# Patient Record
Sex: Female | Born: 1985
Health system: Southern US, Community
[De-identification: ages and names within clinical notes are randomized; demographics above are authoritative.]

## PROBLEM LIST (undated history)

## (undated) DIAGNOSIS — K219 Gastro-esophageal reflux disease without esophagitis: Secondary | ICD-10-CM

## (undated) DIAGNOSIS — E7800 Pure hypercholesterolemia, unspecified: Secondary | ICD-10-CM

## (undated) DIAGNOSIS — A0472 Enterocolitis due to Clostridium difficile, not specified as recurrent: Secondary | ICD-10-CM

## (undated) DIAGNOSIS — Z87442 Personal history of urinary calculi: Secondary | ICD-10-CM

## (undated) DIAGNOSIS — R519 Headache, unspecified: Secondary | ICD-10-CM

## (undated) DIAGNOSIS — I10 Essential (primary) hypertension: Secondary | ICD-10-CM

## (undated) DIAGNOSIS — R634 Abnormal weight loss: Secondary | ICD-10-CM

## (undated) DIAGNOSIS — F909 Attention-deficit hyperactivity disorder, unspecified type: Secondary | ICD-10-CM

## (undated) DIAGNOSIS — B029 Zoster without complications: Secondary | ICD-10-CM

## (undated) DIAGNOSIS — F419 Anxiety disorder, unspecified: Secondary | ICD-10-CM

## (undated) DIAGNOSIS — F32A Depression, unspecified: Secondary | ICD-10-CM

## (undated) HISTORY — DX: Headache, unspecified: R51.9

## (undated) HISTORY — DX: Abnormal weight loss: R63.4

---

## 2004-07-10 ENCOUNTER — Ambulatory Visit: Payer: Self-pay | Admitting: Obstetrics and Gynecology

## 2004-09-13 ENCOUNTER — Ambulatory Visit: Payer: Self-pay

## 2004-12-09 ENCOUNTER — Ambulatory Visit: Payer: Self-pay | Admitting: Family Medicine

## 2004-12-24 ENCOUNTER — Ambulatory Visit: Payer: Self-pay | Admitting: Pain Medicine

## 2007-03-23 ENCOUNTER — Ambulatory Visit: Payer: Self-pay | Admitting: Pain Medicine

## 2007-04-08 ENCOUNTER — Ambulatory Visit: Payer: Self-pay | Admitting: Physician Assistant

## 2007-04-22 ENCOUNTER — Ambulatory Visit: Payer: Self-pay | Admitting: Physician Assistant

## 2007-05-25 ENCOUNTER — Ambulatory Visit: Payer: Self-pay | Admitting: Physician Assistant

## 2009-02-08 ENCOUNTER — Ambulatory Visit: Payer: Self-pay | Admitting: Family Medicine

## 2013-03-17 ENCOUNTER — Emergency Department: Payer: Self-pay | Admitting: Emergency Medicine

## 2013-11-21 ENCOUNTER — Ambulatory Visit: Payer: Self-pay | Admitting: Unknown Physician Specialty

## 2014-08-15 ENCOUNTER — Ambulatory Visit: Payer: Self-pay | Admitting: Family Medicine

## 2015-01-29 ENCOUNTER — Other Ambulatory Visit: Payer: Self-pay | Admitting: Family Medicine

## 2015-05-30 ENCOUNTER — Encounter: Payer: Self-pay | Admitting: Emergency Medicine

## 2015-05-30 ENCOUNTER — Ambulatory Visit
Admission: EM | Admit: 2015-05-30 | Discharge: 2015-05-30 | Disposition: A | Discharge status: Home / Self Care | Payer: BLUE CROSS/BLUE SHIELD | Attending: Family Medicine | Admitting: Family Medicine

## 2015-05-30 DIAGNOSIS — M545 Low back pain, unspecified: Secondary | ICD-10-CM

## 2015-05-30 DIAGNOSIS — M461 Sacroiliitis, not elsewhere classified: Secondary | ICD-10-CM | POA: Diagnosis not present

## 2015-05-30 MED ORDER — KETOROLAC TROMETHAMINE 60 MG/2ML IM SOLN
60.0000 mg | Freq: Once | INTRAMUSCULAR | Status: AC
  Administered 2015-05-30: 60 mg via INTRAMUSCULAR

## 2015-05-30 MED ORDER — HYDROCODONE-ACETAMINOPHEN 5-325 MG PO TABS: ORAL_TABLET | ORAL | Status: DC

## 2015-05-30 MED ORDER — DIAZEPAM 2 MG PO TABS: ORAL_TABLET | ORAL | Status: DC

## 2015-05-30 NOTE — ED Notes (Signed)
Patient states that she "threw her back out"  Yesterday while blowing her nose.  Patient c/o lower back pain on her left side.

## 2015-05-30 NOTE — ED Provider Notes (Signed)
CSN: [id number]     Arrival date & time 05/30/15  [id number] History   None    Chief Complaint  Patient presents with  . Back Pain   (Consider location/radiation/quality/duration/timing/severity/associated sxs/prior Treatment) HPI Comments: 30 yo female with a h/o intermittent low back pain and sacroiliitis presents with a 1 day h/o acute onset of left low back pain that started while blowing her nose very hard yesterday. Patient has had similar acute episodes in the past causing her flare ups. States pain radiates down the left buttock area. Denies any saddle anesthesia, bowel or bladder problems.   Patient is a 30 y.o. female presenting with back pain. The history is provided by the patient.  Back Pain   History reviewed. No pertinent past medical history. History reviewed. No pertinent past surgical history. History reviewed. No pertinent family history. Social History  Substance Use Topics  . Smoking status: Never Smoker   . Smokeless tobacco: Never Used  . Alcohol Use: No   OB History    No data available     Review of Systems  Musculoskeletal: Positive for back pain.    Allergies  Shellfish allergy  Home Medications   Prior to Admission medications   Medication Sig Start Date End Date Taking? Authorizing Provider  amphetamine-dextroamphetamine (ADDERALL XR) 20 MG 24 hr capsule Take 20 mg by mouth daily.   Yes Historical Provider, MD  diazepam (VALIUM) 2 MG tablet 1 tab po q 8 hours prn 05/30/15   Norval Gable, MD  HYDROcodone-acetaminophen (NORCO/VICODIN) 5-325 MG tablet 1-2 tabs po q 8 hours prn 05/30/15   Norval Gable, MD  NUVARING 0.12-0.015 MG/24HR vaginal ring AS DIRECTED BY DOCTOR 01/29/15   Juline Patch, MD   Meds Ordered and Administered this Visit   Medications  ketorolac (TORADOL) injection 60 mg (60 mg Intramuscular Given 05/30/15 1042)    BP 144/97 mmHg  Pulse 120  Temp(Src) 99 F (37.2 C) (Tympanic)  Resp 16  Ht 5\' 11"  (1.803 m)  Wt 200 lb (90.719  kg)  BMI 27.91 kg/m2  SpO2 97% No data found.   Physical Exam  Constitutional: She appears well-developed and well-nourished. No distress.  Musculoskeletal: She exhibits tenderness. She exhibits no edema.       Lumbar back: She exhibits tenderness (over the left lumbar sacral paraspinous muscles, left buttock and left SI joint) and spasm. She exhibits normal range of motion, no bony tenderness, no swelling, no edema, no deformity, no laceration, no pain and normal pulse.  Neurological: She is alert. She has normal reflexes. She exhibits normal muscle tone.  Skin: Skin is warm and dry. No rash noted. She is not diaphoretic. No erythema.  Nursing note and vitals reviewed.   ED Course  Procedures (including critical care time)  Labs Review Labs Reviewed - No data to display  Imaging Review No results found.   Visual Acuity Review  Right Eye Distance:   Left Eye Distance:   Bilateral Distance:    Right Eye Near:   Left Eye Near:    Bilateral Near:         MDM   1. Left-sided low back pain without sciatica   2. Sacroiliitis [facility])    Discharge Medication List as of 05/30/2015 11:01 AM    START taking these medications   Details  diazepam (VALIUM) 2 MG tablet 1 tab po q 8 hours prn, Print    HYDROcodone-acetaminophen (NORCO/VICODIN) 5-325 MG tablet 1-2 tabs po q 8 hours prn, Print  1. diagnosis reviewed with patient 2. rx as per orders above; reviewed possible side effects, interactions, risks and benefits; patient also to take prednisone rx  she has at home 3. Recommend supportive treatment with gentle stretches, heat 4. Follow-up prn if symptoms worsen or don't improve    Norval Gable, MD 05/30/15 1104

## 2015-06-28 ENCOUNTER — Encounter: Payer: Self-pay | Admitting: Family Medicine

## 2015-06-28 ENCOUNTER — Ambulatory Visit (INDEPENDENT_AMBULATORY_CARE_PROVIDER_SITE_OTHER): Payer: BLUE CROSS/BLUE SHIELD | Admitting: Family Medicine

## 2015-06-28 VITALS — BP 120/80 | HR 72

## 2015-06-28 DIAGNOSIS — M5417 Radiculopathy, lumbosacral region: Secondary | ICD-10-CM | POA: Diagnosis not present

## 2015-06-28 NOTE — Progress Notes (Signed)
Name: Molly Cisneros   MRN: [id number]    DOB: 11/18/1985   Date:06/28/2015       Progress Note  Subjective  Chief Complaint  No chief complaint on file.   Back Pain This is a recurrent problem. The current episode started more than 1 month ago (slipped on ice 05/28/15). The problem occurs constantly. The problem has been gradually worsening since onset. The pain is present in the lumbar spine and sacro-iliac. The quality of the pain is described as aching. The pain radiates to the left foot, left knee and left thigh. The pain is at a severity of 8/10. The pain is moderate. The pain is the same all the time. The symptoms are aggravated by bending, coughing, position, sitting and twisting. Associated symptoms include tingling. Pertinent negatives include no abdominal pain, bladder incontinence, bowel incontinence, chest pain, dysuria, fever, headaches, numbness, paresis, weakness or weight loss. She has tried analgesics, muscle relaxant and NSAIDs for the symptoms. The treatment provided no relief.    No problem-specific assessment & plan notes found for this encounter.   No past medical history on file.  No past surgical history on file.  No family history on file.  Social History   Social History  . Marital Status: Single    Spouse Name: N/A  . Number of Children: N/A  . Years of Education: N/A   Occupational History  . Not on file.   Social History Main Topics  . Smoking status: Never Smoker   . Smokeless tobacco: Never Used  . Alcohol Use: No  . Drug Use: Not on file  . Sexual Activity: Not on file   Other Topics Concern  . Not on file   Social History Narrative    Allergies  Allergen Reactions  . Shellfish Allergy Anaphylaxis     Review of Systems  Constitutional: Negative for fever, chills, weight loss and malaise/fatigue.  HENT: Negative for ear discharge, ear pain and sore throat.   Eyes: Negative for blurred vision.  Respiratory: Negative for cough,  sputum production, shortness of breath and wheezing.   Cardiovascular: Negative for chest pain, palpitations and leg swelling.  Gastrointestinal: Negative for heartburn, nausea, abdominal pain, diarrhea, constipation, blood in stool, melena and bowel incontinence.  Genitourinary: Negative for bladder incontinence, dysuria, urgency, frequency and hematuria.  Musculoskeletal: Positive for back pain and falls. Negative for myalgias, joint pain and neck pain.  Skin: Negative for rash.  Neurological: Positive for tingling. Negative for dizziness, sensory change, focal weakness, weakness, numbness and headaches.  Endo/Heme/Allergies: Negative for environmental allergies and polydipsia. Does not bruise/bleed easily.  Psychiatric/Behavioral: Negative for depression and suicidal ideas. The patient is not nervous/anxious and does not have insomnia.      Objective  Filed Vitals:   06/28/15 0805  BP: 120/80  Pulse: 72    Physical Exam  Constitutional: She is well-developed, well-nourished, and in no distress. No distress.  HENT:  Head: Normocephalic and atraumatic.  Right Ear: External ear normal.  Left Ear: External ear normal.  Nose: Nose normal.  Mouth/Throat: Oropharynx is clear and moist.  Eyes: Conjunctivae and EOM are normal. Pupils are equal, round, and reactive to light. Right eye exhibits no discharge. Left eye exhibits no discharge.  Neck: Normal range of motion. Neck supple. No JVD present. No thyromegaly present.  Cardiovascular: Normal rate, regular rhythm, normal heart sounds and intact distal pulses.  Exam reveals no gallop and no friction rub.   No murmur heard. Pulmonary/Chest: Effort normal and  breath sounds normal.  Abdominal: Soft. Bowel sounds are normal. She exhibits no mass. There is no tenderness. There is no guarding.  Musculoskeletal: Normal range of motion. She exhibits no edema.       Lumbar back: She exhibits tenderness and spasm.       Back:  Tender left  sacroiliac  Lymphadenopathy:    She has no cervical adenopathy.  Neurological: She is alert. She has normal sensation, normal strength and normal reflexes. She displays abnormal stance. She has an abnormal Straight Leg Raise Test. Gait abnormal.  Skin: Skin is warm and dry. She is not diaphoretic.  Psychiatric: Mood and affect normal.  Nursing note and vitals reviewed.     Assessment & Plan  Problem List Items Addressed This Visit    None    Visit Diagnoses    Lumbosacral radiculopathy due to intervertebral disc disorder    -  Primary    Relevant Orders    DG Lumbar Spine Complete         Dr. Otilio Miu [facility]  06/28/2015

## 2015-06-29 ENCOUNTER — Ambulatory Visit
Admission: RE | Admit: 2015-06-29 | Discharge: 2015-06-29 | Disposition: A | Discharge status: Home / Self Care | Payer: BLUE CROSS/BLUE SHIELD | Source: Ambulatory Visit | Attending: Family Medicine | Admitting: Family Medicine

## 2015-06-29 DIAGNOSIS — M5136 Other intervertebral disc degeneration, lumbar region: Secondary | ICD-10-CM | POA: Diagnosis not present

## 2015-06-29 DIAGNOSIS — M5417 Radiculopathy, lumbosacral region: Secondary | ICD-10-CM | POA: Diagnosis present

## 2015-07-23 ENCOUNTER — Other Ambulatory Visit: Payer: Self-pay

## 2015-07-23 DIAGNOSIS — G8929 Other chronic pain: Secondary | ICD-10-CM | POA: Insufficient documentation

## 2015-07-23 DIAGNOSIS — M545 Low back pain: Secondary | ICD-10-CM

## 2015-07-24 ENCOUNTER — Other Ambulatory Visit: Payer: Self-pay | Admitting: Unknown Physician Specialty

## 2015-07-24 DIAGNOSIS — M545 Low back pain, unspecified: Secondary | ICD-10-CM

## 2015-07-24 DIAGNOSIS — G8929 Other chronic pain: Secondary | ICD-10-CM

## 2015-08-03 ENCOUNTER — Ambulatory Visit: Payer: BLUE CROSS/BLUE SHIELD

## 2015-08-09 ENCOUNTER — Ambulatory Visit
Admission: RE | Admit: 2015-08-09 | Discharge: 2015-08-09 | Disposition: A | Discharge status: Home / Self Care | Payer: BLUE CROSS/BLUE SHIELD | Source: Ambulatory Visit | Attending: Unknown Physician Specialty | Admitting: Unknown Physician Specialty

## 2015-08-09 DIAGNOSIS — G8929 Other chronic pain: Secondary | ICD-10-CM | POA: Insufficient documentation

## 2015-08-09 DIAGNOSIS — M4806 Spinal stenosis, lumbar region: Secondary | ICD-10-CM | POA: Diagnosis not present

## 2015-08-09 DIAGNOSIS — M5136 Other intervertebral disc degeneration, lumbar region: Secondary | ICD-10-CM | POA: Diagnosis not present

## 2015-08-09 DIAGNOSIS — M545 Low back pain: Secondary | ICD-10-CM | POA: Diagnosis present

## 2015-10-02 ENCOUNTER — Telehealth: Payer: Self-pay

## 2015-10-02 ENCOUNTER — Other Ambulatory Visit: Payer: Self-pay

## 2015-10-02 NOTE — Telephone Encounter (Signed)
Resume valtrex/ prednisone/  Sample lyrica 7.5 bid

## 2015-10-02 NOTE — Telephone Encounter (Signed)
Sent to [facility]

## 2015-12-27 ENCOUNTER — Other Ambulatory Visit: Payer: Self-pay

## 2016-08-18 ENCOUNTER — Ambulatory Visit: Payer: Self-pay | Admitting: Obstetrics and Gynecology

## 2016-09-24 ENCOUNTER — Other Ambulatory Visit: Payer: Self-pay

## 2016-09-24 MED ORDER — HYDROCHLOROTHIAZIDE 12.5 MG PO CAPS: 12.5000 mg | ORAL_CAPSULE | Freq: Every day | ORAL | 0 refills | Status: DC

## 2016-10-02 ENCOUNTER — Ambulatory Visit: Payer: Self-pay | Admitting: Obstetrics and Gynecology

## 2016-10-07 ENCOUNTER — Ambulatory Visit (INDEPENDENT_AMBULATORY_CARE_PROVIDER_SITE_OTHER): Payer: BLUE CROSS/BLUE SHIELD | Admitting: Obstetrics and Gynecology

## 2016-10-07 ENCOUNTER — Encounter: Payer: Self-pay | Admitting: Obstetrics and Gynecology

## 2016-10-07 DIAGNOSIS — Z124 Encounter for screening for malignant neoplasm of cervix: Secondary | ICD-10-CM | POA: Diagnosis not present

## 2016-10-07 DIAGNOSIS — Z01419 Encounter for gynecological examination (general) (routine) without abnormal findings: Secondary | ICD-10-CM | POA: Diagnosis not present

## 2016-10-07 LAB — RESULTS CONSOLE HPV: CHL HPV: NEGATIVE

## 2016-10-07 MED ORDER — ETONOGESTREL-ETHINYL ESTRADIOL 0.12-0.015 MG/24HR VA RING: VAGINAL_RING | VAGINAL | 3 refills | Status: DC

## 2016-10-07 NOTE — Patient Instructions (Signed)
Preventive Care 18-39 Years, Female Preventive care refers to lifestyle choices and visits with your health care provider that can promote health and wellness. What does preventive care include?  A yearly physical exam. This is also called an annual well check.  Dental exams once or twice a year.  Routine eye exams. Ask your health care provider how often you should have your eyes checked.  Personal lifestyle choices, including:  Daily care of your teeth and gums.  Regular physical activity.  Eating a healthy diet.  Avoiding tobacco and drug use.  Limiting alcohol use.  Practicing safe sex.  Taking vitamin and mineral supplements as recommended by your health care provider. What happens during an annual well check? The services and screenings done by your health care provider during your annual well check will depend on your age, overall health, lifestyle risk factors, and family history of disease. Counseling  Your health care provider may ask you questions about your:  Alcohol use.  Tobacco use.  Drug use.  Emotional well-being.  Home and relationship well-being.  Sexual activity.  Eating habits.  Work and work environment.  Method of birth control.  Menstrual cycle.  Pregnancy history. Screening  You may have the following tests or measurements:  Height, weight, and BMI.  Diabetes screening. This is done by checking your blood sugar (glucose) after you have not eaten for a while (fasting).  Blood pressure.  Lipid and cholesterol levels. These may be checked every 5 years starting at age 20.  Skin check.  Hepatitis C blood test.  Hepatitis B blood test.  Sexually transmitted disease (STD) testing.  BRCA-related cancer screening. This may be done if you have a family history of breast, ovarian, tubal, or peritoneal cancers.  Pelvic exam and Pap test. This may be done every 3 years starting at age 21. Starting at age 30, this may be done every 5  years if you have a Pap test in combination with an HPV test. Discuss your test results, treatment options, and if necessary, the need for more tests with your health care provider. Vaccines  Your health care provider may recommend certain vaccines, such as:  Influenza vaccine. This is recommended every year.  Tetanus, diphtheria, and acellular pertussis (Tdap, Td) vaccine. You may need a Td booster every 10 years.  Varicella vaccine. You may need this if you have not been vaccinated.  HPV vaccine. If you are 26 or younger, you may need three doses over 6 months.  Measles, mumps, and rubella (MMR) vaccine. You may need at least one dose of MMR. You may also need a second dose.  Pneumococcal 13-valent conjugate (PCV13) vaccine. You may need this if you have certain conditions and were not previously vaccinated.  Pneumococcal polysaccharide (PPSV23) vaccine. You may need one or two doses if you smoke cigarettes or if you have certain conditions.  Meningococcal vaccine. One dose is recommended if you are age 19-21 years and a first-year college student living in a residence hall, or if you have one of several medical conditions. You may also need additional booster doses.  Hepatitis A vaccine. You may need this if you have certain conditions or if you travel or work in places where you may be exposed to hepatitis A.  Hepatitis B vaccine. You may need this if you have certain conditions or if you travel or work in places where you may be exposed to hepatitis B.  Haemophilus influenzae type b (Hib) vaccine. You may need this   if you have certain risk factors. Talk to your health care provider about which screenings and vaccines you need and how often you need them. This information is not intended to replace advice given to you by your health care provider. Make sure you discuss any questions you have with your health care provider. Document Released: 07/01/2001 Document Revised: 01/23/2016  Document Reviewed: 03/06/2015 Elsevier Interactive Patient Education  2017 [employer].

## 2016-10-07 NOTE — Progress Notes (Signed)
Patient ID: Molly Cisneros, female   DOB: May 12, 1986, 31 y.o.   MRN: [id number]     Gynecology Annual Exam  PCP: Juline Patch, MD  Chief Complaint:  Chief Complaint  Patient presents with  . Gynecologic Exam    History of Present Illness: Patient is a 31 y.o. G0P0000 presents for annual exam. The patient has no complaints today.   LMP: Patient's last menstrual period was 08/27/2016. Average Interval: regular, 28 days Duration of flow: 5 days Heavy Menses: no Clots: no Intermenstrual Bleeding: no Postcoital Bleeding: not applicable Dysmenorrhea: no  The patient is not currently sexually active. She currently uses nuvaring for contraception. The patient does perform self breast exams.  There is no notable family history of breast or ovarian cancer in her family.  The patient wears seatbelts: yes.   The patient has regular exercise: no.    Review of Systems: Review of Systems  Constitutional: Negative for chills and fever.  HENT: Negative for congestion.   Respiratory: Negative for cough and shortness of breath.   Cardiovascular: Negative for chest pain and palpitations.  Gastrointestinal: Negative for abdominal pain, constipation, diarrhea, heartburn, nausea and vomiting.  Genitourinary: Negative for dysuria, frequency and urgency.  Skin: Negative for itching and rash.  Neurological: Negative for dizziness and headaches.  Endo/Heme/Allergies: Negative for polydipsia.  Psychiatric/Behavioral: Negative for depression.    Past Medical History:  History reviewed. No pertinent past medical history.  Past Surgical History:  History reviewed. No pertinent surgical history.  Gynecologic History:  Patient's last menstrual period was 08/27/2016. Contraception: NuvaRing vaginal inserts Last Pap: Results were: not available for review but history of prior abnormals with follow up colposcopies last pap 6 years ago   Obstetric History: G0P0000  Family History:  Family  History  Problem Relation Age of Onset  . Non-Hodgkin's lymphoma Father 88       Basil Cell  . Pancreatic cancer Maternal Grandmother 8  . Thyroid cancer Maternal Grandmother 11  . Throat cancer Paternal Grandfather 99    Social History:  Social History   Social History  . Marital status: Single    Spouse name: N/A  . Number of children: N/A  . Years of education: N/A   Occupational History  . Not on file.   Social History Main Topics  . Smoking status: Never Smoker  . Smokeless tobacco: Never Used  . Alcohol use Yes  . Drug use: No  . Sexual activity: Not Currently    Birth control/ protection: Inserts   Other Topics Concern  . Not on file   Social History Narrative  . No narrative on file    Allergies:  Allergies  Allergen Reactions  . Shellfish Allergy Anaphylaxis    Medications: Prior to Admission medications   Medication Sig Start Date End Date Taking? Authorizing Provider  ALPRAZolam Duanne Moron) 1 MG tablet Take 1 mg by mouth at bedtime as needed for anxiety.   Yes [provider]  amphetamine-dextroamphetamine (ADDERALL XR) 20 MG 24 hr capsule Take 20 mg by mouth daily.   Yes [provider]  DULoxetine (CYMBALTA) 60 MG capsule  07/18/15  Yes [provider]  hydrochlorothiazide (MICROZIDE) 12.5 MG capsule Take 1 capsule (12.5 mg total) by mouth daily. 09/24/16  Yes Juline Patch, MD  NUVARING 0.12-0.015 MG/24HR vaginal ring AS DIRECTED BY DOCTOR 01/29/15  Yes Juline Patch, MD  valACYclovir (VALTREX) 1000 MG tablet  05/09/15  Yes [provider]    Physical Exam  Vitals: Blood pressure 134/90, pulse (!) 125, height 5' 11.5" (1.816 m), weight 242 lb (109.8 kg), last menstrual period 08/27/2016.  General: NAD HEENT: normocephalic, anicteric Thyroid: no enlargement, no palpable nodules Pulmonary: No increased work of breathing, CTAB Cardiovascular: RRR, distal pulses 2+ Breast: Breast symmetrical, no tenderness, no  palpable nodules or masses, no skin or nipple retraction present, no nipple discharge.  No axillary or supraclavicular lymphadenopathy. Abdomen: NABS, soft, non-tender, non-distended.  Umbilicus without lesions.  No hepatomegaly, splenomegaly or masses palpable. No evidence of hernia  Genitourinary:  External: Normal external female genitalia.  Normal urethral meatus, normal  Bartholin's and Skene's glands.    Vagina: Normal vaginal mucosa, no evidence of prolapse.    Cervix: Grossly normal in appearance, no bleeding  Uterus: Non-enlarged, mobile, normal contour.  No CMT  Adnexa: ovaries non-enlarged, no adnexal masses  Rectal: deferred  Lymphatic: no evidence of inguinal lymphadenopathy Extremities: no edema, erythema, or tenderness Neurologic: Grossly intact Psychiatric: mood appropriate, affect full  Female chaperone present for pelvic and breast  portions of the physical exam    Assessment: 31 y.o. G0P0000 No problem-specific Assessment & Plan notes found for this encounter.   Plan: Problem List Items Addressed This Visit    None    Visit Diagnoses    Screening for malignant neoplasm of cervix       Relevant Orders   PapIG, HPV, rfx 16/18   Encounter for gynecological examination without abnormal finding       Relevant Orders   PapIG, HPV, rfx 16/18      1) STI screening was not offered as not currently sexually active  2) ASCCP guidelines and rational discussed.  Patient opts for every 3 years screening interval  3) Contraception - Education given regarding options for contraception, including NuvaRing.  We discused WHO and CDC recommendation on combined OCP use in setting of HTN.  Initiation not being the same as continuation.  However, if BP elevate I suggested switching to a progestin only options, this would be reasonable at present in either pill form, depo provera, IUD or nexplanon.  Patient wishes to continue NuvaRing at present  4) Routine healthcare maintenance  including cholesterol, diabetes screening discussed managed by PCP  5) Follow up 1 year for routine annual exam

## 2016-10-09 ENCOUNTER — Other Ambulatory Visit: Payer: Self-pay | Admitting: Family Medicine

## 2016-10-09 LAB — PAPIG, HPV, RFX 16/18
HPV, HIGH-RISK: NEGATIVE
PAP Smear Comment: 0

## 2016-10-30 ENCOUNTER — Other Ambulatory Visit: Payer: Self-pay

## 2016-10-30 MED ORDER — HYDROCHLOROTHIAZIDE 25 MG PO TABS: 25.0000 mg | ORAL_TABLET | Freq: Every day | ORAL | 1 refills | Status: DC

## 2016-11-07 ENCOUNTER — Other Ambulatory Visit: Payer: Self-pay

## 2016-11-07 DIAGNOSIS — M542 Cervicalgia: Secondary | ICD-10-CM

## 2016-11-07 MED ORDER — CYCLOBENZAPRINE HCL 10 MG PO TABS: 10.0000 mg | ORAL_TABLET | Freq: Three times a day (TID) | ORAL | 0 refills | Status: DC | PRN

## 2016-12-30 ENCOUNTER — Other Ambulatory Visit: Payer: Self-pay

## 2016-12-30 DIAGNOSIS — R059 Cough, unspecified: Secondary | ICD-10-CM

## 2016-12-30 DIAGNOSIS — R05 Cough: Secondary | ICD-10-CM

## 2016-12-30 MED ORDER — BENZONATATE 100 MG PO CAPS: 100.0000 mg | ORAL_CAPSULE | Freq: Two times a day (BID) | ORAL | 0 refills | Status: DC | PRN

## 2017-04-28 ENCOUNTER — Ambulatory Visit (INDEPENDENT_AMBULATORY_CARE_PROVIDER_SITE_OTHER): Payer: BLUE CROSS/BLUE SHIELD | Admitting: Internal Medicine

## 2017-04-28 ENCOUNTER — Encounter: Payer: Self-pay | Admitting: Internal Medicine

## 2017-04-28 VITALS — BP 178/122 | HR 111 | Ht 72.0 in | Wt 250.0 lb

## 2017-04-28 DIAGNOSIS — M5441 Lumbago with sciatica, right side: Secondary | ICD-10-CM

## 2017-04-28 MED ORDER — CYCLOBENZAPRINE HCL 10 MG PO TABS: 10.0000 mg | ORAL_TABLET | Freq: Three times a day (TID) | ORAL | 0 refills | Status: DC | PRN

## 2017-04-28 MED ORDER — HYDROCODONE-ACETAMINOPHEN 5-325 MG PO TABS: 1.0000 | ORAL_TABLET | Freq: Four times a day (QID) | ORAL | 0 refills | Status: DC | PRN

## 2017-04-28 NOTE — Progress Notes (Signed)
Date:  04/28/2017   Name:  Molly Cisneros   DOB:  1986/02/25   MRN:  [id number]   Chief Complaint: Back Pain (Slipped and fell on ice today. Lower back is hurting bad. ) Back Pain  This is a recurrent problem. The problem occurs constantly. The problem has been rapidly worsening (since falling today) since onset. The pain is present in the lumbar spine. The quality of the pain is described as aching, burning and cramping. The pain radiates to the right foot. The pain is severe. The symptoms are aggravated by sitting. Associated symptoms include numbness (in right foot). Pertinent negatives include no chest pain, fever, headaches or weakness. She has tried nothing for the symptoms.   She has hx of HNP at lumbar disc and has has 2 ESI - the last one about one year ago.   Review of Systems  Constitutional: Negative for chills, fatigue and fever.  Respiratory: Negative for chest tightness and shortness of breath.   Cardiovascular: Negative for chest pain and palpitations.  Genitourinary: Negative for difficulty urinating.  Musculoskeletal: Positive for arthralgias, back pain and gait problem.  Neurological: Positive for numbness (in right foot). Negative for dizziness, tremors, weakness and headaches.    There are no active problems to display for this patient.   Prior to Admission medications   Medication Sig Start Date End Date Taking? Authorizing Provider  ALPRAZolam Duanne Moron) 1 MG tablet Take 1 mg by mouth at bedtime as needed for anxiety.   Yes [provider]  amphetamine-dextroamphetamine (ADDERALL XR) 20 MG 24 hr capsule Take 20 mg by mouth daily.   Yes [provider]  DULoxetine (CYMBALTA) 60 MG capsule  07/18/15  Yes [provider]  etonogestrel-ethinyl estradiol (NUVARING) 0.12-0.015 MG/24HR vaginal ring AS DIRECTED BY DOCTOR 10/07/16  Yes Malachy Mood, MD  hydrochlorothiazide (HYDRODIURIL) 25 MG tablet Take 1 tablet (25 mg total) by mouth  daily. 10/30/16  Yes Juline Patch, MD  valACYclovir (VALTREX) 1000 MG tablet  05/09/15  Yes [provider]    Allergies  Allergen Reactions  . Shellfish Allergy Anaphylaxis    History reviewed. No pertinent surgical history.  Social History   Tobacco Use  . Smoking status: Never Smoker  . Smokeless tobacco: Never Used  Substance Use Topics  . Alcohol use: Yes  . Drug use: No     Medication list has been reviewed and updated.  No flowsheet data found.  Physical Exam  Constitutional: She is oriented to person, place, and time. She appears well-developed. No distress.  HENT:  Head: Normocephalic and atraumatic.  Pulmonary/Chest: Effort normal. No respiratory distress.  Musculoskeletal: Normal range of motion.       Lumbar back: She exhibits tenderness and spasm.  Neurological: She is alert and oriented to person, place, and time.  Skin: Skin is warm and dry. No rash noted.  Psychiatric: She has a normal mood and affect. Her speech is normal and behavior is normal. Thought content normal.  Nursing note and vitals reviewed.   BP (!) 178/122   Pulse (!) 111   Ht 6' (1.829 m)   Wt 250 lb (113.4 kg)   SpO2 97%   BMI 33.91 kg/m   Assessment and Plan: 1. Acute right-sided low back pain with right-sided sciatica Use ice Access tomorrow if need for specialist evaluation again - HYDROcodone-acetaminophen (NORCO/VICODIN) 5-325 MG tablet; Take 1 tablet by mouth every 6 (six) hours as needed for moderate pain.  Dispense: 20 tablet;  Refill: 0 - cyclobenzaprine (FLEXERIL) 10 MG tablet; Take 1 tablet (10 mg total) by mouth 3 (three) times daily as needed for muscle spasms.  Dispense: 30 tablet; Refill: 0   Meds ordered this encounter  Medications  . HYDROcodone-acetaminophen (NORCO/VICODIN) 5-325 MG tablet    Sig: Take 1 tablet by mouth every 6 (six) hours as needed for moderate pain.    Dispense:  20 tablet    Refill:  0  . cyclobenzaprine (FLEXERIL) 10 MG  tablet    Sig: Take 1 tablet (10 mg total) by mouth 3 (three) times daily as needed for muscle spasms.    Dispense:  30 tablet    Refill:  0    Partially dictated using Editor, commissioning. Any errors are unintentional.  Halina Maidens, MD [facility]  04/28/2017

## 2017-05-25 ENCOUNTER — Other Ambulatory Visit: Payer: Self-pay

## 2017-05-25 DIAGNOSIS — M5441 Lumbago with sciatica, right side: Secondary | ICD-10-CM

## 2017-05-25 MED ORDER — CYCLOBENZAPRINE HCL 10 MG PO TABS: 10.0000 mg | ORAL_TABLET | Freq: Three times a day (TID) | ORAL | 0 refills | Status: DC | PRN

## 2017-05-26 ENCOUNTER — Other Ambulatory Visit
Admission: RE | Admit: 2017-05-26 | Discharge: 2017-05-26 | Disposition: A | Discharge status: Home / Self Care | Payer: BLUE CROSS/BLUE SHIELD | Source: Ambulatory Visit | Attending: Family Medicine | Admitting: Family Medicine

## 2017-05-26 DIAGNOSIS — D649 Anemia, unspecified: Secondary | ICD-10-CM | POA: Insufficient documentation

## 2017-05-26 DIAGNOSIS — I10 Essential (primary) hypertension: Secondary | ICD-10-CM | POA: Diagnosis present

## 2017-05-26 LAB — CBC WITH DIFFERENTIAL/PLATELET
Basophils Absolute: 0.1 10*3/uL (ref 0–0.1)
Basophils Relative: 1 %
Eosinophils Absolute: 0.2 10*3/uL (ref 0–0.7)
Eosinophils Relative: 2 %
HEMATOCRIT: 41.2 % (ref 35.0–47.0)
HEMOGLOBIN: 14.3 g/dL (ref 12.0–16.0)
LYMPHS ABS: 2.8 10*3/uL (ref 1.0–3.6)
Lymphocytes Relative: 33 %
MCH: 30.3 pg (ref 26.0–34.0)
MCHC: 34.6 g/dL (ref 32.0–36.0)
MCV: 87.6 fL (ref 80.0–100.0)
MONO ABS: 0.6 10*3/uL (ref 0.2–0.9)
MONOS PCT: 6 %
NEUTROS ABS: 5 10*3/uL (ref 1.4–6.5)
NEUTROS PCT: 58 %
Platelets: 322 10*3/uL (ref 150–440)
RBC: 4.71 MIL/uL (ref 3.80–5.20)
RDW: 12.9 % (ref 11.5–14.5)
WBC: 8.6 10*3/uL (ref 3.6–11.0)

## 2017-05-26 LAB — LIPID PANEL
Cholesterol: 266 mg/dL — ABNORMAL HIGH (ref 0–200)
HDL: 40 mg/dL — ABNORMAL LOW (ref >40)
LDL Cholesterol: 147 mg/dL — ABNORMAL HIGH (ref 0–99)
Total CHOL/HDL Ratio: 6.7 RATIO
Triglycerides: 394 mg/dL — ABNORMAL HIGH (ref <150)
VLDL: 79 mg/dL — AB (ref 0–40)

## 2017-05-29 ENCOUNTER — Other Ambulatory Visit: Payer: Self-pay

## 2017-05-29 MED ORDER — ATORVASTATIN CALCIUM 10 MG PO TABS: 10.0000 mg | ORAL_TABLET | Freq: Every day | ORAL | 1 refills | Status: DC

## 2017-06-05 DIAGNOSIS — M5126 Other intervertebral disc displacement, lumbar region: Secondary | ICD-10-CM | POA: Insufficient documentation

## 2017-06-05 DIAGNOSIS — E669 Obesity, unspecified: Secondary | ICD-10-CM | POA: Insufficient documentation

## 2017-06-16 ENCOUNTER — Other Ambulatory Visit: Payer: Self-pay | Admitting: Family Medicine

## 2017-06-16 ENCOUNTER — Ambulatory Visit
Admission: RE | Admit: 2017-06-16 | Discharge: 2017-06-16 | Disposition: A | Discharge status: Home / Self Care | Payer: BLUE CROSS/BLUE SHIELD | Source: Ambulatory Visit | Attending: Family Medicine | Admitting: Family Medicine

## 2017-06-16 DIAGNOSIS — R1113 Vomiting of fecal matter: Secondary | ICD-10-CM

## 2017-06-16 NOTE — Progress Notes (Signed)
Nausea and vomiting/  Foul smelling vomitis

## 2017-06-29 ENCOUNTER — Other Ambulatory Visit: Payer: Self-pay | Admitting: Family Medicine

## 2017-08-12 MED ORDER — MORPHINE SULFATE (PF) 4 MG/ML IV SOLN
4.00 | INTRAVENOUS | Status: DC
Start: ? — End: 2017-08-12

## 2017-09-01 ENCOUNTER — Other Ambulatory Visit: Payer: Self-pay

## 2017-09-01 MED ORDER — AZITHROMYCIN 250 MG PO TABS: ORAL_TABLET | ORAL | 0 refills | Status: DC

## 2017-10-26 ENCOUNTER — Other Ambulatory Visit: Payer: Self-pay | Admitting: Family Medicine

## 2017-10-26 ENCOUNTER — Other Ambulatory Visit: Payer: Self-pay | Admitting: Obstetrics and Gynecology

## 2017-11-02 ENCOUNTER — Other Ambulatory Visit: Payer: Self-pay | Admitting: Family Medicine

## 2017-11-25 ENCOUNTER — Other Ambulatory Visit: Payer: Self-pay

## 2017-11-25 MED ORDER — LISINOPRIL-HYDROCHLOROTHIAZIDE 10-12.5 MG PO TABS: 1.0000 | ORAL_TABLET | Freq: Every day | ORAL | 2 refills | Status: DC

## 2017-12-16 ENCOUNTER — Telehealth: Payer: Self-pay

## 2017-12-16 NOTE — Telephone Encounter (Signed)
Pt called in complaining of "severe heartburn with burping up whole pieces of food"- Tammi Klippel will see her tomorrow at 1:30 in [facility]

## 2018-01-18 IMAGING — CR DG LUMBAR SPINE COMPLETE 4+V
5 series · 5 of 5 positions shown · non-contrast
Comparison: February 08, 2009.

CLINICAL DATA: Left sacroiliac pain.

EXAM:
LUMBAR SPINE - COMPLETE 4+ VIEW

[l-spine ap]
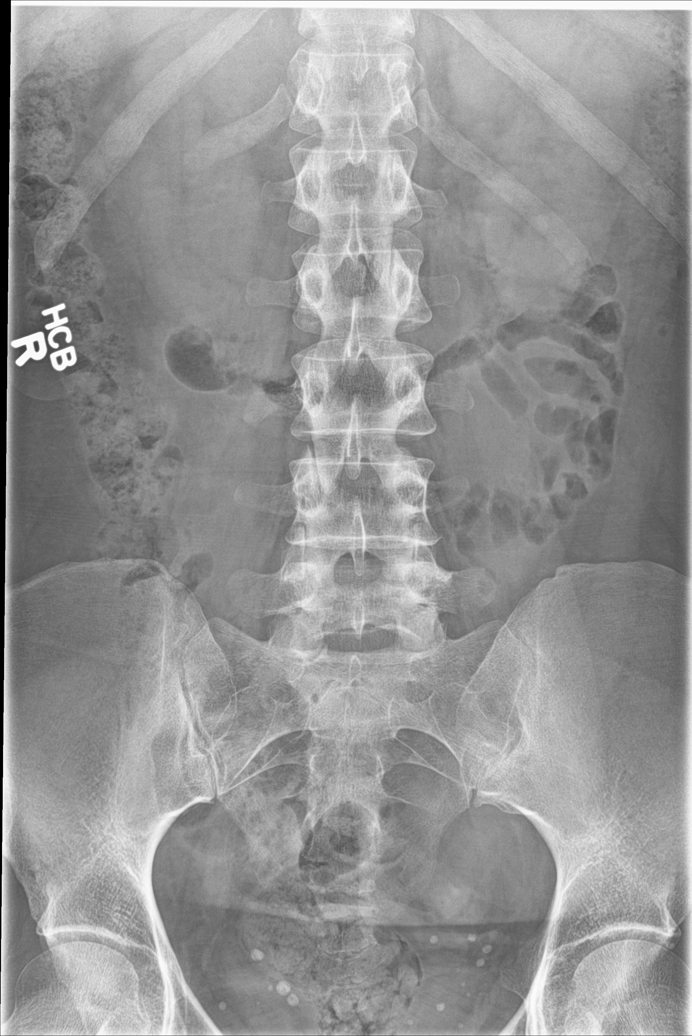

[l-spine obl (1 of 2)]
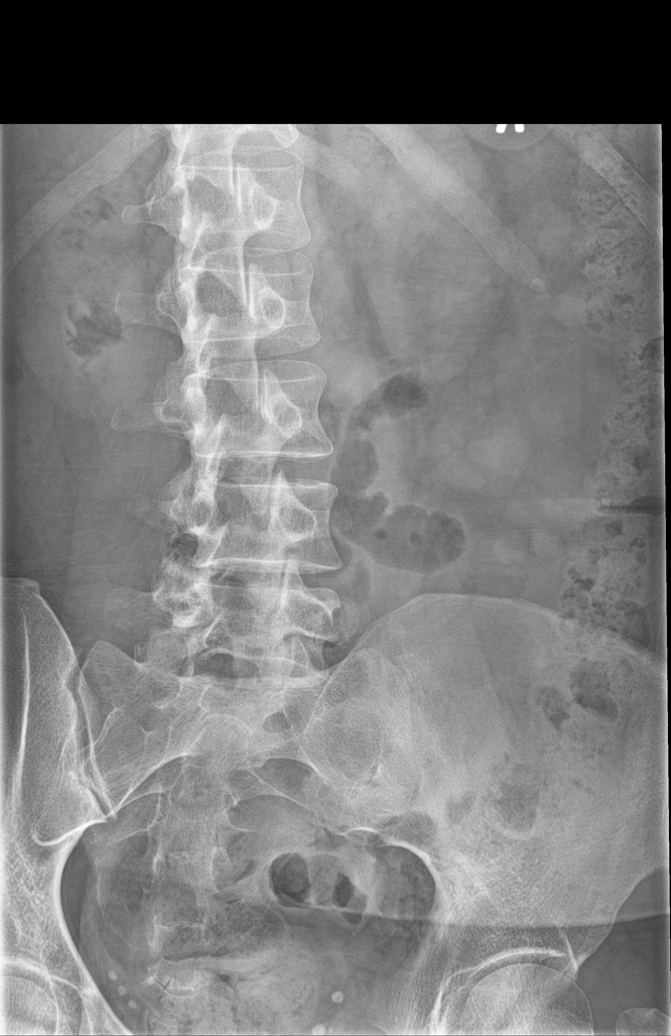

[l-spine obl (2 of 2)]
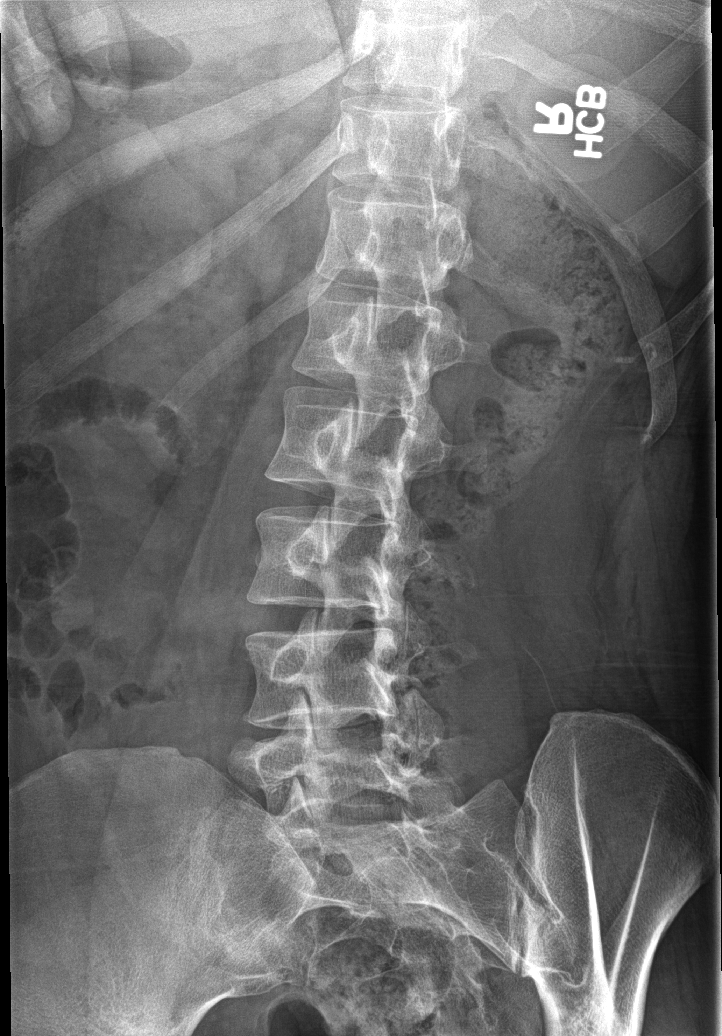

[l-spine lat]
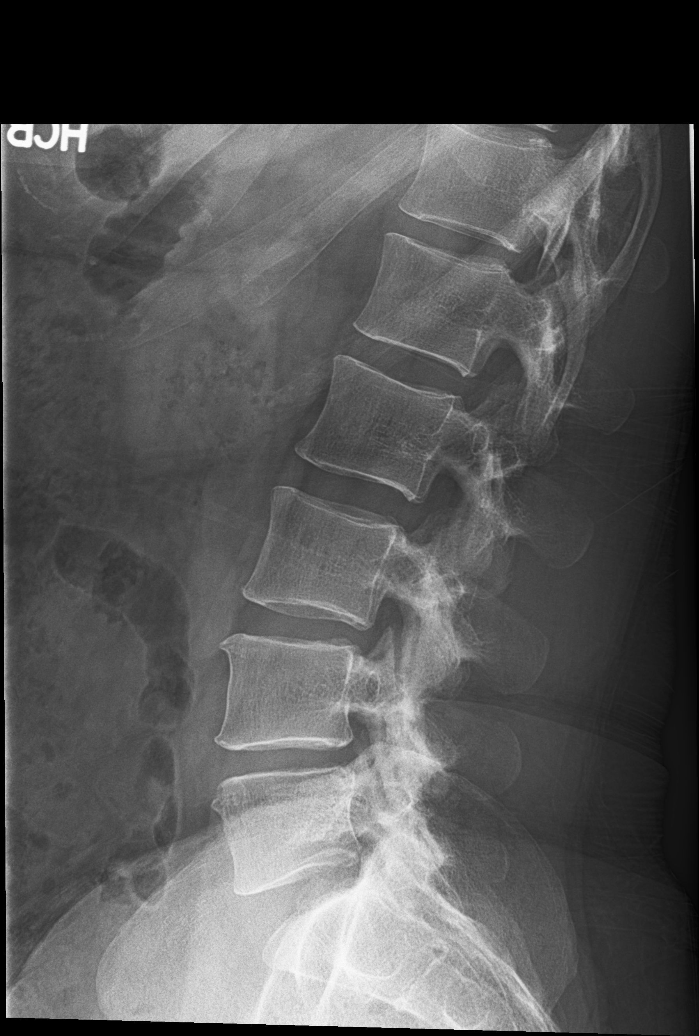

[l-spine spot]
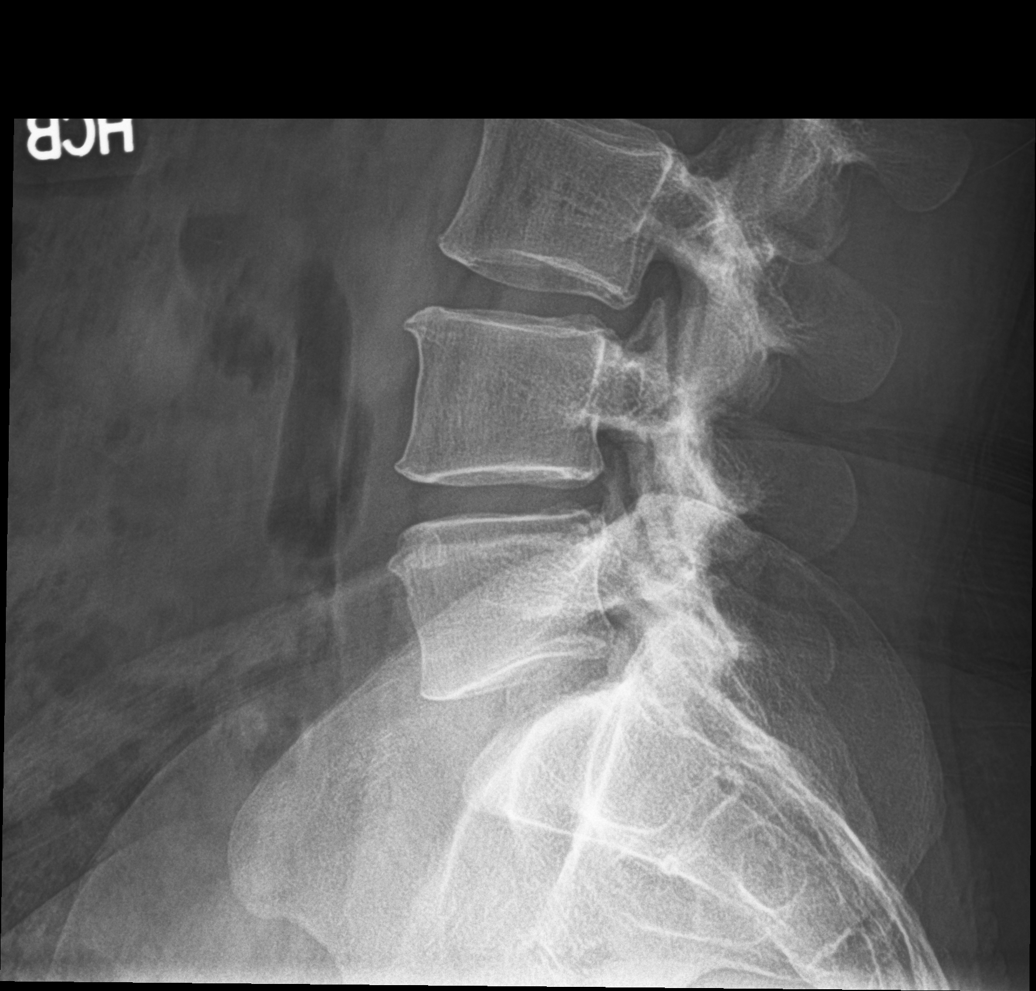

[5 of 5 positions shown; findings below may reference images not displayed]

FINDINGS: No fracture or spondylolisthesis is noted. Mild degenerative disc
disease is noted at L4-5. Remaining disc spaces and posterior facet
joints appear intact.
IMPRESSION: Mild degenerative disc disease is noted at L4-5. No acute
abnormality seen in the lumbar spine.

## 2018-02-03 ENCOUNTER — Other Ambulatory Visit: Payer: Self-pay | Admitting: Family Medicine

## 2018-02-03 MED ORDER — ATORVASTATIN CALCIUM 20 MG PO TABS: 20.0000 mg | ORAL_TABLET | Freq: Every day | ORAL | 1 refills | Status: DC

## 2018-02-10 ENCOUNTER — Other Ambulatory Visit: Payer: Self-pay | Admitting: Gastroenterology

## 2018-02-10 DIAGNOSIS — R1011 Right upper quadrant pain: Secondary | ICD-10-CM

## 2018-03-01 ENCOUNTER — Ambulatory Visit: Payer: BLUE CROSS/BLUE SHIELD

## 2018-03-15 ENCOUNTER — Encounter
Admission: RE | Admit: 2018-03-15 | Discharge: 2018-03-15 | Disposition: A | Discharge status: Home / Self Care | Payer: BLUE CROSS/BLUE SHIELD | Source: Ambulatory Visit | Attending: Gastroenterology | Admitting: Gastroenterology

## 2018-03-15 ENCOUNTER — Ambulatory Visit
Admission: RE | Admit: 2018-03-15 | Discharge: 2018-03-15 | Disposition: A | Discharge status: Home / Self Care | Payer: BLUE CROSS/BLUE SHIELD | Source: Ambulatory Visit | Attending: Gastroenterology | Admitting: Gastroenterology

## 2018-03-15 DIAGNOSIS — K7689 Other specified diseases of liver: Secondary | ICD-10-CM | POA: Diagnosis not present

## 2018-03-15 DIAGNOSIS — R1011 Right upper quadrant pain: Secondary | ICD-10-CM

## 2018-03-15 MED ORDER — TECHNETIUM TC 99M MEBROFENIN IV KIT
5.0000 | PACK | Freq: Once | INTRAVENOUS | Status: AC | PRN
  Administered 2018-03-15: 5.27 via INTRAVENOUS

## 2018-04-14 ENCOUNTER — Other Ambulatory Visit: Payer: Self-pay | Admitting: Internal Medicine

## 2018-04-14 ENCOUNTER — Other Ambulatory Visit
Admission: RE | Admit: 2018-04-14 | Discharge: 2018-04-14 | Disposition: A | Discharge status: Home / Self Care | Payer: BLUE CROSS/BLUE SHIELD | Source: Ambulatory Visit | Attending: Internal Medicine | Admitting: Internal Medicine

## 2018-04-14 DIAGNOSIS — K921 Melena: Secondary | ICD-10-CM | POA: Diagnosis present

## 2018-04-14 DIAGNOSIS — R197 Diarrhea, unspecified: Secondary | ICD-10-CM | POA: Insufficient documentation

## 2018-04-14 LAB — CBC WITH DIFFERENTIAL/PLATELET
Abs Immature Granulocytes: 0.01 10*3/uL (ref 0.00–0.07)
BASOS ABS: 0 10*3/uL (ref 0.0–0.1)
BASOS PCT: 1 %
Eosinophils Absolute: 0.1 10*3/uL (ref 0.0–0.5)
Eosinophils Relative: 2 %
HCT: 36 % (ref 36.0–46.0)
Hemoglobin: 12.2 g/dL (ref 12.0–15.0)
IMMATURE GRANULOCYTES: 0 %
Lymphocytes Relative: 39 %
Lymphs Abs: 2.7 10*3/uL (ref 0.7–4.0)
MCH: 29.1 pg (ref 26.0–34.0)
MCHC: 33.9 g/dL (ref 30.0–36.0)
MCV: 85.9 fL (ref 80.0–100.0)
MONOS PCT: 8 %
Monocytes Absolute: 0.6 10*3/uL (ref 0.1–1.0)
NEUTROS PCT: 50 %
NRBC: 0 % (ref 0.0–0.2)
Neutro Abs: 3.4 10*3/uL (ref 1.7–7.7)
PLATELETS: 371 10*3/uL (ref 150–400)
RBC: 4.19 MIL/uL (ref 3.87–5.11)
RDW: 13.1 % (ref 11.5–15.5)
WBC: 6.9 10*3/uL (ref 4.0–10.5)

## 2018-04-19 ENCOUNTER — Other Ambulatory Visit: Payer: Self-pay

## 2018-04-19 MED ORDER — PROMETHAZINE HCL 25 MG PO TABS: 25.0000 mg | ORAL_TABLET | Freq: Three times a day (TID) | ORAL | 0 refills | Status: DC | PRN

## 2018-04-19 MED ORDER — AZITHROMYCIN 250 MG PO TABS: ORAL_TABLET | ORAL | 0 refills | Status: DC

## 2018-05-18 ENCOUNTER — Ambulatory Visit: Payer: BLUE CROSS/BLUE SHIELD | Admitting: Internal Medicine

## 2018-05-18 ENCOUNTER — Encounter: Payer: Self-pay | Admitting: Internal Medicine

## 2018-05-18 VITALS — BP 128/86 | HR 100 | Temp 98.7°F | Ht 72.0 in | Wt 248.0 lb

## 2018-05-18 DIAGNOSIS — R05 Cough: Secondary | ICD-10-CM | POA: Diagnosis not present

## 2018-05-18 DIAGNOSIS — J0100 Acute maxillary sinusitis, unspecified: Secondary | ICD-10-CM | POA: Diagnosis not present

## 2018-05-18 DIAGNOSIS — R059 Cough, unspecified: Secondary | ICD-10-CM

## 2018-05-18 MED ORDER — GUAIFENESIN-CODEINE 100-10 MG/5ML PO SYRP: 5.0000 mL | ORAL_SOLUTION | Freq: Three times a day (TID) | ORAL | 0 refills | Status: AC | PRN

## 2018-05-18 MED ORDER — AZITHROMYCIN 250 MG PO TABS: ORAL_TABLET | ORAL | 0 refills | Status: AC

## 2018-05-18 NOTE — Progress Notes (Signed)
Date:  05/18/2018   Name:  Molly Cisneros   DOB:  May 05, 1986   MRN:  [id number]   Chief Complaint: Cough (Chest congestion. No production. Cough wiht sore throat. Fever - and body aches. )  Sore Throat   This is a new problem. The current episode started yesterday. The problem has been gradually worsening. The pain is worse on the right side. The maximum temperature recorded prior to her arrival was 100.4 - 100.9 F. Associated symptoms include coughing and ear pain. Pertinent negatives include no abdominal pain, diarrhea, headaches or shortness of breath.    Review of Systems  Constitutional: Positive for chills and fatigue. Negative for fever.  HENT: Positive for ear pain and sore throat.   Respiratory: Positive for cough. Negative for chest tightness, shortness of breath and wheezing.   Cardiovascular: Negative for chest pain and palpitations.  Gastrointestinal: Negative for abdominal pain, constipation and diarrhea.  Skin: Negative for rash.  Neurological: Negative for dizziness, light-headedness and headaches.  Psychiatric/Behavioral: Negative for sleep disturbance.    Patient Active Problem List   Diagnosis Date Noted  . Chronic left-sided low back pain without sciatica 07/23/2015    Allergies  Allergen Reactions  . Shellfish Allergy Anaphylaxis    History reviewed. No pertinent surgical history.  Social History   Tobacco Use  . Smoking status: Never Smoker  . Smokeless tobacco: Never Used  Substance Use Topics  . Alcohol use: Yes  . Drug use: No     Medication list has been reviewed and updated.  Current Meds  Medication Sig  . ALPRAZolam (XANAX) 1 MG tablet Take 1 mg by mouth at bedtime as needed for anxiety.  Marland Kitchen amphetamine-dextroamphetamine (ADDERALL XR) 20 MG 24 hr capsule Take 20 mg by mouth 2 (two) times daily.   Marland Kitchen atorvastatin (LIPITOR) 20 MG tablet Take 1 tablet (20 mg total) by mouth daily.  . DULoxetine (CYMBALTA) 60 MG capsule   .  etonogestrel-ethinyl estradiol (NUVARING) 0.12-0.015 MG/24HR vaginal ring USE AS DIRECTED BY DOCTOR  . hydrOXYzine (VISTARIL) 25 MG capsule TAKE 1-2 CAPSULES BY MOUTH EVERY 6 HOURS  . lisinopril-hydrochlorothiazide (PRINZIDE,ZESTORETIC) 10-12.5 MG tablet Take 1 tablet by mouth daily.  . pantoprazole (PROTONIX) 20 MG tablet Take 20 mg by mouth 2 (two) times daily.  . promethazine (PHENERGAN) 25 MG tablet Take 1 tablet (25 mg total) by mouth every 8 (eight) hours as needed for nausea or vomiting.  . valACYclovir (VALTREX) 1000 MG tablet     PHQ 2/9 Scores 05/18/2018  PHQ - 2 Score 0    Physical Exam Constitutional:      Appearance: She is well-developed.  HENT:     Right Ear: Ear canal and external ear normal. A middle ear effusion is present. Tympanic membrane is not erythematous or retracted.     Left Ear: Ear canal and external ear normal. Tympanic membrane is not erythematous or retracted.     Nose:     Right Sinus: Maxillary sinus tenderness and frontal sinus tenderness present.     Left Sinus: Maxillary sinus tenderness and frontal sinus tenderness present.     Mouth/Throat:     Mouth: No oral lesions.     Pharynx: Uvula midline. Posterior oropharyngeal erythema present. No oropharyngeal exudate.     Tonsils: No tonsillar exudate. Swelling: 2+ on the right. 0 on the left.  Cardiovascular:     Rate and Rhythm: Normal rate and regular rhythm.     Heart sounds: Normal heart sounds.  Pulmonary:     Breath sounds: Normal breath sounds. No wheezing, rhonchi or rales.  Lymphadenopathy:     Cervical: No cervical adenopathy.  Neurological:     Mental Status: She is alert and oriented to person, place, and time.     BP 128/86 (BP Location: Right Arm, Patient Position: Sitting, Cuff Size: Large)   Pulse 100   Temp 98.7 F (37.1 C) (Oral)   Ht 6' (1.829 m)   Wt 248 lb (112.5 kg)   LMP  (Exact Date) Comment: Nuva Ring  SpO2 97%   BMI 33.63 kg/m   Assessment and Plan: 1. Acute  non-recurrent maxillary sinusitis Continue fluids, rest, nsaids - azithromycin (ZITHROMAX Z-PAK) 250 MG tablet; UAD  Dispense: 6 each; Refill: 0  2. Cough - guaiFENesin-codeine (ROBITUSSIN AC) 100-10 MG/5ML syrup; Take 5 mLs by mouth 3 (three) times daily as needed for up to 7 days for cough.  Dispense: 118 mL; Refill: 0   Partially dictated using Editor, commissioning. Any errors are unintentional.  Halina Maidens, MD [facility]  05/18/2018

## 2018-06-28 ENCOUNTER — Other Ambulatory Visit: Payer: Self-pay

## 2018-06-28 DIAGNOSIS — A09 Infectious gastroenteritis and colitis, unspecified: Secondary | ICD-10-CM

## 2018-06-28 MED ORDER — VANCOMYCIN HCL 125 MG PO CAPS: 125.0000 mg | ORAL_CAPSULE | Freq: Three times a day (TID) | ORAL | 0 refills | Status: DC

## 2018-06-28 NOTE — Progress Notes (Unsigned)
Sent in vanc for c-diff

## 2018-07-18 ENCOUNTER — Emergency Department
Admission: EM | Admit: 2018-07-18 | Discharge: 2018-07-18 | Disposition: A | Discharge status: Home / Self Care | Payer: PRIVATE HEALTH INSURANCE | Attending: Emergency Medicine | Admitting: Emergency Medicine

## 2018-07-18 ENCOUNTER — Other Ambulatory Visit: Payer: Self-pay

## 2018-07-18 ENCOUNTER — Emergency Department: Payer: PRIVATE HEALTH INSURANCE

## 2018-07-18 DIAGNOSIS — S060X9A Concussion with loss of consciousness of unspecified duration, initial encounter: Secondary | ICD-10-CM | POA: Insufficient documentation

## 2018-07-18 DIAGNOSIS — Z79899 Other long term (current) drug therapy: Secondary | ICD-10-CM | POA: Insufficient documentation

## 2018-07-18 DIAGNOSIS — Y999 Unspecified external cause status: Secondary | ICD-10-CM | POA: Diagnosis not present

## 2018-07-18 DIAGNOSIS — Y9389 Activity, other specified: Secondary | ICD-10-CM | POA: Insufficient documentation

## 2018-07-18 DIAGNOSIS — Y929 Unspecified place or not applicable: Secondary | ICD-10-CM | POA: Insufficient documentation

## 2018-07-18 MED ORDER — ONDANSETRON 4 MG PO TBDP
4.0000 mg | ORAL_TABLET | Freq: Once | ORAL | Status: AC
  Administered 2018-07-18: 4 mg via ORAL
  Filled 2018-07-18: qty 1

## 2018-07-18 MED ORDER — BUTALBITAL-APAP-CAFFEINE 50-325-40 MG PO TABS
1.0000 | ORAL_TABLET | Freq: Once | ORAL | Status: AC
  Administered 2018-07-18: 1 via ORAL
  Filled 2018-07-18: qty 1

## 2018-07-18 MED ORDER — BUTALBITAL-APAP-CAFFEINE 50-325-40 MG PO TABS: 1.0000 | ORAL_TABLET | Freq: Four times a day (QID) | ORAL | 0 refills | Status: DC | PRN

## 2018-07-18 MED ORDER — ONDANSETRON HCL 4 MG PO TABS: 4.0000 mg | ORAL_TABLET | Freq: Three times a day (TID) | ORAL | 0 refills | Status: DC | PRN
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
## 2018-07-18 NOTE — ED Notes (Signed)
Report to angela, rn. 

## 2018-07-18 NOTE — Discharge Instructions (Signed)
Please seek medical attention for any high fevers, chest pain, shortness of breath, change in behavior, persistent vomiting, bloody stool or any other new or concerning symptoms.  
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
## 2018-08-19 ENCOUNTER — Other Ambulatory Visit: Payer: Self-pay

## 2018-08-19 MED ORDER — ATORVASTATIN CALCIUM 20 MG PO TABS: 20.0000 mg | ORAL_TABLET | Freq: Every day | ORAL | 0 refills | Status: DC

## 2018-08-27 ENCOUNTER — Other Ambulatory Visit: Payer: Self-pay | Admitting: Family Medicine

## 2018-08-31 ENCOUNTER — Ambulatory Visit (INDEPENDENT_AMBULATORY_CARE_PROVIDER_SITE_OTHER): Payer: Self-pay | Admitting: Internal Medicine

## 2018-08-31 ENCOUNTER — Other Ambulatory Visit: Payer: Self-pay

## 2018-08-31 ENCOUNTER — Encounter: Payer: Self-pay | Admitting: Internal Medicine

## 2018-08-31 ENCOUNTER — Telehealth: Payer: Self-pay

## 2018-08-31 VITALS — Ht 72.0 in | Wt 248.0 lb

## 2018-08-31 DIAGNOSIS — G44309 Post-traumatic headache, unspecified, not intractable: Secondary | ICD-10-CM

## 2018-08-31 DIAGNOSIS — M50322 Other cervical disc degeneration at C5-C6 level: Secondary | ICD-10-CM

## 2018-08-31 MED ORDER — BUTALBITAL-APAP-CAFFEINE 50-325-40 MG PO TABS: 1.0000 | ORAL_TABLET | Freq: Every day | ORAL | 0 refills | Status: DC | PRN
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
## 2018-08-31 NOTE — Patient Instructions (Signed)
Brain rest for the next 2 weeks.  Fish Oil 300 mg per day divided doses  Vitamin D 2000 IU daily  Choline 500 mg per day (or the closest amount you can find)  Tart Cherry extract or juice several times per day  Take the fioricet only for severe headaches

## 2018-09-01 ENCOUNTER — Other Ambulatory Visit: Payer: Self-pay

## 2018-09-01 MED ORDER — GABAPENTIN 300 MG PO CAPS: 300.0000 mg | ORAL_CAPSULE | Freq: Two times a day (BID) | ORAL | 0 refills | Status: DC

## 2018-09-01 NOTE — Progress Notes (Unsigned)
Sent gabapentin

## 2018-09-06 ENCOUNTER — Other Ambulatory Visit: Payer: Self-pay

## 2018-09-06 DIAGNOSIS — B379 Candidiasis, unspecified: Secondary | ICD-10-CM

## 2018-09-06 DIAGNOSIS — T3695XA Adverse effect of unspecified systemic antibiotic, initial encounter: Principal | ICD-10-CM

## 2018-09-06 MED ORDER — NYSTATIN 100000 UNIT/GM EX POWD: Freq: Four times a day (QID) | CUTANEOUS | 0 refills | Status: DC

## 2018-09-06 MED ORDER — NYSTATIN 100000 UNIT/GM EX CREA: 1.0000 "application " | TOPICAL_CREAM | Freq: Two times a day (BID) | CUTANEOUS | 0 refills | Status: DC

## 2018-09-06 NOTE — Progress Notes (Unsigned)
Sent in nystatin cream and powder

## 2018-09-08 ENCOUNTER — Other Ambulatory Visit: Payer: Self-pay | Admitting: Family Medicine

## 2018-09-21 ENCOUNTER — Other Ambulatory Visit: Payer: Self-pay | Admitting: Internal Medicine

## 2018-09-21 DIAGNOSIS — R197 Diarrhea, unspecified: Secondary | ICD-10-CM

## 2018-09-22 ENCOUNTER — Other Ambulatory Visit
Admission: RE | Admit: 2018-09-22 | Discharge: 2018-09-22 | Disposition: A | Discharge status: Home / Self Care | Payer: PRIVATE HEALTH INSURANCE | Attending: Internal Medicine | Admitting: Internal Medicine

## 2018-09-22 DIAGNOSIS — R197 Diarrhea, unspecified: Secondary | ICD-10-CM | POA: Diagnosis not present

## 2018-09-22 LAB — GASTROINTESTINAL PANEL BY PCR, STOOL (REPLACES STOOL CULTURE)

## 2018-09-27 ENCOUNTER — Other Ambulatory Visit: Payer: Self-pay

## 2018-09-27 DIAGNOSIS — R197 Diarrhea, unspecified: Secondary | ICD-10-CM

## 2018-09-29 LAB — CLOSTRIDIUM DIFFICILE EIA: C difficile Toxins A+B, EIA: NEGATIVE

## 2018-09-29 LAB — SPECIMEN STATUS REPORT

## 2018-10-01 ENCOUNTER — Other Ambulatory Visit: Payer: Self-pay | Admitting: Internal Medicine

## 2018-10-01 ENCOUNTER — Ambulatory Visit: Payer: PRIVATE HEALTH INSURANCE

## 2018-10-01 DIAGNOSIS — R197 Diarrhea, unspecified: Secondary | ICD-10-CM

## 2018-10-05 LAB — SPECIMEN STATUS REPORT

## 2018-10-05 LAB — OVA AND PARASITE EXAMINATION

## 2018-10-05 LAB — FECAL OCCULT BLOOD, IMMUNOCHEMICAL

## 2018-10-05 LAB — FECAL LACTOFERRIN, QUANT

## 2018-10-07 ENCOUNTER — Other Ambulatory Visit: Payer: Self-pay

## 2018-10-07 DIAGNOSIS — R197 Diarrhea, unspecified: Secondary | ICD-10-CM

## 2018-10-13 ENCOUNTER — Ambulatory Visit (INDEPENDENT_AMBULATORY_CARE_PROVIDER_SITE_OTHER): Payer: PRIVATE HEALTH INSURANCE | Admitting: Gastroenterology

## 2018-10-13 ENCOUNTER — Other Ambulatory Visit: Payer: Self-pay

## 2018-10-13 ENCOUNTER — Encounter: Payer: Self-pay | Admitting: Gastroenterology

## 2018-10-13 VITALS — BP 120/82 | HR 105 | Resp 18 | Ht 72.0 in | Wt 229.0 lb

## 2018-10-13 DIAGNOSIS — K529 Noninfective gastroenteritis and colitis, unspecified: Secondary | ICD-10-CM

## 2018-10-13 MED ORDER — DICYCLOMINE HCL 10 MG PO CAPS: 10.0000 mg | ORAL_CAPSULE | Freq: Three times a day (TID) | ORAL | 0 refills | Status: DC

## 2018-10-13 MED ORDER — AMITRIPTYLINE HCL 25 MG PO TABS: 25.0000 mg | ORAL_TABLET | Freq: Every day | ORAL | 1 refills | Status: DC
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
## 2018-10-14 ENCOUNTER — Telehealth: Payer: Self-pay | Admitting: Gastroenterology

## 2018-10-14 MED ORDER — ONDANSETRON HCL 4 MG PO TABS: 4.0000 mg | ORAL_TABLET | Freq: Three times a day (TID) | ORAL | 0 refills | Status: DC | PRN

## 2018-10-14 NOTE — Telephone Encounter (Signed)
Patient called and is have problems with nausea. Please call her in Zofran or Phenergan. She prefers Zofran due to less drowsy. [facility].

## 2018-10-14 NOTE — Telephone Encounter (Signed)
Sent!

## 2018-10-19 LAB — COMPREHENSIVE METABOLIC PANEL
ALT: 24 IU/L (ref 0–32)
AST: 15 IU/L (ref 0–40)
Albumin/Globulin Ratio: 2 (ref 1.2–2.2)
Albumin: 4.5 g/dL (ref 3.8–4.8)
Alkaline Phosphatase: 59 IU/L (ref 39–117)
BUN/Creatinine Ratio: 13 (ref 9–23)
BUN: 11 mg/dL (ref 6–20)
Bilirubin Total: <0.2 mg/dL (ref 0.0–1.2)
CO2: 17 mmol/L — ABNORMAL LOW (ref 20–29)
Calcium: 9.7 mg/dL (ref 8.7–10.2)
Chloride: 102 mmol/L (ref 96–106)
Creatinine, Ser: 0.83 mg/dL (ref 0.57–1.00)
GFR calc Af Amer: 106 mL/min/{1.73_m2} (ref >59)
GFR calc non Af Amer: 92 mL/min/{1.73_m2} (ref >59)
Globulin, Total: 2.3 g/dL (ref 1.5–4.5)
Glucose: 94 mg/dL (ref 65–99)
Potassium: 4.5 mmol/L (ref 3.5–5.2)
Sodium: 136 mmol/L (ref 134–144)
Total Protein: 6.8 g/dL (ref 6.0–8.5)

## 2018-10-19 LAB — CBC
Hematocrit: 39.5 % (ref 34.0–46.6)
Hemoglobin: 13.3 g/dL (ref 11.1–15.9)
MCH: 29.2 pg (ref 26.6–33.0)
MCHC: 33.7 g/dL (ref 31.5–35.7)
MCV: 87 fL (ref 79–97)
Platelets: 452 10*3/uL — ABNORMAL HIGH (ref 150–450)
RBC: 4.55 x10E6/uL (ref 3.77–5.28)
RDW: 13 % (ref 11.7–15.4)
WBC: 10.5 10*3/uL (ref 3.4–10.8)

## 2018-10-19 LAB — OVA AND PARASITE EXAMINATION

## 2018-10-19 LAB — TSH: TSH: 3.54 u[IU]/mL (ref 0.450–4.500)

## 2018-10-19 LAB — TISSUE TRANSGLUTAMINASE, IGA: Transglutaminase IgA: <2 U/mL (ref 0–3)

## 2018-10-19 LAB — IGA: IgA/Immunoglobulin A, Serum: 194 mg/dL (ref 87–352)

## 2018-10-19 LAB — C-REACTIVE PROTEIN: CRP: 4 mg/L (ref 0–10)

## 2018-10-21 LAB — CALPROTECTIN, FECAL: Calprotectin, Fecal: <16 ug/g (ref 0–120)

## 2018-10-26 ENCOUNTER — Other Ambulatory Visit: Payer: Self-pay

## 2018-10-26 MED ORDER — VALACYCLOVIR HCL 1 G PO TABS: 1000.0000 mg | ORAL_TABLET | Freq: Two times a day (BID) | ORAL | 1 refills | Status: DC | PRN

## 2018-11-03 ENCOUNTER — Other Ambulatory Visit: Payer: Self-pay | Admitting: Internal Medicine

## 2018-11-03 ENCOUNTER — Telehealth: Payer: Self-pay | Admitting: Gastroenterology

## 2018-11-03 ENCOUNTER — Other Ambulatory Visit: Payer: Self-pay | Admitting: Family Medicine

## 2018-11-03 ENCOUNTER — Encounter: Payer: Self-pay | Admitting: Internal Medicine

## 2018-11-03 DIAGNOSIS — E782 Mixed hyperlipidemia: Secondary | ICD-10-CM | POA: Insufficient documentation

## 2018-11-03 DIAGNOSIS — I10 Essential (primary) hypertension: Secondary | ICD-10-CM | POA: Insufficient documentation

## 2018-11-03 NOTE — Telephone Encounter (Signed)
Pt is calling she has an apt coming up on 11/24/18 she had had of and on dark blood in her stooll which is Diarrhea, she did have it again this weekend please call pt

## 2018-11-04 NOTE — Telephone Encounter (Signed)
LVM asking pt to call office

## 2018-11-06 ENCOUNTER — Emergency Department: Payer: PRIVATE HEALTH INSURANCE

## 2018-11-06 ENCOUNTER — Encounter: Payer: Self-pay | Admitting: Emergency Medicine

## 2018-11-06 ENCOUNTER — Encounter
Admission: EM | Disposition: A | Discharge status: Home / Self Care | Payer: Self-pay | Source: Home / Self Care | Attending: Emergency Medicine

## 2018-11-06 ENCOUNTER — Observation Stay: Payer: PRIVATE HEALTH INSURANCE | Admitting: Anesthesiology

## 2018-11-06 ENCOUNTER — Observation Stay
Admission: EM | Admit: 2018-11-06 | Discharge: 2018-11-08 | DRG: 343 | Disposition: A | Discharge status: Home / Self Care | Payer: PRIVATE HEALTH INSURANCE | Attending: General Surgery | Admitting: General Surgery

## 2018-11-06 ENCOUNTER — Other Ambulatory Visit: Payer: Self-pay

## 2018-11-06 DIAGNOSIS — Z91013 Allergy to seafood: Secondary | ICD-10-CM

## 2018-11-06 DIAGNOSIS — K353 Acute appendicitis with localized peritonitis, without perforation or gangrene: Secondary | ICD-10-CM | POA: Diagnosis not present

## 2018-11-06 DIAGNOSIS — Z808 Family history of malignant neoplasm of other organs or systems: Secondary | ICD-10-CM

## 2018-11-06 DIAGNOSIS — I10 Essential (primary) hypertension: Secondary | ICD-10-CM | POA: Diagnosis not present

## 2018-11-06 DIAGNOSIS — Z800 Family history of malignant neoplasm of digestive organs: Secondary | ICD-10-CM | POA: Diagnosis not present

## 2018-11-06 DIAGNOSIS — Z20828 Contact with and (suspected) exposure to other viral communicable diseases: Secondary | ICD-10-CM | POA: Diagnosis not present

## 2018-11-06 DIAGNOSIS — Z807 Family history of other malignant neoplasms of lymphoid, hematopoietic and related tissues: Secondary | ICD-10-CM | POA: Diagnosis not present

## 2018-11-06 DIAGNOSIS — K37 Unspecified appendicitis: Secondary | ICD-10-CM

## 2018-11-06 DIAGNOSIS — F419 Anxiety disorder, unspecified: Secondary | ICD-10-CM | POA: Diagnosis not present

## 2018-11-06 DIAGNOSIS — Z91048 Other nonmedicinal substance allergy status: Secondary | ICD-10-CM

## 2018-11-06 DIAGNOSIS — R1031 Right lower quadrant pain: Secondary | ICD-10-CM | POA: Diagnosis present

## 2018-11-06 DIAGNOSIS — E782 Mixed hyperlipidemia: Secondary | ICD-10-CM | POA: Diagnosis present

## 2018-11-06 HISTORY — PX: LAPAROSCOPIC APPENDECTOMY: SHX408

## 2018-11-06 HISTORY — DX: Acute appendicitis with localized peritonitis, without perforation or gangrene: K35.30

## 2018-11-06 LAB — URINALYSIS, COMPLETE (UACMP) WITH MICROSCOPIC
Bilirubin Urine: NEGATIVE
Glucose, UA: NEGATIVE mg/dL
Hgb urine dipstick: NEGATIVE
Ketones, ur: NEGATIVE mg/dL
Nitrite: NEGATIVE
Protein, ur: 30 mg/dL — AB
Specific Gravity, Urine: 1.024 (ref 1.005–1.030)
pH: 6 (ref 5.0–8.0)

## 2018-11-06 LAB — POCT PREGNANCY, URINE: Preg Test, Ur: NEGATIVE

## 2018-11-06 LAB — CBC
HCT: 40 % (ref 36.0–46.0)
Hemoglobin: 13.7 g/dL (ref 12.0–15.0)
MCH: 29.7 pg (ref 26.0–34.0)
MCHC: 34.3 g/dL (ref 30.0–36.0)
MCV: 86.6 fL (ref 80.0–100.0)
Platelets: 441 10*3/uL — ABNORMAL HIGH (ref 150–400)
RBC: 4.62 MIL/uL (ref 3.87–5.11)
RDW: 13.2 % (ref 11.5–15.5)
WBC: 20.1 10*3/uL — ABNORMAL HIGH (ref 4.0–10.5)
nRBC: 0 % (ref 0.0–0.2)

## 2018-11-06 LAB — COMPREHENSIVE METABOLIC PANEL
ALT: 23 U/L (ref 0–44)
AST: 18 U/L (ref 15–41)
Albumin: 4.4 g/dL (ref 3.5–5.0)
Alkaline Phosphatase: 56 U/L (ref 38–126)
Anion gap: 10 (ref 5–15)
BUN: 10 mg/dL (ref 6–20)
CO2: 20 mmol/L — ABNORMAL LOW (ref 22–32)
Calcium: 8.6 mg/dL — ABNORMAL LOW (ref 8.9–10.3)
Chloride: 103 mmol/L (ref 98–111)
Creatinine, Ser: 0.63 mg/dL (ref 0.44–1.00)
GFR calc Af Amer: >60 mL/min (ref >60)
GFR calc non Af Amer: >60 mL/min (ref >60)
Glucose, Bld: 91 mg/dL (ref 70–99)
Potassium: 3.6 mmol/L (ref 3.5–5.1)
Sodium: 133 mmol/L — ABNORMAL LOW (ref 135–145)
Total Bilirubin: 0.3 mg/dL (ref 0.3–1.2)
Total Protein: 7.5 g/dL (ref 6.5–8.1)

## 2018-11-06 LAB — SARS CORONAVIRUS 2 BY RT PCR (HOSPITAL ORDER, PERFORMED IN ~~LOC~~ HOSPITAL LAB): SARS Coronavirus 2: NEGATIVE

## 2018-11-06 LAB — SURGICAL PCR SCREEN
MRSA, PCR: NEGATIVE
Staphylococcus aureus: POSITIVE — AB

## 2018-11-06 LAB — LIPASE, BLOOD: Lipase: 23 U/L (ref 11–51)

## 2018-11-06 SURGERY — APPENDECTOMY, LAPAROSCOPIC
Anesthesia: General

## 2018-11-06 MED ORDER — SODIUM CHLORIDE 0.9 % IV SOLN
INTRAVENOUS | Status: DC
  Administered 2018-11-06 – 2018-11-08 (×5): via INTRAVENOUS

## 2018-11-06 MED ORDER — ENOXAPARIN SODIUM 40 MG/0.4ML ~~LOC~~ SOLN
40.0000 mg | SUBCUTANEOUS | Status: DC
  Administered 2018-11-06 – 2018-11-07 (×2): 40 mg via SUBCUTANEOUS
  Filled 2018-11-06 (×2): qty 0.4

## 2018-11-06 MED ORDER — FENTANYL CITRATE (PF) 100 MCG/2ML IJ SOLN
INTRAMUSCULAR | Status: DC | PRN
  Administered 2018-11-06 (×4): 50 ug via INTRAVENOUS

## 2018-11-06 MED ORDER — SODIUM CHLORIDE 0.9 % IV SOLN
INTRAVENOUS | Status: DC | PRN
  Administered 2018-11-06: 250 mL via INTRAVENOUS

## 2018-11-06 MED ORDER — CEFAZOLIN SODIUM-DEXTROSE 2-3 GM-%(50ML) IV SOLR
INTRAVENOUS | Status: DC | PRN
  Administered 2018-11-06: 2 g via INTRAVENOUS

## 2018-11-06 MED ORDER — LISINOPRIL 20 MG PO TABS
20.0000 mg | ORAL_TABLET | Freq: Every day | ORAL | Status: DC
  Administered 2018-11-07 – 2018-11-08 (×2): 20 mg via ORAL
  Filled 2018-11-06 (×2): qty 1

## 2018-11-06 MED ORDER — FENTANYL CITRATE (PF) 100 MCG/2ML IJ SOLN
INTRAMUSCULAR | Status: AC
  Administered 2018-11-06: 22:00:00 25 ug via INTRAVENOUS
  Filled 2018-11-06: qty 2

## 2018-11-06 MED ORDER — LABETALOL HCL 5 MG/ML IV SOLN
INTRAVENOUS | Status: AC
  Administered 2018-11-06: 10 mg via INTRAVENOUS
  Filled 2018-11-06: qty 4

## 2018-11-06 MED ORDER — HYDROCHLOROTHIAZIDE 25 MG PO TABS
25.0000 mg | ORAL_TABLET | Freq: Every day | ORAL | Status: DC
  Administered 2018-11-06 – 2018-11-08 (×3): 25 mg via ORAL
  Filled 2018-11-06 (×3): qty 1

## 2018-11-06 MED ORDER — ACETAMINOPHEN 325 MG PO TABS: 650.0000 mg | ORAL_TABLET | Freq: Four times a day (QID) | ORAL | Status: DC | PRN

## 2018-11-06 MED ORDER — FENTANYL CITRATE (PF) 100 MCG/2ML IJ SOLN
INTRAMUSCULAR | Status: AC
  Filled 2018-11-06: qty 2

## 2018-11-06 MED ORDER — LISINOPRIL-HYDROCHLOROTHIAZIDE 20-25 MG PO TABS: 1.0000 | ORAL_TABLET | Freq: Every day | ORAL | Status: DC

## 2018-11-06 MED ORDER — GABAPENTIN 300 MG PO CAPS
300.0000 mg | ORAL_CAPSULE | Freq: Two times a day (BID) | ORAL | Status: DC
  Administered 2018-11-06 – 2018-11-08 (×4): 300 mg via ORAL
  Filled 2018-11-06 (×4): qty 1

## 2018-11-06 MED ORDER — ONDANSETRON HCL 4 MG/2ML IJ SOLN
INTRAMUSCULAR | Status: DC | PRN
  Administered 2018-11-06: 4 mg via INTRAVENOUS

## 2018-11-06 MED ORDER — IOHEXOL 300 MG/ML  SOLN
100.0000 mL | Freq: Once | INTRAMUSCULAR | Status: AC | PRN
  Administered 2018-11-06: 100 mL via INTRAVENOUS
  Filled 2018-11-06: qty 100

## 2018-11-06 MED ORDER — PIPERACILLIN-TAZOBACTAM 3.375 G IVPB
3.3750 g | Freq: Three times a day (TID) | INTRAVENOUS | Status: DC
  Administered 2018-11-06 – 2018-11-08 (×5): 3.375 g via INTRAVENOUS
  Filled 2018-11-06 (×5): qty 50

## 2018-11-06 MED ORDER — HYDROMORPHONE HCL 1 MG/ML IJ SOLN
0.2500 mg | INTRAMUSCULAR | Status: DC | PRN
  Administered 2018-11-06: 0.5 mg via INTRAVENOUS
  Administered 2018-11-06 (×2): 0.25 mg via INTRAVENOUS

## 2018-11-06 MED ORDER — FENTANYL CITRATE (PF) 100 MCG/2ML IJ SOLN
25.0000 ug | INTRAMUSCULAR | Status: DC | PRN
  Administered 2018-11-06 (×4): 25 ug via INTRAVENOUS

## 2018-11-06 MED ORDER — AMPHETAMINE-DEXTROAMPHETAMINE 20 MG PO TABS
20.0000 mg | ORAL_TABLET | Freq: Two times a day (BID) | ORAL | Status: DC
  Filled 2018-11-06: qty 1

## 2018-11-06 MED ORDER — AMITRIPTYLINE HCL 25 MG PO TABS
25.0000 mg | ORAL_TABLET | Freq: Every day | ORAL | Status: DC
  Administered 2018-11-06 – 2018-11-07 (×2): 25 mg via ORAL
  Filled 2018-11-06 (×3): qty 1

## 2018-11-06 MED ORDER — SUGAMMADEX SODIUM 200 MG/2ML IV SOLN
INTRAVENOUS | Status: DC | PRN
  Administered 2018-11-06: 200 mg via INTRAVENOUS

## 2018-11-06 MED ORDER — DICYCLOMINE HCL 10 MG PO CAPS
10.0000 mg | ORAL_CAPSULE | Freq: Three times a day (TID) | ORAL | Status: DC
  Administered 2018-11-06 – 2018-11-08 (×6): 10 mg via ORAL
  Filled 2018-11-06 (×9): qty 1

## 2018-11-06 MED ORDER — MORPHINE SULFATE (PF) 4 MG/ML IV SOLN
4.0000 mg | Freq: Once | INTRAVENOUS | Status: AC
  Administered 2018-11-06: 4 mg via INTRAVENOUS
  Filled 2018-11-06: qty 1

## 2018-11-06 MED ORDER — ACETAMINOPHEN 650 MG RE SUPP: 650.0000 mg | Freq: Four times a day (QID) | RECTAL | Status: DC | PRN

## 2018-11-06 MED ORDER — ONDANSETRON HCL 4 MG/2ML IJ SOLN: 4.0000 mg | Freq: Once | INTRAMUSCULAR | Status: DC | PRN

## 2018-11-06 MED ORDER — HYDROMORPHONE HCL 1 MG/ML IJ SOLN
INTRAMUSCULAR | Status: AC
  Administered 2018-11-06: 22:00:00 0.25 mg via INTRAVENOUS
  Filled 2018-11-06: qty 1

## 2018-11-06 MED ORDER — ONDANSETRON HCL 4 MG/2ML IJ SOLN
4.0000 mg | Freq: Four times a day (QID) | INTRAMUSCULAR | Status: DC | PRN
  Administered 2018-11-07 – 2018-11-08 (×3): 4 mg via INTRAVENOUS
  Filled 2018-11-06 (×3): qty 2

## 2018-11-06 MED ORDER — ONDANSETRON HCL 4 MG/2ML IJ SOLN
4.0000 mg | Freq: Once | INTRAMUSCULAR | Status: AC
  Administered 2018-11-06: 4 mg via INTRAVENOUS
  Filled 2018-11-06: qty 2

## 2018-11-06 MED ORDER — SUCCINYLCHOLINE CHLORIDE 20 MG/ML IJ SOLN
INTRAMUSCULAR | Status: DC | PRN
  Administered 2018-11-06: 100 mg via INTRAVENOUS

## 2018-11-06 MED ORDER — PIPERACILLIN-TAZOBACTAM 3.375 G IVPB 30 MIN
3.3750 g | Freq: Once | INTRAVENOUS | Status: AC
  Administered 2018-11-06: 3.375 g via INTRAVENOUS
  Filled 2018-11-06: qty 50

## 2018-11-06 MED ORDER — CEFAZOLIN SODIUM 1 G IJ SOLR
INTRAMUSCULAR | Status: AC
  Filled 2018-11-06: qty 20

## 2018-11-06 MED ORDER — PROPOFOL 10 MG/ML IV BOLUS
INTRAVENOUS | Status: DC | PRN
  Administered 2018-11-06: 160 mg via INTRAVENOUS

## 2018-11-06 MED ORDER — BUPIVACAINE-EPINEPHRINE (PF) 0.5% -1:200000 IJ SOLN
INTRAMUSCULAR | Status: AC
  Filled 2018-11-06: qty 30

## 2018-11-06 MED ORDER — DULOXETINE HCL 30 MG PO CPEP
60.0000 mg | ORAL_CAPSULE | Freq: Every day | ORAL | Status: DC
  Administered 2018-11-07 – 2018-11-08 (×2): 60 mg via ORAL
  Filled 2018-11-06 (×2): qty 2

## 2018-11-06 MED ORDER — AMPHETAMINE-DEXTROAMPHET ER 5 MG PO CP24
30.0000 mg | ORAL_CAPSULE | ORAL | Status: DC
  Filled 2018-11-06: qty 6

## 2018-11-06 MED ORDER — ONDANSETRON 4 MG PO TBDP: 4.0000 mg | ORAL_TABLET | Freq: Four times a day (QID) | ORAL | Status: DC | PRN

## 2018-11-06 MED ORDER — ROCURONIUM BROMIDE 100 MG/10ML IV SOLN
INTRAVENOUS | Status: DC | PRN
  Administered 2018-11-06: 30 mg via INTRAVENOUS

## 2018-11-06 MED ORDER — LABETALOL HCL 5 MG/ML IV SOLN
10.0000 mg | Freq: Once | INTRAVENOUS | Status: AC
  Administered 2018-11-06: 10 mg via INTRAVENOUS

## 2018-11-06 MED ORDER — ALPRAZOLAM 0.5 MG PO TABS: 1.0000 mg | ORAL_TABLET | Freq: Every evening | ORAL | Status: DC | PRN

## 2018-11-06 MED ORDER — LIDOCAINE HCL (CARDIAC) PF 100 MG/5ML IV SOSY
PREFILLED_SYRINGE | INTRAVENOUS | Status: DC | PRN
  Administered 2018-11-06: 100 mg via INTRAVENOUS

## 2018-11-06 MED ORDER — LACTATED RINGERS IV SOLN
INTRAVENOUS | Status: DC | PRN
  Administered 2018-11-06: 20:00:00 via INTRAVENOUS

## 2018-11-06 MED ORDER — HYDROCODONE-ACETAMINOPHEN 5-325 MG PO TABS
1.0000 | ORAL_TABLET | ORAL | Status: DC | PRN
  Administered 2018-11-06: 2 via ORAL
  Administered 2018-11-07: 18:00:00 1 via ORAL
  Administered 2018-11-07: 2 via ORAL
  Administered 2018-11-07: 1 via ORAL
  Administered 2018-11-08: 2 via ORAL
  Filled 2018-11-06 (×3): qty 2
  Filled 2018-11-06 (×2): qty 1

## 2018-11-06 MED ORDER — PANTOPRAZOLE SODIUM 20 MG PO TBEC
20.0000 mg | DELAYED_RELEASE_TABLET | Freq: Two times a day (BID) | ORAL | Status: DC
  Administered 2018-11-07 – 2018-11-08 (×3): 20 mg via ORAL
  Filled 2018-11-06 (×5): qty 1

## 2018-11-06 MED ORDER — DEXAMETHASONE SODIUM PHOSPHATE 10 MG/ML IJ SOLN
INTRAMUSCULAR | Status: DC | PRN
  Administered 2018-11-06: 10 mg via INTRAVENOUS

## 2018-11-06 MED ORDER — MORPHINE SULFATE (PF) 4 MG/ML IV SOLN
4.0000 mg | INTRAVENOUS | Status: DC | PRN
  Administered 2018-11-06 – 2018-11-08 (×7): 4 mg via INTRAVENOUS
  Filled 2018-11-06 (×7): qty 1

## 2018-11-06 SURGICAL SUPPLY — 40 items
APPLIER CLIP LOGIC TI 5 (MISCELLANEOUS) IMPLANT
BLADE SURG SZ11 CARB STEEL (BLADE) ×3 IMPLANT
CANISTER SUCT 1200ML W/VALVE (MISCELLANEOUS) ×3 IMPLANT
CHLORAPREP W/TINT 26 (MISCELLANEOUS) ×3 IMPLANT
COVER WAND RF STERILE (DRAPES) ×3 IMPLANT
CUTTER FLEX LINEAR 45M (STAPLE) ×3 IMPLANT
DERMABOND ADVANCED (GAUZE/BANDAGES/DRESSINGS) ×2
DERMABOND ADVANCED .7 DNX12 (GAUZE/BANDAGES/DRESSINGS) ×1 IMPLANT
ELECT REM PT RETURN 9FT ADLT (ELECTROSURGICAL) ×3
ELECTRODE REM PT RTRN 9FT ADLT (ELECTROSURGICAL) ×1 IMPLANT
GLOVE BIO SURGEON STRL SZ 6.5 (GLOVE) ×2 IMPLANT
GLOVE BIO SURGEONS STRL SZ 6.5 (GLOVE) ×1
GLOVE INDICATOR 6.5 STRL GRN (GLOVE) ×3 IMPLANT
GOWN STRL REUS W/ TWL LRG LVL3 (GOWN DISPOSABLE) ×3 IMPLANT
GOWN STRL REUS W/TWL LRG LVL3 (GOWN DISPOSABLE) ×6
GRASPER SUT TROCAR 14GX15 (MISCELLANEOUS) ×3 IMPLANT
HANDLE YANKAUER SUCT BULB TIP (MISCELLANEOUS) ×3 IMPLANT
IRRIGATION STRYKERFLOW (MISCELLANEOUS) IMPLANT
IRRIGATOR STRYKERFLOW (MISCELLANEOUS)
IV NS 1000ML (IV SOLUTION) ×2
IV NS 1000ML BAXH (IV SOLUTION) ×1 IMPLANT
KIT TURNOVER KIT A (KITS) ×3 IMPLANT
LIGASURE LAP MARYLAND 5MM 37CM (ELECTROSURGICAL) IMPLANT
NEEDLE HYPO 22GX1.5 SAFETY (NEEDLE) ×3 IMPLANT
NEEDLE VERESS 14GA 120MM (NEEDLE) ×3 IMPLANT
NS IRRIG 500ML POUR BTL (IV SOLUTION) ×3 IMPLANT
PACK LAP CHOLECYSTECTOMY (MISCELLANEOUS) ×3 IMPLANT
POUCH ENDO CATCH 10MM SPEC (MISCELLANEOUS) ×3 IMPLANT
RELOAD 45 VASCULAR/THIN (ENDOMECHANICALS) IMPLANT
RELOAD STAPLE TA45 3.5 REG BLU (ENDOMECHANICALS) ×3 IMPLANT
SCISSORS METZENBAUM CVD 33 (INSTRUMENTS) ×3 IMPLANT
SET TUBE SMOKE EVAC HIGH FLOW (TUBING) ×3 IMPLANT
SLEEVE ENDOPATH XCEL 5M (ENDOMECHANICALS) ×3 IMPLANT
SPONGE GAUZE 2X2 8PLY STER LF (GAUZE/BANDAGES/DRESSINGS) ×1
SPONGE GAUZE 2X2 8PLY STRL LF (GAUZE/BANDAGES/DRESSINGS) ×2 IMPLANT
SUT MNCRL AB 4-0 PS2 18 (SUTURE) ×3 IMPLANT
SUT VICRYL PLUS ABS 0 54 (SUTURE) ×3 IMPLANT
TRAY FOLEY MTR SLVR 16FR STAT (SET/KITS/TRAYS/PACK) ×3 IMPLANT
TROCAR XCEL 12X100 BLDLESS (ENDOMECHANICALS) ×3 IMPLANT
TROCAR XCEL NON-BLD 5MMX100MML (ENDOMECHANICALS) ×3 IMPLANT

## 2018-11-06 NOTE — ED Notes (Signed)
ED TO INPATIENT HANDOFF REPORT  ED Nurse Name and Phone #: Anderson Malta [phone]  S Name/Age/Gender Molly Cisneros 33 y.o. female Room/Bed: ED48A/ED48A  Code Status   Code Status: Full Code  Home/SNF/Other Home Patient oriented to: self, place, time and situation Is this baseline? Yes   Triage Complete: Triage complete  Chief Complaint rt sided abd pain  Triage Note Pt arrived via POV with reports of abdominal pain that started in the umbilical region and moved down towards the right lower quadrant. Pt states she has has been having diarrhea for 2 months and has been evaluated by Dr. Marius Ditch for it.  Pt also reports N/V today as well.   Pt also states she has pain when the area is pushed on and released states the pain is there when the area is released.   Allergies Allergies  Allergen Reactions  . Shellfish Allergy Anaphylaxis  . Aloe Vera Dermatitis    Level of Care/Admitting Diagnosis ED Disposition    ED Disposition Condition [facility] Area: [place] [100120]  Level of Care: Med-Surg [16]  Covid Evaluation: Screening Protocol (No Symptoms)  Diagnosis: Acute appendicitis with localized peritonitis [154008]  Admitting Physician: Herbert Pun [[id number]]  Attending Physician: Herbert Pun [[id number]]  PT Class (Do Not Modify): Observation [104]  PT Acc Code (Do Not Modify): Observation [10022]       B Medical/Surgery History History reviewed. No pertinent past medical history. History reviewed. No pertinent surgical history.   A IV Location/Drains/Wounds Patient Lines/Drains/Airways Status   Active Line/Drains/Airways    Name:   Placement date:   Placement time:   Site:   Days:   Peripheral IV 11/06/18 Left Antecubital   11/06/18    1402    Antecubital   less than 1          Intake/Output Last 24 hours No intake or output data in the 24 hours ending 11/06/18 1750  Labs/Imaging Results for orders  placed or performed during the hospital encounter of 11/06/18 (from the past 48 hour(s))  Lipase, blood     Status: None   Collection Time: 11/06/18 11:48 AM  Result Value Ref Range   Lipase 23 11 - 51 U/L    Comment: Performed at [facility], [address] [place], [place] [address]  Comprehensive metabolic panel     Status: Abnormal   Collection Time: 11/06/18 11:48 AM  Result Value Ref Range   Sodium 133 (L) 135 - 145 mmol/L   Potassium 3.6 3.5 - 5.1 mmol/L   Chloride 103 98 - 111 mmol/L   CO2 20 (L) 22 - 32 mmol/L   Glucose, Bld 91 70 - 99 mg/dL   BUN 10 6 - 20 mg/dL   Creatinine, Ser 0.63 0.44 - 1.00 mg/dL   Calcium 8.6 (L) 8.9 - 10.3 mg/dL   Total Protein 7.5 6.5 - 8.1 g/dL   Albumin 4.4 3.5 - 5.0 g/dL   AST 18 15 - 41 U/L   ALT 23 0 - 44 U/L   Alkaline Phosphatase 56 38 - 126 U/L   Total Bilirubin 0.3 0.3 - 1.2 mg/dL   GFR calc non Af Amer >60 >60 mL/min   GFR calc Af Amer >60 >60 mL/min   Anion gap 10 5 - 15    Comment: Performed at [facility], [address] [place], [place] [address]  CBC     Status: Abnormal   Collection Time: 11/06/18 11:48  AM  Result Value Ref Range   WBC 20.1 (H) 4.0 - 10.5 K/uL   RBC 4.62 3.87 - 5.11 MIL/uL   Hemoglobin 13.7 12.0 - 15.0 g/dL   HCT 40.0 36.0 - 46.0 %   MCV 86.6 80.0 - 100.0 fL   MCH 29.7 26.0 - 34.0 pg   MCHC 34.3 30.0 - 36.0 g/dL   RDW 13.2 11.5 - 15.5 %   Platelets 441 (H) 150 - 400 K/uL   nRBC 0.0 0.0 - 0.2 %    Comment: Performed at [facility], [address] [place], [place] [address]  Urinalysis, Complete w Microscopic     Status: Abnormal   Collection Time: 11/06/18 11:48 AM  Result Value Ref Range   Color, Urine AMBER (A) YELLOW    Comment: BIOCHEMICALS MAY BE AFFECTED BY COLOR   APPearance CLOUDY (A) CLEAR   Specific Gravity, Urine 1.024 1.005 - 1.030   pH 6.0 5.0 - 8.0   Glucose, UA NEGATIVE NEGATIVE mg/dL   Hgb urine dipstick NEGATIVE NEGATIVE   Bilirubin Urine NEGATIVE  NEGATIVE   Ketones, ur NEGATIVE NEGATIVE mg/dL   Protein, ur 30 (A) NEGATIVE mg/dL   Nitrite NEGATIVE NEGATIVE   Leukocytes,Ua MODERATE (A) NEGATIVE   RBC / HPF 6-10 0 - 5 RBC/hpf   WBC, UA 21-50 0 - 5 WBC/hpf   Bacteria, UA FEW (A) NONE SEEN   Squamous Epithelial / LPF 21-50 0 - 5   Mucus PRESENT     Comment: Performed at [facility], [address] [place], [place] [address]  Pregnancy, urine POC     Status: None   Collection Time: 11/06/18 11:51 AM  Result Value Ref Range   Preg Test, Ur NEGATIVE NEGATIVE    Comment:        THE SENSITIVITY OF THIS METHODOLOGY IS >24 mIU/mL   SARS Coronavirus 2 (CEPHEID - Performed in [place] hospital lab), Hosp Order     Status: None   Collection Time: 11/06/18  4:24 PM   Specimen: Nasopharyngeal Swab  Result Value Ref Range   SARS Coronavirus 2 NEGATIVE NEGATIVE    Comment: (NOTE) If result is NEGATIVE SARS-CoV-2 target nucleic acids are NOT DETECTED. The SARS-CoV-2 RNA is generally detectable in upper and lower  respiratory specimens during the acute phase of infection. The lowest  concentration of SARS-CoV-2 viral copies this assay can detect is 250  copies / mL. A negative result does not preclude SARS-CoV-2 infection  and should not be used as the sole basis for treatment or other  patient management decisions.  A negative result may occur with  improper specimen collection / handling, submission of specimen other  than nasopharyngeal swab, presence of viral mutation(s) within the  areas targeted by this assay, and inadequate number of viral copies  (<250 copies / mL). A negative result must be combined with clinical  observations, patient history, and epidemiological information. If result is POSITIVE SARS-CoV-2 target nucleic acids are DETECTED. The SARS-CoV-2 RNA is generally detectable in upper and lower  respiratory specimens dur ing the acute phase of infection.  Positive  results are indicative of active  infection with SARS-CoV-2.  Clinical  correlation with patient history and other diagnostic information is  necessary to determine patient infection status.  Positive results do  not rule out bacterial infection or co-infection with other viruses. If result is PRESUMPTIVE POSTIVE SARS-CoV-2 nucleic acids MAY BE PRESENT.   A presumptive positive result was obtained on the  submitted specimen  and confirmed on repeat testing.  While 2019 novel coronavirus  (SARS-CoV-2) nucleic acids may be present in the submitted sample  additional confirmatory testing may be necessary for epidemiological  and / or clinical management purposes  to differentiate between  SARS-CoV-2 and other Sarbecovirus currently known to infect humans.  If clinically indicated additional testing with an alternate test  methodology [phone]) is advised. The SARS-CoV-2 RNA is generally  detectable in upper and lower respiratory sp ecimens during the acute  phase of infection. The expected result is Negative. Fact Sheet for Patients:  StrictlyIdeas.no Fact Sheet for Healthcare Providers: BankingDealers.co.za This test is not yet approved or cleared by the [place] FDA and has been authorized for detection and/or diagnosis of SARS-CoV-2 by FDA under an Emergency Use Authorization (EUA).  This EUA will remain in effect (meaning this test can be used) for the duration of the COVID-19 declaration under Section 564(b)(1) of the Act, 21 U.S.C. section 360bbb-3(b)(1), unless the authorization is terminated or revoked sooner. Performed at [facility], [address] [place], [place] [address]    Ct Abdomen Pelvis W Contrast  Result Date: 11/06/2018 CLINICAL DATA:  Periumbilical and right lower quadrant abdominal pain today. EXAM: CT ABDOMEN AND PELVIS WITH CONTRAST TECHNIQUE: Multidetector CT imaging of the abdomen and pelvis was performed using the standard  protocol following bolus administration of intravenous contrast. CONTRAST:  157mL OMNIPAQUE IOHEXOL 300 MG/ML  SOLN COMPARISON:  None. FINDINGS: Lower chest: The lung bases are clear of acute process. No pleural effusion or pulmonary lesions. The heart is normal in size. No pericardial effusion. The distal esophagus and aorta are unremarkable. Hepatobiliary: No focal hepatic lesions or intrahepatic biliary dilatation. The gallbladder is normal. No common bile duct dilatation. Pancreas: No mass, inflammation or ductal dilatation. Spleen: Normal size.  No focal lesions. Adrenals/Urinary Tract: The adrenal glands and kidneys are normal. The bladder is normal. Stomach/Bowel: The stomach, duodenum, small bowel and terminal ileum are unremarkable. There is moderate fluid in the colon which can be seen with diarrhea. No wall thickening, mass or obstruction. No diverticular disease. The mid distal aspect of the appendix appears dilated and inflamed with fairly extensive mucosal and serosal enhancement and periappendiceal interstitial changes. No appendicoliths and the appendix is not fluid-filled. Vascular/Lymphatic: The aorta is normal in caliber. No dissection. The branch vessels are patent. The major venous structures are patent. No mesenteric or retroperitoneal mass or adenopathy. Small scattered lymph nodes are noted. Reproductive: The uterus and ovaries are unremarkable. Other: No pelvic mass or adenopathy. No free pelvic fluid collections. No inguinal mass or adenopathy. No abdominal wall hernia or subcutaneous lesions. Musculoskeletal: No significant bony findings. IMPRESSION: 1. Enlarged and inflamed appendix with periappendiceal inflammatory changes also. No appendicoliths. 2. Moderate fluid in the colon. 3. No other significant abdominal/pelvic findings. Electronically Signed   By: Marijo Sanes M.D.   On: 11/06/2018 14:45    Pending Labs Unresulted Labs (From admission, onward)    Start     Ordered    11/06/18 1743  HIV antibody (Routine Testing)  Once,   STAT     11/06/18 1742          Vitals/Pain Today's Vitals   11/06/18 1210 11/06/18 1400 11/06/18 1431 11/06/18 1623  BP:    (!) 135/92  Pulse:    98  Resp:    18  Temp:      TempSrc:      SpO2:    100%  Weight: 108.9 kg     Height: 5\' 11"  (1.803 m)     PainSc:  8  4      Isolation Precautions No active isolations  Medications Medications  lisinopril-hydrochlorothiazide (ZESTORETIC) 20-25 MG per tablet 1 tablet (has no administration in time range)  ALPRAZolam (XANAX) tablet 1 mg (has no administration in time range)  amitriptyline (ELAVIL) tablet 25 mg (has no administration in time range)  amphetamine-dextroamphetamine (ADDERALL XR) 24 hr capsule 20 mg (has no administration in time range)  DULoxetine (CYMBALTA) DR capsule 60 mg (has no administration in time range)  dicyclomine (BENTYL) capsule 10 mg (has no administration in time range)  pantoprazole (PROTONIX) EC tablet 20 mg (has no administration in time range)  gabapentin (NEURONTIN) capsule 300 mg (has no administration in time range)  enoxaparin (LOVENOX) injection 40 mg (has no administration in time range)  0.9 %  sodium chloride infusion (has no administration in time range)  piperacillin-tazobactam (ZOSYN) IVPB 3.375 g (has no administration in time range)  acetaminophen (TYLENOL) tablet 650 mg (has no administration in time range)    Or  acetaminophen (TYLENOL) suppository 650 mg (has no administration in time range)  HYDROcodone-acetaminophen (NORCO/VICODIN) 5-325 MG per tablet 1-2 tablet (has no administration in time range)  morphine 4 MG/ML injection 4 mg (has no administration in time range)  ondansetron (ZOFRAN-ODT) disintegrating tablet 4 mg (has no administration in time range)    Or  ondansetron (ZOFRAN) injection 4 mg (has no administration in time range)  morphine 4 MG/ML injection 4 mg (4 mg Intravenous Given 11/06/18 1403)  ondansetron  (ZOFRAN) injection 4 mg (4 mg Intravenous Given 11/06/18 1403)  iohexol (OMNIPAQUE) 300 MG/ML solution 100 mL (100 mLs Intravenous Contrast Given 11/06/18 1415)  piperacillin-tazobactam (ZOSYN) IVPB 3.375 g (0 g Intravenous Stopped 11/06/18 1742)  morphine 4 MG/ML injection 4 mg (4 mg Intravenous Given 11/06/18 1616)    Mobility walks Low fall risk   Focused Assessments    R Recommendations: See Admitting Provider Note  Report given to:   Additional Notes:  Daughter of Dr. Otilio Miu

## 2018-11-06 NOTE — ED Provider Notes (Signed)
-----------------------------------------   3:41 PM on 11/06/2018 -----------------------------------------  Made aware of this patient at this time, this is beginning my shift, patient with acute appendicitis verified by CT scan, we are starting antibiotics, surgery has been consulted but are in the OR.  Test for coronavirus per policy.  Awaiting surgical input.   Schuyler Amor, MD 11/06/18 [phone]

## 2018-11-06 NOTE — ED Triage Notes (Addendum)
Pt arrived via POV with reports of abdominal pain that started in the umbilical region and moved down towards the right lower quadrant. Pt states she has has been having diarrhea for 2 months and has been evaluated by Dr. Marius Ditch for it.  Pt also reports N/V today as well.   Pt also states she has pain when the area is pushed on and released states the pain is there when the area is released.

## 2018-11-06 NOTE — Op Note (Signed)
Preoperative diagnosis: Acute appendicitis.  Postoperative diagnosis: Acute appendicitis  Procedure: Laparoscopic appendectomy.  Anesthesia: GETA  Surgeon: Dr. Windell Moment, MD  Wound Classification: Contaminated  Indications: Patient is a 33 y.o. female  presented with right lower quadrant pain of one day of duration, elevated WBC. Computed tomography scan and physical examination were consistent with acute appendicitis.   Findings: 1. Acutely inflamed appendix 2. No peri-appendiceal abscess or phlegmon 3. Normal anatomy 4. Adequate hemostasis.   Description of procedure: The patient was placed on the operating table in the supine position. General anesthesia was induced. A time-out was completed verifying correct patient, procedure, site, positioning, and implant(s) and/or special equipment prior to beginning this procedure. A Foley catheter and orogastric tubes were placed. The abdomen was prepped and draped in the usual sterile fashion.  An incision was made in a natural skin line above the umbilicus.   The fascia was elevated and the Veress needle inserted. Proper position was confirmed by aspiration and saline meniscus test. The abdomen was insufflated with carbon dioxide to a pressure of 15 mmHg. The patient tolerated insufflation well. A 5-mm optiview trocar was then inserted supraumbilically. The laparoscope was inserted and the abdomen inspected. No injuries from initial trocar placement were noted. Turbid fluid was noted in the right lower quadrant. Under direct visualization, an 12-mm trocar was inserted in the left lower quadrant lateral to the rectus muscle. A 5-mm port was then placed above the symphysis pubis on midline.  Care was taken to avoid injury to the bladder or inferior epigastric vessels. The table was placed in the Trendelenburg position with the right side elevated.  The cecum was gently grasped with an endoscopic graspers and pulled toward (the left upper  quadrant). An atraumatic grasper was then passed through the suprapubic port and omentum was dissected away until the appendix was identified. The appendix was then grasped and elevated. It was noted to be inflamed.  An endoscopic linear cutting stapler was then used to divide and staple the base of the appendix. The mesoappendix was divided with LigaSure.   The appendix was placed in an endoscopic retrieval bag and removed.  The appendiceal stump was then irrigated and hemostasis was assured. Fluid was suctioned and no other pathology was identified.  Secondary trocars were removed under direct vision. No bleeding was noted. The laparoscope was withdrawn and the umbilical trocar removed. The abdomen was allowed to collapse. All trocar sites greater than 5 mm were closed with Vicryl 0. The skin was closed with subcuticular sutures Monocryl 3-0 of and steristrips.  The patient tolerated the procedure well and was taken to the postanesthesia care unit in satisfactory condition.   Specimen: Appendix  Complications: None  Estimated Blood Loss: 10 mL

## 2018-11-06 NOTE — Anesthesia Procedure Notes (Signed)
Procedure Name: Intubation Performed by: Lesle Reek, CRNA Pre-anesthesia Checklist: Patient identified, Emergency Drugs available, Suction available, Patient being monitored and Timeout performed Patient Re-evaluated:Patient Re-evaluated prior to induction Oxygen Delivery Method: Circle system utilized Preoxygenation: Pre-oxygenation with 100% oxygen Induction Type: IV induction and Rapid sequence Laryngoscope Size: Mac and 4 Grade View: Grade II Tube size: 7.0 mm Number of attempts: 1 Airway Equipment and Method: Stylet Placement Confirmation: ETT inserted through vocal cords under direct vision,  positive ETCO2,  breath sounds checked- equal and bilateral and CO2 detector Secured at: 20 cm Tube secured with: Tape

## 2018-11-06 NOTE — ED Notes (Signed)
Patient transported to CT 

## 2018-11-06 NOTE — Transfer of Care (Signed)
Immediate Anesthesia Transfer of Care Note  Patient: Molly Cisneros  Procedure(s) Performed: APPENDECTOMY LAPAROSCOPIC (N/A )  Patient Location: PACU  Anesthesia Type:General  Level of Consciousness: awake and sedated  Airway & Oxygen Therapy: Patient Spontanous Breathing and Patient connected to face mask oxygen  Post-op Assessment: Report given to RN  Post vital signs: Reviewed and stable  Last Vitals:  Vitals Value Taken Time  BP 138/96 11/06/18 2140  Temp 36.6 C 11/06/18 2140  Pulse 112 11/06/18 2141  Resp 21 11/06/18 2141  SpO2 100 % 11/06/18 2141  Vitals shown include unvalidated device data.  Last Pain:  Vitals:   11/06/18 1857  TempSrc:   PainSc: 8       Patients Stated Pain Goal: 0 (92/11/94 1740)  Complications: No apparent anesthesia complications

## 2018-11-06 NOTE — Anesthesia Post-op Follow-up Note (Signed)
Anesthesia QCDR form completed.        

## 2018-11-06 NOTE — ED Provider Notes (Signed)
[facility] Emergency Department Provider Note   ____________________________________________   First MD Initiated Contact with Patient 11/06/18 1400     (approximate)  I have reviewed the triage vital signs and the nursing notes.   HISTORY  Chief Complaint Abdominal Pain    HPI Molly Cisneros is a 33 y.o. female patient presents with abdominal pain which started 2 days ago.  Patient state pain started in the umbilical area and has radiated to the right lower quadrant.  Patient has moderate guarding with palpation.  Patient also has been having diarrhea for 2 months.  Patient is seen Dr. Marius Ditch for the diarrhea.  Patient reports nausea and vomiting today.  Patient denies fever associated with this complaint.  Patient states nausea has prevented her from eating today.  Patient rates the pain is 8/10.  Patient described the pain as "sharp".  No palliative measures prior to arrival.         History reviewed. No pertinent past medical history.  Patient Active Problem List   Diagnosis Date Noted  . Essential hypertension 11/03/2018  . Hyperlipidemia, mixed 11/03/2018  . Degeneration of C5-C6 intervertebral disc 08/31/2018  . Herniation of left side of L4-L5 intervertebral disc 06/05/2017  . Obesity (BMI 30-39.9) 06/05/2017  . Chronic left-sided low back pain without sciatica 07/23/2015    History reviewed. No pertinent surgical history.  Prior to Admission medications   Medication Sig Start Date End Date Taking? Authorizing Provider  ALPRAZolam Duanne Moron) 1 MG tablet Take 1 mg by mouth at bedtime as needed for anxiety.    [provider]  amitriptyline (ELAVIL) 25 MG tablet Take 1 tablet (25 mg total) by mouth at bedtime. 10/13/18 12/12/18  Lin Landsman, MD  amphetamine-dextroamphetamine (ADDERALL XR) 20 MG 24 hr capsule Take 20 mg by mouth 2 (two) times daily.     [provider]  amphetamine-dextroamphetamine (ADDERALL XR) 30 MG  24 hr capsule  09/27/18   [provider]  atorvastatin (LIPITOR) 20 MG tablet Take 1 tablet (20 mg total) by mouth daily. 08/19/18   Juline Patch, MD  butalbital-acetaminophen-caffeine (FIORICET, ESGIC) [id number] MG tablet Take 1 tablet by mouth daily as needed for headache. Patient not taking: Reported on 10/13/2018 08/31/18 08/31/19  Glean Hess, MD  cyclobenzaprine (FLEXERIL) 10 MG tablet  08/27/18   [provider]  dicyclomine (BENTYL) 10 MG capsule Take 1 capsule (10 mg total) by mouth 4 (four) times daily -  before meals and at bedtime for 30 days. 10/13/18 11/12/18  Lin Landsman, MD  DULoxetine (CYMBALTA) 60 MG capsule Take 60 mg by mouth daily.  07/18/15   [provider]  etonogestrel-ethinyl estradiol (NUVARING) 0.12-0.015 MG/24HR vaginal ring USE AS DIRECTED BY DOCTOR 11/02/17   Juline Patch, MD  gabapentin (NEURONTIN) 300 MG capsule Take 1 capsule (300 mg total) by mouth 2 (two) times daily. 09/01/18   Juline Patch, MD  hydrOXYzine (VISTARIL) 25 MG capsule TAKE 1-2 CAPSULES BY MOUTH EVERY 6 HOURS. 09/08/18   Juline Patch, MD  lisinopril-hydrochlorothiazide (ZESTORETIC) 20-25 MG tablet TAKE (1) TABLET BY MOUTH EVERY DAY 11/03/18   Glean Hess, MD  nystatin (NYSTATIN) powder Apply topically 4 (four) times daily. Patient not taking: Reported on 10/13/2018 09/06/18   Juline Patch, MD  nystatin cream (MYCOSTATIN) Apply 1 application topically 2 (two) times daily. Patient not taking: Reported on 10/13/2018 09/06/18   Juline Patch, MD  ondansetron (ZOFRAN) 4 MG tablet  Take 1 tablet (4 mg total) by mouth every 8 (eight) hours as needed for nausea or vomiting. 10/14/18   Vanga, Tally Due, MD  pantoprazole (PROTONIX) 20 MG tablet Take 20 mg by mouth 2 (two) times daily.    [provider]  pantoprazole (PROTONIX) 40 MG tablet  10/07/18   [provider]  promethazine (PHENERGAN) 25 MG tablet TAKE (1) TABLET BY MOUTH EVERY 8 HOURS  AS NEEDED FOR NAUSEA OR VOMITING Patient not taking: Reported on 10/13/2018 08/27/18   Glean Hess, MD  valACYclovir (VALTREX) 1000 MG tablet Take 1 tablet (1,000 mg total) by mouth 2 (two) times daily as needed. 10/26/18   Glean Hess, MD    Allergies Shellfish allergy and Aloe vera  Family History  Problem Relation Age of Onset  . Non-Hodgkin's lymphoma Father 5       Basil Cell  . Pancreatic cancer Maternal Grandmother 31  . Thyroid cancer Maternal Grandmother 86  . Throat cancer Paternal Grandfather 7    Social History Social History   Tobacco Use  . Smoking status: Never Smoker  . Smokeless tobacco: Never Used  Substance Use Topics  . Alcohol use: Yes  . Drug use: No    Review of Systems Constitutional: No fever/chills Eyes: No visual changes. ENT: No sore throat. Cardiovascular: Denies chest pain. Respiratory: Denies shortness of breath. Gastrointestinal: No pain..  Nausea and vomiting.  Chronic diarrhea.  No constipation. Genitourinary: Negative for dysuria. Musculoskeletal: Negative for back pain. Skin: Negative for rash. Neurological: Negative for headaches, focal weakness or numbness. Endocrine:  Hypertension hyperlipidemia. Allergic/Immunilogical: Shellfish. ____________________________________________   PHYSICAL EXAM:  VITAL SIGNS: ED Triage Vitals  Enc Vitals Group     BP 11/06/18 1147 (!) 141/98     Pulse Rate 11/06/18 1147 96     Resp 11/06/18 1147 19     Temp 11/06/18 1147 98.3 F (36.8 C)     Temp Source 11/06/18 1147 Oral     SpO2 11/06/18 1147 100 %     Weight 11/06/18 1210 240 lb (108.9 kg)     Height 11/06/18 1210 5\' 11"  (1.803 m)     Head Circumference --      Peak Flow --      Pain Score 11/06/18 1209 8     Pain Loc --      Pain Edu? --      Excl. in [place]? --    Constitutional: Alert and oriented. Well appearing and in no acute distress. Hematological/Lymphatic/Immunilogical: No cervical lymphadenopathy. Cardiovascular:  Normal rate, regular rhythm. Grossly normal heart sounds.  Good peripheral circulation. Respiratory: Normal respiratory effort.  No retractions. Lungs CTAB. Gastrointestinal: Hyperactive bowel sounds.  Moderate rebound with right lower quadrant.  No distention. No abdominal bruits. No CVA tenderness. Musculoskeletal: No lower extremity tenderness nor edema.  No joint effusions. Neurologic:  Normal speech and language. No gross focal neurologic deficits are appreciated. No gait instability. Skin:  Skin is warm, dry and intact. No rash noted. Psychiatric: Mood and affect are normal. Speech and behavior are normal.  ____________________________________________   LABS (all labs ordered are listed, but only abnormal results are displayed)  Labs Reviewed  COMPREHENSIVE METABOLIC PANEL - Abnormal; Notable for the following components:      Result Value   Sodium 133 (*)    CO2 20 (*)    Calcium 8.6 (*)    All other components within normal limits  CBC - Abnormal; Notable for the following components:  WBC 20.1 (*)    Platelets 441 (*)    All other components within normal limits  URINALYSIS, COMPLETE (UACMP) WITH MICROSCOPIC - Abnormal; Notable for the following components:   Color, Urine AMBER (*)    APPearance CLOUDY (*)    Protein, ur 30 (*)    Leukocytes,Ua MODERATE (*)    Bacteria, UA FEW (*)    All other components within normal limits  LIPASE, BLOOD  POCT PREGNANCY, URINE  POC URINE PREG, ED   ____________________________________________  EKG   ____________________________________________  RADIOLOGY  ED MD interpretation:    Official radiology report(s): Ct Abdomen Pelvis W Contrast  Result Date: 11/06/2018 CLINICAL DATA:  Periumbilical and right lower quadrant abdominal pain today. EXAM: CT ABDOMEN AND PELVIS WITH CONTRAST TECHNIQUE: Multidetector CT imaging of the abdomen and pelvis was performed using the standard protocol following bolus administration of  intravenous contrast. CONTRAST:  187mL OMNIPAQUE IOHEXOL 300 MG/ML  SOLN COMPARISON:  None. FINDINGS: Lower chest: The lung bases are clear of acute process. No pleural effusion or pulmonary lesions. The heart is normal in size. No pericardial effusion. The distal esophagus and aorta are unremarkable. Hepatobiliary: No focal hepatic lesions or intrahepatic biliary dilatation. The gallbladder is normal. No common bile duct dilatation. Pancreas: No mass, inflammation or ductal dilatation. Spleen: Normal size.  No focal lesions. Adrenals/Urinary Tract: The adrenal glands and kidneys are normal. The bladder is normal. Stomach/Bowel: The stomach, duodenum, small bowel and terminal ileum are unremarkable. There is moderate fluid in the colon which can be seen with diarrhea. No wall thickening, mass or obstruction. No diverticular disease. The mid distal aspect of the appendix appears dilated and inflamed with fairly extensive mucosal and serosal enhancement and periappendiceal interstitial changes. No appendicoliths and the appendix is not fluid-filled. Vascular/Lymphatic: The aorta is normal in caliber. No dissection. The branch vessels are patent. The major venous structures are patent. No mesenteric or retroperitoneal mass or adenopathy. Small scattered lymph nodes are noted. Reproductive: The uterus and ovaries are unremarkable. Other: No pelvic mass or adenopathy. No free pelvic fluid collections. No inguinal mass or adenopathy. No abdominal wall hernia or subcutaneous lesions. Musculoskeletal: No significant bony findings. IMPRESSION: 1. Enlarged and inflamed appendix with periappendiceal inflammatory changes also. No appendicoliths. 2. Moderate fluid in the colon. 3. No other significant abdominal/pelvic findings. Electronically Signed   By: Marijo Sanes M.D.   On: 11/06/2018 14:45    ____________________________________________   PROCEDURES  Procedure(s) performed (including Critical Care):  Procedures    ____________________________________________   INITIAL IMPRESSION / ASSESSMENT AND PLAN / ED COURSE  As part of my medical decision making, I reviewed the following data within the [facility] [place]     Patient with with abdominal pain.  Differential consist of acute abdomen versus UTI.  Patient be further evaluated with CT scan.   Patient presents with right lower quadrant pain.  Patient abdominal pain started yesterday in umbilical area.  Patient pain has migrated to right lower quadrant.  Patient  moderate guarding with palpation of the right lower quadrant.  Patient has elevated white blood count.  Discussed patient with Dr. Peyton Najjar who will come to evaluate patient for admission.     ____________________________________________   FINAL CLINICAL IMPRESSION(S) / ED DIAGNOSES  Final diagnoses:  Appendicitis, unspecified appendicitis type     ED Discharge Orders    None       Note:  This document was prepared using Dragon voice recognition software and may include unintentional  dictation errors.    Sable Feil, PA-C 11/06/18 1512    Lavonia Drafts, MD 11/06/18 [phone]

## 2018-11-06 NOTE — Anesthesia Preprocedure Evaluation (Signed)
Anesthesia Evaluation  Patient identified by MRN, date of birth, ID band Patient awake    Reviewed: Allergy & Precautions, NPO status , Patient's Chart, lab work & pertinent test results  Airway Mallampati: III  TM Distance: <3 FB     Dental  (+) Teeth Intact   Pulmonary neg pulmonary ROS,    Pulmonary exam normal        Cardiovascular hypertension, Pt. on medications Normal cardiovascular exam     Neuro/Psych negative neurological ROS  negative psych ROS   GI/Hepatic Neg liver ROS,   Endo/Other  negative endocrine ROS  Renal/GU negative Renal ROS  negative genitourinary   Musculoskeletal  (+) Arthritis , Osteoarthritis,    Abdominal Normal abdominal exam  (+)   Peds negative pediatric ROS (+)  Hematology negative hematology ROS (+)   Anesthesia Other Findings   Reproductive/Obstetrics                             Anesthesia Physical Anesthesia Plan  ASA: II and emergent  Anesthesia Plan: General   Post-op Pain Management:    Induction: Intravenous, Rapid sequence and Cricoid pressure planned  PONV Risk Score and Plan:   Airway Management Planned: Oral ETT  Additional Equipment:   Intra-op Plan:   Post-operative Plan:   Informed Consent: I have reviewed the patients History and Physical, chart, labs and discussed the procedure including the risks, benefits and alternatives for the proposed anesthesia with the patient or authorized representative who has indicated his/her understanding and acceptance.     Dental advisory given  Plan Discussed with: CRNA and Surgeon  Anesthesia Plan Comments:         Anesthesia Quick Evaluation

## 2018-11-06 NOTE — H&P (Signed)
SURGICAL HISTORY AND PHYSICAL NOTE   HISTORY OF PRESENT ILLNESS (HPI):  33 y.o. female presented to [facility] ED for evaluation of abdominal pain. Patient reports starting with abdominal pain since last night.  She reported the pain started on the periumbilical area and radiated to the right lower quadrant.  Pain now localized in the right upper lower quadrant and does not radiate to other part of the body.  Aggravating factor is applying pressure in the abdomen.  There is no alleviating factors.  Patient denies fever chills.  Patient states diarrhea but has been without it since 2 months ago.  She was sent there for multiple GI condition including Crohn's and he was negative.  At present the etiology of the diarrhea is unknown.  She came to the ED and was found with leukocytosis.  CT scan of the abdomen and pelvis shows inflammation of the appendix.  I personally evaluated the images.  Surgery is consulted by Dr. Corky Downs in this context for evaluation and management of acute appendicitis.  PAST MEDICAL HISTORY ([facility]):  Anxiety Hyperlipidemia Hypertension  PAST SURGICAL HISTORY ([place]):  History reviewed. No pertinent surgical history.   MEDICATIONS:  Prior to Admission medications   Medication Sig Start Date End Date Taking? Authorizing Provider  ALPRAZolam Duanne Moron) 1 MG tablet Take 1 mg by mouth at bedtime as needed for anxiety.    [provider]  amitriptyline (ELAVIL) 25 MG tablet Take 1 tablet (25 mg total) by mouth at bedtime. 10/13/18 12/12/18  Lin Landsman, MD  amphetamine-dextroamphetamine (ADDERALL XR) 20 MG 24 hr capsule Take 20 mg by mouth 2 (two) times daily.     [provider]  amphetamine-dextroamphetamine (ADDERALL XR) 30 MG 24 hr capsule  09/27/18   [provider]  atorvastatin (LIPITOR) 20 MG tablet Take 1 tablet (20 mg total) by mouth daily. 08/19/18   Juline Patch, MD  butalbital-acetaminophen-caffeine (FIORICET, ESGIC) [phone] MG tablet Take  1 tablet by mouth daily as needed for headache. Patient not taking: Reported on 10/13/2018 08/31/18 08/31/19  Glean Hess, MD  cyclobenzaprine (FLEXERIL) 10 MG tablet  08/27/18   [provider]  dicyclomine (BENTYL) 10 MG capsule Take 1 capsule (10 mg total) by mouth 4 (four) times daily -  before meals and at bedtime for 30 days. 10/13/18 11/12/18  Lin Landsman, MD  DULoxetine (CYMBALTA) 60 MG capsule Take 60 mg by mouth daily.  07/18/15   [provider]  etonogestrel-ethinyl estradiol (NUVARING) 0.12-0.015 MG/24HR vaginal ring USE AS DIRECTED BY DOCTOR 11/02/17   Juline Patch, MD  gabapentin (NEURONTIN) 300 MG capsule Take 1 capsule (300 mg total) by mouth 2 (two) times daily. 09/01/18   Juline Patch, MD  hydrOXYzine (VISTARIL) 25 MG capsule TAKE 1-2 CAPSULES BY MOUTH EVERY 6 HOURS. 09/08/18   Juline Patch, MD  lisinopril-hydrochlorothiazide (ZESTORETIC) 20-25 MG tablet TAKE (1) TABLET BY MOUTH EVERY DAY 11/03/18   Glean Hess, MD  nystatin (NYSTATIN) powder Apply topically 4 (four) times daily. Patient not taking: Reported on 10/13/2018 09/06/18   Juline Patch, MD  nystatin cream (MYCOSTATIN) Apply 1 application topically 2 (two) times daily. Patient not taking: Reported on 10/13/2018 09/06/18   Juline Patch, MD  ondansetron (ZOFRAN) 4 MG tablet Take 1 tablet (4 mg total) by mouth every 8 (eight) hours as needed for nausea or vomiting. 10/14/18   Vanga, Tally Due, MD  pantoprazole (PROTONIX) 20 MG tablet Take 20 mg by mouth 2 (  two) times daily.    [provider]  pantoprazole (PROTONIX) 40 MG tablet  10/07/18   [provider]  promethazine (PHENERGAN) 25 MG tablet TAKE (1) TABLET BY MOUTH EVERY 8 HOURS AS NEEDED FOR NAUSEA OR VOMITING Patient not taking: Reported on 10/13/2018 08/27/18   Glean Hess, MD  valACYclovir (VALTREX) 1000 MG tablet Take 1 tablet (1,000 mg total) by mouth 2 (two) times daily as needed. 10/26/18   Glean Hess, MD     ALLERGIES:  Allergies  Allergen Reactions  . Shellfish Allergy Anaphylaxis  . Aloe Vera Dermatitis     SOCIAL HISTORY:  Social History   Socioeconomic History  . Marital status: Single    Spouse name: Not on file  . Number of children: Not on file  . Years of education: Not on file  . Highest education level: Not on file  Occupational History  . Not on file  Social Needs  . Financial resource strain: Not on file  . Food insecurity    Worry: Not on file    Inability: Not on file  . Transportation needs    Medical: Not on file    Non-medical: Not on file  Tobacco Use  . Smoking status: Never Smoker  . Smokeless tobacco: Never Used  Substance and Sexual Activity  . Alcohol use: Yes  . Drug use: No  . Sexual activity: Not Currently    Birth control/protection: Inserts  Lifestyle  . Physical activity    Days per week: Not on file    Minutes per session: Not on file  . Stress: Not on file  Relationships  . Social Herbalist on phone: Not on file    Gets together: Not on file    Attends religious service: Not on file    Active member of club or organization: Not on file    Attends meetings of clubs or organizations: Not on file    Relationship status: Not on file  . Intimate partner violence    Fear of current or ex partner: Not on file    Emotionally abused: Not on file    Physically abused: Not on file    Forced sexual activity: Not on file  Other Topics Concern  . Not on file  Social History Narrative  . Not on file    The patient currently resides (home / rehab facility / nursing home): Home The patient normally is (ambulatory / bedbound): Ambulatory   FAMILY HISTORY:  Family History  Problem Relation Age of Onset  . Non-Hodgkin's lymphoma Father 30       Basil Cell  . Pancreatic cancer Maternal Grandmother 22  . Thyroid cancer Maternal Grandmother 78  . Throat cancer Paternal Grandfather 88     REVIEW OF SYSTEMS:   Constitutional: denies weight loss, fever, chills, or sweats  Eyes: denies any other vision changes, history of eye injury  ENT: denies sore throat, hearing problems  Respiratory: denies shortness of breath, wheezing  Cardiovascular: denies chest pain, palpitations  Gastrointestinal: positive abdominal pain, nausea and vomiting.  Positive for diarrhea Genitourinary: denies burning with urination or urinary frequency Musculoskeletal: denies any other joint pains or cramps  Skin: denies any other rashes or skin discolorations  Neurological: denies any other headache, dizziness, weakness  Psychiatric: denies any other depression, anxiety   All other review of systems were negative   VITAL SIGNS:  Temp:  [98.3 F (36.8 C)] 98.3 F (36.8 C) (06/20  1147) Pulse Rate:  [96-98] 98 (06/20 1623) Resp:  [18-19] 18 (06/20 1623) BP: (135-141)/(92-98) 135/92 (06/20 1623) SpO2:  [100 %] 100 % (06/20 1623) Weight:  [108.9 kg] 108.9 kg (06/20 1210)     Height: 5\' 11"  (180.3 cm) Weight: 108.9 kg BMI (Calculated): 33.49   INTAKE/OUTPUT:  This shift: No intake/output data recorded.  Last 2 shifts: @IOLAST2SHIFTS @   PHYSICAL EXAM:  Constitutional:  -- Normal body habitus  -- Awake, alert, and oriented x3  Eyes:  -- Pupils equally round and reactive to light  -- No scleral icterus  Ear, nose, and throat:  -- No jugular venous distension  Pulmonary:  -- No crackles  -- Equal breath sounds bilaterally -- Breathing non-labored at rest Cardiovascular:  -- S1, S2 present  -- No pericardial rubs Gastrointestinal:  -- Abdomen soft, tender to palpation in right lower quadrant, non-distended, no guarding or rebound tenderness -- No abdominal masses appreciated, pulsatile or otherwise  Musculoskeletal and Integumentary:  -- Wounds or skin discoloration: None appreciated -- Extremities: B/L UE and LE FROM, hands and feet warm, no edema  Neurologic:  -- Motor function: intact and symmetric --  Sensation: intact and symmetric   Labs:  CBC Latest Ref Rng & Units 11/06/2018 10/15/2018 04/14/2018  WBC 4.0 - 10.5 K/uL 20.1(H) 10.5 6.9  Hemoglobin 12.0 - 15.0 g/dL 13.7 13.3 12.2  Hematocrit 36.0 - 46.0 % 40.0 39.5 36.0  Platelets 150 - 400 K/uL 441(H) 452(H) 371   CMP Latest Ref Rng & Units 11/06/2018 10/15/2018  Glucose 70 - 99 mg/dL 91 94  BUN 6 - 20 mg/dL 10 11  Creatinine 0.44 - 1.00 mg/dL 0.63 0.83  Sodium 135 - 145 mmol/L 133(L) 136  Potassium 3.5 - 5.1 mmol/L 3.6 4.5  Chloride 98 - 111 mmol/L 103 102  CO2 22 - 32 mmol/L 20(L) 17(L)  Calcium 8.9 - 10.3 mg/dL 8.6(L) 9.7  Total Protein 6.5 - 8.1 g/dL 7.5 6.8  Total Bilirubin 0.3 - 1.2 mg/dL 0.3 <0.2  Alkaline Phos 38 - 126 U/L 56 59  AST 15 - 41 U/L 18 15  ALT 0 - 44 U/L 23 24   Imaging studies:  EXAM: CT ABDOMEN AND PELVIS WITH CONTRAST  TECHNIQUE: Multidetector CT imaging of the abdomen and pelvis was performed using the standard protocol following bolus administration of intravenous contrast.  CONTRAST:  165mL OMNIPAQUE IOHEXOL 300 MG/ML  SOLN  COMPARISON:  None.  FINDINGS: Lower chest: The lung bases are clear of acute process. No pleural effusion or pulmonary lesions. The heart is normal in size. No pericardial effusion. The distal esophagus and aorta are unremarkable.  Hepatobiliary: No focal hepatic lesions or intrahepatic biliary dilatation. The gallbladder is normal. No common bile duct dilatation.  Pancreas: No mass, inflammation or ductal dilatation.  Spleen: Normal size.  No focal lesions.  Adrenals/Urinary Tract: The adrenal glands and kidneys are normal. The bladder is normal.  Stomach/Bowel: The stomach, duodenum, small bowel and terminal ileum are unremarkable.  There is moderate fluid in the colon which can be seen with diarrhea. No wall thickening, mass or obstruction. No diverticular disease.  The mid distal aspect of the appendix appears dilated and inflamed with  fairly extensive mucosal and serosal enhancement and periappendiceal interstitial changes. No appendicoliths and the appendix is not fluid-filled.  Vascular/Lymphatic: The aorta is normal in caliber. No dissection. The branch vessels are patent. The major venous structures are patent. No mesenteric or retroperitoneal mass or adenopathy. Small scattered lymph nodes  are noted.  Reproductive: The uterus and ovaries are unremarkable.  Other: No pelvic mass or adenopathy. No free pelvic fluid collections. No inguinal mass or adenopathy. No abdominal wall hernia or subcutaneous lesions.  Musculoskeletal: No significant bony findings.  IMPRESSION: 1. Enlarged and inflamed appendix with periappendiceal inflammatory changes also. No appendicoliths. 2. Moderate fluid in the colon. 3. No other significant abdominal/pelvic findings.   Electronically Signed   By: Marijo Sanes M.D.   On: 11/06/2018 14:45  Assessment/Plan:  33 y.o. female with appendicitis.  Patient with history, physical exam and images consistent with acute appendicitis. Patient oriented about diagnosis and surgical management as treatment. Patient oriented about goals of surgery and its risk including: bowel injury, infection, abscess, bleeding, leak from cecum, intestinal adhesions, bowel obstruction, fistula, injury to the ureter among others.  Patient was oriented that there is a low possibility that if cecum is inflamed we might not be able to take out the appendix.  This is suspected to the 64-month history of diarrhea.  Even though Crohn's was ruled out it might still a possibility.  She understood. Patient understood and agreed to proceed with surgery. Will admit patient, already started on antibiotic therapy, will give IV hydration since patient is NPO and schedule to OR.   Arnold Long, MD

## 2018-11-07 ENCOUNTER — Encounter: Payer: Self-pay | Admitting: General Surgery

## 2018-11-07 MED ORDER — PROMETHAZINE HCL 25 MG/ML IJ SOLN
12.5000 mg | Freq: Four times a day (QID) | INTRAMUSCULAR | Status: DC | PRN
  Administered 2018-11-07 – 2018-11-08 (×3): 12.5 mg via INTRAVENOUS
  Filled 2018-11-07 (×3): qty 1

## 2018-11-07 MED ORDER — ALPRAZOLAM 0.5 MG PO TABS
1.0000 mg | ORAL_TABLET | Freq: Two times a day (BID) | ORAL | Status: DC | PRN
  Administered 2018-11-07 – 2018-11-08 (×2): 1 mg via ORAL
  Filled 2018-11-07 (×2): qty 2

## 2018-11-07 NOTE — Progress Notes (Signed)
[place] [facility] Day(s): 0.   Post op day(s): 1 Day Post-Op.   Interval History: Patient seen and examined, no acute events or new complaints overnight. Patient reports feeling weak and nauseous.  She reports that she felt lightheaded when she tried to walk to the bathroom.  She did not want to try the breakfast because of the nausea.  Now at noon feeling a little bit hungry and reports that she will try lunch.  Vital signs in last 24 hours: [min-max] current  Temp:  [97.8 F (36.6 C)-98.6 F (37 C)] 98.6 F (37 C) (06/21 0524) Pulse Rate:  [91-116] 116 (06/21 0524) Resp:  [11-18] 18 (06/21 0035) BP: (117-157)/(72-106) 124/72 (06/21 1110) SpO2:  [87 %-100 %] 96 % (06/21 0524)     Height: 5\' 11"  (180.3 cm) Weight: 108.9 kg BMI (Calculated): 33.49   Physical Exam:  Constitutional: alert, cooperative and no distress  Respiratory: breathing non-labored at rest  Cardiovascular: regular rate and sinus rhythm  Gastrointestinal: soft, mild-tender, and non-distended  Labs:  CBC Latest Ref Rng & Units 11/06/2018 10/15/2018 04/14/2018  WBC 4.0 - 10.5 K/uL 20.1(H) 10.5 6.9  Hemoglobin 12.0 - 15.0 g/dL 13.7 13.3 12.2  Hematocrit 36.0 - 46.0 % 40.0 39.5 36.0  Platelets 150 - 400 K/uL 441(H) 452(H) 371   CMP Latest Ref Rng & Units 11/06/2018 10/15/2018  Glucose 70 - 99 mg/dL 91 94  BUN 6 - 20 mg/dL 10 11  Creatinine 0.44 - 1.00 mg/dL 0.63 0.83  Sodium 135 - 145 mmol/L 133(L) 136  Potassium 3.5 - 5.1 mmol/L 3.6 4.5  Chloride 98 - 111 mmol/L 103 102  CO2 22 - 32 mmol/L 20(L) 17(L)  Calcium 8.9 - 10.3 mg/dL 8.6(L) 9.7  Total Protein 6.5 - 8.1 g/dL 7.5 6.8  Total Bilirubin 0.3 - 1.2 mg/dL 0.3 <0.2  Alkaline Phos 38 - 126 U/L 56 59  AST 15 - 41 U/L 18 15  ALT 0 - 44 U/L 23 24    Imaging studies: No new pertinent imaging studies   Assessment/Plan:  33 y.o. female with acute appendicitis 1 Day Post-Op s/p laparoscopic appendectomy. Patient tolerated procedure well but  today with lightheadedness, weakness and nausea.  We will continue with nausea medication, support with IV fluids.  Patient will try full liquids now lunch.  Will assess for diet toleration.  We will continue with pain management.  Unable to discharge today due to the symptoms.  We will continue with DVT prophylaxis.  Arnold Long, MD

## 2018-11-07 NOTE — Anesthesia Postprocedure Evaluation (Signed)
Anesthesia Post Note  Patient: Molly Cisneros  Procedure(s) Performed: APPENDECTOMY LAPAROSCOPIC (N/A )  Patient location during evaluation: PACU Anesthesia Type: General Level of consciousness: awake and alert and oriented Pain management: pain level controlled Vital Signs Assessment: post-procedure vital signs reviewed and stable Respiratory status: spontaneous breathing Cardiovascular status: blood pressure returned to baseline Anesthetic complications: no     Last Vitals:  Vitals:   11/07/18 0035 11/07/18 0524  BP: 132/80 117/80  Pulse: (!) 104 (!) 116  Resp: 18   Temp: 36.6 C 37 C  SpO2: 96% 96%    Last Pain:  Vitals:   11/07/18 0811  TempSrc:   PainSc: 3                  Taelyn Nemes

## 2018-11-07 NOTE — Plan of Care (Signed)
Patient doing ok.  Patient has still had pain on and off today but the pain medication has helped.  Incisions C/D/I.  She has been nauseated which the medication has helped.  She has tolerated a little bit of the full liquid diet.  Ambulated in the hallway once.  No significant changes.  I spoke to patients father and gave him an update.

## 2018-11-08 MED ORDER — HYDROCODONE-ACETAMINOPHEN 5-325 MG PO TABS: 1.0000 | ORAL_TABLET | ORAL | 0 refills | Status: AC | PRN

## 2018-11-08 MED ORDER — CYCLOBENZAPRINE HCL 5 MG PO TABS: 5.0000 mg | ORAL_TABLET | Freq: Three times a day (TID) | ORAL | 0 refills | Status: DC

## 2018-11-08 MED ORDER — ONDANSETRON HCL 4 MG PO TABS: 4.0000 mg | ORAL_TABLET | Freq: Three times a day (TID) | ORAL | 0 refills | Status: DC | PRN

## 2018-11-08 MED ORDER — CYCLOBENZAPRINE HCL 10 MG PO TABS: 5.0000 mg | ORAL_TABLET | Freq: Three times a day (TID) | ORAL | Status: DC

## 2018-11-08 NOTE — Discharge Instructions (Signed)

## 2018-11-08 NOTE — Progress Notes (Signed)
11/08/2018 4:02 PM  Molly Cisneros to be D/C'd Home per MD order.  Discussed prescriptions and follow up appointments with the patient. Prescriptions given to patient, medication list explained in detail. Pt verbalized understanding.  Allergies as of 11/08/2018      Reactions   Shellfish Allergy Anaphylaxis   Aloe Vera Dermatitis      Medication List    TAKE these medications   ALPRAZolam 1 MG tablet Commonly known as: XANAX Take 1 mg by mouth at bedtime as needed for anxiety. Notes to patient: As needed   amitriptyline 25 MG tablet Commonly known as: ELAVIL Take 1 tablet (25 mg total) by mouth at bedtime. Notes to patient: Before bed 11/08/18   amphetamine-dextroamphetamine 30 MG 24 hr capsule Commonly known as: ADDERALL XR Notes to patient: As needed   amphetamine-dextroamphetamine 20 MG tablet Commonly known as: ADDERALL Take 20 mg by mouth 2 (two) times a day. Notes to patient: Before bed 11/08/18   atorvastatin 20 MG tablet Commonly known as: LIPITOR Take 1 tablet (20 mg total) by mouth daily. Notes to patient: Before bed 11/08/18   butalbital-acetaminophen-caffeine 50-325-40 MG tablet Commonly known as: FIORICET Take 1 tablet by mouth daily as needed for headache. Notes to patient: As needed   cephALEXin 500 MG capsule Commonly known as: KEFLEX Take 500 mg by mouth 2 (two) times a day. For 10 days Notes to patient: Before bed 11/08/18   cyclobenzaprine 10 MG tablet Commonly known as: FLEXERIL What changed: Another medication with the same name was added. Make sure you understand how and when to take each.   cyclobenzaprine 5 MG tablet Commonly known as: FLEXERIL Take 1 tablet (5 mg total) by mouth 3 (three) times daily for 5 days. What changed: You were already taking a medication with the same name, and this prescription was added. Make sure you understand how and when to take each. Notes to patient: Afternoon 11/08/18   dicyclomine 10 MG capsule Commonly  known as: BENTYL Take 1 capsule (10 mg total) by mouth 4 (four) times daily -  before meals and at bedtime for 30 days. Notes to patient: Before evening meal 11/08/18   DULoxetine 60 MG capsule Commonly known as: CYMBALTA Take 60 mg by mouth daily. Notes to patient: Morning 11/09/18   etonogestrel-ethinyl estradiol 0.12-0.015 MG/24HR vaginal ring Commonly known as: NuvaRing USE AS DIRECTED BY DOCTOR   gabapentin 300 MG capsule Commonly known as: NEURONTIN Take 1 capsule (300 mg total) by mouth 2 (two) times daily. Notes to patient: Before bed 11/08/18   HYDROcodone-acetaminophen 5-325 MG tablet Commonly known as: Norco Take 1 tablet by mouth every 4 (four) hours as needed for up to 3 days for moderate pain. Notes to patient: As needed   hydrOXYzine 25 MG capsule Commonly known as: VISTARIL TAKE 1-2 CAPSULES BY MOUTH EVERY 6 HOURS.   lisinopril-hydrochlorothiazide 20-25 MG tablet Commonly known as: ZESTORETIC TAKE (1) TABLET BY MOUTH EVERY DAY Notes to patient: Morning 11/09/18   nystatin cream Commonly known as: MYCOSTATIN Apply 1 application topically 2 (two) times daily. Notes to patient: Before bed 11/08/18   nystatin powder Commonly known as: nystatin Apply topically 4 (four) times daily. Notes to patient: Evening 11/08/18   ondansetron 4 MG tablet Commonly known as: ZOFRAN Take 1 tablet (4 mg total) by mouth every 8 (eight) hours as needed for nausea or vomiting. What changed: Another medication with the same name was added. Make sure you understand how and when to take  each.   ondansetron 4 MG tablet Commonly known as: Zofran Take 1 tablet (4 mg total) by mouth every 8 (eight) hours as needed for up to 3 days for nausea or vomiting. What changed: You were already taking a medication with the same name, and this prescription was added. Make sure you understand how and when to take each.   pantoprazole 20 MG tablet Commonly known as: PROTONIX Take 20 mg by mouth 2  (two) times daily. Notes to patient: Before bed 11/08/18   pantoprazole 40 MG tablet Commonly known as: PROTONIX   promethazine 25 MG tablet Commonly known as: PHENERGAN TAKE (1) TABLET BY MOUTH EVERY 8 HOURS AS NEEDED FOR NAUSEA OR VOMITING Notes to patient: As needed   valACYclovir 1000 MG tablet Commonly known as: VALTREX Take 1 tablet (1,000 mg total) by mouth 2 (two) times daily as needed. Notes to patient: Before bed 11/08/18       Vitals:   11/08/18 0600 11/08/18 1224  BP: 128/89 125/90  Pulse: (!) 105 (!) 107  Resp:    Temp: 98 F (36.7 C) 98.2 F (36.8 C)  SpO2: 94% 97%    Skin clean, dry and intact without evidence of skin break down, no evidence of skin tears noted. IV catheter discontinued intact. Site without signs and symptoms of complications. Dressing and pressure applied. Pt denies pain at this time. No complaints noted.  An After Visit Summary was printed and given to the patient. Patient escorted via [place], and D/C home via private auto.  Dola Argyle

## 2018-11-08 NOTE — Progress Notes (Signed)
11/08/2018 5:08 PM  Molly Cisneros to be D/C'd Home per MD order.  Discussed prescriptions and follow up appointments with the patient. Prescriptions given to patient, medication list explained in detail. Pt verbalized understanding.  Allergies as of 11/08/2018      Reactions   Shellfish Allergy Anaphylaxis   Aloe Vera Dermatitis      Medication List    TAKE these medications   ALPRAZolam 1 MG tablet Commonly known as: XANAX Take 1 mg by mouth at bedtime as needed for anxiety. Notes to patient: As needed   amitriptyline 25 MG tablet Commonly known as: ELAVIL Take 1 tablet (25 mg total) by mouth at bedtime. Notes to patient: Before bed 11/08/18   amphetamine-dextroamphetamine 30 MG 24 hr capsule Commonly known as: ADDERALL XR Notes to patient: As needed   amphetamine-dextroamphetamine 20 MG tablet Commonly known as: ADDERALL Take 20 mg by mouth 2 (two) times a day. Notes to patient: Before bed 11/08/18   atorvastatin 20 MG tablet Commonly known as: LIPITOR Take 1 tablet (20 mg total) by mouth daily. Notes to patient: Before bed 11/08/18   butalbital-acetaminophen-caffeine 50-325-40 MG tablet Commonly known as: FIORICET Take 1 tablet by mouth daily as needed for headache. Notes to patient: As needed   cephALEXin 500 MG capsule Commonly known as: KEFLEX Take 500 mg by mouth 2 (two) times a day. For 10 days Notes to patient: Before bed 11/08/18   cyclobenzaprine 10 MG tablet Commonly known as: FLEXERIL What changed: Another medication with the same name was added. Make sure you understand how and when to take each.   cyclobenzaprine 5 MG tablet Commonly known as: FLEXERIL Take 1 tablet (5 mg total) by mouth 3 (three) times daily for 5 days. What changed: You were already taking a medication with the same name, and this prescription was added. Make sure you understand how and when to take each. Notes to patient: Afternoon 11/08/18   dicyclomine 10 MG capsule Commonly  known as: BENTYL Take 1 capsule (10 mg total) by mouth 4 (four) times daily -  before meals and at bedtime for 30 days. Notes to patient: Before evening meal 11/08/18   DULoxetine 60 MG capsule Commonly known as: CYMBALTA Take 60 mg by mouth daily. Notes to patient: Morning 11/09/18   etonogestrel-ethinyl estradiol 0.12-0.015 MG/24HR vaginal ring Commonly known as: NuvaRing USE AS DIRECTED BY DOCTOR   gabapentin 300 MG capsule Commonly known as: NEURONTIN Take 1 capsule (300 mg total) by mouth 2 (two) times daily. Notes to patient: Before bed 11/08/18   HYDROcodone-acetaminophen 5-325 MG tablet Commonly known as: Norco Take 1 tablet by mouth every 4 (four) hours as needed for up to 3 days for moderate pain. Notes to patient: As needed   hydrOXYzine 25 MG capsule Commonly known as: VISTARIL TAKE 1-2 CAPSULES BY MOUTH EVERY 6 HOURS.   lisinopril-hydrochlorothiazide 20-25 MG tablet Commonly known as: ZESTORETIC TAKE (1) TABLET BY MOUTH EVERY DAY Notes to patient: Morning 11/09/18   nystatin cream Commonly known as: MYCOSTATIN Apply 1 application topically 2 (two) times daily. Notes to patient: Before bed 11/08/18   nystatin powder Commonly known as: nystatin Apply topically 4 (four) times daily. Notes to patient: Evening 11/08/18   ondansetron 4 MG tablet Commonly known as: ZOFRAN Take 1 tablet (4 mg total) by mouth every 8 (eight) hours as needed for nausea or vomiting. What changed: Another medication with the same name was added. Make sure you understand how and when to take  each.   ondansetron 4 MG tablet Commonly known as: Zofran Take 1 tablet (4 mg total) by mouth every 8 (eight) hours as needed for up to 3 days for nausea or vomiting. What changed: You were already taking a medication with the same name, and this prescription was added. Make sure you understand how and when to take each.   pantoprazole 20 MG tablet Commonly known as: PROTONIX Take 20 mg by mouth 2  (two) times daily. Notes to patient: Before bed 11/08/18   pantoprazole 40 MG tablet Commonly known as: PROTONIX   promethazine 25 MG tablet Commonly known as: PHENERGAN TAKE (1) TABLET BY MOUTH EVERY 8 HOURS AS NEEDED FOR NAUSEA OR VOMITING Notes to patient: As needed   valACYclovir 1000 MG tablet Commonly known as: VALTREX Take 1 tablet (1,000 mg total) by mouth 2 (two) times daily as needed. Notes to patient: Before bed 11/08/18       Vitals:   11/08/18 0600 11/08/18 1224  BP: 128/89 125/90  Pulse: (!) 105 (!) 107  Resp:    Temp: 98 F (36.7 C) 98.2 F (36.8 C)  SpO2: 94% 97%    Skin clean, dry and intact without evidence of skin break down, no evidence of skin tears noted. IV catheter discontinued intact. Site without signs and symptoms of complications. Dressing and pressure applied. Pt denies pain at this time. No complaints noted.  An After Visit Summary was printed and given to the patient. Patient escorted via [place], and D/C home via private auto.  Dola Argyle

## 2018-11-08 NOTE — Discharge Summary (Signed)
Patient ID: Molly Cisneros MRN: [id number] DOB/AGE: June 05, 1985 33 y.o.  Admit date: 11/06/2018 Discharge date: 11/08/2018   Discharge Diagnoses:  Active Problems:   Acute appendicitis with localized peritonitis   Procedures: Laparoscopic appendectomy  Hospital Course: Patient with acute appendicitis.  She underwent laparoscopic appendectomy.  She developed postoperatively nausea.  I treated the patient with antinausea medication.  Pain was better controlled with muscle relaxant.  Today she tolerated breakfast and lunch.  Patient continue with diarrhea, this is a chronic condition that is followed by GI.  Physical Exam  Constitutional: She is oriented to person, place, and time and well-developed, well-nourished, and in no distress.  Cardiovascular: Normal rate and regular rhythm.  Pulmonary/Chest: Effort normal.  Abdominal: Soft. She exhibits no distension. There is no abdominal tenderness.  Neurological: She is alert and oriented to person, place, and time.  Skin: Skin is warm.  Wounds are dry and clean   Consults: None  Disposition: Discharge disposition: 01-Home or Self Care       Discharge Instructions    Diet - low sodium heart healthy   Complete by: As directed      Allergies as of 11/08/2018      Reactions   Shellfish Allergy Anaphylaxis   Aloe Vera Dermatitis      Medication List    TAKE these medications   ALPRAZolam 1 MG tablet Commonly known as: XANAX Take 1 mg by mouth at bedtime as needed for anxiety.   amitriptyline 25 MG tablet Commonly known as: ELAVIL Take 1 tablet (25 mg total) by mouth at bedtime.   amphetamine-dextroamphetamine 30 MG 24 hr capsule Commonly known as: ADDERALL XR   amphetamine-dextroamphetamine 20 MG tablet Commonly known as: ADDERALL Take 20 mg by mouth 2 (two) times a day.   atorvastatin 20 MG tablet Commonly known as: LIPITOR Take 1 tablet (20 mg total) by mouth daily.   butalbital-acetaminophen-caffeine  50-325-40 MG tablet Commonly known as: FIORICET Take 1 tablet by mouth daily as needed for headache.   cephALEXin 500 MG capsule Commonly known as: KEFLEX Take 500 mg by mouth 2 (two) times a day. For 10 days   cyclobenzaprine 10 MG tablet Commonly known as: FLEXERIL What changed: Another medication with the same name was added. Make sure you understand how and when to take each.   cyclobenzaprine 5 MG tablet Commonly known as: FLEXERIL Take 1 tablet (5 mg total) by mouth 3 (three) times daily for 5 days. What changed: You were already taking a medication with the same name, and this prescription was added. Make sure you understand how and when to take each.   dicyclomine 10 MG capsule Commonly known as: BENTYL Take 1 capsule (10 mg total) by mouth 4 (four) times daily -  before meals and at bedtime for 30 days.   DULoxetine 60 MG capsule Commonly known as: CYMBALTA Take 60 mg by mouth daily.   etonogestrel-ethinyl estradiol 0.12-0.015 MG/24HR vaginal ring Commonly known as: NuvaRing USE AS DIRECTED BY DOCTOR   gabapentin 300 MG capsule Commonly known as: NEURONTIN Take 1 capsule (300 mg total) by mouth 2 (two) times daily.   HYDROcodone-acetaminophen 5-325 MG tablet Commonly known as: Norco Take 1 tablet by mouth every 4 (four) hours as needed for up to 3 days for moderate pain.   hydrOXYzine 25 MG capsule Commonly known as: VISTARIL TAKE 1-2 CAPSULES BY MOUTH EVERY 6 HOURS.   lisinopril-hydrochlorothiazide 20-25 MG tablet Commonly known as: ZESTORETIC TAKE (1) TABLET BY MOUTH EVERY  DAY   nystatin cream Commonly known as: MYCOSTATIN Apply 1 application topically 2 (two) times daily.   nystatin powder Commonly known as: nystatin Apply topically 4 (four) times daily.   ondansetron 4 MG tablet Commonly known as: ZOFRAN Take 1 tablet (4 mg total) by mouth every 8 (eight) hours as needed for nausea or vomiting.   pantoprazole 20 MG tablet Commonly known as:  PROTONIX Take 20 mg by mouth 2 (two) times daily.   pantoprazole 40 MG tablet Commonly known as: PROTONIX   promethazine 25 MG tablet Commonly known as: PHENERGAN TAKE (1) TABLET BY MOUTH EVERY 8 HOURS AS NEEDED FOR NAUSEA OR VOMITING   valACYclovir 1000 MG tablet Commonly known as: VALTREX Take 1 tablet (1,000 mg total) by mouth 2 (two) times daily as needed.      Follow-up Information    Herbert Pun, MD Follow up in 2 week(s).   Specialty: General Surgery Contact information: [address] [place] [address] [phone]

## 2018-11-09 LAB — HIV ANTIBODY (ROUTINE TESTING W REFLEX): HIV Screen 4th Generation wRfx: NONREACTIVE

## 2018-11-10 LAB — SURGICAL PATHOLOGY

## 2018-11-11 ENCOUNTER — Other Ambulatory Visit: Payer: Self-pay | Admitting: Internal Medicine

## 2018-11-11 MED ORDER — PROMETHAZINE HCL 25 MG PO TABS: 25.0000 mg | ORAL_TABLET | Freq: Every evening | ORAL | 0 refills | Status: DC | PRN

## 2018-11-11 MED ORDER — CEPHALEXIN 500 MG PO CAPS: 500.0000 mg | ORAL_CAPSULE | Freq: Two times a day (BID) | ORAL | 0 refills | Status: DC

## 2018-11-11 MED ORDER — GABAPENTIN 300 MG PO CAPS: 300.0000 mg | ORAL_CAPSULE | Freq: Two times a day (BID) | ORAL | 0 refills | Status: DC

## 2018-11-11 MED ORDER — ONDANSETRON HCL 4 MG PO TABS: 4.0000 mg | ORAL_TABLET | Freq: Two times a day (BID) | ORAL | 0 refills | Status: DC

## 2018-11-15 ENCOUNTER — Telehealth: Payer: Self-pay | Admitting: Gastroenterology

## 2018-11-15 NOTE — Telephone Encounter (Signed)
Pt left vm for Ginger she states she spoke with Dr. Allen Norris last Monday regarding stomach issues and she was told to call Monday and ask for Ginger regarding getting scheduled for a colonoscopy / endoscopy

## 2018-11-16 ENCOUNTER — Other Ambulatory Visit: Payer: Self-pay

## 2018-11-16 DIAGNOSIS — K529 Noninfective gastroenteritis and colitis, unspecified: Secondary | ICD-10-CM

## 2018-11-16 NOTE — Telephone Encounter (Signed)
Pt has been scheduled for a colonoscopy and EGD with Dr. Allen Norris at [facility] on 12/10/18. Instructions have been mailed to pt.

## 2018-11-16 NOTE — Telephone Encounter (Signed)
Left vm for pt to return my call to schedule Colonscopy and EGD.

## 2018-11-22 ENCOUNTER — Other Ambulatory Visit: Payer: Self-pay | Admitting: Gastroenterology

## 2018-11-22 ENCOUNTER — Other Ambulatory Visit: Payer: Self-pay | Admitting: Family Medicine

## 2018-11-22 DIAGNOSIS — K529 Noninfective gastroenteritis and colitis, unspecified: Secondary | ICD-10-CM

## 2018-11-23 ENCOUNTER — Telehealth: Payer: Self-pay | Admitting: Gastroenterology

## 2018-11-23 NOTE — Telephone Encounter (Signed)
Patient called & ask to speak to Ginger she has some questions. Please note she has an appointment with Dr Marius Ditch 11-24-18.

## 2018-11-23 NOTE — Telephone Encounter (Signed)
Spoke with pt regarding her appt with Dr. Marius Ditch that is scheduled for tomorrow, 11/24/18.

## 2018-11-24 ENCOUNTER — Encounter: Payer: Self-pay | Admitting: Gastroenterology

## 2018-11-24 ENCOUNTER — Ambulatory Visit (INDEPENDENT_AMBULATORY_CARE_PROVIDER_SITE_OTHER): Payer: PRIVATE HEALTH INSURANCE | Admitting: Gastroenterology

## 2018-11-24 ENCOUNTER — Other Ambulatory Visit: Payer: Self-pay

## 2018-11-24 VITALS — BP 115/84 | HR 112 | Temp 98.7°F | Resp 18 | Ht 72.0 in | Wt 217.2 lb

## 2018-11-24 DIAGNOSIS — R634 Abnormal weight loss: Secondary | ICD-10-CM

## 2018-11-24 DIAGNOSIS — K529 Noninfective gastroenteritis and colitis, unspecified: Secondary | ICD-10-CM

## 2018-11-24 MED ORDER — AMITRIPTYLINE HCL 50 MG PO TABS: 50.0000 mg | ORAL_TABLET | Freq: Every day | ORAL | 2 refills | Status: DC
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
## 2018-11-25 ENCOUNTER — Other Ambulatory Visit: Payer: Self-pay

## 2018-11-25 DIAGNOSIS — K529 Noninfective gastroenteritis and colitis, unspecified: Secondary | ICD-10-CM

## 2018-12-01 ENCOUNTER — Encounter: Payer: Self-pay | Admitting: Gastroenterology

## 2018-12-02 ENCOUNTER — Encounter: Payer: Self-pay | Admitting: Gastroenterology

## 2018-12-07 ENCOUNTER — Encounter: Payer: Self-pay | Admitting: Gastroenterology

## 2018-12-07 ENCOUNTER — Other Ambulatory Visit: Payer: Self-pay

## 2018-12-07 DIAGNOSIS — R197 Diarrhea, unspecified: Secondary | ICD-10-CM

## 2018-12-07 DIAGNOSIS — R1084 Generalized abdominal pain: Secondary | ICD-10-CM

## 2018-12-07 DIAGNOSIS — R634 Abnormal weight loss: Secondary | ICD-10-CM

## 2018-12-08 ENCOUNTER — Other Ambulatory Visit: Payer: Self-pay

## 2018-12-08 ENCOUNTER — Other Ambulatory Visit
Admission: RE | Admit: 2018-12-08 | Discharge: 2018-12-08 | Disposition: A | Discharge status: Home / Self Care | Payer: PRIVATE HEALTH INSURANCE | Source: Ambulatory Visit | Attending: Gastroenterology | Admitting: Gastroenterology

## 2018-12-08 ENCOUNTER — Ambulatory Visit: Admission: RE | Admit: 2018-12-08 | Payer: PRIVATE HEALTH INSURANCE | Source: Ambulatory Visit

## 2018-12-08 DIAGNOSIS — Z20828 Contact with and (suspected) exposure to other viral communicable diseases: Secondary | ICD-10-CM | POA: Diagnosis present

## 2018-12-08 DIAGNOSIS — Z01812 Encounter for preprocedural laboratory examination: Secondary | ICD-10-CM | POA: Insufficient documentation

## 2018-12-08 LAB — SARS CORONAVIRUS 2 (TAT 6-24 HRS): SARS Coronavirus 2: NEGATIVE

## 2018-12-08 MED ORDER — PROMETHAZINE HCL 25 MG PO TABS: 25.0000 mg | ORAL_TABLET | Freq: Every evening | ORAL | 0 refills | Status: DC | PRN

## 2018-12-09 ENCOUNTER — Encounter: Payer: Self-pay | Admitting: Gastroenterology

## 2018-12-09 ENCOUNTER — Ambulatory Visit: Admission: RE | Admit: 2018-12-09 | Payer: PRIVATE HEALTH INSURANCE | Source: Ambulatory Visit

## 2018-12-09 ENCOUNTER — Telehealth: Payer: Self-pay | Admitting: Gastroenterology

## 2018-12-09 NOTE — Telephone Encounter (Signed)
Patient called & would like to cancel her procedures on 12-13-18 because her insurance will not cover them. Please reschedule after 12-18-18 when she has new insurance ([employer]/Member# [id number] efft 12-18-18). Please contact patient with new appointments.

## 2018-12-10 ENCOUNTER — Other Ambulatory Visit: Payer: Self-pay | Admitting: Internal Medicine

## 2018-12-10 ENCOUNTER — Ambulatory Visit: Admit: 2018-12-10 | Payer: PRIVATE HEALTH INSURANCE | Admitting: Gastroenterology

## 2018-12-10 SURGERY — COLONOSCOPY WITH PROPOFOL
Anesthesia: Choice

## 2018-12-13 ENCOUNTER — Other Ambulatory Visit: Payer: Self-pay

## 2018-12-13 ENCOUNTER — Telehealth: Payer: Self-pay

## 2018-12-13 ENCOUNTER — Ambulatory Visit
Admission: RE | Admit: 2018-12-13 | Payer: PRIVATE HEALTH INSURANCE | Source: Ambulatory Visit | Admitting: Gastroenterology

## 2018-12-13 DIAGNOSIS — K529 Noninfective gastroenteritis and colitis, unspecified: Secondary | ICD-10-CM

## 2018-12-13 SURGERY — ESOPHAGOGASTRODUODENOSCOPY (EGD) WITH PROPOFOL
Anesthesia: General

## 2018-12-13 NOTE — Telephone Encounter (Signed)
LVM for pt to call office to reschedule her colonoscopy and EGD.  Colonoscopy/EGD Dx: K52.9 Chronic Diarrhea  Dr. Verlin Grills patient to be scheduled with her.  Thanks [employer]

## 2018-12-13 NOTE — Telephone Encounter (Signed)
Patients colonoscopy with EGD has been rescheduled to 12/23/18 with Dr. Marius Ditch at [facility].  Thanks [employer]

## 2018-12-20 ENCOUNTER — Other Ambulatory Visit: Payer: Self-pay

## 2018-12-20 ENCOUNTER — Other Ambulatory Visit
Admission: RE | Admit: 2018-12-20 | Discharge: 2018-12-20 | Disposition: A | Discharge status: Home / Self Care | Payer: PRIVATE HEALTH INSURANCE | Source: Ambulatory Visit | Attending: Gastroenterology | Admitting: Gastroenterology

## 2018-12-20 DIAGNOSIS — Z20828 Contact with and (suspected) exposure to other viral communicable diseases: Secondary | ICD-10-CM | POA: Insufficient documentation

## 2018-12-20 DIAGNOSIS — Z01812 Encounter for preprocedural laboratory examination: Secondary | ICD-10-CM | POA: Insufficient documentation

## 2018-12-20 DIAGNOSIS — K529 Noninfective gastroenteritis and colitis, unspecified: Secondary | ICD-10-CM | POA: Diagnosis not present

## 2018-12-20 LAB — SARS CORONAVIRUS 2 (TAT 6-24 HRS): SARS Coronavirus 2: NEGATIVE

## 2018-12-21 ENCOUNTER — Other Ambulatory Visit: Payer: Self-pay | Admitting: Gastroenterology

## 2018-12-21 DIAGNOSIS — K529 Noninfective gastroenteritis and colitis, unspecified: Secondary | ICD-10-CM

## 2018-12-21 NOTE — Telephone Encounter (Signed)
Procedures have been reschedule to 12/23/2018, pt has been notified

## 2018-12-22 ENCOUNTER — Encounter: Payer: Self-pay | Admitting: *Deleted

## 2018-12-23 ENCOUNTER — Ambulatory Visit
Admission: RE | Admit: 2018-12-23 | Discharge: 2018-12-23 | Disposition: A | Discharge status: Home / Self Care | Payer: PRIVATE HEALTH INSURANCE | Attending: Gastroenterology | Admitting: Gastroenterology

## 2018-12-23 ENCOUNTER — Encounter: Payer: Self-pay | Admitting: *Deleted

## 2018-12-23 ENCOUNTER — Encounter
Admission: RE | Disposition: A | Discharge status: Home / Self Care | Payer: Self-pay | Source: Home / Self Care | Attending: Gastroenterology

## 2018-12-23 ENCOUNTER — Ambulatory Visit: Payer: PRIVATE HEALTH INSURANCE | Admitting: Anesthesiology

## 2018-12-23 DIAGNOSIS — R634 Abnormal weight loss: Secondary | ICD-10-CM

## 2018-12-23 DIAGNOSIS — I10 Essential (primary) hypertension: Secondary | ICD-10-CM | POA: Diagnosis not present

## 2018-12-23 DIAGNOSIS — K529 Noninfective gastroenteritis and colitis, unspecified: Secondary | ICD-10-CM | POA: Diagnosis present

## 2018-12-23 DIAGNOSIS — Z6829 Body mass index (BMI) 29.0-29.9, adult: Secondary | ICD-10-CM | POA: Insufficient documentation

## 2018-12-23 DIAGNOSIS — F419 Anxiety disorder, unspecified: Secondary | ICD-10-CM | POA: Diagnosis not present

## 2018-12-23 DIAGNOSIS — Z79899 Other long term (current) drug therapy: Secondary | ICD-10-CM | POA: Diagnosis not present

## 2018-12-23 DIAGNOSIS — R197 Diarrhea, unspecified: Secondary | ICD-10-CM

## 2018-12-23 HISTORY — PX: ESOPHAGOGASTRODUODENOSCOPY (EGD) WITH PROPOFOL: SHX5813

## 2018-12-23 HISTORY — DX: Zoster without complications: B02.9

## 2018-12-23 HISTORY — DX: Essential (primary) hypertension: I10

## 2018-12-23 HISTORY — DX: Anxiety disorder, unspecified: F41.9

## 2018-12-23 HISTORY — PX: COLONOSCOPY WITH PROPOFOL: SHX5780

## 2018-12-23 LAB — POCT PREGNANCY, URINE: Preg Test, Ur: NEGATIVE

## 2018-12-23 SURGERY — COLONOSCOPY WITH PROPOFOL
Anesthesia: General

## 2018-12-23 MED ORDER — GLYCOPYRROLATE 0.2 MG/ML IJ SOLN
INTRAMUSCULAR | Status: DC | PRN
  Administered 2018-12-23: 0.2 mg via INTRAVENOUS

## 2018-12-23 MED ORDER — GLYCOPYRROLATE 0.2 MG/ML IJ SOLN
INTRAMUSCULAR | Status: AC
  Filled 2018-12-23: qty 1

## 2018-12-23 MED ORDER — PROPOFOL 10 MG/ML IV BOLUS
INTRAVENOUS | Status: DC | PRN
  Administered 2018-12-23: 20 mg via INTRAVENOUS
  Administered 2018-12-23: 100 mg via INTRAVENOUS
  Administered 2018-12-23: 20 mg via INTRAVENOUS

## 2018-12-23 MED ORDER — PROPOFOL 500 MG/50ML IV EMUL
INTRAVENOUS | Status: AC
  Filled 2018-12-23: qty 50

## 2018-12-23 MED ORDER — SODIUM CHLORIDE 0.9 % IV SOLN
INTRAVENOUS | Status: DC
  Administered 2018-12-23: 10:00:00 via INTRAVENOUS

## 2018-12-23 MED ORDER — LIDOCAINE HCL (CARDIAC) PF 100 MG/5ML IV SOSY
PREFILLED_SYRINGE | INTRAVENOUS | Status: DC | PRN
  Administered 2018-12-23: 40 mg via INTRAVENOUS

## 2018-12-23 MED ORDER — MIDAZOLAM HCL 2 MG/2ML IJ SOLN
INTRAMUSCULAR | Status: AC
  Filled 2018-12-23: qty 2

## 2018-12-23 MED ORDER — MIDAZOLAM HCL 2 MG/2ML IJ SOLN
INTRAMUSCULAR | Status: DC | PRN
  Administered 2018-12-23: 2 mg via INTRAVENOUS

## 2018-12-23 MED ORDER — PROPOFOL 500 MG/50ML IV EMUL
INTRAVENOUS | Status: DC | PRN
  Administered 2018-12-23: 150 ug/kg/min via INTRAVENOUS

## 2018-12-23 NOTE — H&P (Signed)
[name], MD [address]  [place]  [place],  [address]  Main: [phone]  Fax: [phone] Pager: [phone]  Primary Care Physician:  Glean Hess, MD Primary Gastroenterologist:  Dr. [name]  Pre-Procedure History & Physical: HPI:  Molly Cisneros is a 33 y.o. female is here for an endoscopy and colonoscopy.   Past Medical History:  Diagnosis Date  . Anxiety   . Hypertension   . Shingles    reoccuring shingles on face    Past Surgical History:  Procedure Laterality Date  . LAPAROSCOPIC APPENDECTOMY N/A 11/06/2018   Procedure: APPENDECTOMY LAPAROSCOPIC;  Surgeon: Herbert Pun, MD;  Location: [facility] ORS;  Service: General;  Laterality: N/A;    Prior to Admission medications   Medication Sig Start Date End Date Taking? Authorizing Provider  ALPRAZolam Duanne Moron) 1 MG tablet Take 1 mg by mouth at bedtime as needed for anxiety.   Yes [provider]  amitriptyline (ELAVIL) 50 MG tablet Take 1 tablet (50 mg total) by mouth at bedtime. 11/24/18 01/23/19 Yes Georgianne Gritz, Tally Due, MD  amphetamine-dextroamphetamine (ADDERALL XR) 30 MG 24 hr capsule  09/27/18  Yes [provider]  amphetamine-dextroamphetamine (ADDERALL) 20 MG tablet Take 20 mg by mouth 2 (two) times a day.  10/26/18  Yes [provider]  atorvastatin (LIPITOR) 20 MG tablet TAKE ONE TABLET BY MOUTH DAILY. 11/22/18  Yes Juline Patch, MD  dicyclomine (BENTYL) 10 MG capsule TAKE (1) CAPSULE BY MOUTH FOUR TIMES A DAY BEFORE MEALS AND AT BEDTIME. 12/21/18  Yes Raveena Hebdon, Tally Due, MD  DULoxetine (CYMBALTA) 60 MG capsule Take 60 mg by mouth daily.  07/18/15  Yes [provider]  etonogestrel-ethinyl estradiol (NUVARING) 0.12-0.015 MG/24HR vaginal ring USE AS DIRECTED BY DOCTOR 11/02/17  Yes Juline Patch, MD  gabapentin (NEURONTIN) 300 MG capsule TAKE (1) CAPSULE BY MOUTH TWICE DAILY 12/10/18  Yes Glean Hess, MD  lisinopril-hydrochlorothiazide (ZESTORETIC)  20-25 MG tablet TAKE (1) TABLET BY MOUTH EVERY DAY 11/03/18  Yes Glean Hess, MD  pantoprazole (PROTONIX) 40 MG tablet  10/07/18  Yes [provider]  valACYclovir (VALTREX) 1000 MG tablet Take 1 tablet (1,000 mg total) by mouth 2 (two) times daily as needed. 10/26/18  Yes Glean Hess, MD  butalbital-acetaminophen-caffeine (FIORICET, ESGIC) [phone] MG tablet Take 1 tablet by mouth daily as needed for headache. Patient not taking: Reported on 10/13/2018 08/31/18 08/31/19  Glean Hess, MD  cephALEXin (KEFLEX) 500 MG capsule Take 1 capsule (500 mg total) by mouth 2 (two) times daily. Patient not taking: Reported on 11/24/2018 11/11/18   Glean Hess, MD  cyclobenzaprine (FLEXERIL) 10 MG tablet  08/27/18   [provider]  hydrOXYzine (VISTARIL) 25 MG capsule TAKE 1-2 CAPSULES BY MOUTH EVERY 6 HOURS. 09/08/18   Juline Patch, MD  nystatin (NYSTATIN) powder Apply topically 4 (four) times daily. Patient not taking: Reported on 10/13/2018 09/06/18   Juline Patch, MD  nystatin cream (MYCOSTATIN) Apply 1 application topically 2 (two) times daily. Patient not taking: Reported on 10/13/2018 09/06/18   Juline Patch, MD  ondansetron (ZOFRAN) 4 MG tablet TAKE (1) TABLET BY MOUTH TWICE DAILY 12/10/18   Glean Hess, MD  pantoprazole (PROTONIX) 20 MG tablet Take 20 mg by mouth 2 (two) times daily.    [provider]  promethazine (PHENERGAN) 25 MG tablet Take 1 tablet (25 mg total) by mouth at bedtime as needed for nausea or vomiting. 12/08/18  Juline Patch, MD    Allergies as of 12/13/2018 - Review Complete 12/10/2018  Allergen Reaction Noted  . Shellfish allergy Anaphylaxis 05/30/2015  . Aloe vera Dermatitis 11/06/2018    Family History  Problem Relation Age of Onset  . Non-Hodgkin's lymphoma Father 60       Basil Cell  . Pancreatic cancer Maternal Grandmother 29  . Thyroid cancer Maternal Grandmother 60  . Throat cancer Paternal Grandfather 69     Social History   Socioeconomic History  . Marital status: Single    Spouse name: Not on file  . Number of children: Not on file  . Years of education: Not on file  . Highest education level: Not on file  Occupational History  . Not on file  Social Needs  . Financial resource strain: Not on file  . Food insecurity    Worry: Not on file    Inability: Not on file  . Transportation needs    Medical: Not on file    Non-medical: Not on file  Tobacco Use  . Smoking status: Never Smoker  . Smokeless tobacco: Never Used  Substance and Sexual Activity  . Alcohol use: Yes  . Drug use: No  . Sexual activity: Not Currently    Birth control/protection: Inserts  Lifestyle  . Physical activity    Days per week: Not on file    Minutes per session: Not on file  . Stress: Not on file  Relationships  . Social Herbalist on phone: Not on file    Gets together: Not on file    Attends religious service: Not on file    Active member of club or organization: Not on file    Attends meetings of clubs or organizations: Not on file    Relationship status: Not on file  . Intimate partner violence    Fear of current or ex partner: Not on file    Emotionally abused: Not on file    Physically abused: Not on file    Forced sexual activity: Not on file  Other Topics Concern  . Not on file  Social History Narrative  . Not on file    Review of Systems: See HPI, otherwise negative ROS  Physical Exam: BP (!) 120/93   Pulse (!) 117   Temp 97.8 F (36.6 C) (Tympanic)   Resp 18   Ht 6' (1.829 m)   Wt 97.1 kg   SpO2 97%   BMI 29.02 kg/m  General:   Alert,  pleasant and cooperative in NAD Head:  Normocephalic and atraumatic. Neck:  Supple; no masses or thyromegaly. Lungs:  Clear throughout to auscultation.    Heart:  Regular rate and rhythm. Abdomen:  Soft, nontender and nondistended. Normal bowel sounds, without guarding, and without rebound.   Neurologic:  Alert and  oriented  x4;  grossly normal neurologically.  Impression/Plan: Molly Cisneros is here for an endoscopy and colonoscopy to be performed for chronic diarrhea and weight loss  Risks, benefits, limitations, and alternatives regarding  endoscopy and colonoscopy have been reviewed with the patient.  Questions have been answered.  All parties agreeable.   Sherri Sear, MD  12/23/2018, 10:06 AM

## 2018-12-23 NOTE — Anesthesia Procedure Notes (Signed)
Date/Time: 12/23/2018 10:36 AM Performed by: Doreen Salvage, CRNA Pre-anesthesia Checklist: Patient identified, Emergency Drugs available, Suction available and Patient being monitored Patient Re-evaluated:Patient Re-evaluated prior to induction Oxygen Delivery Method: Nasal cannula Induction Type: IV induction Dental Injury: Teeth and Oropharynx as per pre-operative assessment  Comments: Nasal cannula with etCO2 monitoring

## 2018-12-23 NOTE — Anesthesia Post-op Follow-up Note (Signed)
Anesthesia QCDR form completed.        

## 2018-12-23 NOTE — Anesthesia Preprocedure Evaluation (Signed)
Anesthesia Evaluation  Patient identified by MRN, date of birth, ID band Patient awake    Reviewed: Allergy & Precautions, NPO status , Patient's Chart, lab work & pertinent test results  Airway Mallampati: III  TM Distance: <3 FB     Dental  (+) Teeth Intact   Pulmonary neg pulmonary ROS,    Pulmonary exam normal        Cardiovascular hypertension, Pt. on medications Normal cardiovascular exam     Neuro/Psych Anxiety negative neurological ROS     GI/Hepatic Neg liver ROS,   Endo/Other  negative endocrine ROS  Renal/GU negative Renal ROS  negative genitourinary   Musculoskeletal  (+) Arthritis , Osteoarthritis,    Abdominal Normal abdominal exam  (+)   Peds negative pediatric ROS (+)  Hematology negative hematology ROS (+)   Anesthesia Other Findings Past Medical History: No date: Anxiety No date: Hypertension No date: Shingles     Comment:  reoccuring shingles on face  Reproductive/Obstetrics                             Anesthesia Physical  Anesthesia Plan  ASA: II  Anesthesia Plan: General   Post-op Pain Management:    Induction: Intravenous  PONV Risk Score and Plan: Propofol infusion  Airway Management Planned: Nasal Cannula  Additional Equipment:   Intra-op Plan:   Post-operative Plan:   Informed Consent: I have reviewed the patients History and Physical, chart, labs and discussed the procedure including the risks, benefits and alternatives for the proposed anesthesia with the patient or authorized representative who has indicated his/her understanding and acceptance.     Dental advisory given  Plan Discussed with: CRNA and Surgeon  Anesthesia Plan Comments:         Anesthesia Quick Evaluation

## 2018-12-23 NOTE — Op Note (Signed)
[facility] Gastroenterology Patient Name: Molly Cisneros Procedure Date: 12/23/2018 10:31 AM MRN: [id number] Account #: [id number] Date of Birth: 1985-11-16 Admit Type: Outpatient Age: 33 Room: [facility] ENDO ROOM 2 Gender: Female Note Status: Finalized Procedure:            Colonoscopy Indications:          This is the patient's first colonoscopy, Chronic                        diarrhea, Clinically significant diarrhea of                        unexplained origin, Weight loss Providers:            Lin Landsman MD, MD Medicines:            Monitored Anesthesia Care Complications:        No immediate complications. Estimated blood loss: None. Procedure:            Pre-Anesthesia Assessment:                       - Prior to the procedure, a History and Physical was                        performed, and patient medications and allergies were                        reviewed. The patient is competent. The risks and                        benefits of the procedure and the sedation options and                        risks were discussed with the patient. All questions                        were answered and informed consent was obtained.                        Patient identification and proposed procedure were                        verified by the physician, the nurse, the                        anesthesiologist, the anesthetist and the technician in                        the pre-procedure area in the procedure room in the                        endoscopy suite. Mental Status Examination: alert and                        oriented. Airway Examination: normal oropharyngeal                        airway and neck mobility. Respiratory Examination:  clear to auscultation. CV Examination: normal.                        Prophylactic Antibiotics: The patient does not require                        prophylactic antibiotics. Prior Anticoagulants: The                patient has taken no previous anticoagulant or                        antiplatelet agents. ASA Grade Assessment: II - A                        patient with mild systemic disease. After reviewing the                        risks and benefits, the patient was deemed in                        satisfactory condition to undergo the procedure. The                        anesthesia plan was to use monitored anesthesia care                        (MAC). Immediately prior to administration of                        medications, the patient was re-assessed for adequacy                        to receive sedatives. The heart rate, respiratory rate,                        oxygen saturations, blood pressure, adequacy of                        pulmonary ventilation, and response to care were                        monitored throughout the procedure. The physical status                        of the patient was re-assessed after the procedure.                       After obtaining informed consent, the colonoscope was                        passed under direct vision. Throughout the procedure,                        the patient's blood pressure, pulse, and oxygen                        saturations were monitored continuously. The                        Colonoscope was introduced through the anus and  advanced to the 10 cm into the ileum. The colonoscopy                        was technically difficult and complex due to                        significant looping. Successful completion of the                        procedure was aided by applying abdominal pressure. The                        patient tolerated the procedure well. The quality of                        the bowel preparation was fair. Findings:      The perianal and digital rectal examinations were normal. Pertinent       negatives include normal sphincter tone and no palpable rectal lesions.      The  terminal ileum appeared normal.      Normal mucosa was found in the entire colon. Biopsies were taken with a       cold forceps for histology.      The retroflexed view of the distal rectum and anal verge was normal and       showed no anal or rectal abnormalities.      Copious quantities of liquid stool was found in the entire colon,       precluding visualization. Lavage of the area was performed using a       moderate amount of sterile water, resulting in clearance with good       visualization. Impression:           - Preparation of the colon was fair.                       - The examined portion of the ileum was normal.                       - Normal mucosa in the entire examined colon. Biopsied.                       - The distal rectum and anal verge are normal on                        retroflexion view.                       - Stool in the entire examined colon. Recommendation:       - Discharge patient to home (with escort).                       - Resume previous diet today.                       - Continue present medications.                       - Await pathology results.                       - Return to my office  as previously scheduled. Procedure Code(s):    --- Professional ---                       [phone], Colonoscopy, flexible; with biopsy, single or                        multiple Diagnosis Code(s):    --- Professional ---                       K52.9, Noninfective gastroenteritis and colitis,                        unspecified                       R19.7, Diarrhea, unspecified                       R63.4, Abnormal weight loss CPT copyright 2019 American Medical Association. All rights reserved. The codes documented in this report are preliminary and upon coder review may  be revised to meet current compliance requirements. Dr. Ulyess Mort Lin Landsman MD, MD 12/23/2018 11:03:51 AM This report has been signed electronically. Number of Addenda: 0 Note  Initiated On: 12/23/2018 10:31 AM Scope Withdrawal Time: 0 hours 9 minutes 9 seconds  Total Procedure Duration: 0 hours 13 minutes 50 seconds  Estimated Blood Loss: Estimated blood loss: none.      [facility]

## 2018-12-23 NOTE — Op Note (Signed)
[facility] Gastroenterology Patient Name: Molly Cisneros Procedure Date: 12/23/2018 10:32 AM MRN: [id number] Account #: [id number] Date of Birth: 10-Mar-1986 Admit Type: Outpatient Age: 33 Room: [facility] ENDO ROOM 2 Gender: Female Note Status: Finalized Procedure:            Upper GI endoscopy Indications:          Diarrhea, Weight loss Providers:            Lin Landsman MD, MD Referring MD:         Halina Maidens, MD (Referring MD) Medicines:            Monitored Anesthesia Care Complications:        No immediate complications. Estimated blood loss: None. Procedure:            Pre-Anesthesia Assessment:                       - Prior to the procedure, a History and Physical was                        performed, and patient medications and allergies were                        reviewed. The patient is competent. The risks and                        benefits of the procedure and the sedation options and                        risks were discussed with the patient. All questions                        were answered and informed consent was obtained.                        Patient identification and proposed procedure were                        verified by the physician, the nurse, the                        anesthesiologist, the anesthetist and the technician in                        the pre-procedure area in the procedure room in the                        endoscopy suite. Mental Status Examination: alert and                        oriented. Airway Examination: normal oropharyngeal                        airway and neck mobility. Respiratory Examination:                        clear to auscultation. CV Examination: normal.                        Prophylactic Antibiotics: The patient does not require  prophylactic antibiotics. Prior Anticoagulants: The                        patient has taken no previous anticoagulant or                         antiplatelet agents. ASA Grade Assessment: II - A                        patient with mild systemic disease. After reviewing the                        risks and benefits, the patient was deemed in                        satisfactory condition to undergo the procedure. The                        anesthesia plan was to use monitored anesthesia care                        (MAC). Immediately prior to administration of                        medications, the patient was re-assessed for adequacy                        to receive sedatives. The heart rate, respiratory rate,                        oxygen saturations, blood pressure, adequacy of                        pulmonary ventilation, and response to care were                        monitored throughout the procedure. The physical status                        of the patient was re-assessed after the procedure.                       After obtaining informed consent, the endoscope was                        passed under direct vision. Throughout the procedure,                        the patient's blood pressure, pulse, and oxygen                        saturations were monitored continuously. The Endoscope                        was introduced through the mouth, and advanced to the                        second part of duodenum. The upper GI endoscopy was  accomplished without difficulty. The patient tolerated                        the procedure well. Findings:      The duodenal bulb and second portion of the duodenum were normal.       Biopsies for histology were taken with a cold forceps for evaluation of       celiac disease.      A medium amount of food (residue) was found in the gastric body.       Retroflexion not performed due to food in stomach. Fundus visualised in       forward view only      The entire examined stomach was normal. Biopsies were taken with a cold       forceps for Helicobacter pylori  testing.      The gastroesophageal junction and examined esophagus were normal. Impression:           - Normal duodenal bulb and second portion of the                        duodenum. Biopsied.                       - A medium amount of food (residue) in the stomach.                       - Normal stomach. Biopsied.                       - Normal gastroesophageal junction and esophagus. Recommendation:       - Await pathology results.                       - Discuss about gastric emptying study                       - Proceed with colonoscopy as scheduled                       See colonoscopy report Procedure Code(s):    --- Professional ---                       [phone], Esophagogastroduodenoscopy, flexible, transoral;                        with biopsy, single or multiple Diagnosis Code(s):    --- Professional ---                       R19.7, Diarrhea, unspecified                       R63.4, Abnormal weight loss CPT copyright 2019 American Medical Association. All rights reserved. The codes documented in this report are preliminary and upon coder review may  be revised to meet current compliance requirements. Dr. Ulyess Mort Lin Landsman MD, MD 12/23/2018 10:46:19 AM This report has been signed electronically. Number of Addenda: 0 Note Initiated On: 12/23/2018 10:32 AM Estimated Blood Loss: Estimated blood loss: none.      [facility]

## 2018-12-23 NOTE — Transfer of Care (Signed)
Immediate Anesthesia Transfer of Care Note  Patient: Molly Cisneros  Procedure(s) Performed: Procedure(s): COLONOSCOPY WITH PROPOFOL (N/A) ESOPHAGOGASTRODUODENOSCOPY (EGD) WITH PROPOFOL (N/A)  Patient Location: PACU and Endoscopy Unit  Anesthesia Type:General  Level of Consciousness: sedated  Airway & Oxygen Therapy: Patient Spontanous Breathing and Patient connected to nasal cannula oxygen  Post-op Assessment: Report given to RN and Post -op Vital signs reviewed and stable  Post vital signs: Reviewed and stable  Last Vitals:  Vitals:   12/23/18 1100 12/23/18 1105  BP: 120/84 (!) 135/124  Pulse: (!) 127 (!) 124  Resp: 20 16  Temp: 36.6 C   SpO2: 41% 74%    Complications: No apparent anesthesia complications

## 2018-12-24 ENCOUNTER — Encounter: Payer: Self-pay | Admitting: Gastroenterology

## 2018-12-24 NOTE — Anesthesia Postprocedure Evaluation (Signed)
Anesthesia Post Note  Patient: Molly Cisneros  Procedure(s) Performed: COLONOSCOPY WITH PROPOFOL (N/A ) ESOPHAGOGASTRODUODENOSCOPY (EGD) WITH PROPOFOL (N/A )  Patient location during evaluation: Endoscopy Anesthesia Type: General Level of consciousness: awake and alert and oriented Pain management: pain level controlled Vital Signs Assessment: post-procedure vital signs reviewed and stable Respiratory status: spontaneous breathing Cardiovascular status: blood pressure returned to baseline Anesthetic complications: no     Last Vitals:  Vitals:   12/23/18 1120 12/23/18 1130  BP: (!) 113/99 126/83  Pulse: (!) 111 (!) 103  Resp: (!) 40 20  Temp:    SpO2: 98% 97%    Last Pain:  Vitals:   12/23/18 1100  TempSrc: Tympanic                 [name]

## 2018-12-27 ENCOUNTER — Encounter: Payer: Self-pay | Admitting: Gastroenterology

## 2018-12-27 LAB — SURGICAL PATHOLOGY

## 2019-01-03 ENCOUNTER — Other Ambulatory Visit: Payer: Self-pay | Admitting: Gastroenterology

## 2019-01-03 DIAGNOSIS — K529 Noninfective gastroenteritis and colitis, unspecified: Secondary | ICD-10-CM

## 2019-01-20 ENCOUNTER — Other Ambulatory Visit: Payer: Self-pay | Admitting: Internal Medicine

## 2019-02-05 ENCOUNTER — Emergency Department: Payer: PRIVATE HEALTH INSURANCE

## 2019-02-05 ENCOUNTER — Encounter: Payer: Self-pay | Admitting: Gynecology

## 2019-02-05 ENCOUNTER — Inpatient Hospital Stay
Admission: EM | Admit: 2019-02-05 | Discharge: 2019-02-09 | DRG: 872 | Disposition: A | Discharge status: Home / Self Care | Payer: PRIVATE HEALTH INSURANCE | Attending: Internal Medicine | Admitting: Internal Medicine

## 2019-02-05 ENCOUNTER — Ambulatory Visit (INDEPENDENT_AMBULATORY_CARE_PROVIDER_SITE_OTHER)
Admission: EM | Admit: 2019-02-05 | Discharge: 2019-02-05 | Disposition: A | Discharge status: Home / Self Care | Payer: PRIVATE HEALTH INSURANCE | Source: Home / Self Care | Attending: Urgent Care | Admitting: Urgent Care

## 2019-02-05 ENCOUNTER — Other Ambulatory Visit: Payer: Self-pay

## 2019-02-05 DIAGNOSIS — A0472 Enterocolitis due to Clostridium difficile, not specified as recurrent: Secondary | ICD-10-CM | POA: Diagnosis present

## 2019-02-05 DIAGNOSIS — R112 Nausea with vomiting, unspecified: Secondary | ICD-10-CM | POA: Diagnosis not present

## 2019-02-05 DIAGNOSIS — F419 Anxiety disorder, unspecified: Secondary | ICD-10-CM | POA: Diagnosis present

## 2019-02-05 DIAGNOSIS — Z23 Encounter for immunization: Secondary | ICD-10-CM | POA: Diagnosis not present

## 2019-02-05 DIAGNOSIS — N179 Acute kidney failure, unspecified: Secondary | ICD-10-CM | POA: Diagnosis present

## 2019-02-05 DIAGNOSIS — Z800 Family history of malignant neoplasm of digestive organs: Secondary | ICD-10-CM

## 2019-02-05 DIAGNOSIS — R197 Diarrhea, unspecified: Secondary | ICD-10-CM | POA: Diagnosis not present

## 2019-02-05 DIAGNOSIS — E876 Hypokalemia: Secondary | ICD-10-CM

## 2019-02-05 DIAGNOSIS — A419 Sepsis, unspecified organism: Principal | ICD-10-CM | POA: Diagnosis present

## 2019-02-05 DIAGNOSIS — D72829 Elevated white blood cell count, unspecified: Secondary | ICD-10-CM

## 2019-02-05 DIAGNOSIS — Z807 Family history of other malignant neoplasms of lymphoid, hematopoietic and related tissues: Secondary | ICD-10-CM | POA: Diagnosis not present

## 2019-02-05 DIAGNOSIS — K921 Melena: Secondary | ICD-10-CM | POA: Diagnosis not present

## 2019-02-05 DIAGNOSIS — Z808 Family history of malignant neoplasm of other organs or systems: Secondary | ICD-10-CM

## 2019-02-05 DIAGNOSIS — Z79899 Other long term (current) drug therapy: Secondary | ICD-10-CM | POA: Diagnosis not present

## 2019-02-05 DIAGNOSIS — Z91013 Allergy to seafood: Secondary | ICD-10-CM | POA: Diagnosis not present

## 2019-02-05 DIAGNOSIS — E871 Hypo-osmolality and hyponatremia: Secondary | ICD-10-CM | POA: Diagnosis present

## 2019-02-05 DIAGNOSIS — E782 Mixed hyperlipidemia: Secondary | ICD-10-CM | POA: Diagnosis present

## 2019-02-05 DIAGNOSIS — R6520 Severe sepsis without septic shock: Secondary | ICD-10-CM

## 2019-02-05 DIAGNOSIS — Z87892 Personal history of anaphylaxis: Secondary | ICD-10-CM

## 2019-02-05 DIAGNOSIS — Z91048 Other nonmedicinal substance allergy status: Secondary | ICD-10-CM | POA: Diagnosis not present

## 2019-02-05 DIAGNOSIS — F329 Major depressive disorder, single episode, unspecified: Secondary | ICD-10-CM | POA: Diagnosis present

## 2019-02-05 DIAGNOSIS — I951 Orthostatic hypotension: Secondary | ICD-10-CM

## 2019-02-05 DIAGNOSIS — Z87440 Personal history of urinary (tract) infections: Secondary | ICD-10-CM

## 2019-02-05 DIAGNOSIS — E860 Dehydration: Secondary | ICD-10-CM | POA: Diagnosis present

## 2019-02-05 DIAGNOSIS — Z20828 Contact with and (suspected) exposure to other viral communicable diseases: Secondary | ICD-10-CM | POA: Diagnosis present

## 2019-02-05 DIAGNOSIS — I10 Essential (primary) hypertension: Secondary | ICD-10-CM | POA: Diagnosis present

## 2019-02-05 HISTORY — DX: Hypokalemia: E87.6

## 2019-02-05 HISTORY — DX: Sepsis, unspecified organism: A41.9

## 2019-02-05 HISTORY — DX: Enterocolitis due to Clostridium difficile, not specified as recurrent: A04.72

## 2019-02-05 HISTORY — DX: Sepsis, unspecified organism: R65.20

## 2019-02-05 LAB — COMPREHENSIVE METABOLIC PANEL
ALT: 32 U/L (ref 0–44)
AST: 19 U/L (ref 15–41)
Albumin: 4.1 g/dL (ref 3.5–5.0)
Alkaline Phosphatase: 71 U/L (ref 38–126)
Anion gap: 14 (ref 5–15)
BUN: 13 mg/dL (ref 6–20)
CO2: 21 mmol/L — ABNORMAL LOW (ref 22–32)
Calcium: 9.3 mg/dL (ref 8.9–10.3)
Chloride: 94 mmol/L — ABNORMAL LOW (ref 98–111)
Creatinine, Ser: 1.92 mg/dL — ABNORMAL HIGH (ref 0.44–1.00)
GFR calc Af Amer: 39 mL/min — ABNORMAL LOW (ref >60)
GFR calc non Af Amer: 33 mL/min — ABNORMAL LOW (ref >60)
Glucose, Bld: 130 mg/dL — ABNORMAL HIGH (ref 70–99)
Potassium: 2.6 mmol/L — CL (ref 3.5–5.1)
Sodium: 129 mmol/L — ABNORMAL LOW (ref 135–145)
Total Bilirubin: 0.7 mg/dL (ref 0.3–1.2)
Total Protein: 7.5 g/dL (ref 6.5–8.1)

## 2019-02-05 LAB — CBC WITH DIFFERENTIAL/PLATELET
Abs Immature Granulocytes: 0.07 10*3/uL (ref 0.00–0.07)
Abs Immature Granulocytes: 0.1 10*3/uL — ABNORMAL HIGH (ref 0.00–0.07)
Basophils Absolute: 0.1 10*3/uL (ref 0.0–0.1)
Basophils Absolute: 0.1 10*3/uL (ref 0.0–0.1)
Basophils Relative: 0 %
Basophils Relative: 0 %
Eosinophils Absolute: 0 10*3/uL (ref 0.0–0.5)
Eosinophils Absolute: 0 10*3/uL (ref 0.0–0.5)
Eosinophils Relative: 0 %
Eosinophils Relative: 0 %
HCT: 39.1 % (ref 36.0–46.0)
HCT: 35.5 % — ABNORMAL LOW (ref 36.0–46.0)
Hemoglobin: 13.8 g/dL (ref 12.0–15.0)
Hemoglobin: 12.6 g/dL (ref 12.0–15.0)
Immature Granulocytes: 0 %
Immature Granulocytes: 1 %
Lymphocytes Relative: 17 %
Lymphocytes Relative: 17 %
Lymphs Abs: 2.9 10*3/uL (ref 0.7–4.0)
Lymphs Abs: 3 10*3/uL (ref 0.7–4.0)
MCH: 29.4 pg (ref 26.0–34.0)
MCH: 29.5 pg (ref 26.0–34.0)
MCHC: 35.3 g/dL (ref 30.0–36.0)
MCHC: 35.5 g/dL (ref 30.0–36.0)
MCV: 83.2 fL (ref 80.0–100.0)
MCV: 83.1 fL (ref 80.0–100.0)
Monocytes Absolute: 0.8 10*3/uL (ref 0.1–1.0)
Monocytes Absolute: 1.3 10*3/uL — ABNORMAL HIGH (ref 0.1–1.0)
Monocytes Relative: 5 %
Monocytes Relative: 7 %
Neutro Abs: 13.2 10*3/uL — ABNORMAL HIGH (ref 1.7–7.7)
Neutro Abs: 12.8 10*3/uL — ABNORMAL HIGH (ref 1.7–7.7)
Neutrophils Relative %: 78 %
Neutrophils Relative %: 75 %
Platelets: 523 10*3/uL — ABNORMAL HIGH (ref 150–400)
Platelets: 460 10*3/uL — ABNORMAL HIGH (ref 150–400)
RBC: 4.7 MIL/uL (ref 3.87–5.11)
RBC: 4.27 MIL/uL (ref 3.87–5.11)
RDW: 12.8 % (ref 11.5–15.5)
RDW: 12.7 % (ref 11.5–15.5)
WBC: 17.1 10*3/uL — ABNORMAL HIGH (ref 4.0–10.5)
WBC: 17.2 10*3/uL — ABNORMAL HIGH (ref 4.0–10.5)
nRBC: 0 % (ref 0.0–0.2)
nRBC: 0 % (ref 0.0–0.2)

## 2019-02-05 LAB — POCT PREGNANCY, URINE: Preg Test, Ur: NEGATIVE

## 2019-02-05 LAB — C DIFFICILE QUICK SCREEN W PCR REFLEX
C Diff antigen: POSITIVE — AB
C Diff toxin: NEGATIVE

## 2019-02-05 LAB — BASIC METABOLIC PANEL
Anion gap: 10 (ref 5–15)
BUN: 13 mg/dL (ref 6–20)
CO2: 20 mmol/L — ABNORMAL LOW (ref 22–32)
Calcium: 8.4 mg/dL — ABNORMAL LOW (ref 8.9–10.3)
Chloride: 98 mmol/L (ref 98–111)
Creatinine, Ser: 1.65 mg/dL — ABNORMAL HIGH (ref 0.44–1.00)
GFR calc Af Amer: 46 mL/min — ABNORMAL LOW (ref >60)
GFR calc non Af Amer: 40 mL/min — ABNORMAL LOW (ref >60)
Glucose, Bld: 109 mg/dL — ABNORMAL HIGH (ref 70–99)
Potassium: 2.6 mmol/L — CL (ref 3.5–5.1)
Sodium: 128 mmol/L — ABNORMAL LOW (ref 135–145)

## 2019-02-05 LAB — HEPATIC FUNCTION PANEL
ALT: 28 U/L (ref 0–44)
AST: 17 U/L (ref 15–41)
Albumin: 3.6 g/dL (ref 3.5–5.0)
Alkaline Phosphatase: 61 U/L (ref 38–126)
Bilirubin, Direct: <0.1 mg/dL (ref 0.0–0.2)
Total Bilirubin: 0.5 mg/dL (ref 0.3–1.2)
Total Protein: 6.6 g/dL (ref 6.5–8.1)

## 2019-02-05 LAB — LACTIC ACID, PLASMA
Lactic Acid, Venous: 2.2 mmol/L (ref 0.5–1.9)
Lactic Acid, Venous: 2 mmol/L (ref 0.5–1.9)

## 2019-02-05 LAB — HEMOGLOBIN AND HEMATOCRIT, BLOOD
HCT: 34.3 % — ABNORMAL LOW (ref 36.0–46.0)
Hemoglobin: 11.9 g/dL — ABNORMAL LOW (ref 12.0–15.0)

## 2019-02-05 LAB — URINALYSIS, COMPLETE (UACMP) WITH MICROSCOPIC
Bacteria, UA: NONE SEEN
Bilirubin Urine: NEGATIVE
Glucose, UA: NEGATIVE mg/dL
Hgb urine dipstick: NEGATIVE
Ketones, ur: NEGATIVE mg/dL
Leukocytes,Ua: NEGATIVE
Nitrite: NEGATIVE
Protein, ur: NEGATIVE mg/dL
Specific Gravity, Urine: 1.004 — ABNORMAL LOW (ref 1.005–1.030)
pH: 5 (ref 5.0–8.0)

## 2019-02-05 LAB — CLOSTRIDIUM DIFFICILE BY PCR, REFLEXED: Toxigenic C. Difficile by PCR: POSITIVE — AB

## 2019-02-05 LAB — MAGNESIUM: Magnesium: 1.6 mg/dL — ABNORMAL LOW (ref 1.7–2.4)

## 2019-02-05 LAB — LIPASE, BLOOD: Lipase: 23 U/L (ref 11–51)

## 2019-02-05 MED ORDER — VANCOMYCIN 50 MG/ML ORAL SOLUTION
125.0000 mg | Freq: Four times a day (QID) | ORAL | Status: DC
  Administered 2019-02-05 – 2019-02-09 (×16): 125 mg via ORAL
  Filled 2019-02-05 (×20): qty 2.5

## 2019-02-05 MED ORDER — LACTATED RINGERS IV BOLUS
1000.0000 mL | Freq: Once | INTRAVENOUS | Status: AC
  Administered 2019-02-05: 1000 mL via INTRAVENOUS

## 2019-02-05 MED ORDER — POTASSIUM CHLORIDE CRYS ER 20 MEQ PO TBCR
40.0000 meq | EXTENDED_RELEASE_TABLET | Freq: Once | ORAL | Status: AC
  Administered 2019-02-05: 40 meq via ORAL
  Filled 2019-02-05: qty 2

## 2019-02-05 MED ORDER — ALPRAZOLAM 0.5 MG PO TABS: 1.0000 mg | ORAL_TABLET | Freq: Every evening | ORAL | Status: DC | PRN

## 2019-02-05 MED ORDER — PANTOPRAZOLE SODIUM 40 MG PO TBEC
40.0000 mg | DELAYED_RELEASE_TABLET | Freq: Two times a day (BID) | ORAL | Status: DC
  Administered 2019-02-05 – 2019-02-09 (×8): 40 mg via ORAL
  Filled 2019-02-05 (×8): qty 1

## 2019-02-05 MED ORDER — SODIUM CHLORIDE 0.9 % IV SOLN
Freq: Once | INTRAVENOUS | Status: AC
  Administered 2019-02-05: 16:00:00 via INTRAVENOUS

## 2019-02-05 MED ORDER — ONDANSETRON HCL 4 MG PO TABS: 4.0000 mg | ORAL_TABLET | Freq: Four times a day (QID) | ORAL | Status: DC | PRN

## 2019-02-05 MED ORDER — MORPHINE SULFATE (PF) 4 MG/ML IV SOLN
4.0000 mg | Freq: Once | INTRAVENOUS | Status: AC
  Administered 2019-02-05: 17:00:00 4 mg via INTRAVENOUS
  Filled 2019-02-05: qty 1

## 2019-02-05 MED ORDER — OXYCODONE HCL 5 MG PO TABS
5.0000 mg | ORAL_TABLET | ORAL | Status: DC | PRN
  Administered 2019-02-06 – 2019-02-09 (×6): 5 mg via ORAL
  Filled 2019-02-05 (×6): qty 1

## 2019-02-05 MED ORDER — INFLUENZA VAC A&B SA ADJ QUAD 0.5 ML IM PRSY
0.5000 mL | PREFILLED_SYRINGE | INTRAMUSCULAR | Status: AC
  Administered 2019-02-07: 0.5 mL via INTRAMUSCULAR
  Filled 2019-02-05: qty 0.5

## 2019-02-05 MED ORDER — POTASSIUM CHLORIDE 10 MEQ/100ML IV SOLN
10.0000 meq | INTRAVENOUS | Status: AC
  Administered 2019-02-05 – 2019-02-06 (×5): 10 meq via INTRAVENOUS
  Filled 2019-02-05 (×5): qty 100

## 2019-02-05 MED ORDER — SODIUM CHLORIDE 0.9 % IV BOLUS
500.0000 mL | Freq: Once | INTRAVENOUS | Status: AC
  Administered 2019-02-05: 500 mL via INTRAVENOUS

## 2019-02-05 MED ORDER — HYDROMORPHONE HCL 1 MG/ML IJ SOLN
0.5000 mg | Freq: Once | INTRAMUSCULAR | Status: AC
  Administered 2019-02-05: 20:00:00 0.5 mg via INTRAVENOUS
  Filled 2019-02-05: qty 1

## 2019-02-05 MED ORDER — ACETAMINOPHEN 650 MG RE SUPP: 650.0000 mg | Freq: Four times a day (QID) | RECTAL | Status: DC | PRN

## 2019-02-05 MED ORDER — LACTATED RINGERS IV BOLUS
1000.0000 mL | Freq: Once | INTRAVENOUS | Status: AC
  Administered 2019-02-05: 17:00:00 1000 mL via INTRAVENOUS

## 2019-02-05 MED ORDER — ONDANSETRON HCL 4 MG/2ML IJ SOLN
4.0000 mg | Freq: Four times a day (QID) | INTRAMUSCULAR | Status: DC | PRN
  Administered 2019-02-05 – 2019-02-09 (×5): 4 mg via INTRAVENOUS
  Filled 2019-02-05 (×5): qty 2

## 2019-02-05 MED ORDER — HYDROMORPHONE HCL 1 MG/ML IJ SOLN
0.5000 mg | INTRAMUSCULAR | Status: DC | PRN
  Administered 2019-02-05 – 2019-02-09 (×11): 0.5 mg via INTRAVENOUS
  Filled 2019-02-05 (×11): qty 0.5

## 2019-02-05 MED ORDER — ATORVASTATIN CALCIUM 20 MG PO TABS
20.0000 mg | ORAL_TABLET | Freq: Every day | ORAL | Status: DC
  Administered 2019-02-06 – 2019-02-08 (×3): 20 mg via ORAL
  Filled 2019-02-05 (×3): qty 1

## 2019-02-05 MED ORDER — DULOXETINE HCL 30 MG PO CPEP
60.0000 mg | ORAL_CAPSULE | Freq: Every day | ORAL | Status: DC
  Administered 2019-02-06 – 2019-02-09 (×4): 60 mg via ORAL
  Filled 2019-02-05 (×4): qty 2

## 2019-02-05 MED ORDER — ONDANSETRON HCL 4 MG/2ML IJ SOLN
4.0000 mg | Freq: Once | INTRAMUSCULAR | Status: AC
  Administered 2019-02-05: 17:00:00 4 mg via INTRAVENOUS
  Filled 2019-02-05: qty 2

## 2019-02-05 MED ORDER — ENOXAPARIN SODIUM 40 MG/0.4ML ~~LOC~~ SOLN
40.0000 mg | Freq: Every day | SUBCUTANEOUS | Status: DC
  Administered 2019-02-05 – 2019-02-08 (×4): 40 mg via SUBCUTANEOUS
  Filled 2019-02-05 (×4): qty 0.4

## 2019-02-05 MED ORDER — SODIUM CHLORIDE 0.9 % IV SOLN
INTRAVENOUS | Status: DC
  Administered 2019-02-05 – 2019-02-07 (×3): via INTRAVENOUS

## 2019-02-05 MED ORDER — MAGNESIUM SULFATE 2 GM/50ML IV SOLN
2.0000 g | Freq: Once | INTRAVENOUS | Status: AC
  Administered 2019-02-06: 2 g via INTRAVENOUS
  Filled 2019-02-05: qty 50

## 2019-02-05 MED ORDER — MAGNESIUM SULFATE 2 GM/50ML IV SOLN
2.0000 g | Freq: Once | INTRAVENOUS | Status: AC
  Administered 2019-02-05: 18:00:00 2 g via INTRAVENOUS
  Filled 2019-02-05: qty 50

## 2019-02-05 MED ORDER — ACETAMINOPHEN 325 MG PO TABS: 650.0000 mg | ORAL_TABLET | Freq: Four times a day (QID) | ORAL | Status: DC | PRN

## 2019-02-05 MED ORDER — HYDROXYZINE HCL 25 MG PO TABS: 25.0000 mg | ORAL_TABLET | Freq: Every evening | ORAL | Status: DC | PRN

## 2019-02-05 MED ORDER — ONDANSETRON HCL 4 MG/2ML IJ SOLN
4.0000 mg | Freq: Once | INTRAMUSCULAR | Status: AC
  Administered 2019-02-05: 16:00:00 4 mg via INTRAVENOUS

## 2019-02-05 MED ORDER — POTASSIUM CHLORIDE 20 MEQ/15ML (10%) PO SOLN: 40.0000 meq | Freq: Once | ORAL | Status: DC

## 2019-02-05 MED ORDER — IOHEXOL 300 MG/ML  SOLN
100.0000 mL | Freq: Once | INTRAMUSCULAR | Status: AC | PRN
  Administered 2019-02-05: 19:00:00 100 mL via INTRAVENOUS

## 2019-02-05 NOTE — H&P (Signed)
[facility] NAME: Molly Cisneros    MR#:  [id number]  DATE OF BIRTH:  1985/07/13  DATE OF ADMISSION:  02/05/2019  PRIMARY CARE PHYSICIAN: Glean Hess, MD   REQUESTING/REFERRING PHYSICIAN: Charna Archer, MD  CHIEF COMPLAINT:   Chief Complaint  Patient presents with  . Abdominal Pain    HISTORY OF PRESENT ILLNESS:  Molly Cisneros  is a 33 y.o. female who presents with chief complaint as above.  Patient presents the ED with a complaint of abdominal pain with worsening diarrhea.  She states that this has been progressive for the past week or so.  She does have a history of chronic diarrhea, and has been following in the outpatient setting with GI for work-up.  On evaluation here in the ED tonight she does meet sepsis criteria with some tachycardia, leukocytosis, lactic acidosis, and hypotension at presentation.  Her blood pressure did respond to IV fluids.  She was found to be C. difficile positive.  CT scan shows no significant complications.  Hospitalist called for admission  PAST MEDICAL HISTORY:   Past Medical History:  Diagnosis Date  . Anxiety   . Hypertension   . Shingles    reoccuring shingles on face     PAST SURGICAL HISTORY:   Past Surgical History:  Procedure Laterality Date  . COLONOSCOPY WITH PROPOFOL N/A 12/23/2018   Procedure: COLONOSCOPY WITH PROPOFOL;  Surgeon: Lin Landsman, MD;  Location: [facility] ENDOSCOPY;  Service: Gastroenterology;  Laterality: N/A;  . ESOPHAGOGASTRODUODENOSCOPY (EGD) WITH PROPOFOL N/A 12/23/2018   Procedure: ESOPHAGOGASTRODUODENOSCOPY (EGD) WITH PROPOFOL;  Surgeon: Lin Landsman, MD;  Location: [facility] ENDOSCOPY;  Service: Gastroenterology;  Laterality: N/A;  . LAPAROSCOPIC APPENDECTOMY N/A 11/06/2018   Procedure: APPENDECTOMY LAPAROSCOPIC;  Surgeon: Herbert Pun, MD;  Location: [facility] ORS;  Service: General;  Laterality: N/A;     SOCIAL HISTORY:   Social History   Tobacco  Use  . Smoking status: Never Smoker  . Smokeless tobacco: Never Used  Substance Use Topics  . Alcohol use: Yes     FAMILY HISTORY:   Family History  Problem Relation Age of Onset  . Non-Hodgkin's lymphoma Father 71       Basil Cell  . Pancreatic cancer Maternal Grandmother 67  . Thyroid cancer Maternal Grandmother 38  . Throat cancer Paternal Grandfather 73     DRUG ALLERGIES:   Allergies  Allergen Reactions  . Shellfish Allergy Anaphylaxis  . Aloe Vera Dermatitis    MEDICATIONS AT HOME:   Prior to Admission medications   Medication Sig Start Date End Date Taking? Authorizing Provider  ALPRAZolam Duanne Moron) 1 MG tablet Take 1 mg by mouth at bedtime as needed for anxiety.    [provider]  amitriptyline (ELAVIL) 50 MG tablet Take 1 tablet (50 mg total) by mouth at bedtime. 11/24/18 01/23/19  Lin Landsman, MD  amphetamine-dextroamphetamine (ADDERALL XR) 30 MG 24 hr capsule  09/27/18   [provider]  amphetamine-dextroamphetamine (ADDERALL) 20 MG tablet Take 20 mg by mouth 2 (two) times a day.  10/26/18   [provider]  atorvastatin (LIPITOR) 20 MG tablet TAKE ONE TABLET BY MOUTH DAILY. 11/22/18   Juline Patch, MD  cyclobenzaprine (FLEXERIL) 10 MG tablet  08/27/18   [provider]  dicyclomine (BENTYL) 10 MG capsule TAKE (1) CAPSULE BY MOUTH FOUR TIMES A DAY BEFORE MEALS AND AT BEDTIME. 12/21/18   Vanga, Tally Due, MD  DULoxetine (CYMBALTA) 60 MG  capsule Take 60 mg by mouth daily.  07/18/15   [provider]  etonogestrel-ethinyl estradiol (NUVARING) 0.12-0.015 MG/24HR vaginal ring USE AS DIRECTED BY DOCTOR 11/02/17   Juline Patch, MD  gabapentin (NEURONTIN) 300 MG capsule TAKE (1) CAPSULE BY MOUTH TWICE DAILY 01/20/19   Glean Hess, MD  hydrOXYzine (VISTARIL) 25 MG capsule TAKE 1-2 CAPSULES BY MOUTH EVERY 6 HOURS. 01/20/19   Glean Hess, MD  lisinopril-hydrochlorothiazide (ZESTORETIC) 20-25 MG tablet TAKE (1) TABLET BY  MOUTH EVERY DAY 11/03/18   Glean Hess, MD  ondansetron (ZOFRAN) 4 MG tablet TAKE (1) TABLET BY MOUTH TWICE DAILY 12/10/18   Glean Hess, MD  pantoprazole (PROTONIX) 20 MG tablet Take 20 mg by mouth 2 (two) times daily.    [provider]  pantoprazole (PROTONIX) 40 MG tablet  10/07/18   [provider]  promethazine (PHENERGAN) 25 MG tablet Take 1 tablet (25 mg total) by mouth at bedtime as needed for nausea or vomiting. 12/08/18   Juline Patch, MD  valACYclovir (VALTREX) 1000 MG tablet Take 1 tablet (1,000 mg total) by mouth 2 (two) times daily as needed. 10/26/18   Glean Hess, MD    REVIEW OF SYSTEMS:  Review of Systems  Constitutional: Negative for chills, fever, malaise/fatigue and weight loss.  HENT: Negative for ear pain, hearing loss and tinnitus.   Eyes: Negative for blurred vision, double vision, pain and redness.  Respiratory: Negative for cough, hemoptysis and shortness of breath.   Cardiovascular: Negative for chest pain, palpitations, orthopnea and leg swelling.  Gastrointestinal: Positive for abdominal pain, blood in stool (Intermittent), diarrhea, nausea and vomiting. Negative for constipation.  Genitourinary: Negative for dysuria, frequency and hematuria.  Musculoskeletal: Negative for back pain, joint pain and neck pain.  Skin:       No acne, rash, or lesions  Neurological: Negative for dizziness, tremors, focal weakness and weakness.  Endo/Heme/Allergies: Negative for polydipsia. Does not bruise/bleed easily.  Psychiatric/Behavioral: Negative for depression. The patient is not nervous/anxious and does not have insomnia.      VITAL SIGNS:   Vitals:   02/05/19 1830 02/05/19 1930 02/05/19 2000 02/05/19 2030  BP: 104/73 96/61 103/61 (!) 103/59  Pulse: 96 85 94 96  Resp: 17 13 16 14   Temp:      TempSrc:      SpO2: 99% 100% 100% 99%  Weight:      Height:       Wt Readings from Last 3 Encounters:  02/05/19 97.1 kg  02/05/19 97.1 kg   12/23/18 97.1 kg    PHYSICAL EXAMINATION:  Physical Exam  Vitals reviewed. Constitutional: She is oriented to person, place, and time. She appears well-developed and well-nourished. No distress.  HENT:  Head: Normocephalic and atraumatic.  Dry mucous membranes  Eyes: Pupils are equal, round, and reactive to light. Conjunctivae and EOM are normal. No scleral icterus.  Neck: Normal range of motion. Neck supple. No JVD present. No thyromegaly present.  Cardiovascular: Regular rhythm and intact distal pulses. Exam reveals no gallop and no friction rub.  No murmur heard. Tachycardic  Respiratory: Effort normal and breath sounds normal. No respiratory distress. She has no wheezes. She has no rales.  GI: Soft. She exhibits no distension. There is abdominal tenderness.  Musculoskeletal: Normal range of motion.        General: No edema.     Comments: No arthritis, no gout  Lymphadenopathy:    She has no cervical adenopathy.  Neurological: She is alert and oriented to person, place, and time. No cranial nerve deficit.  No dysarthria, no aphasia  Skin: Skin is warm and dry. No rash noted. No erythema.  Psychiatric: She has a normal mood and affect. Her behavior is normal. Judgment and thought content normal.    LABORATORY PANEL:   CBC Recent Labs  Lab 02/05/19 1658  WBC 17.2*  HGB 12.6  HCT 35.5*  PLT 460*   ------------------------------------------------------------------------------------------------------------------  Chemistries  Recent Labs  Lab 02/05/19 1658  NA 128*  K 2.6*  CL 98  CO2 20*  GLUCOSE 109*  BUN 13  CREATININE 1.65*  CALCIUM 8.4*  MG 1.6*  AST 17  ALT 28  ALKPHOS 61  BILITOT 0.5   ------------------------------------------------------------------------------------------------------------------  Cardiac Enzymes No results for input(s): TROPONINI in the last 168  hours. ------------------------------------------------------------------------------------------------------------------  RADIOLOGY:  Ct Abdomen Pelvis W Contrast  Result Date: 02/05/2019 CLINICAL DATA:  Abdominal pain, nausea, diarrhea, appendectomy 2 months ago EXAM: CT ABDOMEN AND PELVIS WITH CONTRAST TECHNIQUE: Multidetector CT imaging of the abdomen and pelvis was performed using the standard protocol following bolus administration of intravenous contrast. CONTRAST:  112mL OMNIPAQUE IOHEXOL 300 MG/ML  SOLN COMPARISON:  11/06/2018 FINDINGS: Lower chest: No acute abnormality. Hepatobiliary: No solid liver abnormality is seen. No gallstones, gallbladder wall thickening, or biliary dilatation. Pancreas: Unremarkable. No pancreatic ductal dilatation or surrounding inflammatory changes. Spleen: Normal in size without significant abnormality. Adrenals/Urinary Tract: Adrenal glands are unremarkable. Kidneys are normal, without renal calculi, solid lesion, or hydronephrosis. Bladder is unremarkable. Stomach/Bowel: Stomach is within normal limits. Status post appendectomy. No evidence of bowel wall thickening, distention, or inflammatory changes. The colon is fluid-filled to the rectum. Vascular/Lymphatic: No significant vascular findings are present. No enlarged abdominal or pelvic lymph nodes. Reproductive: No mass or other significant abnormality. Multiple phleboliths in the pelvis. Other: No abdominal wall hernia or abnormality. No abdominopelvic ascites. Musculoskeletal: No acute or significant osseous findings. IMPRESSION: 1. The colon is fluid-filled to the rectum, in keeping with diarrheal illness. There are no inflammatory findings of the colon. 2. Status post appendectomy without evidence of postoperative complication. Electronically Signed   By: Eddie Candle M.D.   On: 02/05/2019 19:00   Dg Chest Portable 1 View  Result Date: 02/05/2019 CLINICAL DATA:  Chest pain EXAM: PORTABLE CHEST 1 VIEW  COMPARISON:  None. FINDINGS: The heart size and mediastinal contours are within normal limits. Both lungs are clear. The visualized skeletal structures are unremarkable. IMPRESSION: No acute cardiopulmonary process. Electronically Signed   By: Prudencio Pair M.D.   On: 02/05/2019 18:27    EKG:   Orders placed or performed during the hospital encounter of 02/05/19  . ED EKG  . ED EKG    IMPRESSION AND PLAN:  Principal Problem:   Severe sepsis ([place]) -due to C. difficile, lactic acid initially elevated, patient is on IV fluids, will recheck lactic acid until within normal limits, p.o. Vanco given, continue this inpatient, cultures sent, blood pressure was initially low but has improved significantly with IV fluids Active Problems:   C. difficile diarrhea -p.o. Vanco as above, IV fluids, GI consult   Hypokalemia -replace and monitor   Hypomagnesemia -replace and monitor   Essential hypertension -hold antihypertensives for now as the patient's blood pressure was initially low   Hyperlipidemia, mixed -home dose antilipid  Chart review performed and case discussed with ED provider. Labs, imaging and/or ECG reviewed by provider and discussed with patient/family. Management plans discussed with the patient  and/or family.  COVID-19 status: Pending  DVT PROPHYLAXIS: SubQ lovenox   GI PROPHYLAXIS:  PPI   ADMISSION STATUS: Inpatient     CODE STATUS: Full Code Status History    Date Active Date Inactive Code Status Order ID Comments User Context   11/06/2018 1742 11/06/2018 2253 Full Code [id number]  Herbert Pun, MD ED   Advance Care Planning Activity      TOTAL TIME TAKING CARE OF THIS PATIENT: 45 minutes.   This patient was evaluated in the context of the global COVID-19 pandemic, which necessitated consideration that the patient might be at risk for infection with the SARS-CoV-2 virus that causes COVID-19. Institutional protocols and algorithms that pertain to the evaluation of  patients at risk for COVID-19 are in a state of rapid change based on information released by regulatory bodies including the CDC and federal and state organizations. These policies and algorithms were followed to the best of this provider's knowledge to date during the patient's care at this facility.  Ethlyn Daniels 02/05/2019, 9:01 PM  [facility]  Office  [phone]  CC: Primary care physician; Glean Hess, MD  Note:  This document was prepared using Dragon voice recognition software and may include unintentional dictation errors.

## 2019-02-05 NOTE — ED Notes (Signed)
Pt given urine cup for collection when able  

## 2019-02-05 NOTE — ED Triage Notes (Signed)
Patient c/o GI problem x 6 months. Per patient notice blood in stool x yesterday. Per patient with diarrhea and abdominal pain.

## 2019-02-05 NOTE — ED Provider Notes (Signed)
[place], [place]   Name: Molly Cisneros DOB: 11-15-1985 MRN: [id number] CSN: [id number] PCP: Glean Hess, MD  Arrival date and time:  02/05/19 1509  Chief Complaint:  GI Problem   NOTE: Prior to seeing the patient today, I have reviewed the triage nursing documentation and vital signs. Clinical staff has updated patient's PMH/PSHx, current medication list, and drug allergies/intolerances to ensure comprehensive history available to assist in medical decision making.   History:   HPI: Molly Cisneros is a 33 y.o. female who presents today with complaints of abdominal pain and feelings of being pre-syncopal. Patient reports a 6 month history of diarrhea with intermittent episodes of both melena and hematochezia. She states, "since April I have about 15 diarrhea stools a day". She reports significant weight loss from 250 pounds to her current weight of 214 pounds. Patient is followed by gastroenterology Marius Ditch, MD). She had an EGD and colonoscopy on 12/23/2018 that were bother negative. Differential diagnosis at that time was reported to be "stress induced IBS". Patient was prescribed a prolonged steroid course, however she notes that it did not really help her. She was prescribed Elavil for fecal incontinence episodes, which improved this symptom until this week. Patient reports that she has soiled her bed twice this week. Sees GI again next week to discuss having further endoscopic evaluation via capsule (VCE). Of note, patient underwent a laparoscopic appendectomy Windell Moment, MD) in 10/2018.  Today, patient complains of mid-abdominal pain with waves of nausea and vomiting. Symptoms have been going on since 08/2018, however patient notes that they have worsened over the course of the last week or so. Appetite has been decreased overall. She reports that she has maintained a diet that mainly has consisted of  "potato soup, broth, and popsicles" for the last several months. She has been making efforts  to consume more water as Gatorade to remain hydrated and prevent electrolyte derangements. Patient reports that today she felt like a "black curtain" was coming down over her eyes when she changed positions. Patient is unsteady on her feet and reports feeling "really lightheaded". Her presenting blood pressure was 67/52 and a HR, seen by myself, as high as 132 bpm. Orthostatic VS obtained by clinic staff (see VSFS) and found to be positive. Patient presents with a parent who is a family practice physician Otilio Miu, MD). Dr. Ronnald Ramp tested patient's stool at home and she was guaiac positive. Testing card brought into clinic today for provider review. Dr. Ronnald Ramp with concerns that patient may have have lost a significant amount of blood and may potentially require a blood transfusion. She is requesting labs to be checked today.     Past Medical History:  Diagnosis Date  . Anxiety   . Hypertension   . Shingles    reoccuring shingles on face    Past Surgical History:  Procedure Laterality Date  . COLONOSCOPY WITH PROPOFOL N/A 12/23/2018   Procedure: COLONOSCOPY WITH PROPOFOL;  Surgeon: Lin Landsman, MD;  Location: [facility] ENDOSCOPY;  Service: Gastroenterology;  Laterality: N/A;  . ESOPHAGOGASTRODUODENOSCOPY (EGD) WITH PROPOFOL N/A 12/23/2018   Procedure: ESOPHAGOGASTRODUODENOSCOPY (EGD) WITH PROPOFOL;  Surgeon: Lin Landsman, MD;  Location: [facility] ENDOSCOPY;  Service: Gastroenterology;  Laterality: N/A;  . LAPAROSCOPIC APPENDECTOMY N/A 11/06/2018   Procedure: APPENDECTOMY LAPAROSCOPIC;  Surgeon: Herbert Pun, MD;  Location: [facility] ORS;  Service: General;  Laterality: N/A;    Family History  Problem Relation Age of Onset  . Non-Hodgkin's lymphoma Father 13  Basil Cell  . Pancreatic cancer Maternal Grandmother 65  . Thyroid cancer Maternal Grandmother 36  . Throat cancer Paternal Grandfather 34    Social History   Tobacco Use  . Smoking status: Never Smoker  . Smokeless  tobacco: Never Used  Substance Use Topics  . Alcohol use: Yes  . Drug use: No    Patient Active Problem List   Diagnosis Date Noted  . Chronic diarrhea of unknown origin   . Loss of weight   . Acute appendicitis with localized peritonitis 11/06/2018  . Essential hypertension 11/03/2018  . Hyperlipidemia, mixed 11/03/2018  . Degeneration of C5-C6 intervertebral disc 08/31/2018  . Herniation of left side of L4-L5 intervertebral disc 06/05/2017  . Obesity (BMI 30-39.9) 06/05/2017  . Chronic left-sided low back pain without sciatica 07/23/2015    Home Medications:    Current Meds  Medication Sig  . ALPRAZolam (XANAX) 1 MG tablet Take 1 mg by mouth at bedtime as needed for anxiety.  Marland Kitchen amphetamine-dextroamphetamine (ADDERALL XR) 30 MG 24 hr capsule   . amphetamine-dextroamphetamine (ADDERALL) 20 MG tablet Take 20 mg by mouth 2 (two) times a day.   Marland Kitchen atorvastatin (LIPITOR) 20 MG tablet TAKE ONE TABLET BY MOUTH DAILY.  Marland Kitchen dicyclomine (BENTYL) 10 MG capsule TAKE (1) CAPSULE BY MOUTH FOUR TIMES A DAY BEFORE MEALS AND AT BEDTIME.  . DULoxetine (CYMBALTA) 60 MG capsule Take 60 mg by mouth daily.   Marland Kitchen etonogestrel-ethinyl estradiol (NUVARING) 0.12-0.015 MG/24HR vaginal ring USE AS DIRECTED BY DOCTOR  . gabapentin (NEURONTIN) 300 MG capsule TAKE (1) CAPSULE BY MOUTH TWICE DAILY  . hydrOXYzine (VISTARIL) 25 MG capsule TAKE 1-2 CAPSULES BY MOUTH EVERY 6 HOURS.  Marland Kitchen lisinopril-hydrochlorothiazide (ZESTORETIC) 20-25 MG tablet TAKE (1) TABLET BY MOUTH EVERY DAY  . ondansetron (ZOFRAN) 4 MG tablet TAKE (1) TABLET BY MOUTH TWICE DAILY  . pantoprazole (PROTONIX) 20 MG tablet Take 20 mg by mouth 2 (two) times daily.  . pantoprazole (PROTONIX) 40 MG tablet   . promethazine (PHENERGAN) 25 MG tablet Take 1 tablet (25 mg total) by mouth at bedtime as needed for nausea or vomiting.  . valACYclovir (VALTREX) 1000 MG tablet Take 1 tablet (1,000 mg total) by mouth 2 (two) times daily as needed.    Allergies:    Shellfish allergy and Aloe vera  Review of Systems (ROS): Review of Systems  Constitutional: Positive for appetite change (decreased) and fatigue. Negative for chills and fever.  HENT: Negative for congestion, ear pain, rhinorrhea, sinus pressure, sinus pain and sore throat.   Respiratory: Negative for cough and shortness of breath.   Cardiovascular: Negative for chest pain and palpitations.  Gastrointestinal: Positive for abdominal pain, blood in stool (BRBPR), diarrhea, nausea and vomiting.  Genitourinary: Negative for dysuria and hematuria.  Musculoskeletal: Negative for back pain and myalgias.  Skin: Positive for pallor.  Neurological: Positive for dizziness, syncope (feelings of pre-syncope; "black cutain coming down"), weakness (generalized) and light-headedness. Negative for numbness.  All other systems reviewed and are negative.    Vital Signs: Today's Vitals   02/05/19 1521 02/05/19 1522 02/05/19 1629  BP:  (!) 67/52   Pulse:  (!) 115   Resp:  16   Temp:  98.2 F (36.8 C)   TempSrc:  Oral   SpO2:  100%   Weight:  214 lb (97.1 kg)   Height:  5' 11.5" (1.816 m)   PainSc: 6   6     Physical Exam: Physical Exam  Constitutional: She is oriented  to person, place, and time and well-developed, well-nourished, and in no distress. She appears to not be writhing in pain and not malnourished. No distress.  HENT:  Head: Normocephalic and atraumatic.  Nose: Nose normal.  Mouth/Throat: Uvula is midline. Mucous membranes are dry.  Eyes: Pupils are equal, round, and reactive to light. EOM are normal.  Neck: Normal range of motion. Neck supple. No tracheal deviation present.  Cardiovascular: Regular rhythm, normal heart sounds and intact distal pulses. Tachycardia present. Exam reveals no gallop and no friction rub.  No murmur heard. Pulmonary/Chest: Effort normal and breath sounds normal. No respiratory distress. She has no decreased breath sounds. She has no wheezes. She has no  rhonchi. She has no rales.  Abdominal: Soft. Normal appearance and bowel sounds are normal. There is generalized abdominal tenderness. There is no CVA tenderness.  Lymphadenopathy:    She has no cervical adenopathy.  Neurological: She is alert and oriented to person, place, and time. She has normal sensation and intact cranial nerves. She displays weakness (generalized). She displays no tremor and facial symmetry. Gait (unsteady 2/2 hypotension) abnormal. Coordination normal.  Skin: Skin is warm and dry. No rash noted. There is pallor.  Psychiatric: Mood, memory, affect and judgment normal.  Nursing note and vitals reviewed.   Urgent Care Treatments / Results:   LABS: PLEASE NOTE: all labs that were ordered this encounter are listed, however only abnormal results are displayed. Labs Reviewed  CBC WITH DIFFERENTIAL/PLATELET - Abnormal; Notable for the following components:      Result Value   WBC 17.1 (*)    Platelets 523 (*)    Neutro Abs 13.2 (*)    All other components within normal limits  COMPREHENSIVE METABOLIC PANEL - Abnormal; Notable for the following components:   Sodium 129 (*)    Potassium 2.6 (*)    Chloride 94 (*)    CO2 21 (*)    Glucose, Bld 130 (*)    Creatinine, Ser 1.92 (*)    GFR calc non Af Amer 33 (*)    GFR calc Af Amer 39 (*)    All other components within normal limits    EKG: -None  RADIOLOGY: -None  PROCEDURES: Procedures  MEDICATIONS RECEIVED THIS VISIT: Medications  ondansetron (ZOFRAN) injection 4 mg (4 mg Intravenous Given 02/05/19 1555)  0.9 %  sodium chloride infusion ( Intravenous Stopped 02/05/19 1600)  sodium chloride 0.9 % bolus 500 mL (0 mLs Intravenous Stopped 02/05/19 1624)    PERTINENT CLINICAL COURSE NOTES/UPDATES: Clinical Course as of Feb 04 1937  Sat Feb 05, 2019  1615 Spoke with Dr. Ronnald Ramp (parent of patient) to review clinical course. Discussed need for further evaluation, treatment, and admission to the hospital. Dr. Ronnald Ramp  agrees, however is asking to transport the patient herself. PIV will remain in place. Report called to Rosana Hoes, RN at [facility] ED.    [BG]    Clinical Course User Index [BG] Karen Kitchens, NP   Initial Impression / Assessment and Plan / Urgent Care Course:  Pertinent labs & imaging results that were available during my care of the patient were personally reviewed by me and considered in my medical decision making (see lab/imaging section of note for values and interpretations).  Molly Cisneros is a 33 y.o. female who presents to [facility] today with complaints of abdominal pain, diarrhea, and GI bleeding.   Patient is acutely ill appearing overall in clinic today. She does not appear to be in any  acute distress. Presenting symptoms (see HPI) and exam as documented above. Patient presents pre-syncopal and was found to be HYPOtensive with (+) documented orthostatsis (see VSFS). She is having generalized abdominal pain, but notes that this has been going on for months to varying degrees. She is seeing GI Marius Ditch, MD), and has an appointment with her next week. Plans are for VCE to further assess for source of GI bleeding.   Dr. Ronnald Ramp (parent) requesting labs today, which is certainly reasonable given presenting condition.  Again, patient's stool was guaiac positive today. PIV established and labs obtained. Patient was given a total of 1500 cc NS and ondansetron 4 mg IVP while waiting for labs to result. SBP improved to the low 90s. Labs revealed a WBC of 17.1 (ANC 13.2). H&H was normal. Na+ was low at 129 mmol/L. K+ critically low at 2.6 mmol/L. BUN 13 and creatinine 1.92 mg/dL. Estimated Creatinine Clearance: 53.4 mL/min (A) (by C-G formula based on SCr of 1.92 mg/dL (H)).  Patient unable to void. Discussed need for patient to be seen in a setting that would be able to provide a higher level of care. Patient needs additional IV hydration, pain medications, lab testing for sepsis rule out, IV antibiotics,  and CT imaging to assess for abdominal pathology. All of required interventions are not available in the urgent care setting today. Patient and family member agrees with plans as discussed given review of today's labs and current clinical condition despite clinic administered interventions.   Report called to [facility] ED charge nurse Rosana Hoes, RN). Discussed HPI, assessment, workup, and plans for transfer plan of care to her facility. Questions fielded. RN was advised to return call to [facility] staff with any questions or concerns related to the care that Molly Cisneros received here today. Patient will be presenting to the ED at [facility] driven by Dr. Otilio Miu (parent). Patient, Dr. Ronnald Ramp, and myself all feel comfortable leaving the 20 g PIV that was established in her LEFT upper arm in place for continued use upon arrival to the ED. Charge nurse made aware of IV access.   Final Clinical Impressions / Urgent Care Diagnoses:   Final diagnoses:  Diarrhea, unspecified type  Hematochezia  Non-intractable vomiting with nausea, unspecified vomiting type  Hypokalemia  Hyponatremia  Dehydration  Leukocytosis, unspecified type  Orthostatic hypotension    New Prescriptions:  [place] Controlled Substance Registry consulted? Not Applicable  Meds ordered this encounter  Medications  . ondansetron (ZOFRAN) injection 4 mg  . 0.9 %  sodium chloride bolus 1000 mL  . 0.9 %  sodium chloride bolus   500 mL    Recommended Follow up Care:   Follow-up Information    Go to  [place].   Specialty: Emergency Medicine Contact information: [place] [id number] [place] [phone]        NOTE: This note was prepared using Dragon dictation software along with smaller phrase technology. Despite my best ability to proofread, there is the potential that transcriptional errors may still occur from this process, and are completely  unintentional.     Karen Kitchens, NP 02/05/19 1940

## 2019-02-05 NOTE — ED Notes (Signed)
Date and time results received: 02/05/19 1805 (use smartphrase ".now" to insert current time)  Test: lactic Critical Value: 2.2  Name of Provider Notified: Charna Archer  Orders Received? Or Actions Taken?: Orders Received - See Orders for details

## 2019-02-05 NOTE — Discharge Instructions (Addendum)
Go to th ER now for further evaluation and probable admission to the hospital. I have spoken to the charge nurse.   Honor Loh, MSN, APRN, FNP-C, CEN Advanced Practice Provider [facility] 02/05/2019 4:20 PM

## 2019-02-05 NOTE — ED Notes (Signed)
Patient had episode of diarrhea with some blood, MD notified, ordered repeat H&H

## 2019-02-05 NOTE — ED Triage Notes (Signed)
Pt comes via POV from [place] with c/o mid lower abdominal pain. Pt states sever pain that causes Nausea and diarrhea.  Pt also states chills.  Pt states she recently had appendix removed 2 months ago.  Pt's BP at [place] was in lows 60s, They gave liter of fluids.  Current BP-96/67. Pt comes with IV established.

## 2019-02-05 NOTE — ED Provider Notes (Signed)
[facility] Emergency Department Provider Note   ____________________________________________   First MD Initiated Contact with Patient 02/05/19 1700     (approximate)  I have reviewed the triage vital signs and the nursing notes.   HISTORY  Chief Complaint Abdominal Pain    HPI Molly Cisneros is a 33 y.o. female with past medical history of hypertension anxiety who presents to the ED complaining of abdominal pain.  Patient reports that she has been dealing with intermittent abdominal pain and diarrhea since April.  She has had extensive work-up for this, including recent endoscopy and colonoscopy that were unremarkable.  She additionally had appendicitis approximately 2 months ago and had unremarkable appendectomy.  She has continued to have symptoms since then with a flareup of symptoms over the past week.  She describes abdominal pain as epigastric and severe at times, not exacerbated or alleviated by anything.  She has been having more frequent diarrhea recently with occasional dark blood in her stool.  She has been feeling lightheaded at times and like she might pass out.  She denies any fevers, dysuria, hematuria, cough, chest pain, or shortness of breath.  She was initially evaluated at an urgent care and referred to the ED when her lab work was significant for AKI, hypokalemia, and leukocytosis.        Past Medical History:  Diagnosis Date  . Anxiety   . Hypertension   . Shingles    reoccuring shingles on face    Patient Active Problem List   Diagnosis Date Noted  . Chronic diarrhea of unknown origin   . Loss of weight   . Acute appendicitis with localized peritonitis 11/06/2018  . Essential hypertension 11/03/2018  . Hyperlipidemia, mixed 11/03/2018  . Degeneration of C5-C6 intervertebral disc 08/31/2018  . Herniation of left side of L4-L5 intervertebral disc 06/05/2017  . Obesity (BMI 30-39.9) 06/05/2017  . Chronic left-sided low back pain  without sciatica 07/23/2015    Past Surgical History:  Procedure Laterality Date  . COLONOSCOPY WITH PROPOFOL N/A 12/23/2018   Procedure: COLONOSCOPY WITH PROPOFOL;  Surgeon: Lin Landsman, MD;  Location: [facility] ENDOSCOPY;  Service: Gastroenterology;  Laterality: N/A;  . ESOPHAGOGASTRODUODENOSCOPY (EGD) WITH PROPOFOL N/A 12/23/2018   Procedure: ESOPHAGOGASTRODUODENOSCOPY (EGD) WITH PROPOFOL;  Surgeon: Lin Landsman, MD;  Location[facility] ENDOSCOPY;  Service: Gastroenterology;  Laterality: N/A;  . LAPAROSCOPIC APPENDECTOMY N/A 11/06/2018   Procedure: APPENDECTOMY LAPAROSCOPIC;  Surgeon: Herbert Pun, MD;  Location: [facility] ORS;  Service: General;  Laterality: N/A;    Prior to Admission medications   Medication Sig Start Date End Date Taking? Authorizing Provider  ALPRAZolam Duanne Moron) 1 MG tablet Take 1 mg by mouth at bedtime as needed for anxiety.    [provider]  amitriptyline (ELAVIL) 50 MG tablet Take 1 tablet (50 mg total) by mouth at bedtime. 11/24/18 01/23/19  Lin Landsman, MD  amphetamine-dextroamphetamine (ADDERALL XR) 30 MG 24 hr capsule  09/27/18   [provider]  amphetamine-dextroamphetamine (ADDERALL) 20 MG tablet Take 20 mg by mouth 2 (two) times a day.  10/26/18   [provider]  atorvastatin (LIPITOR) 20 MG tablet TAKE ONE TABLET BY MOUTH DAILY. 11/22/18   Juline Patch, MD  cyclobenzaprine (FLEXERIL) 10 MG tablet  08/27/18   [provider]  dicyclomine (BENTYL) 10 MG capsule TAKE (1) CAPSULE BY MOUTH FOUR TIMES A DAY BEFORE MEALS AND AT BEDTIME. 12/21/18   Vanga, Tally Due, MD  DULoxetine (CYMBALTA) 60 MG capsule Take  60 mg by mouth daily.  07/18/15   [provider]  etonogestrel-ethinyl estradiol (NUVARING) 0.12-0.015 MG/24HR vaginal ring USE AS DIRECTED BY DOCTOR 11/02/17   Juline Patch, MD  gabapentin (NEURONTIN) 300 MG capsule TAKE (1) CAPSULE BY MOUTH TWICE DAILY 01/20/19   Glean Hess, MD  hydrOXYzine  (VISTARIL) 25 MG capsule TAKE 1-2 CAPSULES BY MOUTH EVERY 6 HOURS. 01/20/19   Glean Hess, MD  lisinopril-hydrochlorothiazide (ZESTORETIC) 20-25 MG tablet TAKE (1) TABLET BY MOUTH EVERY DAY 11/03/18   Glean Hess, MD  ondansetron (ZOFRAN) 4 MG tablet TAKE (1) TABLET BY MOUTH TWICE DAILY 12/10/18   Glean Hess, MD  pantoprazole (PROTONIX) 20 MG tablet Take 20 mg by mouth 2 (two) times daily.    [provider]  pantoprazole (PROTONIX) 40 MG tablet  10/07/18   [provider]  promethazine (PHENERGAN) 25 MG tablet Take 1 tablet (25 mg total) by mouth at bedtime as needed for nausea or vomiting. 12/08/18   Juline Patch, MD  valACYclovir (VALTREX) 1000 MG tablet Take 1 tablet (1,000 mg total) by mouth 2 (two) times daily as needed. 10/26/18   Glean Hess, MD    Allergies Shellfish allergy and Aloe vera  Family History  Problem Relation Age of Onset  . Non-Hodgkin's lymphoma Father 63       Basil Cell  . Pancreatic cancer Maternal Grandmother 101  . Thyroid cancer Maternal Grandmother 37  . Throat cancer Paternal Grandfather 93    Social History Social History   Tobacco Use  . Smoking status: Never Smoker  . Smokeless tobacco: Never Used  Substance Use Topics  . Alcohol use: Yes  . Drug use: No    Review of Systems  Constitutional: No fever/chills Eyes: No visual changes. ENT: No sore throat. Cardiovascular: Denies chest pain. Respiratory: Denies shortness of breath. Gastrointestinal: Positive for abdominal pain.  Positive for nausea, vomiting, diarrhea, and bloody stool.  No constipation. Genitourinary: Negative for dysuria. Musculoskeletal: Negative for back pain. Skin: Negative for rash. Neurological: Negative for headaches, focal weakness or numbness.  Positive for lightheadedness.  ____________________________________________   PHYSICAL EXAM:  VITAL SIGNS: ED Triage Vitals  Enc Vitals Group     BP 02/05/19 1654 96/67     Pulse  Rate 02/05/19 1654 98     Resp 02/05/19 1654 18     Temp 02/05/19 1654 98.2 F (36.8 C)     Temp Source 02/05/19 1654 Oral     SpO2 02/05/19 1654 99 %     Weight 02/05/19 1657 214 lb (97.1 kg)     Height 02/05/19 1657 5' 11.5" (1.816 m)     Head Circumference --      Peak Flow --      Pain Score 02/05/19 1656 7     Pain Loc --      Pain Edu? --      Excl. in [place]? --     Constitutional: Alert and oriented. Eyes: Conjunctivae are normal. Head: Atraumatic. Nose: No congestion/rhinnorhea. Mouth/Throat: Mucous membranes are moist. Neck: Normal ROM Cardiovascular: Normal rate, regular rhythm. Grossly normal heart sounds. Respiratory: Normal respiratory effort.  No retractions. Lungs CTAB. Gastrointestinal: Soft and diffusely tender with no rebound or guarding. No distention. Genitourinary: deferred Musculoskeletal: No lower extremity tenderness nor edema. Neurologic:  Normal speech and language. No gross focal neurologic deficits are appreciated. Skin:  Skin is warm, dry and intact. No rash noted. Psychiatric: Mood and affect are normal. Speech  and behavior are normal.  ____________________________________________   LABS (all labs ordered are listed, but only abnormal results are displayed)  Labs Reviewed  C DIFFICILE QUICK SCREEN W PCR REFLEX - Abnormal; Notable for the following components:      Result Value   C Diff antigen POSITIVE (*)    All other components within normal limits  CLOSTRIDIUM DIFFICILE BY PCR, REFLEXED - Abnormal; Notable for the following components:   Toxigenic C. Difficile by PCR POSITIVE (*)    All other components within normal limits  LACTIC ACID, PLASMA - Abnormal; Notable for the following components:   Lactic Acid, Venous 2.2 (*)    All other components within normal limits  LACTIC ACID, PLASMA - Abnormal; Notable for the following components:   Lactic Acid, Venous 2.0 (*)    All other components within normal limits  BASIC METABOLIC PANEL -  Abnormal; Notable for the following components:   Sodium 128 (*)    Potassium 2.6 (*)    CO2 20 (*)    Glucose, Bld 109 (*)    Creatinine, Ser 1.65 (*)    Calcium 8.4 (*)    GFR calc non Af Amer 40 (*)    GFR calc Af Amer 46 (*)    All other components within normal limits  CBC WITH DIFFERENTIAL/PLATELET - Abnormal; Notable for the following components:   WBC 17.2 (*)    HCT 35.5 (*)    Platelets 460 (*)    Neutro Abs 12.8 (*)    Monocytes Absolute 1.3 (*)    Abs Immature Granulocytes 0.10 (*)    All other components within normal limits  URINALYSIS, COMPLETE (UACMP) WITH MICROSCOPIC - Abnormal; Notable for the following components:   Color, Urine YELLOW (*)    APPearance HAZY (*)    Specific Gravity, Urine 1.004 (*)    All other components within normal limits  MAGNESIUM - Abnormal; Notable for the following components:   Magnesium 1.6 (*)    All other components within normal limits  CULTURE, BLOOD (ROUTINE X 2)  CULTURE, BLOOD (ROUTINE X 2)  SARS CORONAVIRUS 2 (TAT 6-24 HRS)  HEPATIC FUNCTION PANEL  LIPASE, BLOOD  POC URINE PREG, ED  POCT PREGNANCY, URINE   ____________________________________________  EKG  ED ECG REPORT I, Blake Divine, the attending physician, personally viewed and interpreted this ECG.   Date: 02/05/2019  EKG Time: 17:31  Rate: 94  Rhythm: normal sinus rhythm  Axis: Normal  Intervals:none  ST&T Change: None    PROCEDURES  Procedure(s) performed (including Critical Care):  Procedures   ____________________________________________   INITIAL IMPRESSION / ASSESSMENT AND PLAN / ED COURSE       33 year old female presenting to the ED with months of intermittent abdominal pain and diarrhea with bloody stools, thus far has had unremarkable work-up including endoscopy and colonoscopy.  Her symptoms sound like inflammatory bowel disease despite her negative endoscopy and colonoscopy.  She does have tenderness throughout her abdominal  exam, will CT abdomen.  She had borderline low blood pressure in triage, likely secondary to dehydration given her ongoing diarrhea although concern for sepsis given her leukocytosis at urgent, will start IV fluid hydration, draw blood cultures and lactate.  Will need to ensure she is not pregnant, although this seems unlikely as she denies any sexual activity.  Labs from urgent care also significant for severe hypokalemia, will replete potassium and magnesium.  Pregnancy testing negative.  CT negative for acute process, does appear consistent with diarrheal illness.  Patient's C. difficile testing is positive, will start p.o. vancomycin.  Her blood pressure continues to improve, now on second liter of IV fluids.  We will continue repletion of potassium, but given electrolyte issues, AKI, and significant dehydration, will admit for further management.  Case discussed with hospitalist, who accepts patient for admission.      ____________________________________________   FINAL CLINICAL IMPRESSION(S) / ED DIAGNOSES  Final diagnoses:  Clostridium difficile colitis  Hypokalemia  Dehydration  AKI (acute kidney injury) [facility])     ED Discharge Orders    None       Note:  This document was prepared using Dragon voice recognition software and may include unintentional dictation errors.   Blake Divine, MD 02/05/19 2040

## 2019-02-05 NOTE — ED Notes (Signed)
Patient transported to CT 

## 2019-02-05 NOTE — ED Notes (Signed)
ED TO INPATIENT HANDOFF REPORT  ED Nurse Name and Phone #: Anda Kraft [phone]  S Name/Age/Gender Molly Cisneros 33 y.o. female Room/Bed: ED06A/ED06A  Code Status   Code Status: Prior  Home/SNF/Other Home Patient oriented to: self, place, time and situation Is this baseline? Yes   Triage Complete: Triage complete  Chief Complaint Abdominal Pain  Triage Note Pt comes via POV from [place] with c/o mid lower abdominal pain. Pt states sever pain that causes Nausea and diarrhea.  Pt also states chills.  Pt states she recently had appendix removed 2 months ago.  Pt's BP at [place] was in lows 60s, They gave liter of fluids.  Current BP-96/67. Pt comes with IV established.     Allergies Allergies  Allergen Reactions  . Shellfish Allergy Anaphylaxis  . Aloe Vera Dermatitis    Level of Care/Admitting Diagnosis ED Disposition    ED Disposition Condition [facility] Area: [place] [[id number]]  Level of Care: Med-Surg [16]  Covid Evaluation: Asymptomatic Screening Protocol (No Symptoms)  [facility]: [id number]  Admitting Physician: Lance Coon [id number]  Attending Physician: Lance Coon [phone]  Estimated length of stay: past midnight tomorrow  Certification:: I certify this patient will need inpatient services for at least 2 midnights  PT Class (Do Not Modify): Inpatient [101]  PT Acc Code (Do Not Modify): Private [1]       B Medical/Surgery History Past Medical History:  Diagnosis Date  . Anxiety   . Hypertension   . Shingles    reoccuring shingles on face   Past Surgical History:  Procedure Laterality Date  . COLONOSCOPY WITH PROPOFOL N/A 12/23/2018   Procedure: COLONOSCOPY WITH PROPOFOL;  Surgeon: Lin Landsman, MD;  Location: [facility] ENDOSCOPY;  Service: Gastroenterology;  Laterality: N/A;  . ESOPHAGOGASTRODUODENOSCOPY (EGD) WITH PROPOFOL N/A 12/23/2018   Procedure: ESOPHAGOGASTRODUODENOSCOPY (EGD) WITH PROPOFOL;   Surgeon: Lin Landsman, MD;  Location: [facility] ENDOSCOPY;  Service: Gastroenterology;  Laterality: N/A;  . LAPAROSCOPIC APPENDECTOMY N/A 11/06/2018   Procedure: APPENDECTOMY LAPAROSCOPIC;  Surgeon: Herbert Pun, MD;  Location: [facility] ORS;  Service: General;  Laterality: N/A;     A IV Location/Drains/Wounds Patient Lines/Drains/Airways Status   Active Line/Drains/Airways    Name:   Placement date:   Placement time:   Site:   Days:   Peripheral IV 02/05/19 Left Arm   02/05/19    1554    Arm   less than 1   Peripheral IV 02/05/19 Right Arm   02/05/19    1722    Arm   less than 1   Incision - 3 Ports Abdomen Umbilicus Left;Lower Left;Lower   11/06/18    2131     91          Intake/Output Last 24 hours No intake or output data in the 24 hours ending 02/05/19 2119  Labs/Imaging Results for orders placed or performed during the hospital encounter of 02/05/19 (from the past 48 hour(s))  Basic metabolic panel     Status: Abnormal   Collection Time: 02/05/19  4:58 PM  Result Value Ref Range   Sodium 128 (L) 135 - 145 mmol/L   Potassium 2.6 (LL) 3.5 - 5.1 mmol/L    Comment: CRITICAL RESULT CALLED TO, READ BACK BY AND VERIFIED WITH SAMANTHA HAMILTON 02/05/2019 1732 KBH    Chloride 98 98 - 111 mmol/L   CO2 20 (L) 22 - 32 mmol/L   Glucose, Bld 109 (H) 70 - 99 mg/dL  BUN 13 6 - 20 mg/dL   Creatinine, Ser 1.65 (H) 0.44 - 1.00 mg/dL   Calcium 8.4 (L) 8.9 - 10.3 mg/dL   GFR calc non Af Amer 40 (L) >60 mL/min   GFR calc Af Amer 46 (L) >60 mL/min   Anion gap 10 5 - 15    Comment: Performed at [facility], [address] [place], [place] [address]  Hepatic function panel     Status: None   Collection Time: 02/05/19  4:58 PM  Result Value Ref Range   Total Protein 6.6 6.5 - 8.1 g/dL   Albumin 3.6 3.5 - 5.0 g/dL   AST 17 15 - 41 U/L   ALT 28 0 - 44 U/L   Alkaline Phosphatase 61 38 - 126 U/L   Total Bilirubin 0.5 0.3 - 1.2 mg/dL   Bilirubin, Direct <0.1 0.0 - 0.2 mg/dL    Indirect Bilirubin NOT CALCULATED 0.3 - 0.9 mg/dL    Comment: Performed at [facility], [address] [place], [place] [address]  Lipase, blood     Status: None   Collection Time: 02/05/19  4:58 PM  Result Value Ref Range   Lipase 23 11 - 51 U/L    Comment: Performed at [facility], [address] [place], [place] [address]  CBC with Differential     Status: Abnormal   Collection Time: 02/05/19  4:58 PM  Result Value Ref Range   WBC 17.2 (H) 4.0 - 10.5 K/uL   RBC 4.27 3.87 - 5.11 MIL/uL   Hemoglobin 12.6 12.0 - 15.0 g/dL   HCT 35.5 (L) 36.0 - 46.0 %   MCV 83.1 80.0 - 100.0 fL   MCH 29.5 26.0 - 34.0 pg   MCHC 35.5 30.0 - 36.0 g/dL   RDW 12.7 11.5 - 15.5 %   Platelets 460 (H) 150 - 400 K/uL   nRBC 0.0 0.0 - 0.2 %   Neutrophils Relative % 75 %   Neutro Abs 12.8 (H) 1.7 - 7.7 K/uL   Lymphocytes Relative 17 %   Lymphs Abs 3.0 0.7 - 4.0 K/uL   Monocytes Relative 7 %   Monocytes Absolute 1.3 (H) 0.1 - 1.0 K/uL   Eosinophils Relative 0 %   Eosinophils Absolute 0.0 0.0 - 0.5 K/uL   Basophils Relative 0 %   Basophils Absolute 0.1 0.0 - 0.1 K/uL   Immature Granulocytes 1 %   Abs Immature Granulocytes 0.10 (H) 0.00 - 0.07 K/uL    Comment: Performed at [facility], [address] [place], [place] [address]  Urinalysis, Complete w Microscopic     Status: Abnormal   Collection Time: 02/05/19  4:58 PM  Result Value Ref Range   Color, Urine YELLOW (A) YELLOW   APPearance HAZY (A) CLEAR   Specific Gravity, Urine 1.004 (L) 1.005 - 1.030   pH 5.0 5.0 - 8.0   Glucose, UA NEGATIVE NEGATIVE mg/dL   Hgb urine dipstick NEGATIVE NEGATIVE   Bilirubin Urine NEGATIVE NEGATIVE   Ketones, ur NEGATIVE NEGATIVE mg/dL   Protein, ur NEGATIVE NEGATIVE mg/dL   Nitrite NEGATIVE NEGATIVE   Leukocytes,Ua NEGATIVE NEGATIVE   RBC / HPF 0-5 0 - 5 RBC/hpf   WBC, UA 0-5 0 - 5 WBC/hpf   Bacteria, UA NONE SEEN NONE SEEN   Squamous Epithelial / LPF 0-5 0 - 5   Mucus PRESENT     Hyaline Casts, UA PRESENT     Comment: Performed at [facility],  [place], [place] [address]  C difficile quick scan w PCR reflex     Status: Abnormal   Collection Time: 02/05/19  4:58 PM   Specimen: STOOL  Result Value Ref Range   C Diff antigen POSITIVE (A) NEGATIVE   C Diff toxin NEGATIVE NEGATIVE   C Diff interpretation Results are indeterminate. See PCR results.     Comment: Performed at [facility], [address] [place], [place] [address]  Magnesium     Status: Abnormal   Collection Time: 02/05/19  4:58 PM  Result Value Ref Range   Magnesium 1.6 (L) 1.7 - 2.4 mg/dL    Comment: Performed at [facility], [address] [place], [place] [address]  C. Diff by PCR, Reflexed     Status: Abnormal   Collection Time: 02/05/19  4:58 PM  Result Value Ref Range   Toxigenic C. Difficile by PCR POSITIVE (A) NEGATIVE    Comment: Positive for toxigenic C. difficile with little to no toxin production. Only treat if clinical presentation suggests symptomatic illness. Performed at [facility], [address] [place], [place] [address]   Lactic acid, plasma     Status: Abnormal   Collection Time: 02/05/19  5:09 PM  Result Value Ref Range   Lactic Acid, Venous 2.2 (HH) 0.5 - 1.9 mmol/L    Comment: CRITICAL RESULT CALLED TO, READ BACK BY AND VERIFIED WITH SAMANTHA HAMILTON @1805  02/05/19 MJU Performed at [facility], [address] [place], [place] [address]   Pregnancy, urine POC     Status: None   Collection Time: 02/05/19  6:25 PM  Result Value Ref Range   Preg Test, Ur NEGATIVE NEGATIVE    Comment:        THE SENSITIVITY OF THIS METHODOLOGY IS >24 mIU/mL   Lactic acid, plasma     Status: Abnormal   Collection Time: 02/05/19  7:25 PM  Result Value Ref Range   Lactic Acid, Venous 2.0 (HH) 0.5 - 1.9 mmol/L    Comment: CRITICAL VALUE NOTED. VALUE IS CONSISTENT WITH PREVIOUSLY REPORTED/CALLED VALUE MJU Performed  at [facility], [address] [place], [place] [address]    Ct Abdomen Pelvis W Contrast  Result Date: 02/05/2019 CLINICAL DATA:  Abdominal pain, nausea, diarrhea, appendectomy 2 months ago EXAM: CT ABDOMEN AND PELVIS WITH CONTRAST TECHNIQUE: Multidetector CT imaging of the abdomen and pelvis was performed using the standard protocol following bolus administration of intravenous contrast. CONTRAST:  179mL OMNIPAQUE IOHEXOL 300 MG/ML  SOLN COMPARISON:  11/06/2018 FINDINGS: Lower chest: No acute abnormality. Hepatobiliary: No solid liver abnormality is seen. No gallstones, gallbladder wall thickening, or biliary dilatation. Pancreas: Unremarkable. No pancreatic ductal dilatation or surrounding inflammatory changes. Spleen: Normal in size without significant abnormality. Adrenals/Urinary Tract: Adrenal glands are unremarkable. Kidneys are normal, without renal calculi, solid lesion, or hydronephrosis. Bladder is unremarkable. Stomach/Bowel: Stomach is within normal limits. Status post appendectomy. No evidence of bowel wall thickening, distention, or inflammatory changes. The colon is fluid-filled to the rectum. Vascular/Lymphatic: No significant vascular findings are present. No enlarged abdominal or pelvic lymph nodes. Reproductive: No mass or other significant abnormality. Multiple phleboliths in the pelvis. Other: No abdominal wall hernia or abnormality. No abdominopelvic ascites. Musculoskeletal: No acute or significant osseous findings. IMPRESSION: 1. The colon is fluid-filled to the rectum, in keeping with diarrheal illness. There are no inflammatory findings of the colon. 2. Status post appendectomy without evidence of postoperative complication. Electronically Signed  By: Eddie Candle M.D.   On: 02/05/2019 19:00   Dg Chest Portable 1 View  Result Date: 02/05/2019 CLINICAL DATA:  Chest pain EXAM: PORTABLE CHEST 1 VIEW COMPARISON:  None. FINDINGS: The heart size and mediastinal contours  are within normal limits. Both lungs are clear. The visualized skeletal structures are unremarkable. IMPRESSION: No acute cardiopulmonary process. Electronically Signed   By: Prudencio Pair M.D.   On: 02/05/2019 18:27    Pending Labs Unresulted Labs (From admission, onward)    Start     Ordered   02/05/19 2058  Hemoglobin and hematocrit, blood  ONCE - STAT,   STAT     02/05/19 2057   02/05/19 2038  SARS CORONAVIRUS 2 (TAT 6-24 HRS) Nasopharyngeal Nasopharyngeal Swab  (Asymptomatic/Tier 2 Patients Labs)  Once,   STAT    Question Answer Comment  Is this test for diagnosis or screening Screening   Symptomatic for COVID-19 as defined by CDC No   Hospitalized for COVID-19 No   Admitted to ICU for COVID-19 No   Previously tested for COVID-19 Yes   Resident in a congregate (group) care setting No   Employed in healthcare setting No   Pregnant No      02/05/19 2037   02/05/19 1704  Culture, blood (routine x 2)  BLOOD CULTURE X 2,   STAT     02/05/19 1703   Signed and Held  CBC  (enoxaparin (LOVENOX)    CrCl >/= 30 ml/min)  Once,   R    Comments: Baseline for enoxaparin therapy IF NOT ALREADY DRAWN.  Notify MD if PLT < 100 K.    Signed and Held   Signed and Held  Creatinine, serum  (enoxaparin (LOVENOX)    CrCl >/= 30 ml/min)  Once,   R    Comments: Baseline for enoxaparin therapy IF NOT ALREADY DRAWN.    Signed and Held   Signed and Held  Creatinine, serum  (enoxaparin (LOVENOX)    CrCl >/= 30 ml/min)  Weekly,   R    Comments: while on enoxaparin therapy    Signed and Held   Signed and Held  Basic metabolic panel  Tomorrow morning,   R     Signed and Held   Signed and Held  CBC  Tomorrow morning,   R     Signed and Held          Vitals/Pain Today's Vitals   02/05/19 2000 02/05/19 2016 02/05/19 2030 02/05/19 2100  BP: 103/61  (!) 103/59 115/75  Pulse: 94  96 99  Resp: 16  14 15   Temp:      TempSrc:      SpO2: 100%  99% 100%  Weight:      Height:      PainSc:  4        Isolation Precautions Enteric precautions (UV disinfection)  Medications Medications  potassium chloride 10 mEq in 100 mL IVPB (10 mEq Intravenous New Bag/Given 02/05/19 2115)  vancomycin (VANCOCIN) 50 mg/mL oral solution 125 mg (125 mg Oral Given 02/05/19 1942)  magnesium sulfate IVPB 2 g 50 mL (has no administration in time range)  lactated ringers bolus 1,000 mL (0 mLs Intravenous Stopped 02/05/19 1940)  morphine 4 MG/ML injection 4 mg (4 mg Intravenous Given 02/05/19 1726)  ondansetron (ZOFRAN) injection 4 mg (4 mg Intravenous Given 02/05/19 1726)  magnesium sulfate IVPB 2 g 50 mL (0 g Intravenous Stopped 02/05/19 1922)  iohexol (OMNIPAQUE) 300 MG/ML solution 100  mL (100 mLs Intravenous Contrast Given 02/05/19 1840)  lactated ringers bolus 1,000 mL (0 mLs Intravenous Stopped 02/05/19 2110)  potassium chloride SA (K-DUR) CR tablet 40 mEq (40 mEq Oral Given 02/05/19 1942)  HYDROmorphone (DILAUDID) injection 0.5 mg (0.5 mg Intravenous Given 02/05/19 1944)    Mobility walks Low fall risk   Focused Assessments GI ASSESSMENT   R Recommendations: See Admitting Provider Note  Report given to:   Additional Notes: CDIFF POSITIVE

## 2019-02-05 NOTE — ED Notes (Signed)
Date and time results received: 02/05/19 1738 (use smartphrase ".now" to insert current time)  Test: potassium Critical Value: 2.6  Name of Provider Notified: Charna Archer  Orders Received? Or Actions Taken?: Orders Received - See Orders for details

## 2019-02-05 NOTE — ED Notes (Signed)
Pt up to toilet, pt had very loose reddish brown liquid stool

## 2019-02-06 DIAGNOSIS — A0472 Enterocolitis due to Clostridium difficile, not specified as recurrent: Secondary | ICD-10-CM

## 2019-02-06 LAB — CBC
HCT: 34 % — ABNORMAL LOW (ref 36.0–46.0)
Hemoglobin: 11.7 g/dL — ABNORMAL LOW (ref 12.0–15.0)
MCH: 29.2 pg (ref 26.0–34.0)
MCHC: 34.4 g/dL (ref 30.0–36.0)
MCV: 84.8 fL (ref 80.0–100.0)
Platelets: 405 10*3/uL — ABNORMAL HIGH (ref 150–400)
RBC: 4.01 MIL/uL (ref 3.87–5.11)
RDW: 12.8 % (ref 11.5–15.5)
WBC: 11.2 10*3/uL — ABNORMAL HIGH (ref 4.0–10.5)
nRBC: 0 % (ref 0.0–0.2)

## 2019-02-06 LAB — BASIC METABOLIC PANEL
Anion gap: 8 (ref 5–15)
BUN: 10 mg/dL (ref 6–20)
CO2: 21 mmol/L — ABNORMAL LOW (ref 22–32)
Calcium: 8.3 mg/dL — ABNORMAL LOW (ref 8.9–10.3)
Chloride: 105 mmol/L (ref 98–111)
Creatinine, Ser: 0.97 mg/dL (ref 0.44–1.00)
GFR calc Af Amer: >60 mL/min (ref >60)
GFR calc non Af Amer: >60 mL/min (ref >60)
Glucose, Bld: 99 mg/dL (ref 70–99)
Potassium: 3 mmol/L — ABNORMAL LOW (ref 3.5–5.1)
Sodium: 134 mmol/L — ABNORMAL LOW (ref 135–145)

## 2019-02-06 LAB — SARS CORONAVIRUS 2 (TAT 6-24 HRS): SARS Coronavirus 2: NEGATIVE

## 2019-02-06 MED ORDER — DICYCLOMINE HCL 20 MG PO TABS
20.0000 mg | ORAL_TABLET | Freq: Three times a day (TID) | ORAL | Status: DC
  Administered 2019-02-06 – 2019-02-09 (×10): 20 mg via ORAL
  Filled 2019-02-06 (×11): qty 1

## 2019-02-06 MED ORDER — AMITRIPTYLINE HCL 75 MG PO TABS
75.0000 mg | ORAL_TABLET | Freq: Every day | ORAL | Status: DC
  Administered 2019-02-06 – 2019-02-08 (×3): 75 mg via ORAL
  Filled 2019-02-06 (×4): qty 1

## 2019-02-06 MED ORDER — POTASSIUM CHLORIDE CRYS ER 20 MEQ PO TBCR
40.0000 meq | EXTENDED_RELEASE_TABLET | ORAL | Status: AC
  Administered 2019-02-06 (×2): 40 meq via ORAL
  Filled 2019-02-06 (×2): qty 2

## 2019-02-06 NOTE — Progress Notes (Signed)
[place] at [facility]                                                                                                                                                                                  Patient Demographics   Molly Cisneros, is a 33 y.o. female, DOB - 06-09-1985, ZC:[id number]  Admit date - 02/05/2019   Admitting Physician Lance Coon, MD  Outpatient Primary MD for the patient is Glean Hess, MD   LOS - 1  Subjective: Patient admitted with diarrhea and diarrhea is improved.  She also has a history of chronic diarrhea.  However over the past 1 week diarrhea had worsened.    Review of Systems:   CONSTITUTIONAL: No documented fever. No fatigue, weakness. No weight gain, no weight loss.  EYES: No blurry or double vision.  ENT: No tinnitus. No postnasal drip. No redness of the oropharynx.  RESPIRATORY: No cough, no wheeze, no hemoptysis. No dyspnea.  CARDIOVASCULAR: No chest pain. No orthopnea. No palpitations. No syncope.  GASTROINTESTINAL: No nausea, no vomiting or positive diarrhea. No abdominal pain. No melena or hematochezia.  GENITOURINARY: No dysuria or hematuria.  ENDOCRINE: No polyuria or nocturia. No heat or cold intolerance.  HEMATOLOGY: No anemia. No bruising. No bleeding.  INTEGUMENTARY: No rashes. No lesions.  MUSCULOSKELETAL: No arthritis. No swelling. No gout.  NEUROLOGIC: No numbness, tingling, or ataxia. No seizure-type activity.  PSYCHIATRIC: No anxiety. No insomnia. No ADD.    Vitals:   Vitals:   02/05/19 2100 02/05/19 2130 02/05/19 2221 02/06/19 0526  BP: 115/75 107/68 125/80 100/67  Pulse: 99 100 98 89  Resp: 15 16 18 18   Temp:   98 F (36.7 C) 97.9 F (36.6 C)  TempSrc:   Oral Oral  SpO2: 100% 98% 99% 97%  Weight:      Height:        Wt Readings from Last 3 Encounters:  02/05/19 97.1 kg  02/05/19 97.1 kg  12/23/18 97.1 kg     Intake/Output Summary (Last 24 hours) at 02/06/2019 1150 Last data filed  at 02/06/2019 0320 Gross per 24 hour  Intake 1043.85 ml  Output -  Net 1043.85 ml    Physical Exam:   GENERAL: Pleasant-appearing in no apparent distress.  HEAD, EYES, EARS, NOSE AND THROAT: Atraumatic, normocephalic. Extraocular muscles are intact. Pupils equal and reactive to light. Sclerae anicteric. No conjunctival injection. No oro-pharyngeal erythema.  NECK: Supple. There is no jugular venous distention. No bruits, no lymphadenopathy, no thyromegaly.  HEART: Regular rate and rhythm,. No murmurs, no rubs, no clicks.  LUNGS: Clear to auscultation bilaterally. No rales or rhonchi. No wheezes.  ABDOMEN: Soft,  flat, nontender, nondistended. Has good bowel sounds. No hepatosplenomegaly appreciated.  EXTREMITIES: No evidence of any cyanosis, clubbing, or peripheral edema.  +2 pedal and radial pulses bilaterally.  NEUROLOGIC: The patient is alert, awake, and oriented x3 with no focal motor or sensory deficits appreciated bilaterally.  SKIN: Moist and warm with no rashes appreciated.  Psych: Not anxious, depressed LN: No inguinal LN enlargement    Antibiotics   Anti-infectives (From admission, onward)   Start     Dose/Rate Route Frequency Ordered Stop   02/05/19 2000  vancomycin (VANCOCIN) 50 mg/mL oral solution 125 mg     125 mg Oral 4 times daily 02/05/19 1907 02/15/19 1859      Medications   Scheduled Meds: . amitriptyline  75 mg Oral QHS  . atorvastatin  20 mg Oral q1800  . dicyclomine  20 mg Oral TID AC  . DULoxetine  60 mg Oral Daily  . enoxaparin (LOVENOX) injection  40 mg Subcutaneous QHS  . influenza vaccine adjuvanted  0.5 mL Intramuscular Tomorrow-1000  . pantoprazole  40 mg Oral BID  . vancomycin  125 mg Oral QID   Continuous Infusions: . sodium chloride 100 mL/hr at 02/06/19 1053   PRN Meds:.acetaminophen **OR** acetaminophen, ALPRAZolam, HYDROmorphone (DILAUDID) injection, hydrOXYzine, ondansetron **OR** ondansetron (ZOFRAN) IV, oxyCODONE   Data Review:    Micro Results Recent Results (from the past 240 hour(s))  C difficile quick scan w PCR reflex     Status: Abnormal   Collection Time: 02/05/19  4:58 PM   Specimen: STOOL  Result Value Ref Range Status   C Diff antigen POSITIVE (A) NEGATIVE Final   C Diff toxin NEGATIVE NEGATIVE Final   C Diff interpretation Results are indeterminate. See PCR results.  Final    Comment: Performed at [facility], [address] [place], [place] [address]  C. Diff by PCR, Reflexed     Status: Abnormal   Collection Time: 02/05/19  4:58 PM  Result Value Ref Range Status   Toxigenic C. Difficile by PCR POSITIVE (A) NEGATIVE Final    Comment: Positive for toxigenic C. difficile with little to no toxin production. Only treat if clinical presentation suggests symptomatic illness. Performed at [facility], [address] [place], [place] [address]   Culture, blood (routine x 2)     Status: None (Preliminary result)   Collection Time: 02/05/19  5:09 PM   Specimen: BLOOD  Result Value Ref Range Status   Specimen Description BLOOD BLOOD LEFT HAND  Final   Special Requests   Final    BOTTLES DRAWN AEROBIC AND ANAEROBIC Blood Culture adequate volume   Culture   Final    NO GROWTH < 12 HOURS Performed at [facility], [address], [place], [place] [address]    Report Status PENDING  Incomplete  Culture, blood (routine x 2)     Status: None (Preliminary result)   Collection Time: 02/05/19  5:09 PM   Specimen: BLOOD  Result Value Ref Range Status   Specimen Description BLOOD RIGHT ANTECUBITAL  Final   Special Requests   Final    BOTTLES DRAWN AEROBIC AND ANAEROBIC Blood Culture adequate volume   Culture   Final    NO GROWTH < 12 HOURS Performed at [facility], [place], [place] [address]    Report Status PENDING  Incomplete  SARS CORONAVIRUS 2 (TAT 6-24 HRS) Nasopharyngeal Nasopharyngeal Swab     Status: None   Collection Time: 02/05/19  8:52  PM   Specimen: Nasopharyngeal Swab  Result Value Ref Range Status   SARS Coronavirus 2 NEGATIVE NEGATIVE Final    Comment: (NOTE) SARS-CoV-2 target nucleic acids are NOT DETECTED. The SARS-CoV-2 RNA is generally detectable in upper and lower respiratory specimens during the acute phase of infection. Negative results do not preclude SARS-CoV-2 infection, do not rule out co-infections with other pathogens, and should not be used as the sole basis for treatment or other patient management decisions. Negative results must be combined with clinical observations, patient history, and epidemiological information. The expected result is Negative. Fact Sheet for Patients: SugarRoll.be Fact Sheet for Healthcare Providers: https://www.woods-mathews.com/ This test is not yet approved or cleared by the [place] FDA and  has been authorized for detection and/or diagnosis of SARS-CoV-2 by FDA under an Emergency Use Authorization (EUA). This EUA will remain  in effect (meaning this test can be used) for the duration of the COVID-19 declaration under Section 56 4(b)(1) of the Act, 21 U.S.C. section 360bbb-3(b)(1), unless the authorization is terminated or revoked sooner. Performed at [facility], [place] [address], [place], [place] [address]     Radiology Reports Ct Abdomen Pelvis W Contrast  Result Date: 02/05/2019 CLINICAL DATA:  Abdominal pain, nausea, diarrhea, appendectomy 2 months ago EXAM: CT ABDOMEN AND PELVIS WITH CONTRAST TECHNIQUE: Multidetector CT imaging of the abdomen and pelvis was performed using the standard protocol following bolus administration of intravenous contrast. CONTRAST:  181mL OMNIPAQUE IOHEXOL 300 MG/ML  SOLN COMPARISON:  11/06/2018 FINDINGS: Lower chest: No acute abnormality. Hepatobiliary: No solid liver abnormality is seen. No gallstones, gallbladder wall thickening, or biliary dilatation. Pancreas: Unremarkable. No  pancreatic ductal dilatation or surrounding inflammatory changes. Spleen: Normal in size without significant abnormality. Adrenals/Urinary Tract: Adrenal glands are unremarkable. Kidneys are normal, without renal calculi, solid lesion, or hydronephrosis. Bladder is unremarkable. Stomach/Bowel: Stomach is within normal limits. Status post appendectomy. No evidence of bowel wall thickening, distention, or inflammatory changes. The colon is fluid-filled to the rectum. Vascular/Lymphatic: No significant vascular findings are present. No enlarged abdominal or pelvic lymph nodes. Reproductive: No mass or other significant abnormality. Multiple phleboliths in the pelvis. Other: No abdominal wall hernia or abnormality. No abdominopelvic ascites. Musculoskeletal: No acute or significant osseous findings. IMPRESSION: 1. The colon is fluid-filled to the rectum, in keeping with diarrheal illness. There are no inflammatory findings of the colon. 2. Status post appendectomy without evidence of postoperative complication. Electronically Signed   By: Eddie Candle M.D.   On: 02/05/2019 19:00   Dg Chest Portable 1 View  Result Date: 02/05/2019 CLINICAL DATA:  Chest pain EXAM: PORTABLE CHEST 1 VIEW COMPARISON:  None. FINDINGS: The heart size and mediastinal contours are within normal limits. Both lungs are clear. The visualized skeletal structures are unremarkable. IMPRESSION: No acute cardiopulmonary process. Electronically Signed   By: Prudencio Pair M.D.   On: 02/05/2019 18:27     CBC Recent Labs  Lab 02/05/19 1541 02/05/19 1658 02/05/19 2113 02/06/19 0455  WBC 17.1* 17.2*  --  11.2*  HGB 13.8 12.6 11.9* 11.7*  HCT 39.1 35.5* 34.3* 34.0*  PLT 523* 460*  --  405*  MCV 83.2 83.1  --  84.8  MCH 29.4 29.5  --  29.2  MCHC 35.3 35.5  --  34.4  RDW 12.8 12.7  --  12.8  LYMPHSABS 2.9 3.0  --   --   MONOABS 0.8 1.3*  --   --   EOSABS 0.0 0.0  --   --  BASOSABS 0.1 0.1  --   --     Chemistries  Recent Labs  Lab  02/05/19 1541 02/05/19 1658 02/06/19 0455  NA 129* 128* 134*  K 2.6* 2.6* 3.0*  CL 94* 98 105  CO2 21* 20* 21*  GLUCOSE 130* 109* 99  BUN 13 13 10   CREATININE 1.92* 1.65* 0.97  CALCIUM 9.3 8.4* 8.3*  MG  --  1.6*  --   AST 19 17  --   ALT 32 28  --   ALKPHOS 71 61  --   BILITOT 0.7 0.5  --    ------------------------------------------------------------------------------------------------------------------ estimated creatinine clearance is 105.8 mL/min (by C-G formula based on SCr of 0.97 mg/dL). ------------------------------------------------------------------------------------------------------------------ No results for input(s): HGBA1C in the last 72 hours. ------------------------------------------------------------------------------------------------------------------ No results for input(s): CHOL, HDL, LDLCALC, TRIG, CHOLHDL, LDLDIRECT in the last 72 hours. ------------------------------------------------------------------------------------------------------------------ No results for input(s): TSH, T4TOTAL, T3FREE, THYROIDAB in the last 72 hours.  Invalid input(s): FREET3 ------------------------------------------------------------------------------------------------------------------ No results for input(s): VITAMINB12, FOLATE, FERRITIN, TIBC, IRON, RETICCTPCT in the last 72 hours.  Coagulation profile No results for input(s): INR, PROTIME in the last 168 hours.  No results for input(s): DDIMER in the last 72 hours.  Cardiac Enzymes No results for input(s): CKMB, TROPONINI, MYOGLOBIN in the last 168 hours.  Invalid input(s): CK ------------------------------------------------------------------------------------------------------------------ Invalid input(s): [place]  Patient is 33 year old presenting with diarrhea noted to have C. difficile     1.  Severe sepsis ([place]) -due to C. difficile,  Continue p.o. vancomycin Appreciate GI  input  2.  Acute on chronic diarrhea as #1  3.   Hypokalemia -still low we will give her potassium check magnesium  4.   Hypomagnesemia -replace and monitor  5.   Essential hypertension blood pressure meds on hold  6.  Miscellaneous Lovenox for DVT prophylaxis    Code Status Orders  (From admission, onward)         Start     Ordered   02/05/19 2230  Full code  Continuous     02/05/19 2230        Code Status History    Date Active Date Inactive Code Status Order ID Comments User Context   11/06/2018 1742 11/06/2018 2253 Full Code [id number]  Herbert Pun, MD ED   Advance Care Planning Activity           Consults GI  DVT Prophylaxis  Lovenox  Lab Results  Component Value Date   PLT 405 (H) 02/06/2019     Time Spent in minutes 47min Greater than 50% of time spent in care coordination and counseling patient regarding the condition and plan of care.   Dustin Flock M.D on 02/06/2019 at 11:50 AM  Between 7am to 6pm - Pager - [phone]  After 6pm go to [email or web address] - Proofreader  [employer]   Office  [phone]

## 2019-02-06 NOTE — Consult Note (Signed)
[name], MD [address]  [place]  [place], [place] [address]  Main: [phone]  Fax: [phone] Pager: [phone]   Consultation  Referring Provider:     No ref. provider found Primary Care Physician:  Glean Hess, MD Primary Gastroenterologist:  Dr. Sherri Sear         Reason for Consultation:     C. difficile infection  Date of Admission:  02/05/2019 Date of Consultation:  02/06/2019         HPI:   Molly Cisneros is a 33 y.o. female with history of depression, anxiety, status post appendectomy in 10/2018 is admitted with C. difficile infection.  Patient reports that she was treated with UTI about 2 weeks ago.  For the last 1 week, she has been experiencing worsening of diarrhea mixed with blood, severe abdominal pain, nausea and vomiting, decreased appetite.  She went to urgent care as she was feeling very weak and lightheaded found to have significant leukocytosis as well as hypotensive.  On arrival, she had severe sepsis, blood pressure 67/52, heart rate 276, neutrophilic leukocytosis, WBC 17.1, platelets 523, hemoglobin 13.8, AKI and electrolyte abnormalities, normal LFTs, lipase, normal UA. CT of scan with PCR came back positive for C. difficile antigen but toxin was negative.  Given her worsening of diarrhea, leukocytosis after recent UTI that was treated with antibiotics, she was started on treatment for C. difficile infection.  Patient reports that she had 5 watery bowel movements this morning but did not see any blood.  She continues to have central abdominal pain.  She underwent CT abdomen and pelvis with contrast on admission which did not reveal acute intra-abdominal pathology.  Patient is known to me as outpatient for chronic diarrhea as well as significant weight loss.  Work-up has been negative thus far including EGD, colonoscopy, TSH, celiac disease, H. pylori.  Patient did not undergo MR enterography as recommended due to her insurance but she is  willing to undergo now.  I have empirically started her on amitriptyline as well as Bentyl prior to this episode for diarrhea predominant irritable bowel syndrome.  Patient reported that these medications controlled her symptoms although she was still having 5 runny bowel movements per day.  She was no longer experiencing nocturnal diarrhea or fecal incontinence and she was able to introduce some solid food.  Her weight was stable as well.  NSAIDs: None  Antiplts/Anticoagulants/Anti thrombotics: None  GI Procedures: EGD and colonoscopy 12/2018 Normal including pathology  Past Medical History:  Diagnosis Date   Anxiety    Hypertension    Shingles    reoccuring shingles on face    Past Surgical History:  Procedure Laterality Date   COLONOSCOPY WITH PROPOFOL N/A 12/23/2018   Procedure: COLONOSCOPY WITH PROPOFOL;  Surgeon: Lin Landsman, MD;  Location: [facility];  Service: Gastroenterology;  Laterality: N/A;   ESOPHAGOGASTRODUODENOSCOPY (EGD) WITH PROPOFOL N/A 12/23/2018   Procedure: ESOPHAGOGASTRODUODENOSCOPY (EGD) WITH PROPOFOL;  Surgeon: Lin Landsman, MD;  Location: [facility];  Service: Gastroenterology;  Laterality: N/A;   LAPAROSCOPIC APPENDECTOMY N/A 11/06/2018   Procedure: APPENDECTOMY LAPAROSCOPIC;  Surgeon: Herbert Pun, MD;  Location: [facility];  Service: General;  Laterality: N/A;    Prior to Admission medications   Medication Sig Start Date End Date Taking? Authorizing Provider  ALPRAZolam Duanne Moron) 1 MG tablet Take 1 mg by mouth at bedtime as needed for anxiety.    [provider]  amitriptyline (ELAVIL) 50 MG tablet Take  1 tablet (50 mg total) by mouth at bedtime. 11/24/18 01/23/19  Lin Landsman, MD  amphetamine-dextroamphetamine (ADDERALL XR) 30 MG 24 hr capsule  09/27/18   [provider]  amphetamine-dextroamphetamine (ADDERALL) 20 MG tablet Take 20 mg by mouth 2 (two) times a day.  10/26/18   [provider]   atorvastatin (LIPITOR) 20 MG tablet TAKE ONE TABLET BY MOUTH DAILY. 11/22/18   Juline Patch, MD  cyclobenzaprine (FLEXERIL) 10 MG tablet  08/27/18   [provider]  dicyclomine (BENTYL) 10 MG capsule TAKE (1) CAPSULE BY MOUTH FOUR TIMES A DAY BEFORE MEALS AND AT BEDTIME. 12/21/18   Irais Mottram, Tally Due, MD  DULoxetine (CYMBALTA) 60 MG capsule Take 60 mg by mouth daily.  07/18/15   [provider]  etonogestrel-ethinyl estradiol (NUVARING) 0.12-0.015 MG/24HR vaginal ring USE AS DIRECTED BY DOCTOR 11/02/17   Juline Patch, MD  gabapentin (NEURONTIN) 300 MG capsule TAKE (1) CAPSULE BY MOUTH TWICE DAILY 01/20/19   Glean Hess, MD  hydrOXYzine (VISTARIL) 25 MG capsule TAKE 1-2 CAPSULES BY MOUTH EVERY 6 HOURS. 01/20/19   Glean Hess, MD  lisinopril-hydrochlorothiazide (ZESTORETIC) 20-25 MG tablet TAKE (1) TABLET BY MOUTH EVERY DAY 11/03/18   Glean Hess, MD  ondansetron (ZOFRAN) 4 MG tablet TAKE (1) TABLET BY MOUTH TWICE DAILY 12/10/18   Glean Hess, MD  pantoprazole (PROTONIX) 20 MG tablet Take 20 mg by mouth 2 (two) times daily.    [provider]  pantoprazole (PROTONIX) 40 MG tablet  10/07/18   [provider]  promethazine (PHENERGAN) 25 MG tablet Take 1 tablet (25 mg total) by mouth at bedtime as needed for nausea or vomiting. 12/08/18   Juline Patch, MD  valACYclovir (VALTREX) 1000 MG tablet Take 1 tablet (1,000 mg total) by mouth 2 (two) times daily as needed. 10/26/18   Glean Hess, MD   Current Facility-Administered Medications:    0.9 %  sodium chloride infusion, , Intravenous, Continuous, Dustin Flock, MD, Last Rate: 100 mL/hr at 02/06/19 1053   acetaminophen (TYLENOL) tablet 650 mg, 650 mg, Oral, Q6H PRN **OR** acetaminophen (TYLENOL) suppository 650 mg, 650 mg, Rectal, Q6H PRN, Lance Coon, MD   ALPRAZolam Duanne Moron) tablet 1 mg, 1 mg, Oral, QHS PRN, Lance Coon, MD   amitriptyline (ELAVIL) tablet 75 mg, 75 mg, Oral, QHS,  Ladislao Cohenour, Tally Due, MD   atorvastatin (LIPITOR) tablet 20 mg, 20 mg, Oral, q1800, Lance Coon, MD   dicyclomine (BENTYL) tablet 20 mg, 20 mg, Oral, TID AC, Jamarco Zaldivar, Tally Due, MD   DULoxetine (CYMBALTA) DR capsule 60 mg, 60 mg, Oral, Daily, Lance Coon, MD, 60 mg at 02/06/19 0945   enoxaparin (LOVENOX) injection 40 mg, 40 mg, Subcutaneous, QHS, Lance Coon, MD, 40 mg at 02/05/19 2303   HYDROmorphone (DILAUDID) injection 0.5 mg, 0.5 mg, Intravenous, Q4H PRN, Lance Coon, MD, 0.5 mg at 02/06/19 1053   hydrOXYzine (ATARAX/VISTARIL) tablet 25 mg, 25 mg, Oral, QHS PRN, Lance Coon, MD   influenza vaccine adjuvanted (FLUAD) injection 0.5 mL, 0.5 mL, Intramuscular, Tomorrow-1000, Lance Coon, MD   ondansetron [facility]) tablet 4 mg, 4 mg, Oral, Q6H PRN **OR** ondansetron (ZOFRAN) injection 4 mg, 4 mg, Intravenous, Q6H PRN, Lance Coon, MD, 4 mg at 02/06/19 1053   oxyCODONE (Oxy IR/ROXICODONE) immediate release tablet 5 mg, 5 mg, Oral, Q4H PRN, Lance Coon, MD, 5 mg at 02/06/19 0917   pantoprazole (PROTONIX) EC tablet 40 mg, 40 mg, Oral, BID, Lance Coon, MD, 40 mg  at 02/06/19 0945   potassium chloride SA (K-DUR) CR tablet 40 mEq, 40 mEq, Oral, Q4H, Dustin Flock, MD   vancomycin (VANCOCIN) 50 mg/mL oral solution 125 mg, 125 mg, Oral, QID, Lance Coon, MD, 125 mg at 02/06/19 7425   Family History  Problem Relation Age of Onset   Non-Hodgkin's lymphoma Father 36       Basil Cell   Pancreatic cancer Maternal Grandmother 54   Thyroid cancer Maternal Grandmother 46   Throat cancer Paternal Grandfather 57     Social History   Tobacco Use   Smoking status: Never Smoker   Smokeless tobacco: Never Used  Substance Use Topics   Alcohol use: Yes   Drug use: No    Allergies as of 02/05/2019 - Review Complete 02/05/2019  Allergen Reaction Noted   Shellfish allergy Anaphylaxis 05/30/2015   Aloe vera Dermatitis 11/06/2018    Review of Systems:    All  systems reviewed and negative except where noted in HPI.   Physical Exam:  Vital signs in last 24 hours: Temp:  [97.7 F (36.5 C)-98.2 F (36.8 C)] 97.7 F (36.5 C) (09/20 1151) Pulse Rate:  [85-115] 104 (09/20 1151) Resp:  [13-24] 18 (09/20 0526) BP: (67-125)/(52-80) 106/69 (09/20 1151) SpO2:  [94 %-100 %] 98 % (09/20 1151) Weight:  [97.1 kg] 97.1 kg (09/19 1657) Last BM Date: 02/05/19 General:   Pleasant, cooperative in NAD Head:  Normocephalic and atraumatic. Eyes:   No icterus.   Conjunctiva pink. PERRLA. Ears:  Normal auditory acuity. Neck:  Supple; no masses or thyroidomegaly Lungs: Respirations even and unlabored. Lungs clear to auscultation bilaterally.   No wheezes, crackles, or rhonchi.  Heart:  Regular rate and rhythm;  Without murmur, clicks, rubs or gallops Abdomen:  Soft, nondistended, mild central abdominal tenderness. Normal bowel sounds. No appreciable masses or hepatomegaly.  No rebound or guarding.  Rectal:  Not performed. Msk:  Symmetrical without gross deformities.  Strength generalized weakness Extremities:  Without edema, cyanosis or clubbing. Neurologic:  Alert and oriented x3;  grossly normal neurologically. Skin:  Intact without significant lesions or rashes. Psych:  Alert and cooperative. Normal affect.  LAB RESULTS: CBC Latest Ref Rng & Units 02/06/2019 02/05/2019 02/05/2019  WBC 4.0 - 10.5 K/uL 11.2(H) - 17.2(H)  Hemoglobin 12.0 - 15.0 g/dL 11.7(L) 11.9(L) 12.6  Hematocrit 36.0 - 46.0 % 34.0(L) 34.3(L) 35.5(L)  Platelets 150 - 400 K/uL 405(H) - 460(H)    BMET BMP Latest Ref Rng & Units 02/06/2019 02/05/2019 02/05/2019  Glucose 70 - 99 mg/dL 99 109(H) 130(H)  BUN 6 - 20 mg/dL _0 Creatinine 0.44 - 1.00 mg/dL 0.97 1.65(H) 1.92(H)  BUN/Creat Ratio 9 - 23 - - -  Sodium 135 - 145 mmol/L 134(L) 128(L) 129(L)  Potassium 3.5 - 5.1 mmol/L 3.0(L) 2.6(LL) 2.6(LL)  Chloride 98 - 111 mmol/L 105 98 94(L)  CO2 22 - 32 mmol/L 21(L) 20(L) 21(L)  Calcium 8.9  - 10.3 mg/dL 8.3(L) 8.4(L) 9.3    LFT Hepatic Function Latest Ref Rng & Units 02/05/2019 02/05/2019 11/06/2018  Total Protein 6.5 - 8.1 g/dL 6.6 7.5 7.5  Albumin 3.5 - 5.0 g/dL 3.6 4.1 4.4  AST 15 - 41 U/L _1 ALT 0 - 44 U/L 28 32 23  Alk Phosphatase 38 - 126 U/L 61 71 56  Total Bilirubin 0.3 - 1.2 mg/dL 0.5 0.7 0.3  Bilirubin, Direct 0.0 - 0.2 mg/dL <0.1 - -     STUDIES: Ct Abdomen Pelvis  W Contrast  Result Date: 02/05/2019 CLINICAL DATA:  Abdominal pain, nausea, diarrhea, appendectomy 2 months ago EXAM: CT ABDOMEN AND PELVIS WITH CONTRAST TECHNIQUE: Multidetector CT imaging of the abdomen and pelvis was performed using the standard protocol following bolus administration of intravenous contrast. CONTRAST:  149m OMNIPAQUE IOHEXOL 300 MG/ML  SOLN COMPARISON:  11/06/2018 FINDINGS: Lower chest: No acute abnormality. Hepatobiliary: No solid liver abnormality is seen. No gallstones, gallbladder wall thickening, or biliary dilatation. Pancreas: Unremarkable. No pancreatic ductal dilatation or surrounding inflammatory changes. Spleen: Normal in size without significant abnormality. Adrenals/Urinary Tract: Adrenal glands are unremarkable. Kidneys are normal, without renal calculi, solid lesion, or hydronephrosis. Bladder is unremarkable. Stomach/Bowel: Stomach is within normal limits. Status post appendectomy. No evidence of bowel wall thickening, distention, or inflammatory changes. The colon is fluid-filled to the rectum. Vascular/Lymphatic: No significant vascular findings are present. No enlarged abdominal or pelvic lymph nodes. Reproductive: No mass or other significant abnormality. Multiple phleboliths in the pelvis. Other: No abdominal wall hernia or abnormality. No abdominopelvic ascites. Musculoskeletal: No acute or significant osseous findings. IMPRESSION: 1. The colon is fluid-filled to the rectum, in keeping with diarrheal illness. There are no inflammatory findings of the colon. 2. Status  post appendectomy without evidence of postoperative complication. Electronically Signed   By: AEddie CandleM.D.   On: 02/05/2019 19:00   Dg Chest Portable 1 View  Result Date: 02/05/2019 CLINICAL DATA:  Chest pain EXAM: PORTABLE CHEST 1 VIEW COMPARISON:  None. FINDINGS: The heart size and mediastinal contours are within normal limits. Both lungs are clear. The visualized skeletal structures are unremarkable. IMPRESSION: No acute cardiopulmonary process. Electronically Signed   By: BPrudencio PairM.D.   On: 02/05/2019 18:27      Impression / Plan:   AYVONNE STOPHERis a 33y.o. female with history of depression with history of chronic diarrhea thought to be secondary to diarrhea predominant IBS in setting of uncontrolled stress seen in consultation for C. difficile diarrhea.  Although the C. difficile toxin was negative, given her clinical presentation, agree to continue oral vancomycin 125 mg every 6 hours for 10 days.  Her leukocytosis improved in last 24 hours Recommend to check for other GI pathogens Monitor electrolytes closely and replete as needed Continue IV fluids Diet as tolerated Restart amitriptyline 75 mg at bedtime Bentyl as needed Further work-up of chronic diarrhea as outpatient after finishing treatment for C. difficile, will check immunoglobulin panel, a.m. cortisol levels, stool electrolytes and stool osmolality to evaluate for secretory diarrhea, MR enterography and may consider VCE Follow-up with me in 1 to 2 weeks after discharge  Thank you for involving me in the care of this patient.  Dr. TBonna Gainswill cover from tomorrow    LOS: 1 day   RSherri Sear MD  02/06/2019, 12:08 PM   Note: This dictation was prepared with Dragon dictation along with smaller phrase technology. Any transcriptional errors that result from this process are unintentional.

## 2019-02-07 LAB — MAGNESIUM: Magnesium: 1.9 mg/dL (ref 1.7–2.4)

## 2019-02-07 LAB — POTASSIUM: Potassium: 3.7 mmol/L (ref 3.5–5.1)

## 2019-02-07 LAB — BASIC METABOLIC PANEL
Anion gap: 7 (ref 5–15)
BUN: 9 mg/dL (ref 6–20)
CO2: 20 mmol/L — ABNORMAL LOW (ref 22–32)
Calcium: 8 mg/dL — ABNORMAL LOW (ref 8.9–10.3)
Chloride: 107 mmol/L (ref 98–111)
Creatinine, Ser: 0.92 mg/dL (ref 0.44–1.00)
GFR calc Af Amer: >60 mL/min (ref >60)
GFR calc non Af Amer: >60 mL/min (ref >60)
Glucose, Bld: 94 mg/dL (ref 70–99)
Potassium: 3 mmol/L — ABNORMAL LOW (ref 3.5–5.1)
Sodium: 134 mmol/L — ABNORMAL LOW (ref 135–145)

## 2019-02-07 MED ORDER — POTASSIUM CHLORIDE IN NACL 40-0.9 MEQ/L-% IV SOLN
INTRAVENOUS | Status: DC
  Administered 2019-02-07 – 2019-02-08 (×3): 100 mL/h via INTRAVENOUS
  Filled 2019-02-07 (×7): qty 1000

## 2019-02-07 MED ORDER — POTASSIUM CHLORIDE CRYS ER 20 MEQ PO TBCR
40.0000 meq | EXTENDED_RELEASE_TABLET | ORAL | Status: AC
  Administered 2019-02-07 (×2): 40 meq via ORAL
  Filled 2019-02-07 (×2): qty 2

## 2019-02-07 NOTE — Consult Note (Signed)
PHARMACY CONSULT NOTE - FOLLOW UP  Pharmacy Consult for Electrolyte Monitoring and Replacement   Recent Labs: Potassium (mmol/L)  Date Value  02/07/2019 3.0 (L)   Magnesium (mg/dL)  Date Value  02/07/2019 1.9   Calcium (mg/dL)  Date Value  02/07/2019 8.0 (L)   Albumin (g/dL)  Date Value  02/05/2019 3.6  10/15/2018 4.5   Sodium (mmol/L)  Date Value  02/07/2019 134 (L)  10/15/2018 136   Assessment: Pharmacy has been consulted to monitor/replenish electrolytes in this 33 yo female with chronic diarrhea being treated for C diff.  Goal of Therapy:  Electrolytes wnls  Plan:  Pt is receiving 2 po doses of 14meq KCl along with NS w 28meq KCL @ 100 ml/hr  Will recheck K @ 1800 per protocol and monitor/replenish as needed  Lu Duffel ,PharmD Clinical Pharmacist 02/07/2019 9:53 AM

## 2019-02-07 NOTE — Consult Note (Signed)
PHARMACY CONSULT NOTE - FOLLOW UP  Pharmacy Consult for Electrolyte Monitoring and Replacement   Recent Labs: Potassium (mmol/L)  Date Value  02/07/2019 3.7   Magnesium (mg/dL)  Date Value  02/07/2019 1.9   Calcium (mg/dL)  Date Value  02/07/2019 8.0 (L)   Albumin (g/dL)  Date Value  02/05/2019 3.6  10/15/2018 4.5   Sodium (mmol/L)  Date Value  02/07/2019 134 (L)  10/15/2018 136   Assessment: Pharmacy has been consulted to monitor/replenish electrolytes in this 33 yo female with chronic diarrhea being treated for C diff.  Goal of Therapy:  Electrolytes wnls  Plan:  9/21@1800 : K 3.7. Will defer further electrolyte replenishment at this time and will recheck levels with AM labs.  Pearla Dubonnet ,PharmD Clinical Pharmacist 02/07/2019 7:06 PM

## 2019-02-07 NOTE — Progress Notes (Signed)
Molly Antigua, MD [address] [place], [place], [place], [address] [address], [place], [place], [place], [address] Phone: [phone]  Fax: [phone]   Subjective: Patient continues to have diarrhea.  No blood in stool.  No fever or chills.  Has chronic diarrhea as well and follows with Dr. Marius Ditch   Objective: Exam: Vital signs in last 24 hours: Vitals:   02/06/19 1151 02/06/19 2033 02/07/19 0417 02/07/19 1308  BP: 106/69 114/77 102/70 106/69  Pulse: (!) 104 95 95 (!) 101  Resp:  16 17 20   Temp: 97.7 F (36.5 C) 98 F (36.7 C) 98.3 F (36.8 C) 97.7 F (36.5 C)  TempSrc: Oral Oral Oral Oral  SpO2: 98% 100% 97% 97%  Weight:      Height:       Weight change:   Intake/Output Summary (Last 24 hours) at 02/07/2019 1448 Last data filed at 02/07/2019 0800 Gross per 24 hour  Intake 2544.3 ml  Output 0 ml  Net 2544.3 ml    General: No acute distress, AAO x3 Abd: Soft, NT/ND, No HSM Skin: Warm, no rashes Neck: Supple, Trachea midline   Lab Results: Lab Results  Component Value Date   WBC 11.2 (H) 02/06/2019   HGB 11.7 (L) 02/06/2019   HCT 34.0 (L) 02/06/2019   MCV 84.8 02/06/2019   PLT 405 (H) 02/06/2019   Micro Results: Recent Results (from the past 240 hour(s))  C difficile quick scan w PCR reflex     Status: Abnormal   Collection Time: 02/05/19  4:58 PM   Specimen: STOOL  Result Value Ref Range Status   C Diff antigen POSITIVE (A) NEGATIVE Final   C Diff toxin NEGATIVE NEGATIVE Final   C Diff interpretation Results are indeterminate. See PCR results.  Final    Comment: Performed at [facility], [address] [place], [place] [address]  C. Diff by PCR, Reflexed     Status: Abnormal   Collection Time: 02/05/19  4:58 PM  Result Value Ref Range Status   Toxigenic C. Difficile by PCR POSITIVE (A) NEGATIVE Final    Comment: Positive for toxigenic C. difficile with little to no toxin production. Only treat if clinical presentation  suggests symptomatic illness. Performed at [facility], [address] [place], [place] [address]   Culture, blood (routine x 2)     Status: None (Preliminary result)   Collection Time: 02/05/19  5:09 PM   Specimen: BLOOD  Result Value Ref Range Status   Specimen Description BLOOD BLOOD LEFT HAND  Final   Special Requests   Final    BOTTLES DRAWN AEROBIC AND ANAEROBIC Blood Culture adequate volume   Culture   Final    NO GROWTH 2 DAYS Performed at [facility], [address] [place] [address] [place], [place] [address]    Report Status PENDING  Incomplete  Culture, blood (routine x 2)     Status: None (Preliminary result)   Collection Time: 02/05/19  5:09 PM   Specimen: BLOOD  Result Value Ref Range Status   Specimen Description BLOOD RIGHT ANTECUBITAL  Final   Special Requests   Final    BOTTLES DRAWN AEROBIC AND ANAEROBIC Blood Culture adequate volume   Culture   Final    NO GROWTH 2 DAYS Performed at [facility], [place], [place] [address]    Report Status PENDING  Incomplete  SARS CORONAVIRUS 2 (TAT 6-24 HRS) Nasopharyngeal Nasopharyngeal Swab     Status:  None   Collection Time: 02/05/19  8:52 PM   Specimen: Nasopharyngeal Swab  Result Value Ref Range Status   SARS Coronavirus 2 NEGATIVE NEGATIVE Final    Comment: (NOTE) SARS-CoV-2 target nucleic acids are NOT DETECTED. The SARS-CoV-2 RNA is generally detectable in upper and lower respiratory specimens during the acute phase of infection. Negative results do not preclude SARS-CoV-2 infection, do not rule out co-infections with other pathogens, and should not be used as the sole basis for treatment or other patient management decisions. Negative results must be combined with clinical observations, patient history, and epidemiological information. The expected result is Negative. Fact Sheet for Patients: SugarRoll.be Fact Sheet for Healthcare  Providers: https://www.woods-mathews.com/ This test is not yet approved or cleared by the [place] FDA and  has been authorized for detection and/or diagnosis of SARS-CoV-2 by FDA under an Emergency Use Authorization (EUA). This EUA will remain  in effect (meaning this test can be used) for the duration of the COVID-19 declaration under Section 56 4(b)(1) of the Act, 21 U.S.C. section 360bbb-3(b)(1), unless the authorization is terminated or revoked sooner. Performed at [facility], [place] [address] [place], [place] [address]    Studies/Results: Ct Abdomen Pelvis W Contrast  Result Date: 02/05/2019 CLINICAL DATA:  Abdominal pain, nausea, diarrhea, appendectomy 2 months ago EXAM: CT ABDOMEN AND PELVIS WITH CONTRAST TECHNIQUE: Multidetector CT imaging of the abdomen and pelvis was performed using the standard protocol following bolus administration of intravenous contrast. CONTRAST:  189mL OMNIPAQUE IOHEXOL 300 MG/ML  SOLN COMPARISON:  11/06/2018 FINDINGS: Lower chest: No acute abnormality. Hepatobiliary: No solid liver abnormality is seen. No gallstones, gallbladder wall thickening, or biliary dilatation. Pancreas: Unremarkable. No pancreatic ductal dilatation or surrounding inflammatory changes. Spleen: Normal in size without significant abnormality. Adrenals/Urinary Tract: Adrenal glands are unremarkable. Kidneys are normal, without renal calculi, solid lesion, or hydronephrosis. Bladder is unremarkable. Stomach/Bowel: Stomach is within normal limits. Status post appendectomy. No evidence of bowel wall thickening, distention, or inflammatory changes. The colon is fluid-filled to the rectum. Vascular/Lymphatic: No significant vascular findings are present. No enlarged abdominal or pelvic lymph nodes. Reproductive: No mass or other significant abnormality. Multiple phleboliths in the pelvis. Other: No abdominal wall hernia or abnormality. No abdominopelvic ascites.  Musculoskeletal: No acute or significant osseous findings. IMPRESSION: 1. The colon is fluid-filled to the rectum, in keeping with diarrheal illness. There are no inflammatory findings of the colon. 2. Status post appendectomy without evidence of postoperative complication. Electronically Signed   By: Eddie Candle M.D.   On: 02/05/2019 19:00   Dg Chest Portable 1 View  Result Date: 02/05/2019 CLINICAL DATA:  Chest pain EXAM: PORTABLE CHEST 1 VIEW COMPARISON:  None. FINDINGS: The heart size and mediastinal contours are within normal limits. Both lungs are clear. The visualized skeletal structures are unremarkable. IMPRESSION: No acute cardiopulmonary process. Electronically Signed   By: Prudencio Pair M.D.   On: 02/05/2019 18:27   Medications:  Scheduled Meds:  amitriptyline  75 mg Oral QHS   atorvastatin  20 mg Oral q1800   dicyclomine  20 mg Oral TID AC   DULoxetine  60 mg Oral Daily   enoxaparin (LOVENOX) injection  40 mg Subcutaneous QHS   pantoprazole  40 mg Oral BID   vancomycin  125 mg Oral QID   Continuous Infusions:  0.9 % NaCl with KCl 40 mEq / L 100 mL/hr (02/07/19 1000)   PRN Meds:.acetaminophen **OR** acetaminophen, ALPRAZolam, HYDROmorphone (DILAUDID) injection, hydrOXYzine, ondansetron **OR** ondansetron (ZOFRAN)  IV, oxyCODONE   Assessment: Principal Problem:   Severe sepsis (HCC) Active Problems:   Essential hypertension   Hyperlipidemia, mixed   C. difficile diarrhea   Hypokalemia   Hypomagnesemia    Plan: C. difficile antigen and PCR positive, toxin negative However, due to recent antibiotics use and her symptoms, patient is on oral vancomycin.    Dr. Marius Ditch plans on following up with the patient as an outpatient for further work-up of chronic diarrhea after treatment of C. difficile  GI panel pending   LOS: 2 days   Molly Antigua, MD 02/07/2019, 2:48 PM

## 2019-02-07 NOTE — Progress Notes (Signed)
[place] at [facility]                                                                                                                                                                                  Patient Demographics   Molly Cisneros, is a 33 y.o. female, DOB - 09-19-1985, ZC:[id number]  Admit date - 02/05/2019   Admitting Physician Lance Coon, MD  Outpatient Primary MD for the patient is Glean Hess, MD   LOS - 2  Subjective: Patient continues to have significant diarrhea.    Review of Systems:   CONSTITUTIONAL: No documented fever. No fatigue, weakness. No weight gain, no weight loss.  EYES: No blurry or double vision.  ENT: No tinnitus. No postnasal drip. No redness of the oropharynx.  RESPIRATORY: No cough, no wheeze, no hemoptysis. No dyspnea.  CARDIOVASCULAR: No chest pain. No orthopnea. No palpitations. No syncope.  GASTROINTESTINAL: No nausea, no vomiting or positive diarrhea. No abdominal pain. No melena or hematochezia.  GENITOURINARY: No dysuria or hematuria.  ENDOCRINE: No polyuria or nocturia. No heat or cold intolerance.  HEMATOLOGY: No anemia. No bruising. No bleeding.  INTEGUMENTARY: No rashes. No lesions.  MUSCULOSKELETAL: No arthritis. No swelling. No gout.  NEUROLOGIC: No numbness, tingling, or ataxia. No seizure-type activity.  PSYCHIATRIC: No anxiety. No insomnia. No ADD.    Vitals:   Vitals:   02/06/19 1151 02/06/19 2033 02/07/19 0417 02/07/19 1308  BP: 106/69 114/77 102/70 106/69  Pulse: (!) 104 95 95 (!) 101  Resp:  16 17 20   Temp: 97.7 F (36.5 C) 98 F (36.7 C) 98.3 F (36.8 C) 97.7 F (36.5 C)  TempSrc: Oral Oral Oral Oral  SpO2: 98% 100% 97% 97%  Weight:      Height:        Wt Readings from Last 3 Encounters:  02/05/19 97.1 kg  02/05/19 97.1 kg  12/23/18 97.1 kg     Intake/Output Summary (Last 24 hours) at 02/07/2019 1343 Last data filed at 02/07/2019 0800 Gross per 24 hour  Intake 2544.3 ml   Output 0 ml  Net 2544.3 ml    Physical Exam:   GENERAL: Pleasant-appearing in no apparent distress.  HEAD, EYES, EARS, NOSE AND THROAT: Atraumatic, normocephalic. Extraocular muscles are intact. Pupils equal and reactive to light. Sclerae anicteric. No conjunctival injection. No oro-pharyngeal erythema.  NECK: Supple. There is no jugular venous distention. No bruits, no lymphadenopathy, no thyromegaly.  HEART: Regular rate and rhythm,. No murmurs, no rubs, no clicks.  LUNGS: Clear to auscultation bilaterally. No rales or rhonchi. No wheezes.  ABDOMEN: Soft, flat, nontender, nondistended. Has good bowel sounds. No hepatosplenomegaly appreciated.  EXTREMITIES:  No evidence of any cyanosis, clubbing, or peripheral edema.  +2 pedal and radial pulses bilaterally.  NEUROLOGIC: The patient is alert, awake, and oriented x3 with no focal motor or sensory deficits appreciated bilaterally.  SKIN: Moist and warm with no rashes appreciated.  Psych: Not anxious, depressed LN: No inguinal LN enlargement    Antibiotics   Anti-infectives (From admission, onward)   Start     Dose/Rate Route Frequency Ordered Stop   02/05/19 2000  vancomycin (VANCOCIN) 50 mg/mL oral solution 125 mg     125 mg Oral 4 times daily 02/05/19 1907 02/15/19 1859      Medications   Scheduled Meds: . amitriptyline  75 mg Oral QHS  . atorvastatin  20 mg Oral q1800  . dicyclomine  20 mg Oral TID AC  . DULoxetine  60 mg Oral Daily  . enoxaparin (LOVENOX) injection  40 mg Subcutaneous QHS  . pantoprazole  40 mg Oral BID  . potassium chloride  40 mEq Oral Q4H  . vancomycin  125 mg Oral QID   Continuous Infusions: . 0.9 % NaCl with KCl 40 mEq / L 100 mL/hr (02/07/19 1000)   PRN Meds:.acetaminophen **OR** acetaminophen, ALPRAZolam, HYDROmorphone (DILAUDID) injection, hydrOXYzine, ondansetron **OR** ondansetron (ZOFRAN) IV, oxyCODONE   Data Review:   Micro Results Recent Results (from the past 240 hour(s))  C  difficile quick scan w PCR reflex     Status: Abnormal   Collection Time: 02/05/19  4:58 PM   Specimen: STOOL  Result Value Ref Range Status   C Diff antigen POSITIVE (A) NEGATIVE Final   C Diff toxin NEGATIVE NEGATIVE Final   C Diff interpretation Results are indeterminate. See PCR results.  Final    Comment: Performed at [facility], [address] [place], [place] [address]  C. Diff by PCR, Reflexed     Status: Abnormal   Collection Time: 02/05/19  4:58 PM  Result Value Ref Range Status   Toxigenic C. Difficile by PCR POSITIVE (A) NEGATIVE Final    Comment: Positive for toxigenic C. difficile with little to no toxin production. Only treat if clinical presentation suggests symptomatic illness. Performed at [facility], [address] [place], [place] [address]   Culture, blood (routine x 2)     Status: None (Preliminary result)   Collection Time: 02/05/19  5:09 PM   Specimen: BLOOD  Result Value Ref Range Status   Specimen Description BLOOD BLOOD LEFT HAND  Final   Special Requests   Final    BOTTLES DRAWN AEROBIC AND ANAEROBIC Blood Culture adequate volume   Culture   Final    NO GROWTH 2 DAYS Performed at [facility], [address] [place], [place] [address]    Report Status PENDING  Incomplete  Culture, blood (routine x 2)     Status: None (Preliminary result)   Collection Time: 02/05/19  5:09 PM   Specimen: BLOOD  Result Value Ref Range Status   Specimen Description BLOOD RIGHT ANTECUBITAL  Final   Special Requests   Final    BOTTLES DRAWN AEROBIC AND ANAEROBIC Blood Culture adequate volume   Culture   Final    NO GROWTH 2 DAYS Performed at [facility], [place], [place] [address]    Report Status PENDING  Incomplete  SARS CORONAVIRUS 2 (TAT 6-24 HRS) Nasopharyngeal Nasopharyngeal Swab     Status: None   Collection Time: 02/05/19  8:52 PM   Specimen: Nasopharyngeal Swab  Result  Value Ref Range Status    SARS Coronavirus 2 NEGATIVE NEGATIVE Final    Comment: (NOTE) SARS-CoV-2 target nucleic acids are NOT DETECTED. The SARS-CoV-2 RNA is generally detectable in upper and lower respiratory specimens during the acute phase of infection. Negative results do not preclude SARS-CoV-2 infection, do not rule out co-infections with other pathogens, and should not be used as the sole basis for treatment or other patient management decisions. Negative results must be combined with clinical observations, patient history, and epidemiological information. The expected result is Negative. Fact Sheet for Patients: SugarRoll.be Fact Sheet for Healthcare Providers: https://www.woods-mathews.com/ This test is not yet approved or cleared by the [place] FDA and  has been authorized for detection and/or diagnosis of SARS-CoV-2 by FDA under an Emergency Use Authorization (EUA). This EUA will remain  in effect (meaning this test can be used) for the duration of the COVID-19 declaration under Section 56 4(b)(1) of the Act, 21 U.S.C. section 360bbb-3(b)(1), unless the authorization is terminated or revoked sooner. Performed at [facility], [place] [address], [place], [place] [address]     Radiology Reports Ct Abdomen Pelvis W Contrast  Result Date: 02/05/2019 CLINICAL DATA:  Abdominal pain, nausea, diarrhea, appendectomy 2 months ago EXAM: CT ABDOMEN AND PELVIS WITH CONTRAST TECHNIQUE: Multidetector CT imaging of the abdomen and pelvis was performed using the standard protocol following bolus administration of intravenous contrast. CONTRAST:  167mL OMNIPAQUE IOHEXOL 300 MG/ML  SOLN COMPARISON:  11/06/2018 FINDINGS: Lower chest: No acute abnormality. Hepatobiliary: No solid liver abnormality is seen. No gallstones, gallbladder wall thickening, or biliary dilatation. Pancreas: Unremarkable. No pancreatic ductal dilatation or surrounding inflammatory changes. Spleen:  Normal in size without significant abnormality. Adrenals/Urinary Tract: Adrenal glands are unremarkable. Kidneys are normal, without renal calculi, solid lesion, or hydronephrosis. Bladder is unremarkable. Stomach/Bowel: Stomach is within normal limits. Status post appendectomy. No evidence of bowel wall thickening, distention, or inflammatory changes. The colon is fluid-filled to the rectum. Vascular/Lymphatic: No significant vascular findings are present. No enlarged abdominal or pelvic lymph nodes. Reproductive: No mass or other significant abnormality. Multiple phleboliths in the pelvis. Other: No abdominal wall hernia or abnormality. No abdominopelvic ascites. Musculoskeletal: No acute or significant osseous findings. IMPRESSION: 1. The colon is fluid-filled to the rectum, in keeping with diarrheal illness. There are no inflammatory findings of the colon. 2. Status post appendectomy without evidence of postoperative complication. Electronically Signed   By: Eddie Candle M.D.   On: 02/05/2019 19:00   Dg Chest Portable 1 View  Result Date: 02/05/2019 CLINICAL DATA:  Chest pain EXAM: PORTABLE CHEST 1 VIEW COMPARISON:  None. FINDINGS: The heart size and mediastinal contours are within normal limits. Both lungs are clear. The visualized skeletal structures are unremarkable. IMPRESSION: No acute cardiopulmonary process. Electronically Signed   By: Prudencio Pair M.D.   On: 02/05/2019 18:27     CBC Recent Labs  Lab 02/05/19 1541 02/05/19 1658 02/05/19 2113 02/06/19 0455  WBC 17.1* 17.2*  --  11.2*  HGB 13.8 12.6 11.9* 11.7*  HCT 39.1 35.5* 34.3* 34.0*  PLT 523* 460*  --  405*  MCV 83.2 83.1  --  84.8  MCH 29.4 29.5  --  29.2  MCHC 35.3 35.5  --  34.4  RDW 12.8 12.7  --  12.8  LYMPHSABS 2.9 3.0  --   --   MONOABS 0.8 1.3*  --   --   EOSABS 0.0 0.0  --   --   BASOSABS 0.1 0.1  --   --  Chemistries  Recent Labs  Lab 02/05/19 1541 02/05/19 1658 02/06/19 0455 02/07/19 0431  NA 129* 128*  134* 134*  K 2.6* 2.6* 3.0* 3.0*  CL 94* 98 105 107  CO2 21* 20* 21* 20*  GLUCOSE 130* 109* 99 94  BUN 13 13 10 9   CREATININE 1.92* 1.65* 0.97 0.92  CALCIUM 9.3 8.4* 8.3* 8.0*  MG  --  1.6*  --  1.9  AST 19 17  --   --   ALT 32 28  --   --   ALKPHOS 71 61  --   --   BILITOT 0.7 0.5  --   --    ------------------------------------------------------------------------------------------------------------------ estimated creatinine clearance is 111.5 mL/min (by C-G formula based on SCr of 0.92 mg/dL). ------------------------------------------------------------------------------------------------------------------ No results for input(s): HGBA1C in the last 72 hours. ------------------------------------------------------------------------------------------------------------------ No results for input(s): CHOL, HDL, LDLCALC, TRIG, CHOLHDL, LDLDIRECT in the last 72 hours. ------------------------------------------------------------------------------------------------------------------ No results for input(s): TSH, T4TOTAL, T3FREE, THYROIDAB in the last 72 hours.  Invalid input(s): FREET3 ------------------------------------------------------------------------------------------------------------------ No results for input(s): VITAMINB12, FOLATE, FERRITIN, TIBC, IRON, RETICCTPCT in the last 72 hours.  Coagulation profile No results for input(s): INR, PROTIME in the last 168 hours.  No results for input(s): DDIMER in the last 72 hours.  Cardiac Enzymes No results for input(s): CKMB, TROPONINI, MYOGLOBIN in the last 168 hours.  Invalid input(s): CK ------------------------------------------------------------------------------------------------------------------ Invalid input(s): [place]  Patient is 33 year old presenting with diarrhea noted to have C. difficile     1.  Severe sepsis ([place]) -due to C. difficile, patient still having diarrhea and still losing  significant amount amount of fluid Continue p.o. vancomycin Appreciate GI input  2.  Acute on chronic diarrhea as #1  3.   Hypokalemia -still low we will give her potassium check magnesium  4.   Hypomagnesemia -replace and monitor  5.   Essential hypertension blood pressure meds on hold  6.  Miscellaneous Lovenox for DVT prophylaxis    Code Status Orders  (From admission, onward)         Start     Ordered   02/05/19 2230  Full code  Continuous     02/05/19 2230        Code Status History    Date Active Date Inactive Code Status Order ID Comments User Context   11/06/2018 1742 11/06/2018 2253 Full Code [id number]  Herbert Pun, MD ED   Advance Care Planning Activity           Consults GI  DVT Prophylaxis  Lovenox  Lab Results  Component Value Date   PLT 405 (H) 02/06/2019     Time Spent in minutes 89min Greater than 50% of time spent in care coordination and counseling patient regarding the condition and plan of care.   Dustin Flock M.D on 02/07/2019 at 1:43 PM  Between 7am to 6pm - Pager - [phone]  After 6pm go to [email or web address] - Proofreader  [employer]   Office  [phone]

## 2019-02-08 ENCOUNTER — Encounter: Payer: Self-pay | Admitting: Gastroenterology

## 2019-02-08 LAB — BASIC METABOLIC PANEL
Anion gap: 5 (ref 5–15)
BUN: 9 mg/dL (ref 6–20)
CO2: 22 mmol/L (ref 22–32)
Calcium: 8.2 mg/dL — ABNORMAL LOW (ref 8.9–10.3)
Chloride: 110 mmol/L (ref 98–111)
Creatinine, Ser: 0.87 mg/dL (ref 0.44–1.00)
GFR calc Af Amer: >60 mL/min (ref >60)
GFR calc non Af Amer: >60 mL/min (ref >60)
Glucose, Bld: 100 mg/dL — ABNORMAL HIGH (ref 70–99)
Potassium: 3.7 mmol/L (ref 3.5–5.1)
Sodium: 137 mmol/L (ref 135–145)

## 2019-02-08 MED ORDER — ALPRAZOLAM 0.5 MG PO TABS: 1.0000 mg | ORAL_TABLET | Freq: Every evening | ORAL | Status: DC | PRN

## 2019-02-08 MED ORDER — ALPRAZOLAM 0.5 MG PO TABS: 1.0000 mg | ORAL_TABLET | Freq: Three times a day (TID) | ORAL | Status: DC | PRN

## 2019-02-08 NOTE — Progress Notes (Signed)
Molly Antigua, MD [address], [place], [place], [place], [address] [address], [place], [place], [place], [address] Phone: [phone]  Fax: [phone]   Subjective: Patient continues to have diarrhea but states it is improved today.  GI panel pending. Laying comfortably in bed sleeping when I walked in the room, with no distress   Objective: Exam: Vital signs in last 24 hours: Vitals:   02/07/19 0417 02/07/19 1308 02/07/19 1957 02/08/19 0426  BP: 102/70 106/69 98/67 100/69  Pulse: 95 (!) 101 (!) 103 99  Resp: 17 20 20 16   Temp: 98.3 F (36.8 C) 97.7 F (36.5 C) 98.4 F (36.9 C) 97.9 F (36.6 C)  TempSrc: Oral Oral Oral Oral  SpO2: 97% 97% 98% 96%  Weight:      Height:       Weight change:   Intake/Output Summary (Last 24 hours) at 02/08/2019 1027 Last data filed at 02/08/2019 0400 Gross per 24 hour  Intake 2151.68 ml  Output -  Net 2151.68 ml    General: No acute distress, AAO x3 Abd: Soft, NT/ND, No HSM Skin: Warm, no rashes Neck: Supple, Trachea midline   Lab Results: Lab Results  Component Value Date   WBC 11.2 (H) 02/06/2019   HGB 11.7 (L) 02/06/2019   HCT 34.0 (L) 02/06/2019   MCV 84.8 02/06/2019   PLT 405 (H) 02/06/2019   Micro Results: Recent Results (from the past 240 hour(s))  C difficile quick scan w PCR reflex     Status: Abnormal   Collection Time: 02/05/19  4:58 PM   Specimen: STOOL  Result Value Ref Range Status   C Diff antigen POSITIVE (A) NEGATIVE Final   C Diff toxin NEGATIVE NEGATIVE Final   C Diff interpretation Results are indeterminate. See PCR results.  Final    Comment: Performed at [facility], [address] [place], [place] [address]  C. Diff by PCR, Reflexed     Status: Abnormal   Collection Time: 02/05/19  4:58 PM  Result Value Ref Range Status   Toxigenic C. Difficile by PCR POSITIVE (A) NEGATIVE Final    Comment: Positive for toxigenic C. difficile with little to no toxin production. Only  treat if clinical presentation suggests symptomatic illness. Performed at [facility], [address] [place], [place] [address]   Culture, blood (routine x 2)     Status: None (Preliminary result)   Collection Time: 02/05/19  5:09 PM   Specimen: BLOOD  Result Value Ref Range Status   Specimen Description BLOOD BLOOD LEFT HAND  Final   Special Requests   Final    BOTTLES DRAWN AEROBIC AND ANAEROBIC Blood Culture adequate volume   Culture   Final    NO GROWTH 3 DAYS Performed at [facility], [address] [place], [place] [address]    Report Status PENDING  Incomplete  Culture, blood (routine x 2)     Status: None (Preliminary result)   Collection Time: 02/05/19  5:09 PM   Specimen: BLOOD  Result Value Ref Range Status   Specimen Description BLOOD RIGHT ANTECUBITAL  Final   Special Requests   Final    BOTTLES DRAWN AEROBIC AND ANAEROBIC Blood Culture adequate volume   Culture   Final    NO GROWTH 3 DAYS Performed at [facility] [place],  [address]    Report Status PENDING  Incomplete  SARS CORONAVIRUS 2 (TAT 6-24 HRS) Nasopharyngeal Nasopharyngeal Swab  Status: None   Collection Time: 02/05/19  8:52 PM   Specimen: Nasopharyngeal Swab  Result Value Ref Range Status   SARS Coronavirus 2 NEGATIVE NEGATIVE Final    Comment: (NOTE) SARS-CoV-2 target nucleic acids are NOT DETECTED. The SARS-CoV-2 RNA is generally detectable in upper and lower respiratory specimens during the acute phase of infection. Negative results do not preclude SARS-CoV-2 infection, do not rule out co-infections with other pathogens, and should not be used as the sole basis for treatment or other patient management decisions. Negative results must be combined with clinical observations, patient history, and epidemiological information. The expected result is Negative. Fact Sheet for Patients: SugarRoll.be Fact Sheet  for Healthcare Providers: https://www.woods-mathews.com/ This test is not yet approved or cleared by the [place] FDA and  has been authorized for detection and/or diagnosis of SARS-CoV-2 by FDA under an Emergency Use Authorization (EUA). This EUA will remain  in effect (meaning this test can be used) for the duration of the COVID-19 declaration under Section 56 4(b)(1) of the Act, 21 U.S.C. section 360bbb-3(b)(1), unless the authorization is terminated or revoked sooner. Performed at [facility], [place] [address], [place], [place] [address]    Studies/Results: No results found. Medications:  Scheduled Meds: . amitriptyline  75 mg Oral QHS  . atorvastatin  20 mg Oral q1800  . dicyclomine  20 mg Oral TID AC  . DULoxetine  60 mg Oral Daily  . enoxaparin (LOVENOX) injection  40 mg Subcutaneous QHS  . pantoprazole  40 mg Oral BID  . vancomycin  125 mg Oral QID   Continuous Infusions: . 0.9 % NaCl with KCl 40 mEq / L 100 mL/hr at 02/08/19 0400   PRN Meds:.acetaminophen **OR** acetaminophen, ALPRAZolam, HYDROmorphone (DILAUDID) injection, hydrOXYzine, ondansetron **OR** ondansetron (ZOFRAN) IV, oxyCODONE   Assessment: Principal Problem:   Severe sepsis ([place]) Active Problems:   Essential hypertension   Hyperlipidemia, mixed   C. difficile diarrhea   Hypokalemia   Hypomagnesemia    Plan: Electrolytes improved Follow-up pending GI panel Follow-up with Dr. Marius Ditch as an outpatient for work-up of chronic symptoms Avoid any exacerbating meds   LOS: 3 days   Molly Antigua, MD 02/08/2019, 10:27 AM

## 2019-02-08 NOTE — Consult Note (Signed)
PHARMACY CONSULT NOTE - FOLLOW UP  Pharmacy Consult for Electrolyte Monitoring and Replacement   Recent Labs: Potassium (mmol/L)  Date Value  02/08/2019 3.7   Magnesium (mg/dL)  Date Value  02/07/2019 1.9   Calcium (mg/dL)  Date Value  02/08/2019 8.2 (L)   Albumin (g/dL)  Date Value  02/05/2019 3.6  10/15/2018 4.5   Sodium (mmol/L)  Date Value  02/08/2019 137  10/15/2018 136   Assessment: Pharmacy has been consulted to monitor/replenish electrolytes in this 33 yo female with chronic diarrhea being treated for C diff.  Goal of Therapy:  Electrolytes wnls  Plan:  9/21@1800 : K 3.7.   Will continue IVF NS w KCl 50meq @ 131ml/hr and will recheck levels with AM labs.  Lu Duffel, PharmD, BCPS Clinical Pharmacist 02/08/2019 7:46 AM

## 2019-02-08 NOTE — Progress Notes (Signed)
[place] at [facility]                                                                                                                                                                                  Patient Demographics   Molly Cisneros, is a 33 y.o. female, DOB - [birth year], [id number]  Admit date - 02/05/2019   Admitting Physician Lance Coon, MD  Outpatient Primary MD for the patient is Glean Hess, MD   LOS - 3  Subjective: Pt still having diarrhea 5-6     Review of Systems:   CONSTITUTIONAL: No documented fever. No fatigue, weakness. No weight gain, no weight loss.  EYES: No blurry or double vision.  ENT: No tinnitus. No postnasal drip. No redness of the oropharynx.  RESPIRATORY: No cough, no wheeze, no hemoptysis. No dyspnea.  CARDIOVASCULAR: No chest pain. No orthopnea. No palpitations. No syncope.  GASTROINTESTINAL: No nausea, no vomiting or positive diarrhea. No abdominal pain. No melena or hematochezia.  GENITOURINARY: No dysuria or hematuria.  ENDOCRINE: No polyuria or nocturia. No heat or cold intolerance.  HEMATOLOGY: No anemia. No bruising. No bleeding.  INTEGUMENTARY: No rashes. No lesions.  MUSCULOSKELETAL: No arthritis. No swelling. No gout.  NEUROLOGIC: No numbness, tingling, or ataxia. No seizure-type activity.  PSYCHIATRIC: No anxiety. No insomnia. No ADD.    Vitals:   Vitals:   02/07/19 0417 02/07/19 1308 02/07/19 1957 02/08/19 0426  BP: 102/70 106/69 98/67 100/69  Pulse: 95 (!) 101 (!) 103 99  Resp: 17 20 20 16   Temp: 98.3 F (36.8 C) 97.7 F (36.5 C) 98.4 F (36.9 C) 97.9 F (36.6 C)  TempSrc: Oral Oral Oral Oral  SpO2: 97% 97% 98% 96%  Weight:      Height:        Wt Readings from Last 3 Encounters:  02/05/19 97.1 kg  02/05/19 97.1 kg  12/23/18 97.1 kg     Intake/Output Summary (Last 24 hours) at 02/08/2019 1412 Last data filed at 02/08/2019 0400 Gross per 24 hour  Intake 2151.68 ml  Output -  Net  2151.68 ml    Physical Exam:   GENERAL: Pleasant-appearing in no apparent distress.  HEAD, EYES, EARS, NOSE AND THROAT: Atraumatic, normocephalic. Extraocular muscles are intact. Pupils equal and reactive to light. Sclerae anicteric. No conjunctival injection. No oro-pharyngeal erythema.  NECK: Supple. There is no jugular venous distention. No bruits, no lymphadenopathy, no thyromegaly.  HEART: Regular rate and rhythm,. No murmurs, no rubs, no clicks.  LUNGS: Clear to auscultation bilaterally. No rales or rhonchi. No wheezes.  ABDOMEN: Soft, flat, nontender, nondistended. Has good bowel sounds. No hepatosplenomegaly appreciated.  EXTREMITIES: No  evidence of any cyanosis, clubbing, or peripheral edema.  +2 pedal and radial pulses bilaterally.  NEUROLOGIC: The patient is alert, awake, and oriented x3 with no focal motor or sensory deficits appreciated bilaterally.  SKIN: Moist and warm with no rashes appreciated.  Psych: Not anxious, depressed LN: No inguinal LN enlargement    Antibiotics   Anti-infectives (From admission, onward)   Start     Dose/Rate Route Frequency Ordered Stop   02/05/19 2000  vancomycin (VANCOCIN) 50 mg/mL oral solution 125 mg     125 mg Oral 4 times daily 02/05/19 1907 02/15/19 1859      Medications   Scheduled Meds: . amitriptyline  75 mg Oral QHS  . atorvastatin  20 mg Oral q1800  . dicyclomine  20 mg Oral TID AC  . DULoxetine  60 mg Oral Daily  . enoxaparin (LOVENOX) injection  40 mg Subcutaneous QHS  . pantoprazole  40 mg Oral BID  . vancomycin  125 mg Oral QID   Continuous Infusions: . 0.9 % NaCl with KCl 40 mEq / L 100 mL/hr at 02/08/19 0400   PRN Meds:.acetaminophen **OR** acetaminophen, ALPRAZolam, ALPRAZolam, HYDROmorphone (DILAUDID) injection, hydrOXYzine, ondansetron **OR** ondansetron (ZOFRAN) IV, oxyCODONE   Data Review:   Micro Results Recent Results (from the past 240 hour(s))  C difficile quick scan w PCR reflex     Status: Abnormal    Collection Time: 02/05/19  4:58 PM   Specimen: STOOL  Result Value Ref Range Status   C Diff antigen POSITIVE (A) NEGATIVE Final   C Diff toxin NEGATIVE NEGATIVE Final   C Diff interpretation Results are indeterminate. See PCR results.  Final    Comment: Performed at [facility], [address] [place], [place] [address]  C. Diff by PCR, Reflexed     Status: Abnormal   Collection Time: 02/05/19  4:58 PM  Result Value Ref Range Status   Toxigenic C. Difficile by PCR POSITIVE (A) NEGATIVE Final    Comment: Positive for toxigenic C. difficile with little to no toxin production. Only treat if clinical presentation suggests symptomatic illness. Performed at [facility], [address] [place], [place] [address]   Culture, blood (routine x 2)     Status: None (Preliminary result)   Collection Time: 02/05/19  5:09 PM   Specimen: BLOOD  Result Value Ref Range Status   Specimen Description BLOOD BLOOD LEFT HAND  Final   Special Requests   Final    BOTTLES DRAWN AEROBIC AND ANAEROBIC Blood Culture adequate volume   Culture   Final    NO GROWTH 3 DAYS Performed at [facility], [address] [place], [place] [address]    Report Status PENDING  Incomplete  Culture, blood (routine x 2)     Status: None (Preliminary result)   Collection Time: 02/05/19  5:09 PM   Specimen: BLOOD  Result Value Ref Range Status   Specimen Description BLOOD RIGHT ANTECUBITAL  Final   Special Requests   Final    BOTTLES DRAWN AEROBIC AND ANAEROBIC Blood Culture adequate volume   Culture   Final    NO GROWTH 3 DAYS Performed at [facility], [place], [place] [address]    Report Status PENDING  Incomplete  SARS CORONAVIRUS 2 (TAT 6-24 HRS) Nasopharyngeal Nasopharyngeal Swab     Status: None   Collection Time: 02/05/19  8:52 PM   Specimen: Nasopharyngeal Swab  Result Value Ref Range Status   SARS Coronavirus  2 NEGATIVE NEGATIVE Final    Comment:  (NOTE) SARS-CoV-2 target nucleic acids are NOT DETECTED. The SARS-CoV-2 RNA is generally detectable in upper and lower respiratory specimens during the acute phase of infection. Negative results do not preclude SARS-CoV-2 infection, do not rule out co-infections with other pathogens, and should not be used as the sole basis for treatment or other patient management decisions. Negative results must be combined with clinical observations, patient history, and epidemiological information. The expected result is Negative. Fact Sheet for Patients: SugarRoll.be Fact Sheet for Healthcare Providers: https://www.woods-mathews.com/ This test is not yet approved or cleared by the [place] FDA and  has been authorized for detection and/or diagnosis of SARS-CoV-2 by FDA under an Emergency Use Authorization (EUA). This EUA will remain  in effect (meaning this test can be used) for the duration of the COVID-19 declaration under Section 56 4(b)(1) of the Act, 21 U.S.C. section 360bbb-3(b)(1), unless the authorization is terminated or revoked sooner. Performed at [facility], [place] [address] [place], [place] [address]     Radiology Reports Ct Abdomen Pelvis W Contrast  Result Date: 02/05/2019 CLINICAL DATA:  Abdominal pain, nausea, diarrhea, appendectomy 2 months ago EXAM: CT ABDOMEN AND PELVIS WITH CONTRAST TECHNIQUE: Multidetector CT imaging of the abdomen and pelvis was performed using the standard protocol following bolus administration of intravenous contrast. CONTRAST:  152mL OMNIPAQUE IOHEXOL 300 MG/ML  SOLN COMPARISON:  11/06/2018 FINDINGS: Lower chest: No acute abnormality. Hepatobiliary: No solid liver abnormality is seen. No gallstones, gallbladder wall thickening, or biliary dilatation. Pancreas: Unremarkable. No pancreatic ductal dilatation or surrounding inflammatory changes. Spleen: Normal in size without significant abnormality.  Adrenals/Urinary Tract: Adrenal glands are unremarkable. Kidneys are normal, without renal calculi, solid lesion, or hydronephrosis. Bladder is unremarkable. Stomach/Bowel: Stomach is within normal limits. Status post appendectomy. No evidence of bowel wall thickening, distention, or inflammatory changes. The colon is fluid-filled to the rectum. Vascular/Lymphatic: No significant vascular findings are present. No enlarged abdominal or pelvic lymph nodes. Reproductive: No mass or other significant abnormality. Multiple phleboliths in the pelvis. Other: No abdominal wall hernia or abnormality. No abdominopelvic ascites. Musculoskeletal: No acute or significant osseous findings. IMPRESSION: 1. The colon is fluid-filled to the rectum, in keeping with diarrheal illness. There are no inflammatory findings of the colon. 2. Status post appendectomy without evidence of postoperative complication. Electronically Signed   By: Eddie Candle M.D.   On: 02/05/2019 19:00   Dg Chest Portable 1 View  Result Date: 02/05/2019 CLINICAL DATA:  Chest pain EXAM: PORTABLE CHEST 1 VIEW COMPARISON:  None. FINDINGS: The heart size and mediastinal contours are within normal limits. Both lungs are clear. The visualized skeletal structures are unremarkable. IMPRESSION: No acute cardiopulmonary process. Electronically Signed   By: Prudencio Pair M.D.   On: 02/05/2019 18:27     CBC Recent Labs  Lab 02/05/19 1541 02/05/19 1658 02/05/19 2113 02/06/19 0455  WBC 17.1* 17.2*  --  11.2*  HGB 13.8 12.6 11.9* 11.7*  HCT 39.1 35.5* 34.3* 34.0*  PLT 523* 460*  --  405*  MCV 83.2 83.1  --  84.8  MCH 29.4 29.5  --  29.2  MCHC 35.3 35.5  --  34.4  RDW 12.8 12.7  --  12.8  LYMPHSABS 2.9 3.0  --   --   MONOABS 0.8 1.3*  --   --   EOSABS 0.0 0.0  --   --   BASOSABS 0.1 0.1  --   --  Chemistries  Recent Labs  Lab 02/05/19 1541 02/05/19 1658 02/06/19 0455 02/07/19 0431 02/07/19 1800 02/08/19 0441  NA 129* 128* 134* 134*  --  137   K 2.6* 2.6* 3.0* 3.0* 3.7 3.7  CL 94* 98 105 107  --  110  CO2 21* 20* 21* 20*  --  22  GLUCOSE 130* 109* 99 94  --  100*  BUN 13 13 10 9   --  9  CREATININE 1.92* 1.65* 0.97 0.92  --  0.87  CALCIUM 9.3 8.4* 8.3* 8.0*  --  8.2*  MG  --  1.6*  --  1.9  --   --   AST 19 17  --   --   --   --   ALT 32 28  --   --   --   --   ALKPHOS 71 61  --   --   --   --   BILITOT 0.7 0.5  --   --   --   --    ------------------------------------------------------------------------------------------------------------------ estimated creatinine clearance is 117.9 mL/min (by C-G formula based on SCr of 0.87 mg/dL). ------------------------------------------------------------------------------------------------------------------ No results for input(s): HGBA1C in the last 72 hours. ------------------------------------------------------------------------------------------------------------------ No results for input(s): CHOL, HDL, LDLCALC, TRIG, CHOLHDL, LDLDIRECT in the last 72 hours. ------------------------------------------------------------------------------------------------------------------ No results for input(s): TSH, T4TOTAL, T3FREE, THYROIDAB in the last 72 hours.  Invalid input(s): FREET3 ------------------------------------------------------------------------------------------------------------------ No results for input(s): VITAMINB12, FOLATE, FERRITIN, TIBC, IRON, RETICCTPCT in the last 72 hours.  Coagulation profile No results for input(s): INR, PROTIME in the last 168 hours.  No results for input(s): DDIMER in the last 72 hours.  Cardiac Enzymes No results for input(s): CKMB, TROPONINI, MYOGLOBIN in the last 168 hours.  Invalid input(s): CK ------------------------------------------------------------------------------------------------------------------ Invalid input(s): Edgar  Patient is 33 year old presenting with diarrhea noted to have C.  difficile     1.  Severe sepsis ([place]) -due to C. difficile, patient still having diarrhea and still losing significant amount amount of fluid Continue p.o. vancomycin Appreciate GI input Would like diarrhea to improve a little bit prior to discharge to home  2.  Acute on chronic diarrhea as #1  3.   Hypokalemia -status post replaced  4.   Hypomagnesemia -replaced  5.   Essential hypertension blood pressure meds on hold  6.  Miscellaneous Lovenox for DVT prophylaxis    Code Status Orders  (From admission, onward)         Start     Ordered   02/05/19 2230  Full code  Continuous     02/05/19 2230        Code Status History    Date Active Date Inactive Code Status Order ID Comments User Context   11/06/2018 1742 11/06/2018 2253 Full Code [id number]  Herbert Pun, MD ED   Advance Care Planning Activity           Consults GI  DVT Prophylaxis  Lovenox  Lab Results  Component Value Date   PLT 405 (H) 02/06/2019     Time Spent in minutes 19min Greater than 50% of time spent in care coordination and counseling patient regarding the condition and plan of care.   Dustin Flock M.D on 02/08/2019 at 2:12 PM  Between 7am to 6pm - Pager - [phone]  After 6pm go to [email or web address] - Proofreader  [employer]   Office  [phone]

## 2019-02-09 ENCOUNTER — Encounter: Payer: Self-pay | Admitting: Gastroenterology

## 2019-02-09 LAB — BASIC METABOLIC PANEL
Anion gap: 8 (ref 5–15)
BUN: 7 mg/dL (ref 6–20)
CO2: 23 mmol/L (ref 22–32)
Calcium: 8.5 mg/dL — ABNORMAL LOW (ref 8.9–10.3)
Chloride: 108 mmol/L (ref 98–111)
Creatinine, Ser: 0.73 mg/dL (ref 0.44–1.00)
GFR calc Af Amer: >60 mL/min (ref >60)
GFR calc non Af Amer: >60 mL/min (ref >60)
Glucose, Bld: 112 mg/dL — ABNORMAL HIGH (ref 70–99)
Potassium: 3.9 mmol/L (ref 3.5–5.1)
Sodium: 139 mmol/L (ref 135–145)

## 2019-02-09 LAB — MAGNESIUM: Magnesium: 1.7 mg/dL (ref 1.7–2.4)

## 2019-02-09 LAB — PHOSPHORUS: Phosphorus: 2.3 mg/dL — ABNORMAL LOW (ref 2.5–4.6)

## 2019-02-09 MED ORDER — VANCOMYCIN 50 MG/ML ORAL SOLUTION: 125.0000 mg | Freq: Four times a day (QID) | ORAL | 0 refills | Status: DC

## 2019-02-09 MED ORDER — POTASSIUM PHOSPHATE MONOBASIC 500 MG PO TABS
1000.0000 mg | ORAL_TABLET | ORAL | Status: DC
  Administered 2019-02-09: 10:00:00 1000 mg via ORAL
  Filled 2019-02-09 (×2): qty 2

## 2019-02-09 MED ORDER — ONDANSETRON HCL 4 MG PO TABS: 4.0000 mg | ORAL_TABLET | Freq: Three times a day (TID) | ORAL | 0 refills | Status: DC | PRN

## 2019-02-09 MED ORDER — OXYCODONE-ACETAMINOPHEN 5-325 MG PO TABS: 1.0000 | ORAL_TABLET | Freq: Three times a day (TID) | ORAL | 0 refills | Status: DC | PRN

## 2019-02-09 MED ORDER — MAGNESIUM SULFATE 2 GM/50ML IV SOLN
2.0000 g | Freq: Once | INTRAVENOUS | Status: AC
  Administered 2019-02-09: 2 g via INTRAVENOUS
  Filled 2019-02-09: qty 50

## 2019-02-09 MED ORDER — SODIUM CHLORIDE 0.9 % IV SOLN
INTRAVENOUS | Status: DC
  Administered 2019-02-09: 09:00:00 via INTRAVENOUS

## 2019-02-09 NOTE — Consult Note (Signed)
PHARMACY CONSULT NOTE - FOLLOW UP  Pharmacy Consult for Electrolyte Monitoring and Replacement   Recent Labs: Potassium (mmol/L)  Date Value  02/09/2019 3.9   Magnesium (mg/dL)  Date Value  02/09/2019 1.7   Calcium (mg/dL)  Date Value  02/09/2019 8.5 (L)   Albumin (g/dL)  Date Value  02/05/2019 3.6  10/15/2018 4.5   Phosphorus (mg/dL)  Date Value  02/09/2019 2.3 (L)   Sodium (mmol/L)  Date Value  02/09/2019 139  10/15/2018 136   Assessment: Pharmacy has been consulted to monitor/replenish electrolytes in this 33 yo female with chronic diarrhea being treated for C diff.  Goal of Therapy:  Electrolytes wnls  Plan:  Phos = 2.3 Will give 2 potassium phosphate tablets (total of 1000mg ) every 4 hours x 2 doses  Mg=1.7 Will give magnesium sulfate 2g IV x 1  K = 3.9 Will change IVF NS w KCl 47meq @ 119ml/hr to NS w/out potassium   Will recheck Mg, Phos, and Potassium with am labs  Lu Duffel, PharmD, BCPS Clinical Pharmacist 02/09/2019 7:21 AM

## 2019-02-09 NOTE — Discharge Summary (Signed)
[place] at [facility], 33 y.o., DOB 07/21/1985, MRN [id number]. Admission date: 02/05/2019 Discharge Date 02/09/2019 Primary MD Glean Hess, MD Admitting Physician Lance Coon, MD  Admission Diagnosis  Dehydration [E86.0] Hypokalemia [E87.6] Clostridium difficile colitis [A04.72] AKI (acute kidney injury) [facility]) [N17.9]  Discharge Diagnosis   Principal Problem: Sepsis due to C. difficile colitis Acute on chronic diarrhea Hypokalemia Hypomagnesemia Essential hypertension  Hospital Course  Molly Cisneros  is a 33 y.o. female who presents with chief complaint as above.  Patient presents the ED with a complaint of abdominal pain with worsening diarrhea.  She states that this has been progressive for the past week or so.  She does have a history of chronic diarrhea, and has been following in the outpatient setting with GI for work-up.  Patient was evaluated in the ED and had a C. difficile checked which showed C. difficile colitis.  She was admitted and started on oral vancomycin.  Her diarrhea is improved but persists.  There may be a component of chronic diarrhea as well.            Consults  GI  Significant Tests:  See full reports for all details    Ct Abdomen Pelvis W Contrast  Result Date: 02/05/2019 CLINICAL DATA:  Abdominal pain, nausea, diarrhea, appendectomy 2 months ago EXAM: CT ABDOMEN AND PELVIS WITH CONTRAST TECHNIQUE: Multidetector CT imaging of the abdomen and pelvis was performed using the standard protocol following bolus administration of intravenous contrast. CONTRAST:  15mL OMNIPAQUE IOHEXOL 300 MG/ML  SOLN COMPARISON:  11/06/2018 FINDINGS: Lower chest: No acute abnormality. Hepatobiliary: No solid liver abnormality is seen. No gallstones, gallbladder wall thickening, or biliary dilatation. Pancreas: Unremarkable. No pancreatic ductal dilatation or surrounding inflammatory changes. Spleen: Normal in size without  significant abnormality. Adrenals/Urinary Tract: Adrenal glands are unremarkable. Kidneys are normal, without renal calculi, solid lesion, or hydronephrosis. Bladder is unremarkable. Stomach/Bowel: Stomach is within normal limits. Status post appendectomy. No evidence of bowel wall thickening, distention, or inflammatory changes. The colon is fluid-filled to the rectum. Vascular/Lymphatic: No significant vascular findings are present. No enlarged abdominal or pelvic lymph nodes. Reproductive: No mass or other significant abnormality. Multiple phleboliths in the pelvis. Other: No abdominal wall hernia or abnormality. No abdominopelvic ascites. Musculoskeletal: No acute or significant osseous findings. IMPRESSION: 1. The colon is fluid-filled to the rectum, in keeping with diarrheal illness. There are no inflammatory findings of the colon. 2. Status post appendectomy without evidence of postoperative complication. Electronically Signed   By: Eddie Candle M.D.   On: 02/05/2019 19:00   Dg Chest Portable 1 View  Result Date: 02/05/2019 CLINICAL DATA:  Chest pain EXAM: PORTABLE CHEST 1 VIEW COMPARISON:  None. FINDINGS: The heart size and mediastinal contours are within normal limits. Both lungs are clear. The visualized skeletal structures are unremarkable. IMPRESSION: No acute cardiopulmonary process. Electronically Signed   By: Prudencio Pair M.D.   On: 02/05/2019 18:27       Today   Subjective:   Molly Cisneros patient's diarrhea improved  Objective:   Blood pressure 124/80, pulse (!) 118, temperature 98.9 F (37.2 C), temperature source Oral, resp. rate 20, height 5' 11.5" (1.816 m), weight 97.1 kg, last menstrual period 01/18/2019, SpO2 98 %.  .  Intake/Output Summary (Last 24 hours) at 02/09/2019 1349 Last data filed at 02/09/2019 0604 Gross per 24 hour  Intake 1257.77 ml  Output -  Net 1257.77 ml    Exam VITAL  SIGNS: Blood pressure 124/80, pulse (!) 118, temperature 98.9 F (37.2 C),  temperature source Oral, resp. rate 20, height 5' 11.5" (1.816 m), weight 97.1 kg, last menstrual period 01/18/2019, SpO2 98 %.  GENERAL:  33 y.o.-year-old patient lying in the bed with no acute distress.  EYES: Pupils equal, round, reactive to light and accommodation. No scleral icterus. Extraocular muscles intact.  HEENT: Head atraumatic, normocephalic. Oropharynx and nasopharynx clear.  NECK:  Supple, no jugular venous distention. No thyroid enlargement, no tenderness.  LUNGS: Normal breath sounds bilaterally, no wheezing, rales,rhonchi or crepitation. No use of accessory muscles of respiration.  CARDIOVASCULAR: S1, S2 normal. No murmurs, rubs, or gallops.  ABDOMEN: Soft, nontender, nondistended. Bowel sounds present. No organomegaly or mass.  EXTREMITIES: No pedal edema, cyanosis, or clubbing.  NEUROLOGIC: Cranial nerves II through XII are intact. Muscle strength 5/5 in all extremities. Sensation intact. Gait not checked.  PSYCHIATRIC: The patient is alert and oriented x 3.  SKIN: No obvious rash, lesion, or ulcer.   Data Review     CBC w Diff:  Lab Results  Component Value Date   WBC 11.2 (H) 02/06/2019   HGB 11.7 (L) 02/06/2019   HGB 13.3 10/15/2018   HCT 34.0 (L) 02/06/2019   HCT 39.5 10/15/2018   PLT 405 (H) 02/06/2019   PLT 452 (H) 10/15/2018   LYMPHOPCT 17 02/05/2019   MONOPCT 7 02/05/2019   EOSPCT 0 02/05/2019   BASOPCT 0 02/05/2019   CMP:  Lab Results  Component Value Date   NA 139 02/09/2019   NA 136 10/15/2018   K 3.9 02/09/2019   CL 108 02/09/2019   CO2 23 02/09/2019   BUN 7 02/09/2019   BUN 11 10/15/2018   CREATININE 0.73 02/09/2019   PROT 6.6 02/05/2019   PROT 6.8 10/15/2018   ALBUMIN 3.6 02/05/2019   ALBUMIN 4.5 10/15/2018   BILITOT 0.5 02/05/2019   BILITOT <0.2 10/15/2018   ALKPHOS 61 02/05/2019   AST 17 02/05/2019   ALT 28 02/05/2019  .  Micro Results Recent Results (from the past 240 hour(s))  C difficile quick scan w PCR reflex      Status: Abnormal   Collection Time: 02/05/19  4:58 PM   Specimen: STOOL  Result Value Ref Range Status   C Diff antigen POSITIVE (A) NEGATIVE Final   C Diff toxin NEGATIVE NEGATIVE Final   C Diff interpretation Results are indeterminate. See PCR results.  Final    Comment: Performed at [facility], [address] [place], [place] [address]  C. Diff by PCR, Reflexed     Status: Abnormal   Collection Time: 02/05/19  4:58 PM  Result Value Ref Range Status   Toxigenic C. Difficile by PCR POSITIVE (A) NEGATIVE Final    Comment: Positive for toxigenic C. difficile with little to no toxin production. Only treat if clinical presentation suggests symptomatic illness. Performed at [facility], [address] [place], [place] [address]   Culture, blood (routine x 2)     Status: None (Preliminary result)   Collection Time: 02/05/19  5:09 PM   Specimen: BLOOD  Result Value Ref Range Status   Specimen Description BLOOD BLOOD LEFT HAND  Final   Special Requests   Final    BOTTLES DRAWN AEROBIC AND ANAEROBIC Blood Culture adequate volume   Culture   Final    NO GROWTH 4 DAYS Performed at [facility], [address] [place], [place] [address]    Report Status  PENDING  Incomplete  Culture, blood (routine x 2)     Status: None (Preliminary result)   Collection Time: 02/05/19  5:09 PM   Specimen: BLOOD  Result Value Ref Range Status   Specimen Description BLOOD RIGHT ANTECUBITAL  Final   Special Requests   Final    BOTTLES DRAWN AEROBIC AND ANAEROBIC Blood Culture adequate volume   Culture   Final    NO GROWTH 4 DAYS Performed at [facility], [place], [place] [address]    Report Status PENDING  Incomplete  SARS CORONAVIRUS 2 (TAT 6-24 HRS) Nasopharyngeal Nasopharyngeal Swab     Status: None   Collection Time: 02/05/19  8:52 PM   Specimen: Nasopharyngeal Swab  Result Value Ref Range Status   SARS Coronavirus 2 NEGATIVE NEGATIVE  Final    Comment: (NOTE) SARS-CoV-2 target nucleic acids are NOT DETECTED. The SARS-CoV-2 RNA is generally detectable in upper and lower respiratory specimens during the acute phase of infection. Negative results do not preclude SARS-CoV-2 infection, do not rule out co-infections with other pathogens, and should not be used as the sole basis for treatment or other patient management decisions. Negative results must be combined with clinical observations, patient history, and epidemiological information. The expected result is Negative. Fact Sheet for Patients: SugarRoll.be Fact Sheet for Healthcare Providers: https://www.woods-mathews.com/ This test is not yet approved or cleared by the [place] FDA and  has been authorized for detection and/or diagnosis of SARS-CoV-2 by FDA under an Emergency Use Authorization (EUA). This EUA will remain  in effect (meaning this test can be used) for the duration of the COVID-19 declaration under Section 56 4(b)(1) of the Act, 21 U.S.C. section 360bbb-3(b)(1), unless the authorization is terminated or revoked sooner. Performed at [facility], [place] [address], [place], [place] [address]         Code Status Orders  (From admission, onward)         Start     Ordered   02/05/19 2230  Full code  Continuous     02/05/19 2230        Code Status History    Date Active Date Inactive Code Status Order ID Comments User Context   11/06/2018 1742 11/06/2018 2253 Full Code [id number]  Herbert Pun, MD ED   Advance Care Planning Activity          Follow-up Information    Glean Hess, MD. Go on 02/16/2019.   Specialty: Internal Medicine Why: 1:20pm appointment Contact information: [address] [place] [address] [phone]        Lin Landsman, MD. Go on 03/22/2019.   Specialty: Gastroenterology Why: hosp f/u 1015am appointment Contact  information: [place] [address] [phone]           Discharge Medications   Allergies as of 02/09/2019      Reactions   Shellfish Allergy Anaphylaxis   Aloe Vera Dermatitis      Medication List    STOP taking these medications   lisinopril-hydrochlorothiazide 20-25 MG tablet Commonly known as: ZESTORETIC     TAKE these medications   ALPRAZolam 1 MG tablet Commonly known as: XANAX Take 1 mg by mouth at bedtime as needed for anxiety.   amitriptyline 75 MG tablet Commonly known as: ELAVIL Take 75 mg by mouth at bedtime.   amphetamine-dextroamphetamine 30 MG 24 hr capsule Commonly known as: ADDERALL XR Take 30 mg by mouth daily.  amphetamine-dextroamphetamine 20 MG tablet Commonly known as: ADDERALL Take 20 mg by mouth 2 (two) times a day.   atorvastatin 20 MG tablet Commonly known as: LIPITOR TAKE ONE TABLET BY MOUTH DAILY.   cyclobenzaprine 10 MG tablet Commonly known as: FLEXERIL Take 10 mg by mouth at bedtime.   dicyclomine 10 MG capsule Commonly known as: BENTYL TAKE (1) CAPSULE BY MOUTH FOUR TIMES A DAY BEFORE MEALS AND AT BEDTIME. What changed: See the new instructions.   DULoxetine 60 MG capsule Commonly known as: CYMBALTA Take 60 mg by mouth daily.   etonogestrel-ethinyl estradiol 0.12-0.015 MG/24HR vaginal ring Commonly known as: NuvaRing USE AS DIRECTED BY DOCTOR   gabapentin 300 MG capsule Commonly known as: NEURONTIN TAKE (1) CAPSULE BY MOUTH TWICE DAILY What changed: See the new instructions.   hydrOXYzine 25 MG capsule Commonly known as: VISTARIL TAKE 1-2 CAPSULES BY MOUTH EVERY 6 HOURS.   ondansetron 4 MG tablet Commonly known as: ZOFRAN Take 1 tablet (4 mg total) by mouth every 8 (eight) hours as needed for nausea or vomiting. What changed: See the new instructions.   oxyCODONE-acetaminophen 5-325 MG tablet Commonly known as: Percocet Take 1 tablet by mouth every 8 (eight) hours as needed for severe  pain.   pantoprazole 40 MG tablet Commonly known as: PROTONIX Take 40 mg by mouth 2 (two) times daily.   promethazine 25 MG tablet Commonly known as: PHENERGAN Take 1 tablet (25 mg total) by mouth at bedtime as needed for nausea or vomiting.   valACYclovir 1000 MG tablet Commonly known as: VALTREX Take 1 tablet (1,000 mg total) by mouth 2 (two) times daily as needed.   vancomycin 50 mg/mL  oral solution Commonly known as: VANCOCIN Take 2.5 mLs (125 mg total) by mouth 4 (four) times daily for 6 days.          Total Time in preparing paper work, data evaluation and todays exam - 25 minutes  Dustin Flock M.D on 02/09/2019 at [place]  [phone]

## 2019-02-09 NOTE — TOC Transition Note (Signed)
Transition of Care [facility]) - CM/SW Discharge Note   Patient Details  Name: Molly Cisneros MRN: [id number] Date of Birth: 1985-12-22  Transition of Care [facility]) CM/SW Contact:  Beverly Sessions, RN Phone Number: 02/09/2019, 2:47 PM   Clinical Narrative:      Patient to discharge home today oral vanc.  Her pharmacy [facility] does not have the medication in stock, and would not be available until tomorrow. Per patient request [facility] [place].  They have the medication in stock, and co pay is $0 Patient is aware        Patient Goals and CMS Choice        Discharge Placement                       Discharge Plan and Services                                     Social Determinants of Health (SDOH) Interventions     Readmission Risk Interventions No flowsheet data found.

## 2019-02-09 NOTE — Progress Notes (Signed)
Vonda Antigua, MD [address] [place], [place], [place], [address] [address], [place], [place], [place], [address] Phone: [phone]  Fax: [phone]   Subjective:  Patient reports overall symptom improvement today.  Still has diarrhea, as this is chronic but acute diarrhea is improving.  No fever or chills.  Tolerating oral diet.  Objective: Exam: Vital signs in last 24 hours: Vitals:   02/07/19 1957 02/08/19 0426 02/08/19 2034 02/09/19 0505  BP: 98/67 100/69 (!) 98/59 108/78  Pulse: (!) 103 99 98 96  Resp: 20 16 20 20   Temp: 98.4 F (36.9 C) 97.9 F (36.6 C) 98 F (36.7 C) 98 F (36.7 C)  TempSrc: Oral Oral Oral Oral  SpO2: 98% 96% 98% 98%  Weight:      Height:       Weight change:   Intake/Output Summary (Last 24 hours) at 02/09/2019 1258 Last data filed at 02/09/2019 0604 Gross per 24 hour  Intake 1257.77 ml  Output -  Net 1257.77 ml    General: No acute distress, AAO x3 Abd: Soft, NT/ND, No HSM Skin: Warm, no rashes Neck: Supple, Trachea midline   Lab Results: Lab Results  Component Value Date   WBC 11.2 (H) 02/06/2019   HGB 11.7 (L) 02/06/2019   HCT 34.0 (L) 02/06/2019   MCV 84.8 02/06/2019   PLT 405 (H) 02/06/2019   Micro Results: Recent Results (from the past 240 hour(s))  C difficile quick scan w PCR reflex     Status: Abnormal   Collection Time: 02/05/19  4:58 PM   Specimen: STOOL  Result Value Ref Range Status   C Diff antigen POSITIVE (A) NEGATIVE Final   C Diff toxin NEGATIVE NEGATIVE Final   C Diff interpretation Results are indeterminate. See PCR results.  Final    Comment: Performed at [facility], [address] [place], [place] [address]  C. Diff by PCR, Reflexed     Status: Abnormal   Collection Time: 02/05/19  4:58 PM  Result Value Ref Range Status   Toxigenic C. Difficile by PCR POSITIVE (A) NEGATIVE Final    Comment: Positive for toxigenic C. difficile with little to no toxin production. Only treat  if clinical presentation suggests symptomatic illness. Performed at [facility], [address] [place], [place] [address]   Culture, blood (routine x 2)     Status: None (Preliminary result)   Collection Time: 02/05/19  5:09 PM   Specimen: BLOOD  Result Value Ref Range Status   Specimen Description BLOOD BLOOD LEFT HAND  Final   Special Requests   Final    BOTTLES DRAWN AEROBIC AND ANAEROBIC Blood Culture adequate volume   Culture   Final    NO GROWTH 4 DAYS Performed at [facility], [address], [place], [place] [address]    Report Status PENDING  Incomplete  Culture, blood (routine x 2)     Status: None (Preliminary result)   Collection Time: 02/05/19  5:09 PM   Specimen: BLOOD  Result Value Ref Range Status   Specimen Description BLOOD RIGHT ANTECUBITAL  Final   Special Requests   Final    BOTTLES DRAWN AEROBIC AND ANAEROBIC Blood Culture adequate volume   Culture   Final    NO GROWTH 4 DAYS Performed at [facility], [place], [place] [address]    Report Status PENDING  Incomplete  SARS CORONAVIRUS 2 (TAT 6-24 HRS) Nasopharyngeal Nasopharyngeal Swab  Status: None   Collection Time: 02/05/19  8:52 PM   Specimen: Nasopharyngeal Swab  Result Value Ref Range Status   SARS Coronavirus 2 NEGATIVE NEGATIVE Final    Comment: (NOTE) SARS-CoV-2 target nucleic acids are NOT DETECTED. The SARS-CoV-2 RNA is generally detectable in upper and lower respiratory specimens during the acute phase of infection. Negative results do not preclude SARS-CoV-2 infection, do not rule out co-infections with other pathogens, and should not be used as the sole basis for treatment or other patient management decisions. Negative results must be combined with clinical observations, patient history, and epidemiological information. The expected result is Negative. Fact Sheet for Patients: SugarRoll.be Fact Sheet for  Healthcare Providers: https://www.woods-mathews.com/ This test is not yet approved or cleared by the [place] FDA and  has been authorized for detection and/or diagnosis of SARS-CoV-2 by FDA under an Emergency Use Authorization (EUA). This EUA will remain  in effect (meaning this test can be used) for the duration of the COVID-19 declaration under Section 56 4(b)(1) of the Act, 21 U.S.C. section 360bbb-3(b)(1), unless the authorization is terminated or revoked sooner. Performed at [facility], [place] [address], [place], [place] [address]    Studies/Results: No results found. Medications:  Scheduled Meds: . amitriptyline  75 mg Oral QHS  . atorvastatin  20 mg Oral q1800  . dicyclomine  20 mg Oral TID AC  . DULoxetine  60 mg Oral Daily  . enoxaparin (LOVENOX) injection  40 mg Subcutaneous QHS  . pantoprazole  40 mg Oral BID  . potassium phosphate (monobasic)  1,000 mg Oral Q4H  . vancomycin  125 mg Oral QID   Continuous Infusions: . sodium chloride 100 mL/hr at 02/09/19 0830   PRN Meds:.acetaminophen **OR** acetaminophen, ALPRAZolam, ALPRAZolam, HYDROmorphone (DILAUDID) injection, hydrOXYzine, ondansetron **OR** ondansetron (ZOFRAN) IV, oxyCODONE   Assessment: Principal Problem:   Severe sepsis ([place]) Active Problems:   Essential hypertension   Hyperlipidemia, mixed   C. difficile diarrhea   Hypokalemia   Hypomagnesemia    Plan: Follow-up with Dr. Marius Ditch in clinic for work-up of chronic diarrhea Patient advised to avoid any exacerbating meds Symptoms overall improved   LOS: 4 days   Vonda Antigua, MD 02/09/2019, 12:58 PM

## 2019-02-10 LAB — GI PATHOGEN PANEL BY PCR, STOOL
Adenovirus F 40/41: NOT DETECTED
Astrovirus: NOT DETECTED
C difficile toxin A/B: DETECTED — AB
Campylobacter by PCR: NOT DETECTED
Cryptosporidium by PCR: NOT DETECTED
Cyclospora cayetanensis: NOT DETECTED
E coli (ETEC) LT/ST: NOT DETECTED
E coli (STEC): NOT DETECTED
Entamoeba histolytica: NOT DETECTED
Enteroaggregative E coli: NOT DETECTED
Enteropathogenic E coli: NOT DETECTED
G lamblia by PCR: NOT DETECTED
Norovirus GI/GII: NOT DETECTED
Plesiomonas shigelloides: NOT DETECTED
Rotavirus A by PCR: NOT DETECTED
Salmonella by PCR: NOT DETECTED
Sapovirus: NOT DETECTED
Shigella by PCR: NOT DETECTED
Vibrio cholerae: NOT DETECTED
Vibrio: NOT DETECTED
Yersinia enterocolitica: NOT DETECTED

## 2019-02-10 LAB — CULTURE, BLOOD (ROUTINE X 2)
Culture: NO GROWTH
Culture: NO GROWTH
Special Requests: ADEQUATE
Special Requests: ADEQUATE

## 2019-02-12 ENCOUNTER — Emergency Department: Payer: PRIVATE HEALTH INSURANCE

## 2019-02-12 ENCOUNTER — Encounter: Payer: Self-pay | Admitting: Emergency Medicine

## 2019-02-12 ENCOUNTER — Other Ambulatory Visit: Payer: Self-pay

## 2019-02-12 ENCOUNTER — Inpatient Hospital Stay
Admission: EM | Admit: 2019-02-12 | Discharge: 2019-02-18 | DRG: 372 | Disposition: A | Discharge status: Home / Self Care | Payer: PRIVATE HEALTH INSURANCE | Attending: Internal Medicine | Admitting: Internal Medicine

## 2019-02-12 DIAGNOSIS — A0471 Enterocolitis due to Clostridium difficile, recurrent: Principal | ICD-10-CM | POA: Diagnosis present

## 2019-02-12 DIAGNOSIS — K529 Noninfective gastroenteritis and colitis, unspecified: Secondary | ICD-10-CM

## 2019-02-12 DIAGNOSIS — R7989 Other specified abnormal findings of blood chemistry: Secondary | ICD-10-CM

## 2019-02-12 DIAGNOSIS — Z79899 Other long term (current) drug therapy: Secondary | ICD-10-CM

## 2019-02-12 DIAGNOSIS — E876 Hypokalemia: Secondary | ICD-10-CM | POA: Diagnosis present

## 2019-02-12 DIAGNOSIS — I10 Essential (primary) hypertension: Secondary | ICD-10-CM | POA: Diagnosis present

## 2019-02-12 DIAGNOSIS — Z20828 Contact with and (suspected) exposure to other viral communicable diseases: Secondary | ICD-10-CM | POA: Diagnosis present

## 2019-02-12 DIAGNOSIS — R634 Abnormal weight loss: Secondary | ICD-10-CM | POA: Diagnosis present

## 2019-02-12 DIAGNOSIS — A419 Sepsis, unspecified organism: Secondary | ICD-10-CM

## 2019-02-12 DIAGNOSIS — Z91048 Other nonmedicinal substance allergy status: Secondary | ICD-10-CM

## 2019-02-12 DIAGNOSIS — A0472 Enterocolitis due to Clostridium difficile, not specified as recurrent: Secondary | ICD-10-CM

## 2019-02-12 DIAGNOSIS — E871 Hypo-osmolality and hyponatremia: Secondary | ICD-10-CM | POA: Diagnosis present

## 2019-02-12 DIAGNOSIS — B029 Zoster without complications: Secondary | ICD-10-CM | POA: Diagnosis present

## 2019-02-12 DIAGNOSIS — E785 Hyperlipidemia, unspecified: Secondary | ICD-10-CM | POA: Diagnosis present

## 2019-02-12 DIAGNOSIS — Z6830 Body mass index (BMI) 30.0-30.9, adult: Secondary | ICD-10-CM

## 2019-02-12 DIAGNOSIS — Z79891 Long term (current) use of opiate analgesic: Secondary | ICD-10-CM

## 2019-02-12 DIAGNOSIS — D649 Anemia, unspecified: Secondary | ICD-10-CM | POA: Diagnosis present

## 2019-02-12 DIAGNOSIS — E861 Hypovolemia: Secondary | ICD-10-CM | POA: Diagnosis present

## 2019-02-12 DIAGNOSIS — F419 Anxiety disorder, unspecified: Secondary | ICD-10-CM | POA: Diagnosis present

## 2019-02-12 DIAGNOSIS — K580 Irritable bowel syndrome with diarrhea: Secondary | ICD-10-CM | POA: Diagnosis present

## 2019-02-12 DIAGNOSIS — Z91013 Allergy to seafood: Secondary | ICD-10-CM

## 2019-02-12 HISTORY — DX: Enterocolitis due to Clostridium difficile, not specified as recurrent: A04.72

## 2019-02-12 LAB — COMPREHENSIVE METABOLIC PANEL
ALT: 22 U/L (ref 0–44)
AST: 26 U/L (ref 15–41)
Albumin: 4 g/dL (ref 3.5–5.0)
Alkaline Phosphatase: 69 U/L (ref 38–126)
Anion gap: 19 — ABNORMAL HIGH (ref 5–15)
BUN: 9 mg/dL (ref 6–20)
CO2: 22 mmol/L (ref 22–32)
Calcium: 8.9 mg/dL (ref 8.9–10.3)
Chloride: 75 mmol/L — ABNORMAL LOW (ref 98–111)
Creatinine, Ser: 0.79 mg/dL (ref 0.44–1.00)
GFR calc Af Amer: >60 mL/min (ref >60)
GFR calc non Af Amer: >60 mL/min (ref >60)
Glucose, Bld: 132 mg/dL — ABNORMAL HIGH (ref 70–99)
Potassium: 2.2 mmol/L — CL (ref 3.5–5.1)
Sodium: 116 mmol/L — CL (ref 135–145)
Total Bilirubin: 0.8 mg/dL (ref 0.3–1.2)
Total Protein: 7.3 g/dL (ref 6.5–8.1)

## 2019-02-12 LAB — URINALYSIS, COMPLETE (UACMP) WITH MICROSCOPIC
Bilirubin Urine: NEGATIVE
Glucose, UA: NEGATIVE mg/dL
Hgb urine dipstick: NEGATIVE
Ketones, ur: 5 mg/dL — AB
Leukocytes,Ua: NEGATIVE
Nitrite: NEGATIVE
Protein, ur: NEGATIVE mg/dL
Specific Gravity, Urine: 1.014 (ref 1.005–1.030)
pH: 6 (ref 5.0–8.0)

## 2019-02-12 LAB — CBC WITH DIFFERENTIAL/PLATELET
Abs Immature Granulocytes: 0.09 10*3/uL — ABNORMAL HIGH (ref 0.00–0.07)
Basophils Absolute: 0 10*3/uL (ref 0.0–0.1)
Basophils Relative: 0 %
Eosinophils Absolute: 0.1 10*3/uL (ref 0.0–0.5)
Eosinophils Relative: 1 %
HCT: 36.1 % (ref 36.0–46.0)
Hemoglobin: 13.3 g/dL (ref 12.0–15.0)
Immature Granulocytes: 1 %
Lymphocytes Relative: 25 %
Lymphs Abs: 3.5 10*3/uL (ref 0.7–4.0)
MCH: 29.2 pg (ref 26.0–34.0)
MCHC: 36.8 g/dL — ABNORMAL HIGH (ref 30.0–36.0)
MCV: 79.2 fL — ABNORMAL LOW (ref 80.0–100.0)
Monocytes Absolute: 1.2 10*3/uL — ABNORMAL HIGH (ref 0.1–1.0)
Monocytes Relative: 9 %
Neutro Abs: 8.9 10*3/uL — ABNORMAL HIGH (ref 1.7–7.7)
Neutrophils Relative %: 64 %
Platelets: 471 10*3/uL — ABNORMAL HIGH (ref 150–400)
RBC: 4.56 MIL/uL (ref 3.87–5.11)
RDW: 11.4 % — ABNORMAL LOW (ref 11.5–15.5)
WBC: 13.8 10*3/uL — ABNORMAL HIGH (ref 4.0–10.5)
nRBC: 0 % (ref 0.0–0.2)

## 2019-02-12 LAB — LACTIC ACID, PLASMA: Lactic Acid, Venous: 3.3 mmol/L (ref 0.5–1.9)

## 2019-02-12 LAB — MAGNESIUM: Magnesium: 1.5 mg/dL — ABNORMAL LOW (ref 1.7–2.4)

## 2019-02-12 LAB — POCT PREGNANCY, URINE: Preg Test, Ur: NEGATIVE

## 2019-02-12 MED ORDER — PROMETHAZINE HCL 25 MG/ML IJ SOLN
25.0000 mg | Freq: Once | INTRAMUSCULAR | Status: AC
  Administered 2019-02-12: 22:00:00 25 mg via INTRAVENOUS
  Filled 2019-02-12: qty 1

## 2019-02-12 MED ORDER — SODIUM CHLORIDE 0.9 % IV BOLUS: 1000.0000 mL | Freq: Once | INTRAVENOUS | Status: DC

## 2019-02-12 MED ORDER — POTASSIUM CHLORIDE CRYS ER 20 MEQ PO TBCR
40.0000 meq | EXTENDED_RELEASE_TABLET | Freq: Once | ORAL | Status: AC
  Administered 2019-02-13: 40 meq via ORAL
  Filled 2019-02-12: qty 2

## 2019-02-12 MED ORDER — SODIUM CHLORIDE 0.9 % IV SOLN: Freq: Once | INTRAVENOUS | Status: DC

## 2019-02-12 MED ORDER — VANCOMYCIN HCL 500 MG IV SOLR
500.0000 mg | Freq: Once | Status: AC
  Administered 2019-02-13: 01:00:00 500 mg via RECTAL
  Filled 2019-02-12: qty 500

## 2019-02-12 MED ORDER — VANCOMYCIN 50 MG/ML ORAL SOLUTION
500.0000 mg | Freq: Four times a day (QID) | ORAL | Status: DC
  Administered 2019-02-13: 01:00:00 500 mg via ORAL
  Filled 2019-02-12 (×4): qty 10

## 2019-02-12 MED ORDER — IOHEXOL 300 MG/ML  SOLN
100.0000 mL | Freq: Once | INTRAMUSCULAR | Status: AC | PRN
  Administered 2019-02-12: 100 mL via INTRAVENOUS

## 2019-02-12 MED ORDER — METRONIDAZOLE IN NACL 5-0.79 MG/ML-% IV SOLN
500.0000 mg | Freq: Once | INTRAVENOUS | Status: AC
  Administered 2019-02-13: 01:00:00 500 mg via INTRAVENOUS
  Filled 2019-02-12: qty 100

## 2019-02-12 MED ORDER — SODIUM CHLORIDE 0.9 % IV BOLUS
1000.0000 mL | Freq: Once | INTRAVENOUS | Status: AC
  Administered 2019-02-12: 22:00:00 1000 mL via INTRAVENOUS

## 2019-02-12 MED ORDER — MORPHINE SULFATE (PF) 4 MG/ML IV SOLN
4.0000 mg | Freq: Once | INTRAVENOUS | Status: AC
  Administered 2019-02-12: 4 mg via INTRAVENOUS
  Filled 2019-02-12: qty 1

## 2019-02-12 MED ORDER — POTASSIUM CHLORIDE 10 MEQ/100ML IV SOLN
10.0000 meq | INTRAVENOUS | Status: AC
  Administered 2019-02-13 (×3): 10 meq via INTRAVENOUS
  Filled 2019-02-12 (×3): qty 100

## 2019-02-12 MED ORDER — MAGNESIUM SULFATE 2 GM/50ML IV SOLN
2.0000 g | Freq: Once | INTRAVENOUS | Status: AC
  Administered 2019-02-13: 01:00:00 2 g via INTRAVENOUS
  Filled 2019-02-12: qty 50

## 2019-02-12 NOTE — ED Notes (Signed)
Pt presents with nausea, pale, sweaty, diffuse abdominal pain, recent dx of C Diff, and hospital DC

## 2019-02-12 NOTE — ED Triage Notes (Signed)
Pt arrived with c/o continued N/V/D and abd pain; pt was discharged Wednesday or Thursday this week from here with known C-diff; pt says all of her symptoms have continued but she actually feels worse than when she was admitted; pt pale, clammy and jittery;

## 2019-02-12 NOTE — ED Notes (Signed)
Patient unhooked from medical equipment and assisted to the bathroom. Patient would like to sit on the side of the bed. Call bell placed within reach, and patient instructed to call when she was ready to settle back into bed. Family member in the room at this time

## 2019-02-12 NOTE — ED Notes (Signed)
CRITICAL LAB: POTASSIUM is 2.2, SODIUM is 116, [facility], Dr. Ellender Hose notified, orders received

## 2019-02-12 NOTE — ED Notes (Signed)
Called for family , brother, to come back, [phone], Cleda Clarks

## 2019-02-12 NOTE — ED Notes (Signed)
First nurse note: pt assisted out of car into wheelchair, very shaky, sweating. Brother states pt was recently discharged from hospital with abd pain and vomiting. Pt reports vomiting and abd pain today. Pt pale.

## 2019-02-12 NOTE — ED Provider Notes (Signed)
[facility] Emergency Department Provider Note  ____________________________________________   First MD Initiated Contact with Patient 02/12/19 2145     (approximate)  I have reviewed the triage vital signs and the nursing notes.   HISTORY  Chief Complaint Abdominal Pain, Emesis, and Diarrhea    HPI Molly Cisneros is a 33 y.o. female with past medical history of recent C. difficile colitis here with severe abdominal pain.  The patient states that since returning from the hospital, she has had persistent, intractable, nausea and vomiting.  She is also had persistent diarrhea.  She is been unable to eat or drink.  She states that over the last 24 hours, her nausea has worsened.  She is been shaking and in general fatigue.  She been unable to eat or drink.  She has been unable to keep her oral vancomycin down.  She subsequent presents for evaluation.  She reports diffuse, but primarily left lower quadrant abdominal pain associate with this.  No alleviating factors.        Past Medical History:  Diagnosis Date   Anxiety    Clostridium difficile diarrhea    Hypertension    Shingles    reoccuring shingles on face    Patient Active Problem List   Diagnosis Date Noted   Hyponatremia 02/13/2019   Severe sepsis ([place]) 02/05/2019   C. difficile diarrhea 02/05/2019   Hypokalemia 02/05/2019   Hypomagnesemia 02/05/2019   Chronic diarrhea of unknown origin    Loss of weight    Acute appendicitis with localized peritonitis 11/06/2018   Essential hypertension 11/03/2018   Hyperlipidemia, mixed 11/03/2018   Degeneration of C5-C6 intervertebral disc 08/31/2018   Herniation of left side of L4-L5 intervertebral disc 06/05/2017   Obesity (BMI 30-39.9) 06/05/2017   Chronic left-sided low back pain without sciatica 07/23/2015    Past Surgical History:  Procedure Laterality Date   COLONOSCOPY WITH PROPOFOL N/A 12/23/2018   Procedure: COLONOSCOPY WITH  PROPOFOL;  Surgeon: Lin Landsman, MD;  Location: [facility] ENDOSCOPY;  Service: Gastroenterology;  Laterality: N/A;   ESOPHAGOGASTRODUODENOSCOPY (EGD) WITH PROPOFOL N/A 12/23/2018   Procedure: ESOPHAGOGASTRODUODENOSCOPY (EGD) WITH PROPOFOL;  Surgeon: Lin Landsman, MD;  Location: [place];  Service: Gastroenterology;  Laterality: N/A;   LAPAROSCOPIC APPENDECTOMY N/A 11/06/2018   Procedure: APPENDECTOMY LAPAROSCOPIC;  Surgeon: Herbert Pun, MD;  Location: [facility] ORS;  Service: General;  Laterality: N/A;    Prior to Admission medications   Medication Sig Start Date End Date Taking? Authorizing Provider  ALPRAZolam Duanne Moron) 1 MG tablet Take 1 mg by mouth at bedtime as needed for anxiety.   Yes [provider]  amitriptyline (ELAVIL) 75 MG tablet Take 75 mg by mouth at bedtime.  01/03/19  Yes [provider]  amphetamine-dextroamphetamine (ADDERALL XR) 30 MG 24 hr capsule Take 30 mg by mouth daily.  09/27/18  Yes [provider]  amphetamine-dextroamphetamine (ADDERALL) 20 MG tablet Take 20 mg by mouth 2 (two) times a day.  10/26/18  Yes [provider]  atorvastatin (LIPITOR) 20 MG tablet TAKE ONE TABLET BY MOUTH DAILY. Patient taking differently: Take 20 mg by mouth daily.  11/22/18  Yes Juline Patch, MD  cyclobenzaprine (FLEXERIL) 10 MG tablet Take 10 mg by mouth at bedtime.  08/27/18  Yes [provider]  dicyclomine (BENTYL) 10 MG capsule TAKE (1) CAPSULE BY MOUTH FOUR TIMES A DAY BEFORE MEALS AND AT BEDTIME. Patient taking differently: Take 10 mg by mouth 4 (four) times daily.  12/21/18  Yes Vanga, Tally Due, MD  DULoxetine (CYMBALTA) 60 MG capsule Take 60 mg by mouth daily.  07/18/15  Yes [provider]  gabapentin (NEURONTIN) 300 MG capsule TAKE (1) CAPSULE BY MOUTH TWICE DAILY Patient taking differently: Take 300 mg by mouth 2 (two) times daily.  01/20/19  Yes Glean Hess, MD  hydrOXYzine (VISTARIL) 25 MG capsule TAKE  1-2 CAPSULES BY MOUTH EVERY 6 HOURS. 01/20/19  Yes Glean Hess, MD  ondansetron (ZOFRAN) 4 MG tablet Take 1 tablet (4 mg total) by mouth every 8 (eight) hours as needed for nausea or vomiting. 02/09/19  Yes Dustin Flock, MD  oxyCODONE-acetaminophen (PERCOCET) 5-325 MG tablet Take 1 tablet by mouth every 8 (eight) hours as needed for severe pain. 02/09/19 02/09/20 Yes Dustin Flock, MD  pantoprazole (PROTONIX) 40 MG tablet Take 40 mg by mouth 2 (two) times daily.   Yes [provider]  promethazine (PHENERGAN) 25 MG tablet Take 1 tablet (25 mg total) by mouth at bedtime as needed for nausea or vomiting. 12/08/18  Yes Juline Patch, MD  valACYclovir (VALTREX) 1000 MG tablet Take 1 tablet (1,000 mg total) by mouth 2 (two) times daily as needed. 10/26/18  Yes Glean Hess, MD  vancomycin (VANCOCIN) 50 mg/mL oral solution Take 2.5 mLs (125 mg total) by mouth 4 (four) times daily for 6 days. 02/09/19 02/15/19 Yes Dustin Flock, MD  etonogestrel-ethinyl estradiol (NUVARING) 0.12-0.015 MG/24HR vaginal ring USE AS DIRECTED BY DOCTOR 11/02/17   Juline Patch, MD    Allergies Shellfish allergy and Aloe vera  Family History  Problem Relation Age of Onset   Non-Hodgkin's lymphoma Father 33       Basil Cell   Pancreatic cancer Maternal Grandmother 54   Thyroid cancer Maternal Grandmother 46   Throat cancer Paternal Grandfather 30    Social History Social History   Tobacco Use   Smoking status: Never Smoker   Smokeless tobacco: Never Used  Substance Use Topics   Alcohol use: Yes   Drug use: No    Review of Systems  Review of Systems  Constitutional: Positive for fatigue. Negative for fever.  HENT: Negative for congestion and sore throat.   Eyes: Negative for visual disturbance.  Respiratory: Negative for cough and shortness of breath.   Cardiovascular: Negative for chest pain.  Gastrointestinal: Positive for abdominal pain, diarrhea, nausea and vomiting.   Genitourinary: Negative for flank pain.  Musculoskeletal: Negative for back pain and neck pain.  Skin: Negative for rash and wound.  Neurological: Positive for weakness and light-headedness.     ____________________________________________  PHYSICAL EXAM:      VITAL SIGNS: ED Triage Vitals  Enc Vitals Group     BP 02/12/19 2140 138/82     Pulse Rate 02/12/19 2140 (!) 101     Resp 02/12/19 2140 (!) 22     Temp 02/12/19 2140 97.7 F (36.5 C)     Temp Source 02/12/19 2140 Oral     SpO2 02/12/19 2140 99 %     Weight 02/12/19 2136 220 lb (99.8 kg)     Height 02/12/19 2136 5' 11.5" (1.816 m)     Head Circumference --      Peak Flow --      Pain Score 02/12/19 2135 9     Pain Loc --      Pain Edu? --      Excl. in [place]? --      Physical Exam Vitals signs and nursing note  reviewed.  Constitutional:      General: She is not in acute distress.    Appearance: She is well-developed.  HENT:     Head: Normocephalic and atraumatic.     Mouth/Throat:     Comments: Dry MM Eyes:     Conjunctiva/sclera: Conjunctivae normal.  Neck:     Musculoskeletal: Neck supple.  Cardiovascular:     Rate and Rhythm: Regular rhythm. Tachycardia present.     Heart sounds: Normal heart sounds. No murmur. No friction rub.  Pulmonary:     Effort: Pulmonary effort is normal. No respiratory distress.     Breath sounds: Normal breath sounds. No wheezing or rales.  Abdominal:     General: There is no distension.     Palpations: Abdomen is soft.     Tenderness: There is generalized abdominal tenderness. There is guarding. There is no rebound.  Skin:    General: Skin is warm.     Capillary Refill: Capillary refill takes less than 2 seconds.  Neurological:     Mental Status: She is alert and oriented to person, place, and time.     Motor: No abnormal muscle tone.       ____________________________________________   LABS (all labs ordered are listed, but only abnormal results are  displayed)  Labs Reviewed  CBC WITH DIFFERENTIAL/PLATELET - Abnormal; Notable for the following components:      Result Value   WBC 13.8 (*)    MCV 79.2 (*)    MCHC 36.8 (*)    RDW 11.4 (*)    Platelets 471 (*)    Neutro Abs 8.9 (*)    Monocytes Absolute 1.2 (*)    Abs Immature Granulocytes 0.09 (*)    All other components within normal limits  COMPREHENSIVE METABOLIC PANEL - Abnormal; Notable for the following components:   Sodium 116 (*)    Potassium 2.2 (*)    Chloride 75 (*)    Glucose, Bld 132 (*)    Anion gap 19 (*)    All other components within normal limits  LACTIC ACID, PLASMA - Abnormal; Notable for the following components:   Lactic Acid, Venous 3.3 (*)    All other components within normal limits  URINALYSIS, COMPLETE (UACMP) WITH MICROSCOPIC - Abnormal; Notable for the following components:   Color, Urine YELLOW (*)    APPearance HAZY (*)    Ketones, ur 5 (*)    Bacteria, UA RARE (*)    All other components within normal limits  MAGNESIUM - Abnormal; Notable for the following components:   Magnesium 1.5 (*)    All other components within normal limits  SARS CORONAVIRUS 2 (HOSPITAL ORDER, [place] [facility])  CULTURE, BLOOD (ROUTINE X 2)  CULTURE, BLOOD (ROUTINE X 2)  LACTIC ACID, PLASMA  BASIC METABOLIC PANEL  CBC  GI PATHOGEN PANEL BY PCR, STOOL  OSMOLALITY  OSMOLALITY, URINE  TSH  POC URINE PREG, ED  POCT PREGNANCY, URINE    ____________________________________________  EKG:  NSR, VR 92. QTc 554, intervals o/w normal. QT prolonged, no ST-t segment elevations or depressions. ________________________________________  RADIOLOGY All imaging, including plain films, CT scans, and ultrasounds, independently reviewed by me, and interpretations confirmed via formal radiology reads.  ED MD interpretation:   CT A/P: Enteritis, colon filled with mucosal hyperenhancement, no perf, no toxic megacolon  Official radiology report(s): Ct  Abdomen Pelvis W Contrast  Result Date: 02/13/2019 CLINICAL DATA:  Nausea, vomiting, abdominal pain, recent clostridium difficile colitis EXAM:  CT ABDOMEN AND PELVIS WITH CONTRAST TECHNIQUE: Multidetector CT imaging of the abdomen and pelvis was performed using the standard protocol following bolus administration of intravenous contrast. CONTRAST:  146mL OMNIPAQUE IOHEXOL 300 MG/ML  SOLN COMPARISON:  CT 02/05/2019 FINDINGS: Lower chest: Lung bases are clear. Normal heart size. No pericardial effusion. Hepatobiliary: No focal liver abnormality is seen. No gallstones, gallbladder wall thickening, or biliary dilatation. Pancreas: Unremarkable. No pancreatic ductal dilatation or surrounding inflammatory changes. Spleen: Normal in size without focal abnormality. Adrenals/Urinary Tract: Adrenal glands are unremarkable. Kidneys are normal, without renal calculi, focal lesion, or hydronephrosis. Urinary bladder is largely decompressed at the time of exam and therefore poorly evaluated by CT imaging. Stomach/Bowel: Mild thickening of a jejunal loop in the left lower quadrant with adjacent mesenteric hazy stranding (2/54). No other small bowel dilatation or wall thickening. No extraluminal gas or organized abscess or collection. The appendix is surgically absent. The colon is largely fluid-filled with some mucosal hyperenhancement. Portion of the hepatic flexure is interposed anterior to the liver. Vascular/Lymphatic: Few reactive mid mesenteric lymph nodes. The aorta is normal caliber. Reproductive: Circular lucency in the vaginal canal, likely contraceptive device. Normal uterus. No concerning adnexal lesions. Other: Small volume of low-attenuation free fluid in the pelvis, possibly physiologic versus reactive. No bowel containing hernia. No free abdominopelvic air Musculoskeletal: No acute osseous abnormality or suspicious osseous lesion. IMPRESSION: 1. Mild thickening of a jejunal loop in the left lower quadrant with  adjacent mesenteric hazy stranding, consistent with enteritis, which could be infectious or inflammatory. No evidence of perforation or abscess formation. 2. The colon is largely fluid-filled with some mucosal hyperenhancement, consistent with history of diarrhea. 3. Small volume of low-attenuation free fluid in the pelvis, possibly physiologic versus reactive. Electronically Signed   By: Lovena Le M.D.   On: 02/13/2019 00:14    ____________________________________________  PROCEDURES   Procedure(s) performed (including Critical Care):  .Critical Care Performed by: Duffy Bruce, MD Authorized by: Duffy Bruce, MD   Critical care provider statement:    Critical care time (minutes):  35   Critical care time was exclusive of:  Separately billable procedures and treating other patients and teaching time   Critical care was necessary to treat or prevent imminent or life-threatening deterioration of the following conditions:  Cardiac failure, circulatory failure, dehydration, metabolic crisis and sepsis   Critical care was time spent personally by me on the following activities:  Development of treatment plan with patient or surrogate, discussions with consultants, evaluation of patient's response to treatment, examination of patient, obtaining history from patient or surrogate, ordering and performing treatments and interventions, ordering and review of laboratory studies, ordering and review of radiographic studies, pulse oximetry, re-evaluation of patient's condition and review of old charts   I assumed direction of critical care for this patient from another provider in my specialty: no      ____________________________________________  INITIAL IMPRESSION / MDM / [place] / ED COURSE  As part of my medical decision making, I reviewed the following data within the electronic MEDICAL RECORD NUMBER Notes from prior ED visits and [place] Controlled Substance Database      *Tannah E  Dishong was evaluated in Emergency Department on 02/13/2019 for the symptoms described in the history of present illness. She was evaluated in the context of the global COVID-19 pandemic, which necessitated consideration that the patient might be at risk for infection with the SARS-CoV-2 virus that causes COVID-19. Institutional protocols and algorithms that pertain to  the evaluation of patients at risk for COVID-19 are in a state of rapid change based on information released by regulatory bodies including the CDC and federal and state organizations. These policies and algorithms were followed during the patient's care in the ED.  Some ED evaluations and interventions may be delayed as a result of limited staffing during the pandemic.*   Clinical Course as of Feb 12 134  Sun Sep 27, 990  [id number] 33 year old female here with ongoing nausea, vomiting, diarrhea in the setting of recent C. difficile colitis.  Suspect profound hypovolemic hyponatremia secondary to active colitis.  Patient appears dehydrated clinically.  Lactic acid elevated.  Patient given initial liter of fluid, but will be cautious to over fluid resuscitate in the setting of her market hyponatremia, will plan for admission.  Discussed with pharmacy, will start IV Flagyl in addition to p.o. and rectal vancomycin.   [CI]    Clinical Course User Index [CI] Duffy Bruce, MD    Medical Decision Making: 33 yo F here with n/v/d in setting of recent C. DIff colitis. Labs show marked hypovolemic hyponatremia - 1L NS given, will switch to LR at 1.5 maintenance to be cautious w/ overcorrection. D/w Pharmacy - PO and rectal Vacn with IV Flagyl ordered. No signs of surgical abnormality on CT. Admit to medicine.  ____________________________________________  FINAL CLINICAL IMPRESSION(S) / ED DIAGNOSES  Final diagnoses:  Enteritis  C. difficile colitis  Acute hyponatremia  Hypokalemia  Elevated lactic acid level  Sepsis, due to unspecified  organism, unspecified whether acute organ dysfunction present [facility])     MEDICATIONS GIVEN DURING THIS VISIT:  Medications  potassium chloride 10 mEq in 100 mL IVPB (10 mEq Intravenous New Bag/Given 02/13/19 0049)  magnesium sulfate IVPB 2 g 50 mL (2 g Intravenous New Bag/Given 02/13/19 0047)  metroNIDAZOLE (FLAGYL) IVPB 500 mg (500 mg Intravenous New Bag/Given 02/13/19 0121)  vancomycin (VANCOCIN) 50 mg/mL oral solution 500 mg (500 mg Oral Given 02/13/19 0125)  lactated ringers infusion ( Intravenous New Bag/Given 02/13/19 0124)  oxyCODONE-acetaminophen (PERCOCET/ROXICET) 5-325 MG per tablet 1 tablet (1 tablet Oral Given 02/13/19 0117)  vancomycin (VANCOCIN) 50 mg/mL oral solution 125 mg (has no administration in time range)  vancomycin (VANCOCIN) 50 mg/mL oral solution 125 mg (has no administration in time range)  atorvastatin (LIPITOR) tablet 20 mg (has no administration in time range)  ALPRAZolam (XANAX) tablet 1 mg (has no administration in time range)  DULoxetine (CYMBALTA) DR capsule 60 mg (has no administration in time range)  hydrOXYzine (ATARAX/VISTARIL) tablet 25 mg (has no administration in time range)  dicyclomine (BENTYL) capsule 10 mg (has no administration in time range)  pantoprazole (PROTONIX) EC tablet 40 mg (has no administration in time range)  cyclobenzaprine (FLEXERIL) tablet 10 mg (has no administration in time range)  gabapentin (NEURONTIN) capsule 300 mg (has no administration in time range)  promethazine (PHENERGAN) tablet 25 mg (has no administration in time range)  enoxaparin (LOVENOX) injection 40 mg (has no administration in time range)  0.9 % NaCl with KCl 40 mEq / L  infusion (has no administration in time range)  acetaminophen (TYLENOL) tablet 650 mg (has no administration in time range)    Or  acetaminophen (TYLENOL) suppository 650 mg (has no administration in time range)  traZODone (DESYREL) tablet 25 mg (has no administration in time range)  magnesium  hydroxide (MILK OF MAGNESIA) suspension 30 mL (has no administration in time range)  ondansetron (ZOFRAN) tablet 4 mg (has no administration in  time range)    Or  ondansetron (ZOFRAN) injection 4 mg (has no administration in time range)  metroNIDAZOLE (FLAGYL) IVPB 500 mg (has no administration in time range)  morphine 2 MG/ML injection 2 mg (has no administration in time range)  metoCLOPramide (REGLAN) injection 10 mg (has no administration in time range)  sodium chloride 0.9 % bolus 1,000 mL (0 mLs Intravenous Stopped 02/12/19 2329)  promethazine (PHENERGAN) injection 25 mg (25 mg Intravenous Given 02/12/19 2212)  morphine 4 MG/ML injection 4 mg (4 mg Intravenous Given 02/12/19 2216)  potassium chloride SA (K-DUR) CR tablet 40 mEq (40 mEq Oral Given 02/13/19 0022)  vancomycin (VANCOCIN) 500 mg in sodium chloride irrigation 0.9 % 100 mL ENEMA (500 mg Rectal Given 02/13/19 0127)  iohexol (OMNIPAQUE) 300 MG/ML solution 100 mL (100 mLs Intravenous Contrast Given 02/12/19 2348)     ED Discharge Orders    None       Note:  This document was prepared using Dragon voice recognition software and may include unintentional dictation errors.   Duffy Bruce, MD 02/13/19 [phone]

## 2019-02-13 DIAGNOSIS — D649 Anemia, unspecified: Secondary | ICD-10-CM | POA: Diagnosis present

## 2019-02-13 DIAGNOSIS — E785 Hyperlipidemia, unspecified: Secondary | ICD-10-CM | POA: Diagnosis present

## 2019-02-13 DIAGNOSIS — Z91048 Other nonmedicinal substance allergy status: Secondary | ICD-10-CM | POA: Diagnosis not present

## 2019-02-13 DIAGNOSIS — I10 Essential (primary) hypertension: Secondary | ICD-10-CM | POA: Diagnosis present

## 2019-02-13 DIAGNOSIS — Z79899 Other long term (current) drug therapy: Secondary | ICD-10-CM | POA: Diagnosis not present

## 2019-02-13 DIAGNOSIS — Z91013 Allergy to seafood: Secondary | ICD-10-CM | POA: Diagnosis not present

## 2019-02-13 DIAGNOSIS — Z20828 Contact with and (suspected) exposure to other viral communicable diseases: Secondary | ICD-10-CM | POA: Diagnosis present

## 2019-02-13 DIAGNOSIS — A0472 Enterocolitis due to Clostridium difficile, not specified as recurrent: Secondary | ICD-10-CM | POA: Diagnosis not present

## 2019-02-13 DIAGNOSIS — E871 Hypo-osmolality and hyponatremia: Secondary | ICD-10-CM | POA: Diagnosis present

## 2019-02-13 DIAGNOSIS — B029 Zoster without complications: Secondary | ICD-10-CM | POA: Diagnosis present

## 2019-02-13 DIAGNOSIS — Z6830 Body mass index (BMI) 30.0-30.9, adult: Secondary | ICD-10-CM | POA: Diagnosis not present

## 2019-02-13 DIAGNOSIS — K580 Irritable bowel syndrome with diarrhea: Secondary | ICD-10-CM | POA: Diagnosis present

## 2019-02-13 DIAGNOSIS — R634 Abnormal weight loss: Secondary | ICD-10-CM | POA: Diagnosis present

## 2019-02-13 DIAGNOSIS — E876 Hypokalemia: Secondary | ICD-10-CM | POA: Diagnosis present

## 2019-02-13 DIAGNOSIS — A0471 Enterocolitis due to Clostridium difficile, recurrent: Secondary | ICD-10-CM | POA: Diagnosis present

## 2019-02-13 DIAGNOSIS — F419 Anxiety disorder, unspecified: Secondary | ICD-10-CM | POA: Diagnosis present

## 2019-02-13 DIAGNOSIS — Z79891 Long term (current) use of opiate analgesic: Secondary | ICD-10-CM | POA: Diagnosis not present

## 2019-02-13 DIAGNOSIS — E861 Hypovolemia: Secondary | ICD-10-CM | POA: Diagnosis present

## 2019-02-13 LAB — CBC
HCT: 31.4 % — ABNORMAL LOW (ref 36.0–46.0)
Hemoglobin: 11.4 g/dL — ABNORMAL LOW (ref 12.0–15.0)
MCH: 28.9 pg (ref 26.0–34.0)
MCHC: 36.3 g/dL — ABNORMAL HIGH (ref 30.0–36.0)
MCV: 79.7 fL — ABNORMAL LOW (ref 80.0–100.0)
Platelets: 380 10*3/uL (ref 150–400)
RBC: 3.94 MIL/uL (ref 3.87–5.11)
RDW: 11.3 % — ABNORMAL LOW (ref 11.5–15.5)
WBC: 11.5 10*3/uL — ABNORMAL HIGH (ref 4.0–10.5)
nRBC: 0 % (ref 0.0–0.2)

## 2019-02-13 LAB — LACTIC ACID, PLASMA
Lactic Acid, Venous: 2.6 mmol/L (ref 0.5–1.9)
Lactic Acid, Venous: 1.9 mmol/L (ref 0.5–1.9)

## 2019-02-13 LAB — OSMOLALITY: Osmolality: 245 mOsm/kg — CL (ref 275–295)

## 2019-02-13 LAB — SARS CORONAVIRUS 2 BY RT PCR (HOSPITAL ORDER, PERFORMED IN ~~LOC~~ HOSPITAL LAB): SARS Coronavirus 2: NEGATIVE

## 2019-02-13 LAB — BASIC METABOLIC PANEL
Anion gap: 13 (ref 5–15)
BUN: 8 mg/dL (ref 6–20)
CO2: 24 mmol/L (ref 22–32)
Calcium: 8 mg/dL — ABNORMAL LOW (ref 8.9–10.3)
Chloride: 82 mmol/L — ABNORMAL LOW (ref 98–111)
Creatinine, Ser: 0.65 mg/dL (ref 0.44–1.00)
GFR calc Af Amer: >60 mL/min (ref >60)
GFR calc non Af Amer: >60 mL/min (ref >60)
Glucose, Bld: 113 mg/dL — ABNORMAL HIGH (ref 70–99)
Potassium: 3.1 mmol/L — ABNORMAL LOW (ref 3.5–5.1)
Sodium: 119 mmol/L — CL (ref 135–145)

## 2019-02-13 LAB — OSMOLALITY, URINE: Osmolality, Ur: 529 mOsm/kg (ref 300–900)

## 2019-02-13 LAB — MAGNESIUM: Magnesium: 2 mg/dL (ref 1.7–2.4)

## 2019-02-13 LAB — SODIUM: Sodium: 125 mmol/L — ABNORMAL LOW (ref 135–145)

## 2019-02-13 LAB — TSH: TSH: 3.858 u[IU]/mL (ref 0.350–4.500)

## 2019-02-13 MED ORDER — POTASSIUM CHLORIDE CRYS ER 20 MEQ PO TBCR
40.0000 meq | EXTENDED_RELEASE_TABLET | Freq: Once | ORAL | Status: AC
  Administered 2019-02-13: 08:00:00 40 meq via ORAL
  Filled 2019-02-13: qty 2

## 2019-02-13 MED ORDER — VANCOMYCIN 50 MG/ML ORAL SOLUTION: 125.0000 mg | Freq: Four times a day (QID) | ORAL | Status: DC

## 2019-02-13 MED ORDER — METOCLOPRAMIDE HCL 5 MG/ML IJ SOLN
10.0000 mg | Freq: Four times a day (QID) | INTRAMUSCULAR | Status: DC
  Administered 2019-02-13 – 2019-02-15 (×10): 10 mg via INTRAVENOUS
  Filled 2019-02-13 (×10): qty 2

## 2019-02-13 MED ORDER — MAGNESIUM HYDROXIDE 400 MG/5ML PO SUSP: 30.0000 mL | Freq: Every day | ORAL | Status: DC | PRN

## 2019-02-13 MED ORDER — HYDROXYZINE HCL 25 MG PO TABS: 25.0000 mg | ORAL_TABLET | Freq: Four times a day (QID) | ORAL | Status: DC | PRN

## 2019-02-13 MED ORDER — ONDANSETRON HCL 4 MG PO TABS: 4.0000 mg | ORAL_TABLET | Freq: Three times a day (TID) | ORAL | Status: DC | PRN

## 2019-02-13 MED ORDER — FAMOTIDINE IN NACL 20-0.9 MG/50ML-% IV SOLN
20.0000 mg | Freq: Two times a day (BID) | INTRAVENOUS | Status: DC
  Administered 2019-02-13 – 2019-02-14 (×3): 20 mg via INTRAVENOUS
  Filled 2019-02-13 (×3): qty 50

## 2019-02-13 MED ORDER — ALPRAZOLAM 0.5 MG PO TABS
1.0000 mg | ORAL_TABLET | Freq: Every evening | ORAL | Status: DC | PRN
  Administered 2019-02-13: 1 mg via ORAL
  Filled 2019-02-13 (×2): qty 2

## 2019-02-13 MED ORDER — CYCLOBENZAPRINE HCL 10 MG PO TABS
10.0000 mg | ORAL_TABLET | Freq: Every day | ORAL | Status: DC
  Filled 2019-02-13: qty 1

## 2019-02-13 MED ORDER — OXYCODONE-ACETAMINOPHEN 5-325 MG PO TABS
1.0000 | ORAL_TABLET | Freq: Three times a day (TID) | ORAL | Status: DC | PRN
  Administered 2019-02-13: 1 via ORAL
  Filled 2019-02-13: qty 1

## 2019-02-13 MED ORDER — PANTOPRAZOLE SODIUM 40 MG PO TBEC: 40.0000 mg | DELAYED_RELEASE_TABLET | Freq: Two times a day (BID) | ORAL | Status: DC

## 2019-02-13 MED ORDER — DICYCLOMINE HCL 10 MG PO CAPS
10.0000 mg | ORAL_CAPSULE | Freq: Four times a day (QID) | ORAL | Status: DC
  Administered 2019-02-13 – 2019-02-17 (×19): 10 mg via ORAL
  Filled 2019-02-13 (×24): qty 1

## 2019-02-13 MED ORDER — ONDANSETRON HCL 4 MG/2ML IJ SOLN
4.0000 mg | Freq: Four times a day (QID) | INTRAMUSCULAR | Status: DC | PRN
  Administered 2019-02-13 – 2019-02-18 (×11): 4 mg via INTRAVENOUS
  Filled 2019-02-13 (×11): qty 2

## 2019-02-13 MED ORDER — MORPHINE SULFATE (PF) 2 MG/ML IV SOLN: 2.0000 mg | INTRAVENOUS | Status: DC | PRN

## 2019-02-13 MED ORDER — POTASSIUM CHLORIDE IN NACL 40-0.9 MEQ/L-% IV SOLN
INTRAVENOUS | Status: DC
  Administered 2019-02-13 – 2019-02-14 (×4): 100 mL/h via INTRAVENOUS
  Filled 2019-02-13 (×3): qty 1000

## 2019-02-13 MED ORDER — ACETAMINOPHEN 325 MG PO TABS: 650.0000 mg | ORAL_TABLET | Freq: Four times a day (QID) | ORAL | Status: DC | PRN

## 2019-02-13 MED ORDER — ATORVASTATIN CALCIUM 20 MG PO TABS
20.0000 mg | ORAL_TABLET | Freq: Every day | ORAL | Status: DC
  Administered 2019-02-13 – 2019-02-17 (×5): 20 mg via ORAL
  Filled 2019-02-13 (×5): qty 1

## 2019-02-13 MED ORDER — VANCOMYCIN 50 MG/ML ORAL SOLUTION
125.0000 mg | Freq: Four times a day (QID) | ORAL | Status: DC
  Administered 2019-02-13 – 2019-02-15 (×9): 125 mg via ORAL
  Filled 2019-02-13 (×12): qty 2.5

## 2019-02-13 MED ORDER — ONDANSETRON HCL 4 MG PO TABS
4.0000 mg | ORAL_TABLET | Freq: Four times a day (QID) | ORAL | Status: DC | PRN
  Administered 2019-02-13 – 2019-02-17 (×3): 4 mg via ORAL
  Filled 2019-02-13 (×3): qty 1

## 2019-02-13 MED ORDER — ENOXAPARIN SODIUM 40 MG/0.4ML ~~LOC~~ SOLN
40.0000 mg | SUBCUTANEOUS | Status: DC
  Administered 2019-02-14 – 2019-02-17 (×5): 40 mg via SUBCUTANEOUS
  Filled 2019-02-13 (×5): qty 0.4

## 2019-02-13 MED ORDER — PROMETHAZINE HCL 25 MG PO TABS
25.0000 mg | ORAL_TABLET | Freq: Every evening | ORAL | Status: DC | PRN
  Administered 2019-02-13: 25 mg via ORAL
  Filled 2019-02-13: qty 1

## 2019-02-13 MED ORDER — TRAZODONE HCL 50 MG PO TABS
25.0000 mg | ORAL_TABLET | Freq: Every evening | ORAL | Status: DC | PRN
  Administered 2019-02-13 – 2019-02-17 (×2): 25 mg via ORAL
  Filled 2019-02-13 (×2): qty 1

## 2019-02-13 MED ORDER — MORPHINE SULFATE (PF) 2 MG/ML IV SOLN
2.0000 mg | INTRAVENOUS | Status: DC | PRN
  Administered 2019-02-13 – 2019-02-18 (×9): 2 mg via INTRAVENOUS
  Filled 2019-02-13 (×10): qty 1

## 2019-02-13 MED ORDER — GABAPENTIN 300 MG PO CAPS
300.0000 mg | ORAL_CAPSULE | Freq: Two times a day (BID) | ORAL | Status: DC
  Administered 2019-02-13 – 2019-02-17 (×10): 300 mg via ORAL
  Filled 2019-02-13 (×10): qty 1

## 2019-02-13 MED ORDER — OXYCODONE-ACETAMINOPHEN 5-325 MG PO TABS
1.0000 | ORAL_TABLET | ORAL | Status: DC | PRN
  Administered 2019-02-13 (×2): 1 via ORAL
  Administered 2019-02-14 – 2019-02-17 (×13): 2 via ORAL
  Filled 2019-02-13 (×9): qty 2
  Filled 2019-02-13 (×2): qty 1
  Filled 2019-02-13 (×4): qty 2

## 2019-02-13 MED ORDER — METRONIDAZOLE IN NACL 5-0.79 MG/ML-% IV SOLN
500.0000 mg | Freq: Three times a day (TID) | INTRAVENOUS | Status: DC
  Administered 2019-02-13 – 2019-02-14 (×4): 500 mg via INTRAVENOUS
  Filled 2019-02-13 (×6): qty 100

## 2019-02-13 MED ORDER — LACTATED RINGERS IV SOLN
INTRAVENOUS | Status: DC
  Administered 2019-02-13: 01:00:00 via INTRAVENOUS

## 2019-02-13 MED ORDER — POTASSIUM CHLORIDE IN NACL 40-0.9 MEQ/L-% IV SOLN
INTRAVENOUS | Status: DC
  Filled 2019-02-13 (×2): qty 1000

## 2019-02-13 MED ORDER — ACETAMINOPHEN 650 MG RE SUPP: 650.0000 mg | Freq: Four times a day (QID) | RECTAL | Status: DC | PRN

## 2019-02-13 MED ORDER — DULOXETINE HCL 30 MG PO CPEP
60.0000 mg | ORAL_CAPSULE | Freq: Every day | ORAL | Status: DC
  Administered 2019-02-13 – 2019-02-17 (×5): 60 mg via ORAL
  Filled 2019-02-13 (×5): qty 2

## 2019-02-13 NOTE — H&P (Signed)
[name] [facility] [place] NAME: Molly Cisneros    MR#:  [id number]  DATE OF BIRTH:  03-08-1986  DATE OF ADMISSION:  02/12/2019  PRIMARY CARE PHYSICIAN: Glean Hess, MD   REQUESTING/REFERRING PHYSICIAN: Duffy Bruce, MD  CHIEF COMPLAINT:   Chief Complaint  Patient presents with   Abdominal Pain   Emesis   Diarrhea    HISTORY OF PRESENT ILLNESS:  Molly Cisneros  is a 33 y.o. Caucasian female with a known history of hypertension, anxiety, herpes zoster and  recent C. difficile colitis for which she was admitted here from 9/19 till 9/23 and discharged on p.o. vancomycin which she has been taking.  She has been having recurrent nausea and vomiting unfortunately since Thursday and denies any bilious vomitus or hematemesis.  She also had associated watery diarrhea without melena or bright red bleeding per rectum and admits to left lower quadrant abdominal pain.  She denied any fever or chills however she has been diaphoretic.  She denies any cough or wheezing or dyspnea or recent sick exposure to COVID-19.  Upon presentation to the emergency room, heart rate was 109 with respiratory to 22 and otherwise normal vital signs.  Later blood pressure was up to 135/105.  Labs were remarkable for lactic acid of 3.3 and significant hyponatremia with sodium of 116 down from 139 on 9/23, potassium of 2.2 down from 3.9 then a chloride of 75 down from 108 and.  Magnesium level was 1.5 and CBC showed leukocytosis with WBC of 13.8 up from 11.9 with absolute neutrophils of 8.9.  Abdominal pelvic CT scan revealed findings suggestive of mild thickening of the jejunal loop in the left lower quadrant with adjacent mesenteric hazy stranding consistent with enteritis that could be infectious or inflammatory with no evidence for perforation or abscess.  The colon was more fluid-filled with some mucosal hyperenhancement consistent with history of diarrhea.  COVID-19 test is  currently pending.  The patient was given 125 mg p.o. vancomycin, 1 L bolus of IV normal saline, 25 mg of IV Phenergan, 40 mEq p.o. potassium chloride and 10 mEq IV, 4 mg of IV morphine sulfate and 500 g of IV Flagyl.  She will be admitted to a medically monitored bed for further evaluation and management. PAST MEDICAL HISTORY:   Past Medical History:  Diagnosis Date   Anxiety    Clostridium difficile diarrhea    Hypertension    Shingles    reoccuring shingles on face    PAST SURGICAL HISTORY:   Past Surgical History:  Procedure Laterality Date   COLONOSCOPY WITH PROPOFOL N/A 12/23/2018   Procedure: COLONOSCOPY WITH PROPOFOL;  Surgeon: Lin Landsman, MD;  Location: [facility] ENDOSCOPY;  Service: Gastroenterology;  Laterality: N/A;   ESOPHAGOGASTRODUODENOSCOPY (EGD) WITH PROPOFOL N/A 12/23/2018   Procedure: ESOPHAGOGASTRODUODENOSCOPY (EGD) WITH PROPOFOL;  Surgeon: Lin Landsman, MD;  Location: [facility];  Service: Gastroenterology;  Laterality: N/A;   LAPAROSCOPIC APPENDECTOMY N/A 11/06/2018   Procedure: APPENDECTOMY LAPAROSCOPIC;  Surgeon: Herbert Pun, MD;  Location: [facility] ORS;  Service: General;  Laterality: N/A;    SOCIAL HISTORY:   Social History   Tobacco Use   Smoking status: Never Smoker   Smokeless tobacco: Never Used  Substance Use Topics   Alcohol use: Yes    FAMILY HISTORY:   Family History  Problem Relation Age of Onset   Non-Hodgkin's lymphoma Father 62       Basil Cell   Pancreatic cancer Maternal Grandmother  69   Thyroid cancer Maternal Grandmother 20   Throat cancer Paternal Grandfather 45    DRUG ALLERGIES:   Allergies  Allergen Reactions   Shellfish Allergy Anaphylaxis   Aloe Vera Dermatitis    REVIEW OF SYSTEMS:   ROS As per history of present illness. All pertinent systems were reviewed above. Constitutional,  HEENT, cardiovascular, respiratory, GI, GU, musculoskeletal, neuro, psychiatric, endocrine,   integumentary and hematologic systems were reviewed and are otherwise  negative/unremarkable except for positive findings mentioned above in the HPI.   MEDICATIONS AT HOME:   Prior to Admission medications   Medication Sig Start Date End Date Taking? Authorizing Provider  ALPRAZolam Duanne Moron) 1 MG tablet Take 1 mg by mouth at bedtime as needed for anxiety.   Yes [provider]  amitriptyline (ELAVIL) 75 MG tablet Take 75 mg by mouth at bedtime.  01/03/19  Yes [provider]  amphetamine-dextroamphetamine (ADDERALL XR) 30 MG 24 hr capsule Take 30 mg by mouth daily.  09/27/18  Yes [provider]  amphetamine-dextroamphetamine (ADDERALL) 20 MG tablet Take 20 mg by mouth 2 (two) times a day.  10/26/18  Yes [provider]  atorvastatin (LIPITOR) 20 MG tablet TAKE ONE TABLET BY MOUTH DAILY. Patient taking differently: Take 20 mg by mouth daily.  11/22/18  Yes Juline Patch, MD  cyclobenzaprine (FLEXERIL) 10 MG tablet Take 10 mg by mouth at bedtime.  08/27/18  Yes [provider]  dicyclomine (BENTYL) 10 MG capsule TAKE (1) CAPSULE BY MOUTH FOUR TIMES A DAY BEFORE MEALS AND AT BEDTIME. Patient taking differently: Take 10 mg by mouth 4 (four) times daily.  12/21/18  Yes Vanga, Tally Due, MD  DULoxetine (CYMBALTA) 60 MG capsule Take 60 mg by mouth daily.  07/18/15  Yes [provider]  gabapentin (NEURONTIN) 300 MG capsule TAKE (1) CAPSULE BY MOUTH TWICE DAILY Patient taking differently: Take 300 mg by mouth 2 (two) times daily.  01/20/19  Yes Glean Hess, MD  hydrOXYzine (VISTARIL) 25 MG capsule TAKE 1-2 CAPSULES BY MOUTH EVERY 6 HOURS. 01/20/19  Yes Glean Hess, MD  ondansetron (ZOFRAN) 4 MG tablet Take 1 tablet (4 mg total) by mouth every 8 (eight) hours as needed for nausea or vomiting. 02/09/19  Yes Dustin Flock, MD  oxyCODONE-acetaminophen (PERCOCET) 5-325 MG tablet Take 1 tablet by mouth every 8 (eight) hours as needed for severe pain.  02/09/19 02/09/20 Yes Dustin Flock, MD  pantoprazole (PROTONIX) 40 MG tablet Take 40 mg by mouth 2 (two) times daily.   Yes [provider]  promethazine (PHENERGAN) 25 MG tablet Take 1 tablet (25 mg total) by mouth at bedtime as needed for nausea or vomiting. 12/08/18  Yes Juline Patch, MD  valACYclovir (VALTREX) 1000 MG tablet Take 1 tablet (1,000 mg total) by mouth 2 (two) times daily as needed. 10/26/18  Yes Glean Hess, MD  vancomycin (VANCOCIN) 50 mg/mL oral solution Take 2.5 mLs (125 mg total) by mouth 4 (four) times daily for 6 days. 02/09/19 02/15/19 Yes Dustin Flock, MD  etonogestrel-ethinyl estradiol (NUVARING) 0.12-0.015 MG/24HR vaginal ring USE AS DIRECTED BY DOCTOR 11/02/17   Juline Patch, MD      VITAL SIGNS:  Blood pressure 138/82, pulse (!) 101, temperature 97.7 F (36.5 C), temperature source Oral, resp. rate (!) 22, height 5' 11.5" (1.816 m), weight 99.8 kg, last menstrual period 01/18/2019, SpO2 99 %.  PHYSICAL EXAMINATION:  Physical Exam  GENERAL:  33 y.o.-year-old Caucasian female patient lying  in the bed with no acute distress.  EYES: Pupils equal, round, reactive to light and accommodation. No scleral icterus. Extraocular muscles intact.  HEENT: Head atraumatic, normocephalic. Oropharynx with dry mucous membrane and tongue and nasopharynx clear.  NECK:  Supple, no jugular venous distention. No thyroid enlargement, no tenderness.  LUNGS: Normal breath sounds bilaterally, no wheezing, rales,rhonchi or crepitation. No use of accessory muscles of respiration.  CARDIOVASCULAR: Regular rate and rhythm, S1, S2 normal. No murmurs, rubs, or gallops.  ABDOMEN: Soft with left lower quadrant tenderness without about tenderness guarding or rigidity.  Bowel sounds present. No organomegaly or mass.  EXTREMITIES: No pedal edema, cyanosis, or clubbing.  NEUROLOGIC: Cranial nerves II through XII are intact. Muscle strength 5/5 in all extremities. Sensation intact.  Gait not checked.  PSYCHIATRIC: The patient is alert and oriented x 3.  Normal affect and good eye contact. SKIN: No obvious rash, lesion, or ulcer.   LABORATORY PANEL:   CBC Recent Labs  Lab 02/12/19 2151  WBC 13.8*  HGB 13.3  HCT 36.1  PLT 471*   ------------------------------------------------------------------------------------------------------------------  Chemistries  Recent Labs  Lab 02/12/19 2151  NA 116*  K 2.2*  CL 75*  CO2 22  GLUCOSE 132*  BUN 9  CREATININE 0.79  CALCIUM 8.9  MG 1.5*  AST 26  ALT 22  ALKPHOS 69  BILITOT 0.8   ------------------------------------------------------------------------------------------------------------------  Cardiac Enzymes No results for input(s): TROPONINI in the last 168 hours. ------------------------------------------------------------------------------------------------------------------  RADIOLOGY:  Ct Abdomen Pelvis W Contrast  Result Date: 02/13/2019 CLINICAL DATA:  Nausea, vomiting, abdominal pain, recent clostridium difficile colitis EXAM: CT ABDOMEN AND PELVIS WITH CONTRAST TECHNIQUE: Multidetector CT imaging of the abdomen and pelvis was performed using the standard protocol following bolus administration of intravenous contrast. CONTRAST:  178mL OMNIPAQUE IOHEXOL 300 MG/ML  SOLN COMPARISON:  CT 02/05/2019 FINDINGS: Lower chest: Lung bases are clear. Normal heart size. No pericardial effusion. Hepatobiliary: No focal liver abnormality is seen. No gallstones, gallbladder wall thickening, or biliary dilatation. Pancreas: Unremarkable. No pancreatic ductal dilatation or surrounding inflammatory changes. Spleen: Normal in size without focal abnormality. Adrenals/Urinary Tract: Adrenal glands are unremarkable. Kidneys are normal, without renal calculi, focal lesion, or hydronephrosis. Urinary bladder is largely decompressed at the time of exam and therefore poorly evaluated by CT imaging. Stomach/Bowel: Mild thickening  of a jejunal loop in the left lower quadrant with adjacent mesenteric hazy stranding (2/54). No other small bowel dilatation or wall thickening. No extraluminal gas or organized abscess or collection. The appendix is surgically absent. The colon is largely fluid-filled with some mucosal hyperenhancement. Portion of the hepatic flexure is interposed anterior to the liver. Vascular/Lymphatic: Few reactive mid mesenteric lymph nodes. The aorta is normal caliber. Reproductive: Circular lucency in the vaginal canal, likely contraceptive device. Normal uterus. No concerning adnexal lesions. Other: Small volume of low-attenuation free fluid in the pelvis, possibly physiologic versus reactive. No bowel containing hernia. No free abdominopelvic air Musculoskeletal: No acute osseous abnormality or suspicious osseous lesion. IMPRESSION: 1. Mild thickening of a jejunal loop in the left lower quadrant with adjacent mesenteric hazy stranding, consistent with enteritis, which could be infectious or inflammatory. No evidence of perforation or abscess formation. 2. The colon is largely fluid-filled with some mucosal hyperenhancement, consistent with history of diarrhea. 3. Small volume of low-attenuation free fluid in the pelvis, possibly physiologic versus reactive. Electronically Signed   By: Lovena Le M.D.   On: 02/13/2019 00:14      IMPRESSION AND PLAN:   1.  Acute gastroenteritis with intractable nausea and vomiting as well as diarrhea. - Patient will be admitted to a medically monitored bed. - We will obtain stool studies. - Given recent C. difficile colitis we will continue p.o. vancomycin and IV Flagyl. - Pain management will be provided.  2.  Recent C. difficile colitis. -We will continue p.o. vancomycin and place her on IV Flagyl.    3.  Hyponatremia.  This is clearly hypovolemic. - We will place her on hydration with IV normal saline with added potassium chloride will follow her sodium  level. -Hyponatremia work-up will be obtained.  4.  Hypokalemia. -Potassium will aggressively be replaced.  5.  Hypomagnesemia. -Magnesium will be replaced.  6.  Dyslipidemia. - Statin therapy will be resumed  7.  DVT prophylaxis. -Subcutaneous Lovenox  All the records are reviewed and case discussed with ED provider. The plan of care was discussed in details with the patient (and family). I answered all questions. The patient agreed to proceed with the above mentioned plan. Further management will depend upon hospital course.   CODE STATUS: Full code  TOTAL TIME TAKING CARE OF THIS PATIENT: 50 minutes.    Christel Mormon M.D on 02/13/2019 at 12:34 AM  Pager - [phone]  After 6pm go to [email or web address] - Proofreader  [facility]  Office  [phone]  CC: Primary care physician; Glean Hess, MD   Note: This dictation was prepared with Dragon dictation along with smaller phrase technology. Any transcriptional errors that result from this process are unintentional.

## 2019-02-13 NOTE — Progress Notes (Addendum)
Patient admitted early this morning.  Seen and examined by me later in the morning.  H&P for further details.  Patient states she is feeling a little bit better this morning.  Her last bowel movement was around 2 AM.  She is endorsing some "crampy" left lower quadrant abdominal pain.  Any additional episodes of vomiting.  On exam, she is tired-appearing, but in no acute distress.  RRR.  Lungs clear to auscultation bilaterally.  Positive bowel sounds.+ Mild tenderness to palpation of the left lower quadrant, no rebound or guarding.  She also has some mild epigastric abdominal tenderness to palpation.  -Continue IV Flagyl for now, can likely discontinue if GI pathogen panel is unremarkable -Continue p.o. vancomycin for C. Difficile -Sodium is slowly improving- will continue IV fluids today -Recheck sodium this afternoon -Potassium remains low- will continue to replete -Patient as a microcytic anemia- will check anemia panel  Hyman Bible, MD

## 2019-02-13 NOTE — ED Notes (Signed)
ED TO INPATIENT HANDOFF REPORT  ED Nurse Name and Phone #: Ena Dawley [phone]  S Name/Age/Gender Molly Cisneros 33 y.o. female Room/Bed: ED01A/ED01A  Code Status   Code Status: Full Code  Home/SNF/Other Home Patient oriented to: self, place, time and situation Is this baseline? Yes   Triage Complete: Triage complete  Chief Complaint Abd Pain Vomiting  Triage Note Pt arrived with c/o continued N/V/D and abd pain; pt was discharged Wednesday or Thursday this week from here with known C-diff; pt says all of her symptoms have continued but she actually feels worse than when she was admitted; pt pale, clammy and jittery;    Allergies Allergies  Allergen Reactions  . Shellfish Allergy Anaphylaxis  . Aloe Vera Dermatitis    Level of Care/Admitting Diagnosis ED Disposition    ED Disposition Condition [facility]: [place] [[id number]]  Level of Care: Med-Surg [16]  Covid Evaluation: Asymptomatic Screening Protocol (No Symptoms)  Diagnosis: Hyponatremia [id number]  Admitting Physician: Christel Mormon [id number]  Attending Physician: Christel Mormon [id number]  Estimated length of stay: past midnight tomorrow  Certification:: I certify this patient will need inpatient services for at least 2 midnights  PT Class (Do Not Modify): Inpatient [101]  PT Acc Code (Do Not Modify): Private [1]       B Medical/Surgery History Past Medical History:  Diagnosis Date  . Anxiety   . Clostridium difficile diarrhea   . Hypertension   . Shingles    reoccuring shingles on face   Past Surgical History:  Procedure Laterality Date  . COLONOSCOPY WITH PROPOFOL N/A 12/23/2018   Procedure: COLONOSCOPY WITH PROPOFOL;  Surgeon: Lin Landsman, MD;  Location: [facility] ENDOSCOPY;  Service: Gastroenterology;  Laterality: N/A;  . ESOPHAGOGASTRODUODENOSCOPY (EGD) WITH PROPOFOL N/A 12/23/2018   Procedure: ESOPHAGOGASTRODUODENOSCOPY (EGD) WITH PROPOFOL;  Surgeon: Lin Landsman, MD;  Location: [facility] ENDOSCOPY;  Service: Gastroenterology;  Laterality: N/A;  . LAPAROSCOPIC APPENDECTOMY N/A 11/06/2018   Procedure: APPENDECTOMY LAPAROSCOPIC;  Surgeon: Herbert Pun, MD;  Location: [facility] ORS;  Service: General;  Laterality: N/A;     A IV Location/Drains/Wounds Patient Lines/Drains/Airways Status   Active Line/Drains/Airways    Name:   Placement date:   Placement time:   Site:   Days:   Peripheral IV 02/12/19 Right Hand   02/12/19    2150    Hand   1   Peripheral IV 02/13/19 Right Forearm   02/13/19    0030    Forearm   less than 1   Incision - 3 Ports Abdomen Umbilicus Left;Lower Left;Lower   11/06/18    2131     99          Intake/Output Last 24 hours  Intake/Output Summary (Last 24 hours) at 02/13/2019 0146 Last data filed at 02/12/2019 2329 Gross per 24 hour  Intake 1000 ml  Output -  Net 1000 ml    Labs/Imaging Results for orders placed or performed during the hospital encounter of 02/12/19 (from the past 48 hour(s))  CBC with Differential     Status: Abnormal   Collection Time: 02/12/19  9:51 PM  Result Value Ref Range   WBC 13.8 (H) 4.0 - 10.5 K/uL   RBC 4.56 3.87 - 5.11 MIL/uL   Hemoglobin 13.3 12.0 - 15.0 g/dL   HCT 36.1 36.0 - 46.0 %   MCV 79.2 (L) 80.0 - 100.0 fL   MCH 29.2 26.0 - 34.0 pg   MCHC 36.8 (  H) 30.0 - 36.0 g/dL   RDW 11.4 (L) 11.5 - 15.5 %   Platelets 471 (H) 150 - 400 K/uL   nRBC 0.0 0.0 - 0.2 %   Neutrophils Relative % 64 %   Neutro Abs 8.9 (H) 1.7 - 7.7 K/uL   Lymphocytes Relative 25 %   Lymphs Abs 3.5 0.7 - 4.0 K/uL   Monocytes Relative 9 %   Monocytes Absolute 1.2 (H) 0.1 - 1.0 K/uL   Eosinophils Relative 1 %   Eosinophils Absolute 0.1 0.0 - 0.5 K/uL   Basophils Relative 0 %   Basophils Absolute 0.0 0.0 - 0.1 K/uL   Immature Granulocytes 1 %   Abs Immature Granulocytes 0.09 (H) 0.00 - 0.07 K/uL    Comment: Performed at [facility], [address] [place], [place] [address]  Comprehensive  metabolic panel     Status: Abnormal   Collection Time: 02/12/19  9:51 PM  Result Value Ref Range   Sodium 116 (LL) 135 - 145 mmol/L    Comment: CRITICAL RESULT CALLED TO, READ BACK BY AND VERIFIED WITH Jaaron Oleson WEBSTER ON 02/12/19 AT 2246 [facility]    Potassium 2.2 (LL) 3.5 - 5.1 mmol/L    Comment: CRITICAL RESULT CALLED TO, READ BACK BY AND VERIFIED WITH Kimie Pidcock WEBSTER ON 02/12/19 AT 2246 [facility]    Chloride 75 (L) 98 - 111 mmol/L   CO2 22 22 - 32 mmol/L   Glucose, Bld 132 (H) 70 - 99 mg/dL   BUN 9 6 - 20 mg/dL   Creatinine, Ser 0.79 0.44 - 1.00 mg/dL   Calcium 8.9 8.9 - 10.3 mg/dL   Total Protein 7.3 6.5 - 8.1 g/dL   Albumin 4.0 3.5 - 5.0 g/dL   AST 26 15 - 41 U/L   ALT 22 0 - 44 U/L   Alkaline Phosphatase 69 38 - 126 U/L   Total Bilirubin 0.8 0.3 - 1.2 mg/dL   GFR calc non Af Amer >60 >60 mL/min   GFR calc Af Amer >60 >60 mL/min   Anion gap 19 (H) 5 - 15    Comment: Performed at [facility], [address] [place], [place] [address]  Magnesium     Status: Abnormal   Collection Time: 02/12/19  9:51 PM  Result Value Ref Range   Magnesium 1.5 (L) 1.7 - 2.4 mg/dL    Comment: Performed at [facility], [address] [place], [place] [address]  Lactic acid, plasma     Status: Abnormal   Collection Time: 02/12/19 10:28 PM  Result Value Ref Range   Lactic Acid, Venous 3.3 (HH) 0.5 - 1.9 mmol/L    Comment: CRITICAL RESULT CALLED TO, READ BACK BY AND VERIFIED WITH Terica Yogi WEBSTER ON 02/12/19 AT 2309 [facility] Performed at [facility], [address] [place], [place] [address]   Urinalysis, Complete w Microscopic     Status: Abnormal   Collection Time: 02/12/19 10:28 PM  Result Value Ref Range   Color, Urine YELLOW (A) YELLOW   APPearance HAZY (A) CLEAR   Specific Gravity, Urine 1.014 1.005 - 1.030   pH 6.0 5.0 - 8.0   Glucose, UA NEGATIVE NEGATIVE mg/dL   Hgb urine dipstick NEGATIVE NEGATIVE   Bilirubin Urine NEGATIVE NEGATIVE   Ketones, ur 5 (A) NEGATIVE mg/dL    Protein, ur NEGATIVE NEGATIVE mg/dL   Nitrite NEGATIVE NEGATIVE   Leukocytes,Ua NEGATIVE NEGATIVE   RBC / HPF 0-5 0 - 5 RBC/hpf   WBC, UA  0-5 0 - 5 WBC/hpf   Bacteria, UA RARE (A) NONE SEEN   Squamous Epithelial / LPF 0-5 0 - 5   Mucus PRESENT     Comment: Performed at [facility], [address] [place], [place] [address]  SARS Coronavirus 2 [facility] order, Performed in [facility] hospital lab) Nasopharyngeal Nasopharyngeal Swab     Status: None   Collection Time: 02/12/19 11:26 PM   Specimen: Nasopharyngeal Swab  Result Value Ref Range   SARS Coronavirus 2 NEGATIVE NEGATIVE    Comment: (NOTE) If result is NEGATIVE SARS-CoV-2 target nucleic acids are NOT DETECTED. The SARS-CoV-2 RNA is generally detectable in upper and lower  respiratory specimens during the acute phase of infection. The lowest  concentration of SARS-CoV-2 viral copies this assay can detect is 250  copies / mL. A negative result does not preclude SARS-CoV-2 infection  and should not be used as the sole basis for treatment or other  patient management decisions.  A negative result may occur with  improper specimen collection / handling, submission of specimen other  than nasopharyngeal swab, presence of viral mutation(s) within the  areas targeted by this assay, and inadequate number of viral copies  (<250 copies / mL). A negative result must be combined with clinical  observations, patient history, and epidemiological information. If result is POSITIVE SARS-CoV-2 target nucleic acids are DETECTED. The SARS-CoV-2 RNA is generally detectable in upper and lower  respiratory specimens dur ing the acute phase of infection.  Positive  results are indicative of active infection with SARS-CoV-2.  Clinical  correlation with patient history and other diagnostic information is  necessary to determine patient infection status.  Positive results do  not rule out bacterial infection or co-infection with other  viruses. If result is PRESUMPTIVE POSTIVE SARS-CoV-2 nucleic acids MAY BE PRESENT.   A presumptive positive result was obtained on the submitted specimen  and confirmed on repeat testing.  While 2019 novel coronavirus  (SARS-CoV-2) nucleic acids may be present in the submitted sample  additional confirmatory testing may be necessary for epidemiological  and / or clinical management purposes  to differentiate between  SARS-CoV-2 and other Sarbecovirus currently known to infect humans.  If clinically indicated additional testing with an alternate test  methodology [phone]) is advised. The SARS-CoV-2 RNA is generally  detectable in upper and lower respiratory sp ecimens during the acute  phase of infection. The expected result is Negative. Fact Sheet for Patients:  StrictlyIdeas.no Fact Sheet for Healthcare Providers: BankingDealers.co.za This test is not yet approved or cleared by the [place] FDA and has been authorized for detection and/or diagnosis of SARS-CoV-2 by FDA under an Emergency Use Authorization (EUA).  This EUA will remain in effect (meaning this test can be used) for the duration of the COVID-19 declaration under Section 564(b)(1) of the Act, 21 U.S.C. section 360bbb-3(b)(1), unless the authorization is terminated or revoked sooner. Performed at [facility], [address] [place], [place] [address]   Pregnancy, urine POC     Status: None   Collection Time: 02/12/19 11:32 PM  Result Value Ref Range   Preg Test, Ur NEGATIVE NEGATIVE    Comment:        THE SENSITIVITY OF THIS METHODOLOGY IS >24 mIU/mL   Lactic acid, plasma     Status: Abnormal   Collection Time: 02/13/19  1:10 AM  Result Value Ref Range   Lactic Acid, Venous 2.6 (HH) 0.5 - 1.9 mmol/L    Comment:  CRITICAL VALUE NOTED. VALUE IS CONSISTENT WITH PREVIOUSLY REPORTED/CALLED VALUE [facility] Performed at [facility], [address]  [place], [place] [address]    Ct Abdomen Pelvis W Contrast  Result Date: 02/13/2019 CLINICAL DATA:  Nausea, vomiting, abdominal pain, recent clostridium difficile colitis EXAM: CT ABDOMEN AND PELVIS WITH CONTRAST TECHNIQUE: Multidetector CT imaging of the abdomen and pelvis was performed using the standard protocol following bolus administration of intravenous contrast. CONTRAST:  110mL OMNIPAQUE IOHEXOL 300 MG/ML  SOLN COMPARISON:  CT 02/05/2019 FINDINGS: Lower chest: Lung bases are clear. Normal heart size. No pericardial effusion. Hepatobiliary: No focal liver abnormality is seen. No gallstones, gallbladder wall thickening, or biliary dilatation. Pancreas: Unremarkable. No pancreatic ductal dilatation or surrounding inflammatory changes. Spleen: Normal in size without focal abnormality. Adrenals/Urinary Tract: Adrenal glands are unremarkable. Kidneys are normal, without renal calculi, focal lesion, or hydronephrosis. Urinary bladder is largely decompressed at the time of exam and therefore poorly evaluated by CT imaging. Stomach/Bowel: Mild thickening of a jejunal loop in the left lower quadrant with adjacent mesenteric hazy stranding (2/54). No other small bowel dilatation or wall thickening. No extraluminal gas or organized abscess or collection. The appendix is surgically absent. The colon is largely fluid-filled with some mucosal hyperenhancement. Portion of the hepatic flexure is interposed anterior to the liver. Vascular/Lymphatic: Few reactive mid mesenteric lymph nodes. The aorta is normal caliber. Reproductive: Circular lucency in the vaginal canal, likely contraceptive device. Normal uterus. No concerning adnexal lesions. Other: Small volume of low-attenuation free fluid in the pelvis, possibly physiologic versus reactive. No bowel containing hernia. No free abdominopelvic air Musculoskeletal: No acute osseous abnormality or suspicious osseous lesion. IMPRESSION: 1. Mild thickening of a jejunal loop in  the left lower quadrant with adjacent mesenteric hazy stranding, consistent with enteritis, which could be infectious or inflammatory. No evidence of perforation or abscess formation. 2. The colon is largely fluid-filled with some mucosal hyperenhancement, consistent with history of diarrhea. 3. Small volume of low-attenuation free fluid in the pelvis, possibly physiologic versus reactive. Electronically Signed   By: Lovena Le M.D.   On: 02/13/2019 00:14    Pending Labs Unresulted Labs (From admission, onward)    Start     Ordered   02/13/19 [id number]  Basic metabolic panel  Tomorrow morning,   STAT     02/13/19 0034   02/13/19 0500  CBC  Tomorrow morning,   STAT     02/13/19 0034   02/13/19 0134  TSH  Once,   STAT     02/13/19 0133   02/13/19 0133  Osmolality  ONCE - STAT,   STAT     02/13/19 0133   02/13/19 0133  Osmolality, urine  Once,   STAT     02/13/19 0133   02/13/19 0130  GI pathogen panel by PCR, stool  (Gastrointestinal Panel by PCR, Stool                                                                                                                                                     *  Does Not include CLOSTRIDIUM DIFFICILE testing.**If CDIFF testing is needed, select the C Difficile Quick Screen w PCR reflex order below)  Once,   STAT     02/13/19 0130   02/12/19 2318  Blood culture (routine x 2)  BLOOD CULTURE X 2,   STAT     02/12/19 2318          Vitals/Pain Today's Vitals   02/12/19 2243 02/13/19 0000 02/13/19 0100 02/13/19 0142  BP:    137/79  Pulse:  96 98 93  Resp:  18 17 17   Temp:    98 F (36.7 C)  TempSrc:    Oral  SpO2:  99% 98% 98%  Weight:      Height:      PainSc: 5    6     Isolation Precautions Enteric precautions (UV disinfection)  Medications Medications  potassium chloride 10 mEq in 100 mL IVPB (10 mEq Intravenous New Bag/Given 02/13/19 0049)  magnesium sulfate IVPB 2 g 50 mL (2 g Intravenous New Bag/Given 02/13/19 0047)  metroNIDAZOLE  (FLAGYL) IVPB 500 mg (500 mg Intravenous New Bag/Given 02/13/19 0121)  vancomycin (VANCOCIN) 50 mg/mL oral solution 500 mg (500 mg Oral Given 02/13/19 0125)  lactated ringers infusion ( Intravenous New Bag/Given 02/13/19 0124)  oxyCODONE-acetaminophen (PERCOCET/ROXICET) 5-325 MG per tablet 1 tablet (1 tablet Oral Given 02/13/19 0117)  vancomycin (VANCOCIN) 50 mg/mL oral solution 125 mg (has no administration in time range)  vancomycin (VANCOCIN) 50 mg/mL oral solution 125 mg (has no administration in time range)  atorvastatin (LIPITOR) tablet 20 mg (has no administration in time range)  ALPRAZolam (XANAX) tablet 1 mg (has no administration in time range)  DULoxetine (CYMBALTA) DR capsule 60 mg (has no administration in time range)  hydrOXYzine (ATARAX/VISTARIL) tablet 25 mg (has no administration in time range)  dicyclomine (BENTYL) capsule 10 mg (has no administration in time range)  pantoprazole (PROTONIX) EC tablet 40 mg (has no administration in time range)  cyclobenzaprine (FLEXERIL) tablet 10 mg (has no administration in time range)  gabapentin (NEURONTIN) capsule 300 mg (has no administration in time range)  promethazine (PHENERGAN) tablet 25 mg (has no administration in time range)  enoxaparin (LOVENOX) injection 40 mg (has no administration in time range)  0.9 % NaCl with KCl 40 mEq / L  infusion (has no administration in time range)  acetaminophen (TYLENOL) tablet 650 mg (has no administration in time range)    Or  acetaminophen (TYLENOL) suppository 650 mg (has no administration in time range)  traZODone (DESYREL) tablet 25 mg (has no administration in time range)  magnesium hydroxide (MILK OF MAGNESIA) suspension 30 mL (has no administration in time range)  ondansetron (ZOFRAN) tablet 4 mg (has no administration in time range)    Or  ondansetron (ZOFRAN) injection 4 mg (has no administration in time range)  metroNIDAZOLE (FLAGYL) IVPB 500 mg (has no administration in time range)   morphine 2 MG/ML injection 2 mg (has no administration in time range)  metoCLOPramide (REGLAN) injection 10 mg (has no administration in time range)  sodium chloride 0.9 % bolus 1,000 mL (0 mLs Intravenous Stopped 02/12/19 2329)  promethazine (PHENERGAN) injection 25 mg (25 mg Intravenous Given 02/12/19 2212)  morphine 4 MG/ML injection 4 mg (4 mg Intravenous Given 02/12/19 2216)  potassium chloride SA (K-DUR) CR tablet 40 mEq (40 mEq Oral Given 02/13/19 0022)  vancomycin (VANCOCIN) 500 mg in sodium chloride irrigation 0.9 % 100 mL ENEMA (500 mg Rectal Given 02/13/19 0127)  iohexol (OMNIPAQUE) 300 MG/ML solution 100 mL (100 mLs Intravenous Contrast Given 02/12/19 2348)    Mobility walks     Focused Assessments Cardiac Assessment Handoff:    No results found for: CKTOTAL, CKMB, CKMBINDEX, TROPONINI No results found for: DDIMER Does the Patient currently have chest pain? No      R Recommendations: See Admitting Provider Note  Report given to:   Additional Notes: brother has left bedside

## 2019-02-14 LAB — IRON AND TIBC
Iron: 100 ug/dL (ref 28–170)
Saturation Ratios: 28 % (ref 10.4–31.8)
TIBC: 356 ug/dL (ref 250–450)
UIBC: 256 ug/dL

## 2019-02-14 LAB — FERRITIN: Ferritin: 68 ng/mL (ref 11–307)

## 2019-02-14 LAB — CBC
HCT: 30.1 % — ABNORMAL LOW (ref 36.0–46.0)
Hemoglobin: 10.6 g/dL — ABNORMAL LOW (ref 12.0–15.0)
MCH: 29.3 pg (ref 26.0–34.0)
MCHC: 35.2 g/dL (ref 30.0–36.0)
MCV: 83.1 fL (ref 80.0–100.0)
Platelets: 319 10*3/uL (ref 150–400)
RBC: 3.62 MIL/uL — ABNORMAL LOW (ref 3.87–5.11)
RDW: 12 % (ref 11.5–15.5)
WBC: 9.1 10*3/uL (ref 4.0–10.5)
nRBC: 0 % (ref 0.0–0.2)

## 2019-02-14 LAB — BASIC METABOLIC PANEL
Anion gap: 6 (ref 5–15)
BUN: 5 mg/dL — ABNORMAL LOW (ref 6–20)
CO2: 27 mmol/L (ref 22–32)
Calcium: 8.1 mg/dL — ABNORMAL LOW (ref 8.9–10.3)
Chloride: 101 mmol/L (ref 98–111)
Creatinine, Ser: 0.84 mg/dL (ref 0.44–1.00)
GFR calc Af Amer: >60 mL/min (ref >60)
GFR calc non Af Amer: >60 mL/min (ref >60)
Glucose, Bld: 89 mg/dL (ref 70–99)
Potassium: 3.6 mmol/L (ref 3.5–5.1)
Sodium: 134 mmol/L — ABNORMAL LOW (ref 135–145)

## 2019-02-14 LAB — FOLATE: Folate: 8.6 ng/mL (ref >5.9)

## 2019-02-14 LAB — VITAMIN B12: Vitamin B-12: 246 pg/mL (ref 180–914)

## 2019-02-14 MED ORDER — FAMOTIDINE 20 MG PO TABS
20.0000 mg | ORAL_TABLET | Freq: Two times a day (BID) | ORAL | Status: DC
  Administered 2019-02-14 – 2019-02-17 (×7): 20 mg via ORAL
  Filled 2019-02-14 (×7): qty 1

## 2019-02-14 NOTE — Progress Notes (Signed)
PHARMACIST - PHYSICIAN COMMUNICATION  DR:   Brett Albino  CONCERNING: IV to Oral Route Change Policy  RECOMMENDATION: This patient is receiving famotidine by the intravenous route.  Based on criteria approved by the Pharmacy and Therapeutics Committee, the intravenous medication(s) is/are being converted to the equivalent oral dose form(s).   DESCRIPTION: These criteria include:  The patient is eating (either orally or via tube) and/or has been taking other orally administered medications for a least 24 hours  The patient has no evidence of active gastrointestinal bleeding or impaired GI absorption (gastrectomy, short bowel, patient on TNA or NPO).  If you have questions about this conversion, please contact the [place], PharmD 02/14/2019 1:31 PM

## 2019-02-14 NOTE — Progress Notes (Signed)
[place] [facility] [place] NAME: Molly Cisneros    MR#:  [id number]  DATE OF BIRTH:  11-21-1985  SUBJECTIVE:   Patient continues to have LLQ "crampy" abdominal pain this morning. She only had 2 BMs in the last 24 hours. No vomiting but still having some nausea.  REVIEW OF SYSTEMS:  Review of Systems  Constitutional: Negative for chills and fever.  HENT: Negative for congestion and sore throat.   Eyes: Negative for blurred vision and double vision.  Respiratory: Negative for cough and shortness of breath.   Cardiovascular: Negative for chest pain and palpitations.  Gastrointestinal: Positive for abdominal pain, diarrhea and nausea. Negative for vomiting.  Genitourinary: Negative for dysuria and urgency.  Musculoskeletal: Negative for back pain and neck pain.  Neurological: Negative for dizziness and headaches.  Psychiatric/Behavioral: Negative for depression. The patient is not nervous/anxious.     DRUG ALLERGIES:   Allergies  Allergen Reactions  . Shellfish Allergy Anaphylaxis  . Aloe Vera Dermatitis   VITALS:  Blood pressure 105/68, pulse 79, temperature 98.5 F (36.9 C), temperature source Oral, resp. rate 18, height 5' 11.5" (1.816 m), weight 99.8 kg, last menstrual period 01/18/2019, SpO2 100 %. PHYSICAL EXAMINATION:  Physical Exam  GENERAL:  Laying in the bed with no acute distress.  HEENT: Head atraumatic, normocephalic. Pupils equal, round, reactive to light and accommodation. No scleral icterus. Extraocular muscles intact. Oropharynx and nasopharynx clear.  NECK:  Supple, no jugular venous distention. No thyroid enlargement. LUNGS: Lungs are clear to auscultation bilaterally. No wheezes, crackles, rhonchi. No use of accessory muscles of respiration.  CARDIOVASCULAR: RRR, S1, S2 normal. No murmurs, rubs, or gallops.  ABDOMEN: Soft, nondistended. Bowel sounds present. +tenderness to palpation of the left lower quadrant, no rebound or  guarding. EXTREMITIES: No pedal edema, cyanosis, or clubbing.  NEUROLOGIC: CN 2-12 intact, no focal deficits. 5/5 muscle strength throughout all extremities. Sensation intact throughout. Gait not checked.  PSYCHIATRIC: The patient is alert and oriented x 3.  SKIN: No obvious rash, lesion, or ulcer.  LABORATORY PANEL:  Female CBC Recent Labs  Lab 02/14/19 0422  WBC 9.1  HGB 10.6*  HCT 30.1*  PLT 319   ------------------------------------------------------------------------------------------------------------------ Chemistries  Recent Labs  Lab 02/12/19 2151 02/13/19 0435  02/14/19 0422  NA 116* 119*   < > 134*  K 2.2* 3.1*  --  3.6  CL 75* 82*  --  101  CO2 22 24  --  27  GLUCOSE 132* 113*  --  89  BUN 9 8  --  5*  CREATININE 0.79 0.65  --  0.84  CALCIUM 8.9 8.0*  --  8.1*  MG 1.5* 2.0  --   --   AST 26  --   --   --   ALT 22  --   --   --   ALKPHOS 69  --   --   --   BILITOT 0.8  --   --   --    < > = values in this interval not displayed.   RADIOLOGY:  No results found. ASSESSMENT AND PLAN:   Acute gastroenteritis with intractable nausea and vomiting as well as diarrhea- unclear if this is a new gastroenteritis or just a worsening of her C. Diff.  -GI pathogen panel pending -Continue IV flagyl for now- will likely d/c if GI pathogen panel is negatve -Continue IVFs -Advance to regular diet today.  C. Diff colitis- diarrhea has improved  over the last 24 hours. -Continue po vancomycin  Hypovolemic hyponatremia- resolved with IVFs -Continue IVFs for today, can likely stop tomorrow  Hyperlipidemia- stable -Continue home statin  Normocytic anemia- hemoglobin has dropped, likely dilutional due to IVFs -Anemia panel unremarkable -Monitor  DVT prophylaxis- lovenox  All the records are reviewed and case discussed with Care Management/Social Worker. Management plans discussed with the patient, family and they are in agreement.  CODE STATUS: Full Code  TOTAL  TIME TAKING CARE OF THIS PATIENT: 40 minutes.   More than 50% of the time was spent in counseling/coordination of care: YES  POSSIBLE D/C IN 1-2 DAYS, DEPENDING ON CLINICAL CONDITION.   Berna Spare Tanis Hensarling M.D on 02/14/2019 at 10:23 AM  Between 7am to 6pm - Pager - [phone]  After 6pm go to [email or web address] - Proofreader  [facility]  Office  [phone]  CC: Primary care physician; Glean Hess, MD  Note: This dictation was prepared with Dragon dictation along with smaller phrase technology. Any transcriptional errors that result from this process are unintentional.

## 2019-02-15 LAB — MAGNESIUM: Magnesium: 1.9 mg/dL (ref 1.7–2.4)

## 2019-02-15 LAB — BASIC METABOLIC PANEL
Anion gap: 8 (ref 5–15)
BUN: 9 mg/dL (ref 6–20)
CO2: 23 mmol/L (ref 22–32)
Calcium: 8.6 mg/dL — ABNORMAL LOW (ref 8.9–10.3)
Chloride: 103 mmol/L (ref 98–111)
Creatinine, Ser: 0.8 mg/dL (ref 0.44–1.00)
GFR calc Af Amer: >60 mL/min (ref >60)
GFR calc non Af Amer: >60 mL/min (ref >60)
Glucose, Bld: 95 mg/dL (ref 70–99)
Potassium: 3.7 mmol/L (ref 3.5–5.1)
Sodium: 134 mmol/L — ABNORMAL LOW (ref 135–145)

## 2019-02-15 LAB — CBC
HCT: 31.5 % — ABNORMAL LOW (ref 36.0–46.0)
Hemoglobin: 10.8 g/dL — ABNORMAL LOW (ref 12.0–15.0)
MCH: 29.2 pg (ref 26.0–34.0)
MCHC: 34.3 g/dL (ref 30.0–36.0)
MCV: 85.1 fL (ref 80.0–100.0)
Platelets: 380 10*3/uL (ref 150–400)
RBC: 3.7 MIL/uL — ABNORMAL LOW (ref 3.87–5.11)
RDW: 12.2 % (ref 11.5–15.5)
WBC: 10.6 10*3/uL — ABNORMAL HIGH (ref 4.0–10.5)
nRBC: 0 % (ref 0.0–0.2)

## 2019-02-15 MED ORDER — METRONIDAZOLE IN NACL 5-0.79 MG/ML-% IV SOLN
500.0000 mg | Freq: Three times a day (TID) | INTRAVENOUS | Status: DC
  Filled 2019-02-15 (×3): qty 100

## 2019-02-15 MED ORDER — PROMETHAZINE HCL 25 MG/ML IJ SOLN
12.5000 mg | Freq: Four times a day (QID) | INTRAMUSCULAR | Status: DC | PRN
  Administered 2019-02-15 – 2019-02-17 (×5): 12.5 mg via INTRAVENOUS
  Filled 2019-02-15 (×5): qty 1

## 2019-02-15 MED ORDER — SODIUM CHLORIDE 0.9 % IV SOLN
INTRAVENOUS | Status: DC
  Administered 2019-02-15 – 2019-02-18 (×7): via INTRAVENOUS

## 2019-02-15 MED ORDER — POTASSIUM CHLORIDE CRYS ER 20 MEQ PO TBCR
40.0000 meq | EXTENDED_RELEASE_TABLET | Freq: Once | ORAL | Status: AC
  Administered 2019-02-15: 40 meq via ORAL
  Filled 2019-02-15: qty 2

## 2019-02-15 MED ORDER — MAGNESIUM SULFATE IN D5W 1-5 GM/100ML-% IV SOLN
1.0000 g | Freq: Once | INTRAVENOUS | Status: AC
  Administered 2019-02-15: 08:00:00 1 g via INTRAVENOUS
  Filled 2019-02-15: qty 100

## 2019-02-15 MED ORDER — VANCOMYCIN 50 MG/ML ORAL SOLUTION
500.0000 mg | Freq: Four times a day (QID) | ORAL | Status: DC
  Administered 2019-02-15 – 2019-02-16 (×5): 500 mg via ORAL
  Filled 2019-02-15 (×10): qty 10

## 2019-02-15 MED ORDER — ZINC OXIDE 40 % EX OINT
TOPICAL_OINTMENT | CUTANEOUS | Status: DC | PRN
  Filled 2019-02-15: qty 113

## 2019-02-15 NOTE — Progress Notes (Signed)
[place] [facility] [place] NAME: Molly Cisneros    MR#:  [id number]  DATE OF BIRTH:  [birth year]  SUBJECTIVE:   Patient has had a bowel movement every hour for the last 24 hours.  She states she is having continued left lower quadrant abdominal pain.  She is tearful this morning.  She denies any fevers or chills.  No vomiting.  REVIEW OF SYSTEMS:  Review of Systems  Constitutional: Negative for chills and fever.  HENT: Negative for congestion and sore throat.   Eyes: Negative for blurred vision and double vision.  Respiratory: Negative for cough and shortness of breath.   Cardiovascular: Negative for chest pain and palpitations.  Gastrointestinal: Positive for abdominal pain, diarrhea and nausea. Negative for vomiting.  Genitourinary: Negative for dysuria and urgency.  Musculoskeletal: Negative for back pain and neck pain.  Neurological: Negative for dizziness and headaches.  Psychiatric/Behavioral: Negative for depression. The patient is not nervous/anxious.     DRUG ALLERGIES:   Allergies  Allergen Reactions  . Shellfish Allergy Anaphylaxis  . Aloe Vera Dermatitis   VITALS:  Blood pressure 105/76, pulse (!) 104, temperature 98.2 F (36.8 C), temperature source Oral, resp. rate 16, height 5' 11.5" (1.816 m), weight 99.8 kg, last menstrual period 01/18/2019, SpO2 99 %. PHYSICAL EXAMINATION:  Physical Exam  GENERAL:  Laying in the bed with no acute distress.  Tearful. HEENT: Head atraumatic, normocephalic. Pupils equal, round, reactive to light and accommodation. No scleral icterus. Extraocular muscles intact. Oropharynx and nasopharynx clear.  NECK:  Supple, no jugular venous distention. No thyroid enlargement. LUNGS: Lungs are clear to auscultation bilaterally. No wheezes, crackles, rhonchi. No use of accessory muscles of respiration.  CARDIOVASCULAR: RRR, S1, S2 normal. No murmurs, rubs, or gallops.  ABDOMEN: Soft, nondistended. Bowel  sounds present. +tenderness to palpation of the left lower quadrant, no rebound or guarding. EXTREMITIES: No pedal edema, cyanosis, or clubbing.  NEUROLOGIC: CN 2-12 intact, no focal deficits. 5/5 muscle strength throughout all extremities. Sensation intact throughout. Gait not checked.  PSYCHIATRIC: The patient is alert and oriented x 3.  SKIN: No obvious rash, lesion, or ulcer.  LABORATORY PANEL:  Female CBC Recent Labs  Lab 02/15/19 0350  WBC 10.6*  HGB 10.8*  HCT 31.5*  PLT 380   ------------------------------------------------------------------------------------------------------------------ Chemistries  Recent Labs  Lab 02/12/19 2151  02/15/19 0350  NA 116*   < > 134*  K 2.2*   < > 3.7  CL 75*   < > 103  CO2 22   < > 23  GLUCOSE 132*   < > 95  BUN 9   < > 9  CREATININE 0.79   < > 0.80  CALCIUM 8.9   < > 8.6*  MG 1.5*   < > 1.9  AST 26  --   --   ALT 22  --   --   ALKPHOS 69  --   --   BILITOT 0.8  --   --    < > = values in this interval not displayed.   RADIOLOGY:  No results found. ASSESSMENT AND PLAN:   C. Diff colitis- patient has had worsening diarrhea over the last 24 hours. -Will increase p.o. vancomycin from 150 mg to 500 mg 4 times daily -GI pathogen panel pending -IV Flagyl was stopped yesterday, but we will restart given her worsening diarrhea -Continue IV fluids -Pain control and IV antiemetics  Hypovolemic hyponatremia- resolved with IVFs -Continue IVFs  Hyperlipidemia- stable -Continue home statin  Normocytic anemia- hemoglobin has dropped, likely dilutional due to IVFs -Anemia panel unremarkable -Monitor  DVT prophylaxis- lovenox  All the records are reviewed and case discussed with Care Management/Social Worker. Management plans discussed with the patient, family and they are in agreement.  CODE STATUS: Full Code  TOTAL TIME TAKING CARE OF THIS PATIENT: 35 minutes.   More than 50% of the time was spent in  counseling/coordination of care: YES  POSSIBLE D/C IN 1-2 DAYS, DEPENDING ON CLINICAL CONDITION.   Berna Spare Tayden Duran M.D on 02/15/2019 at 2:39 PM  Between 7am to 6pm - Pager - [phone]  After 6pm go to [email or web address] - Proofreader  [facility]  Office  [phone]  CC: Primary care physician; Glean Hess, MD  Note: This dictation was prepared with Dragon dictation along with smaller phrase technology. Any transcriptional errors that result from this process are unintentional.

## 2019-02-15 NOTE — Plan of Care (Signed)
  Problem: Elimination: Goal: Will not experience complications related to bowel motility Outcome: Progressing Goal: Will not experience complications related to urinary retention Outcome: Progressing  Patient with frequent BM. Medications adjusted to improve symptoms.

## 2019-02-16 ENCOUNTER — Ambulatory Visit: Payer: PRIVATE HEALTH INSURANCE | Admitting: Internal Medicine

## 2019-02-16 LAB — BASIC METABOLIC PANEL
Anion gap: 10 (ref 5–15)
BUN: 9 mg/dL (ref 6–20)
CO2: 22 mmol/L (ref 22–32)
Calcium: 8.7 mg/dL — ABNORMAL LOW (ref 8.9–10.3)
Chloride: 105 mmol/L (ref 98–111)
Creatinine, Ser: 0.77 mg/dL (ref 0.44–1.00)
GFR calc Af Amer: >60 mL/min (ref >60)
GFR calc non Af Amer: >60 mL/min (ref >60)
Glucose, Bld: 94 mg/dL (ref 70–99)
Potassium: 3.9 mmol/L (ref 3.5–5.1)
Sodium: 137 mmol/L (ref 135–145)

## 2019-02-16 LAB — CBC
HCT: 32.9 % — ABNORMAL LOW (ref 36.0–46.0)
Hemoglobin: 11.3 g/dL — ABNORMAL LOW (ref 12.0–15.0)
MCH: 29.3 pg (ref 26.0–34.0)
MCHC: 34.3 g/dL (ref 30.0–36.0)
MCV: 85.2 fL (ref 80.0–100.0)
Platelets: 396 10*3/uL (ref 150–400)
RBC: 3.86 MIL/uL — ABNORMAL LOW (ref 3.87–5.11)
RDW: 12.2 % (ref 11.5–15.5)
WBC: 9.3 10*3/uL (ref 4.0–10.5)
nRBC: 0 % (ref 0.0–0.2)

## 2019-02-16 MED ORDER — FIDAXOMICIN 200 MG PO TABS
200.0000 mg | ORAL_TABLET | Freq: Two times a day (BID) | ORAL | Status: DC
  Administered 2019-02-16 – 2019-02-17 (×2): 200 mg via ORAL
  Filled 2019-02-16 (×3): qty 1

## 2019-02-16 MED ORDER — METRONIDAZOLE IN NACL 5-0.79 MG/ML-% IV SOLN
500.0000 mg | Freq: Three times a day (TID) | INTRAVENOUS | Status: DC
  Administered 2019-02-16: 11:00:00 500 mg via INTRAVENOUS
  Filled 2019-02-16 (×4): qty 100

## 2019-02-16 NOTE — Consult Note (Signed)
GI Inpatient Consult Note  Reason for Consult: C diff colitis, chronic diarrhea, LLQ abd pain   Attending Requesting Consult: Dr. Hyman Bible, MD  History of Present Illness: Molly Cisneros is a 33 y.o. female seen for evaluation of persistent diarrhea, hx of Clostridium difficile colitis at the request of Dr. Brett Albino, MD. Pt was admitted 09/27 for acute gastroenteritis with intractable nausea and vomiting. She was admitted 09/19-09/23 for C difficile colitis where she was given oral vancomycin. This is her first episode of c diff colitis and this was diagnosed on 09/19 with antigen positive, toxin negative, PCR positive stool sample. She reports she has been on oral vancomycin for the past 11 days, but not noticing any symptomatic improvement. She reports she has been having chronic diarrhea over the past 8 months. It has been very embarrassing to the patient and uncomfortably b/c she is unsure what has been causing the diarrhea. She was seen by Dr. Marius Ditch in August for diagnostic colonoscopy and endoscopy which showed normal appearing terminal ileum, normal appearing mucosa with random biopsies negative for any type of colitis. Duodenal biopsies were also negative. She reports she was told that she has IBS-D. Patient reports her diarrhea has been the same for the past 8 months, but definitely seems to be worse over the past two weeks. She has been keeping a stool log on her phone and reports anywhere from 10-18 loose BMs daily that are watery. Most BMs are associated with some epigastric and LLQ abdominal pain that she describes as crampy in nature. CT scan on 09/27 showed mild thickening of a jejunal loop in LLQ with adjacent mesenteric hazy stranding, consistent with enteritis. No evidence of perforation or abscess. Colon is largely fluid-filled with some mucosal hyperenhancement. She has noticed some bright red blood on tissue paper and sometimes in the toilet bowel intermittently over the past several months.  She denies any gross hematochezia or melena. She reports she had lap appendectomy 11/06/18 by Dr. Peyton Najjar for appendicitis. She reports she was given Kelfex for a UTI over the past month. She was also given a short prednisone taper for a skin rash. She denies any fever, chills, or night sweats. There is no known family hx of colon cancer, adenomatous polyps, or IBD. She reports she is very embarrassed about her current symptoms and reports she is motivated to get to feeling better. She denies any frequent NSAID use. She does endorse about a 20-30 lb unintentional weight loss over the past calendar year.    Last Colonoscopy: 12/2018 Preparation of the colon was fair. - The examined portion of the ileum was normal. - Normal mucosa in the entire examined colon. Biopsied. - The distal rectum and anal verge are normal on retroflexion view. - Stool in the entire examined colon. Last Endoscopy: 12/2018 - Normal duodenal bulb and second portion of the duodenum. Biopsied. - A medium amount of food (residue) in the stomach. - Normal stomach. Biopsied. - Normal gastroesophageal junction and esophagus.   Past Medical History:  Past Medical History:  Diagnosis Date  . Anxiety   . Clostridium difficile diarrhea   . Hypertension   . Shingles    reoccuring shingles on face    Problem List: Patient Active Problem List   Diagnosis Date Noted  . Hyponatremia 02/13/2019  . Severe sepsis ([place]) 02/05/2019  . C. difficile diarrhea 02/05/2019  . Hypokalemia 02/05/2019  . Hypomagnesemia 02/05/2019  . Chronic diarrhea of unknown origin   . Loss of weight   .  Acute appendicitis with localized peritonitis 11/06/2018  . Essential hypertension 11/03/2018  . Hyperlipidemia, mixed 11/03/2018  . Degeneration of C5-C6 intervertebral disc 08/31/2018  . Herniation of left side of L4-L5 intervertebral disc 06/05/2017  . Obesity (BMI 30-39.9) 06/05/2017  . Chronic left-sided low back pain without sciatica  07/23/2015    Past Surgical History: Past Surgical History:  Procedure Laterality Date  . COLONOSCOPY WITH PROPOFOL N/A 12/23/2018   Procedure: COLONOSCOPY WITH PROPOFOL;  Surgeon: Lin Landsman, MD;  Location: [facility] ENDOSCOPY;  Service: Gastroenterology;  Laterality: N/A;  . ESOPHAGOGASTRODUODENOSCOPY (EGD) WITH PROPOFOL N/A 12/23/2018   Procedure: ESOPHAGOGASTRODUODENOSCOPY (EGD) WITH PROPOFOL;  Surgeon: Lin Landsman, MD;  Location: [facility] ENDOSCOPY;  Service: Gastroenterology;  Laterality: N/A;  . LAPAROSCOPIC APPENDECTOMY N/A 11/06/2018   Procedure: APPENDECTOMY LAPAROSCOPIC;  Surgeon: Herbert Pun, MD;  Location: [facility] ORS;  Service: General;  Laterality: N/A;    Allergies: Allergies  Allergen Reactions  . Shellfish Allergy Anaphylaxis  . Aloe Vera Dermatitis    Home Medications: Medications Prior to Admission  Medication Sig Dispense Refill Last Dose  . ALPRAZolam (XANAX) 1 MG tablet Take 1 mg by mouth at bedtime as needed for anxiety.   prn at prn  . amitriptyline (ELAVIL) 75 MG tablet Take 75 mg by mouth at bedtime.    Past Week at Unknown time  . amphetamine-dextroamphetamine (ADDERALL XR) 30 MG 24 hr capsule Take 30 mg by mouth daily.    Past Week at Unknown time  . amphetamine-dextroamphetamine (ADDERALL) 20 MG tablet Take 20 mg by mouth 2 (two) times a day.    Past Week at Unknown time  . atorvastatin (LIPITOR) 20 MG tablet TAKE ONE TABLET BY MOUTH DAILY. (Patient taking differently: Take 20 mg by mouth daily. ) 90 tablet 0 Past Week at Unknown time  . cyclobenzaprine (FLEXERIL) 10 MG tablet Take 10 mg by mouth at bedtime.    Past Week at Unknown time  . dicyclomine (BENTYL) 10 MG capsule TAKE (1) CAPSULE BY MOUTH FOUR TIMES A DAY BEFORE MEALS AND AT BEDTIME. (Patient taking differently: Take 10 mg by mouth 4 (four) times daily. ) 120 capsule 0 Past Week at Unknown time  . DULoxetine (CYMBALTA) 60 MG capsule Take 60 mg by mouth daily.    Past Week at Unknown time   . gabapentin (NEURONTIN) 300 MG capsule TAKE (1) CAPSULE BY MOUTH TWICE DAILY (Patient taking differently: Take 300 mg by mouth 2 (two) times daily. ) 60 capsule 2 Past Week at Unknown time  . hydrOXYzine (VISTARIL) 25 MG capsule TAKE 1-2 CAPSULES BY MOUTH EVERY 6 HOURS. 60 capsule 2 Past Week at Unknown time  . ondansetron (ZOFRAN) 4 MG tablet Take 1 tablet (4 mg total) by mouth every 8 (eight) hours as needed for nausea or vomiting. 60 tablet 0 prn at prn  . oxyCODONE-acetaminophen (PERCOCET) 5-325 MG tablet Take 1 tablet by mouth every 8 (eight) hours as needed for severe pain. 20 tablet 0 prn at prn  . pantoprazole (PROTONIX) 40 MG tablet Take 40 mg by mouth 2 (two) times daily.   Past Week at Unknown time  . promethazine (PHENERGAN) 25 MG tablet Take 1 tablet (25 mg total) by mouth at bedtime as needed for nausea or vomiting. 30 tablet 0 prn at prn  . valACYclovir (VALTREX) 1000 MG tablet Take 1 tablet (1,000 mg total) by mouth 2 (two) times daily as needed. 60 tablet 1 prn at prn  . [EXPIRED] vancomycin (VANCOCIN) 50 mg/mL oral solution  Take 2.5 mLs (125 mg total) by mouth 4 (four) times daily for 6 days. 60 mL 0 Past Week at Unknown time  . etonogestrel-ethinyl estradiol (NUVARING) 0.12-0.015 MG/24HR vaginal ring USE AS DIRECTED BY DOCTOR 3 each 3    Home medication reconciliation was completed with the patient.   Scheduled Inpatient Medications:   . atorvastatin  20 mg Oral Daily  . dicyclomine  10 mg Oral QID  . DULoxetine  60 mg Oral Daily  . enoxaparin (LOVENOX) injection  40 mg Subcutaneous Q24H  . famotidine  20 mg Oral BID  . gabapentin  300 mg Oral BID  . vancomycin  500 mg Oral QID    Continuous Inpatient Infusions:   . sodium chloride 100 mL/hr at 02/16/19 0652  . metronidazole 500 mg (02/16/19 1117)    PRN Inpatient Medications:  acetaminophen **OR** acetaminophen, ALPRAZolam, hydrOXYzine, liver oil-zinc oxide, magnesium hydroxide, morphine injection, ondansetron  **OR** ondansetron (ZOFRAN) IV, oxyCODONE-acetaminophen, promethazine, traZODone  Family History: family history includes Non-Hodgkin's lymphoma (age of onset: 34) in her father; Pancreatic cancer (age of onset: 26) in her maternal grandmother; Throat cancer (age of onset: 91) in her paternal grandfather; Thyroid cancer (age of onset: 36) in her maternal grandmother.  The patient's family history is negative for inflammatory bowel disorders, GI malignancy, or solid organ transplantation.  Social History:   reports that she has never smoked. She has never used smokeless tobacco. She reports current alcohol use. She reports that she does not use drugs. The patient denies ETOH, tobacco, or drug use.   Review of Systems: Constitutional: +20-30 lb unintentional weight loss Eyes: No changes in vision. ENT: No oral lesions, sore throat.  GI: see HPI.  Heme/Lymph: No easy bruising.  CV: No chest pain.  GU: No hematuria.  Integumentary: No rashes.  Neuro: No headaches.  Psych: No depression/anxiety.  Endocrine: No heat/cold intolerance.  Allergic/Immunologic: No urticaria.  Resp: No cough, SOB.  Musculoskeletal: No joint swelling.    Physical Examination: BP (!) 120/91 (BP Location: Right Arm)   Pulse 88   Temp 97.7 F (36.5 C) (Oral)   Resp 20   Ht 5' 11.5" (1.816 m)   Wt 99.8 kg   LMP 01/18/2019 (Approximate) Comment: neg preg  SpO2 98%   BMI 30.26 kg/m  Gen: NAD, alert and oriented x 4, appears slightly anxious and depressed mood HEENT: PEERLA, EOMI, Neck: supple, no JVD or thyromegaly Chest: CTA bilaterally, no wheezes, crackles, or other adventitious sounds CV: RRR, no m/g/c/r Abd: soft, mild ttp in epigastric and LLQ, ND, +BS in all four quadrants; no HSM, guarding, ridigity, or rebound tenderness Ext: no edema, well perfused with 2+ pulses, Skin: no rash or lesions noted Lymph: no LAD  Data: Lab Results  Component Value Date   WBC 9.3 02/16/2019   HGB 11.3 (L)  02/16/2019   HCT 32.9 (L) 02/16/2019   MCV 85.2 02/16/2019   PLT 396 02/16/2019   Recent Labs  Lab 02/14/19 0422 02/15/19 0350 02/16/19 0435  HGB 10.6* 10.8* 11.3*   Lab Results  Component Value Date   NA 137 02/16/2019   K 3.9 02/16/2019   CL 105 02/16/2019   CO2 22 02/16/2019   BUN 9 02/16/2019   CREATININE 0.77 02/16/2019   Lab Results  Component Value Date   ALT 22 02/12/2019   AST 26 02/12/2019   ALKPHOS 69 02/12/2019   BILITOT 0.8 02/12/2019   No results for input(s): APTT, INR, PTT in the last 168  hours.   EGD/Colon 12/2018 Path: DIAGNOSIS:  A. DUODENUM; COLD BIOPSY:  - ENTERIC MUCOSA WITH PRESERVED VILLOUS ARCHITECTURE AND NO SIGNIFICANT HISTOPATHOLOGIC CHANGE.  - NEGATIVE FOR FEATURES OF CELIAC, DYSPLASIA, AND MALIGNANCY.   B. STOMACH; COLD BIOPSY:  - GASTRIC ANTRAL AND OXYNTIC MUCOSA WITH NO SIGNIFICANT HISTOPATHOLOGIC CHANGE.  - NEGATIVE FOR H. PYLORI, DYSPLASIA, AND MALIGNANCY.   C. COLON, RANDOM; COLD BIOPSY:  - BENIGN COLONIC MUCOSA WITH NO SIGNIFICANT HISTOPATHOLOGIC CHANGE.  - NEGATIVE FOR FEATURES OF MICROSCOPIC COLITIS.  - NEGATIVE FOR DYSPLASIA AND MALIGNANCY.  Assessment/Plan:  33 y/o Caucasian female with a PMH of HTN, anxiety admitted for c diff colitis  1. Clostridium difficile colitis 2. Hyponatremia - replaced by hospital team 3. Epigastric/LLQ abd pain - likely 2/2 #1 or concomitant functional GI disorder  - Patient was diagnosed with her first episode of c diff colitis on 09/19 likely secondary to recent antibiotic use - She has been on oral vancomycin 125 mg PO for the past 11 days with virtually no improvements in frequency of diarrhea or abd pain - She had EGD and colonoscopy performed by Dr. Marius Ditch appx 6 weeks ago with normal appearing mucosa in her colon with normal appearing TI. Duodenal biopsies and random colon biopsies negative - We need to treat the underlying c diff infection. We will start Dificid 200 mg PO BID and plan to  do this for 10 days - If no clinical improvement over the next 24-48 hours, will likely need flex sig with biopsies - Consider MRE or small bowel capsule endoscopy as next steps in the plan of care - Continue diet as tolerated - Encouraged the patient to ambulate as tolerated - May increase Bentyl to 20 mg and use q6h prn - We will continue to follow along    Thank you for the consult. Please call with questions or concerns.  Reeves Forth [facility] [phone] (Cell)

## 2019-02-16 NOTE — Plan of Care (Signed)
  Problem: Activity: Goal: Risk for activity intolerance will decrease Outcome: Progressing  Pt ambulating in room independent and at will.

## 2019-02-16 NOTE — Progress Notes (Signed)
[place] [facility] [place] NAME: Kristanna Maden    MR#:  [id number]  DATE OF BIRTH:  03-21-1986  SUBJECTIVE:   Patient has had 18 more bowel movements in the last 24 hours.  She states her left lower quadrant abdominal pain has improved from yesterday.  She is feeling a little bit better, but wishes her diarrhea had stopped.  No vomiting.  REVIEW OF SYSTEMS:  Review of Systems  Constitutional: Negative for chills and fever.  HENT: Negative for congestion and sore throat.   Eyes: Negative for blurred vision and double vision.  Respiratory: Negative for cough and shortness of breath.   Cardiovascular: Negative for chest pain and palpitations.  Gastrointestinal: Positive for abdominal pain, diarrhea and nausea. Negative for vomiting.  Genitourinary: Negative for dysuria and urgency.  Musculoskeletal: Negative for back pain and neck pain.  Neurological: Negative for dizziness and headaches.  Psychiatric/Behavioral: Negative for depression. The patient is not nervous/anxious.     DRUG ALLERGIES:   Allergies  Allergen Reactions  . Shellfish Allergy Anaphylaxis  . Aloe Vera Dermatitis   VITALS:  Blood pressure (!) 120/91, pulse 88, temperature 97.7 F (36.5 C), temperature source Oral, resp. rate 20, height 5' 11.5" (1.816 m), weight 99.8 kg, last menstrual period 01/18/2019, SpO2 98 %. PHYSICAL EXAMINATION:  Physical Exam  GENERAL:  Laying in the bed with no acute distress.  HEENT: Head atraumatic, normocephalic. Pupils equal, round, reactive to light and accommodation. No scleral icterus. Extraocular muscles intact. Oropharynx and nasopharynx clear.  NECK:  Supple, no jugular venous distention. No thyroid enlargement. LUNGS: Lungs are clear to auscultation bilaterally. No wheezes, crackles, rhonchi. No use of accessory muscles of respiration.  CARDIOVASCULAR: RRR, S1, S2 normal. No murmurs, rubs, or gallops.  ABDOMEN: Soft, nondistended. Bowel  sounds present. + Mild diffuse tenderness to palpation, no rebound or guarding. EXTREMITIES: No pedal edema, cyanosis, or clubbing.  NEUROLOGIC: CN 2-12 intact, no focal deficits. 5/5 muscle strength throughout all extremities. Sensation intact throughout. Gait not checked.  PSYCHIATRIC: The patient is alert and oriented x 3.  SKIN: No obvious rash, lesion, or ulcer.  LABORATORY PANEL:  Female CBC Recent Labs  Lab 02/16/19 0435  WBC 9.3  HGB 11.3*  HCT 32.9*  PLT 396   ------------------------------------------------------------------------------------------------------------------ Chemistries  Recent Labs  Lab 02/12/19 2151  02/15/19 0350 02/16/19 0435  NA 116*   < > 134* 137  K 2.2*   < > 3.7 3.9  CL 75*   < > 103 105  CO2 22   < > 23 22  GLUCOSE 132*   < > 95 94  BUN 9   < > 9 9  CREATININE 0.79   < > 0.80 0.77  CALCIUM 8.9   < > 8.6* 8.7*  MG 1.5*   < > 1.9  --   AST 26  --   --   --   ALT 22  --   --   --   ALKPHOS 69  --   --   --   BILITOT 0.8  --   --   --    < > = values in this interval not displayed.   RADIOLOGY:  No results found. ASSESSMENT AND PLAN:   C. Diff colitis- patient continues to have persistent diarrhea. -Increased p.o. vancomycin from 150 mg to 500 mg 4 times daily yesterday, with no improvement.  May need to consider switching to Fidaxomycin -GI pathogen panel pending -  Continue IV Flagyl for now -Continue IV fluids -Pain control and IV antiemetics -Due to a lack of improvement in her symptoms, will ask GI to evaluate today  Hyperlipidemia- stable -Continue home statin  Normocytic anemia- hemoglobin has dropped, likely dilutional due to IVFs -Anemia panel unremarkable -Monitor  DVT prophylaxis- lovenox  All the records are reviewed and case discussed with Care Management/Social Worker. Management plans discussed with the patient, family and they are in agreement.  CODE STATUS: Full Code  TOTAL TIME TAKING CARE OF THIS PATIENT:  35 minutes.   More than 50% of the time was spent in counseling/coordination of care: YES  POSSIBLE D/C IN 1-2 DAYS, DEPENDING ON CLINICAL CONDITION.   Berna Spare Mayo M.D on 02/16/2019 at 1:40 PM  Between 7am to 6pm - Pager - [phone]  After 6pm go to [email or web address] - Proofreader  [facility]  Office  [phone]  CC: Primary care physician; Glean Hess, MD  Note: This dictation was prepared with Dragon dictation along with smaller phrase technology. Any transcriptional errors that result from this process are unintentional.

## 2019-02-17 DIAGNOSIS — A0472 Enterocolitis due to Clostridium difficile, not specified as recurrent: Secondary | ICD-10-CM

## 2019-02-17 LAB — GI PATHOGEN PANEL BY PCR, STOOL

## 2019-02-17 LAB — CBC
HCT: 32.2 % — ABNORMAL LOW (ref 36.0–46.0)
Hemoglobin: 11.2 g/dL — ABNORMAL LOW (ref 12.0–15.0)
MCH: 29.3 pg (ref 26.0–34.0)
MCHC: 34.8 g/dL (ref 30.0–36.0)
MCV: 84.3 fL (ref 80.0–100.0)
Platelets: 404 10*3/uL — ABNORMAL HIGH (ref 150–400)
RBC: 3.82 MIL/uL — ABNORMAL LOW (ref 3.87–5.11)
RDW: 12.2 % (ref 11.5–15.5)
WBC: 9.2 10*3/uL (ref 4.0–10.5)
nRBC: 0 % (ref 0.0–0.2)

## 2019-02-17 LAB — BASIC METABOLIC PANEL
Anion gap: 7 (ref 5–15)
BUN: 11 mg/dL (ref 6–20)
CO2: 24 mmol/L (ref 22–32)
Calcium: 8.9 mg/dL (ref 8.9–10.3)
Chloride: 107 mmol/L (ref 98–111)
Creatinine, Ser: 0.7 mg/dL (ref 0.44–1.00)
GFR calc Af Amer: >60 mL/min (ref >60)
GFR calc non Af Amer: >60 mL/min (ref >60)
Glucose, Bld: 105 mg/dL — ABNORMAL HIGH (ref 70–99)
Potassium: 3.7 mmol/L (ref 3.5–5.1)
Sodium: 138 mmol/L (ref 135–145)

## 2019-02-17 MED ORDER — FIDAXOMICIN 200 MG PO TABS
200.0000 mg | ORAL_TABLET | Freq: Two times a day (BID) | ORAL | Status: DC
  Filled 2019-02-17 (×2): qty 1

## 2019-02-17 MED ORDER — FIDAXOMICIN 200 MG PO TABS
200.0000 mg | ORAL_TABLET | Freq: Two times a day (BID) | ORAL | Status: DC
  Administered 2019-02-17: 23:00:00 200 mg via ORAL
  Filled 2019-02-17 (×3): qty 1

## 2019-02-17 MED ORDER — FIDAXOMICIN 200 MG PO TABS: 200.0000 mg | ORAL_TABLET | Freq: Two times a day (BID) | ORAL | 0 refills | Status: DC

## 2019-02-17 NOTE — Progress Notes (Signed)
Molly Darby, MD [address]  [place]  [place], [place] [address]  Main: [phone]  Fax: [phone] Pager: [phone]   Subjective: She continues to have several episodes of nonbloody watery bowel movements.  She reports having about 8 last night and 3 this morning.  Patient is maintaining a stool log and showed it on her phone.  Abdominal pain is better.  Overall, she feels significantly better. Switched from vancomycin to fidaxomicin today   Objective: Vital signs in last 24 hours: Vitals:   02/16/19 0503 02/16/19 1317 02/16/19 2221 02/17/19 0429  BP: 122/82 (!) 120/91 129/87 109/78  Pulse: 86 88 90 85  Resp: 17 20 17 17   Temp: (!) 97.5 F (36.4 C) 97.7 F (36.5 C) 98.2 F (36.8 C) 98 F (36.7 C)  TempSrc: Oral Oral Oral Oral  SpO2: 98% 98% 97% 97%  Weight:      Height:       Weight change:   Intake/Output Summary (Last 24 hours) at 02/17/2019 1307 Last data filed at 02/17/2019 [id number] Gross per 24 hour  Intake 2200 ml  Output -  Net 2200 ml     Exam: Heart:: Regular rate and rhythm, S1S2 present or without murmur or extra heart sounds Lungs: normal and clear to auscultation Abdomen: soft, nontender, normal bowel sounds   Lab Results: CBC Latest Ref Rng & Units 02/17/2019 02/16/2019 02/15/2019  WBC 4.0 - 10.5 K/uL 9.2 9.3 10.6(H)  Hemoglobin 12.0 - 15.0 g/dL 11.2(L) 11.3(L) 10.8(L)  Hematocrit 36.0 - 46.0 % 32.2(L) 32.9(L) 31.5(L)  Platelets 150 - 400 K/uL 404(H) 396 380   CMP Latest Ref Rng & Units 02/17/2019 02/16/2019 02/15/2019  Glucose 70 - 99 mg/dL 105(H) 94 95  BUN 6 - 20 mg/dL 11 9 9   Creatinine 0.44 - 1.00 mg/dL 0.70 0.77 0.80  Sodium 135 - 145 mmol/L 138 137 134(L)  Potassium 3.5 - 5.1 mmol/L 3.7 3.9 3.7  Chloride 98 - 111 mmol/L 107 105 103  CO2 22 - 32 mmol/L 24 22 23   Calcium 8.9 - 10.3 mg/dL 8.9 8.7(L) 8.6(L)  Total Protein 6.5 - 8.1 g/dL - - -  Total Bilirubin 0.3 - 1.2 mg/dL - - -  Alkaline Phos 38 - 126 U/L - - -  AST 15  - 41 U/L - - -  ALT 0 - 44 U/L - - -    Micro Results: Recent Results (from the past 240 hour(s))  SARS Coronavirus 2 [facility] order, Performed in [facility] hospital lab) Nasopharyngeal Nasopharyngeal Swab     Status: None   Collection Time: 02/12/19 11:26 PM   Specimen: Nasopharyngeal Swab  Result Value Ref Range Status   SARS Coronavirus 2 NEGATIVE NEGATIVE Final    Comment: (NOTE) If result is NEGATIVE SARS-CoV-2 target nucleic acids are NOT DETECTED. The SARS-CoV-2 RNA is generally detectable in upper and lower  respiratory specimens during the acute phase of infection. The lowest  concentration of SARS-CoV-2 viral copies this assay can detect is 250  copies / mL. A negative result does not preclude SARS-CoV-2 infection  and should not be used as the sole basis for treatment or other  patient management decisions.  A negative result may occur with  improper specimen collection / handling, submission of specimen other  than nasopharyngeal swab, presence of viral mutation(s) within the  areas targeted by this assay, and inadequate number of viral copies  (<250 copies / mL). A negative result must be combined with clinical  observations, patient history, and epidemiological information. If result is POSITIVE SARS-CoV-2 target nucleic acids are DETECTED. The SARS-CoV-2 RNA is generally detectable in upper and lower  respiratory specimens dur ing the acute phase of infection.  Positive  results are indicative of active infection with SARS-CoV-2.  Clinical  correlation with patient history and other diagnostic information is  necessary to determine patient infection status.  Positive results do  not rule out bacterial infection or co-infection with other viruses. If result is PRESUMPTIVE POSTIVE SARS-CoV-2 nucleic acids MAY BE PRESENT.   A presumptive positive result was obtained on the submitted specimen  and confirmed on repeat testing.  While 2019 novel coronavirus   (SARS-CoV-2) nucleic acids may be present in the submitted sample  additional confirmatory testing may be necessary for epidemiological  and / or clinical management purposes  to differentiate between  SARS-CoV-2 and other Sarbecovirus currently known to infect humans.  If clinically indicated additional testing with an alternate test  methodology [phone]) is advised. The SARS-CoV-2 RNA is generally  detectable in upper and lower respiratory sp ecimens during the acute  phase of infection. The expected result is Negative. Fact Sheet for Patients:  StrictlyIdeas.no Fact Sheet for Healthcare Providers: BankingDealers.co.za This test is not yet approved or cleared by the [place] FDA and has been authorized for detection and/or diagnosis of SARS-CoV-2 by FDA under an Emergency Use Authorization (EUA).  This EUA will remain in effect (meaning this test can be used) for the duration of the COVID-19 declaration under Section 564(b)(1) of the Act, 21 U.S.C. section 360bbb-3(b)(1), unless the authorization is terminated or revoked sooner. Performed at [facility], [address] [place], [place] [address]   Blood culture (routine x 2)     Status: None (Preliminary result)   Collection Time: 02/12/19 11:26 PM   Specimen: BLOOD RIGHT FOREARM  Result Value Ref Range Status   Specimen Description BLOOD RIGHT FOREARM  Final   Special Requests   Final    BOTTLES DRAWN AEROBIC AND ANAEROBIC Blood Culture results may not be optimal due to an excessive volume of blood received in culture bottles   Culture   Final    NO GROWTH 4 DAYS Performed at [facility], [address] [place], [place] [address]    Report Status PENDING  Incomplete  Blood culture (routine x 2)     Status: None (Preliminary result)   Collection Time: 02/12/19 11:26 PM   Specimen: Right Antecubital; Blood  Result Value Ref Range Status   Specimen  Description RIGHT ANTECUBITAL  Final   Special Requests   Final    BOTTLES DRAWN AEROBIC AND ANAEROBIC Blood Culture results may not be optimal due to an excessive volume of blood received in culture bottles   Culture   Final    NO GROWTH 4 DAYS Performed at [facility], [address], [place], [place] [address]    Report Status PENDING  Incomplete   Studies/Results: No results found. Medications:  I have reviewed the patient's current medications. Prior to Admission:  Medications Prior to Admission  Medication Sig Dispense Refill Last Dose  . ALPRAZolam (XANAX) 1 MG tablet Take 1 mg by mouth at bedtime as needed for anxiety.   prn at prn  . amitriptyline (ELAVIL) 75 MG tablet Take 75 mg by mouth at bedtime.    Past Week at Unknown time  . amphetamine-dextroamphetamine (ADDERALL XR) 30 MG 24 hr capsule Take 30 mg by mouth daily.  Past Week at Unknown time  . amphetamine-dextroamphetamine (ADDERALL) 20 MG tablet Take 20 mg by mouth 2 (two) times a day.    Past Week at Unknown time  . atorvastatin (LIPITOR) 20 MG tablet TAKE ONE TABLET BY MOUTH DAILY. (Patient taking differently: Take 20 mg by mouth daily. ) 90 tablet 0 Past Week at Unknown time  . cyclobenzaprine (FLEXERIL) 10 MG tablet Take 10 mg by mouth at bedtime.    Past Week at Unknown time  . dicyclomine (BENTYL) 10 MG capsule TAKE (1) CAPSULE BY MOUTH FOUR TIMES A DAY BEFORE MEALS AND AT BEDTIME. (Patient taking differently: Take 10 mg by mouth 4 (four) times daily. ) 120 capsule 0 Past Week at Unknown time  . DULoxetine (CYMBALTA) 60 MG capsule Take 60 mg by mouth daily.    Past Week at Unknown time  . gabapentin (NEURONTIN) 300 MG capsule TAKE (1) CAPSULE BY MOUTH TWICE DAILY (Patient taking differently: Take 300 mg by mouth 2 (two) times daily. ) 60 capsule 2 Past Week at Unknown time  . hydrOXYzine (VISTARIL) 25 MG capsule TAKE 1-2 CAPSULES BY MOUTH EVERY 6 HOURS. 60 capsule 2 Past Week at Unknown time  .  ondansetron (ZOFRAN) 4 MG tablet Take 1 tablet (4 mg total) by mouth every 8 (eight) hours as needed for nausea or vomiting. 60 tablet 0 prn at prn  . oxyCODONE-acetaminophen (PERCOCET) 5-325 MG tablet Take 1 tablet by mouth every 8 (eight) hours as needed for severe pain. 20 tablet 0 prn at prn  . pantoprazole (PROTONIX) 40 MG tablet Take 40 mg by mouth 2 (two) times daily.   Past Week at Unknown time  . promethazine (PHENERGAN) 25 MG tablet Take 1 tablet (25 mg total) by mouth at bedtime as needed for nausea or vomiting. 30 tablet 0 prn at prn  . valACYclovir (VALTREX) 1000 MG tablet Take 1 tablet (1,000 mg total) by mouth 2 (two) times daily as needed. 60 tablet 1 prn at prn  . [EXPIRED] vancomycin (VANCOCIN) 50 mg/mL oral solution Take 2.5 mLs (125 mg total) by mouth 4 (four) times daily for 6 days. 60 mL 0 Past Week at Unknown time  . etonogestrel-ethinyl estradiol (NUVARING) 0.12-0.015 MG/24HR vaginal ring USE AS DIRECTED BY DOCTOR 3 each 3    Scheduled: . atorvastatin  20 mg Oral Daily  . dicyclomine  10 mg Oral QID  . DULoxetine  60 mg Oral Daily  . enoxaparin (LOVENOX) injection  40 mg Subcutaneous Q24H  . famotidine  20 mg Oral BID  . fidaxomicin  200 mg Oral BID  . gabapentin  300 mg Oral BID   Continuous: . sodium chloride 100 mL/hr at 02/17/19 [id number]   [id number] **OR** acetaminophen, ALPRAZolam, hydrOXYzine, liver oil-zinc oxide, morphine injection, ondansetron **OR** ondansetron (ZOFRAN) IV, oxyCODONE-acetaminophen, promethazine, traZODone Anti-infectives (From admission, onward)   Start     Dose/Rate Route Frequency Ordered Stop   02/17/19 2200  fidaxomicin (DIFICID) tablet 200 mg     200 mg Oral 2 times daily 02/17/19 1212 02/26/19 2159   02/17/19 1215  fidaxomicin (DIFICID) tablet 200 mg  Status:  Discontinued     200 mg Oral 2 times daily 02/17/19 1201 02/17/19 1212   02/17/19 0000  fidaxomicin (DIFICID) 200 MG TABS tablet  Status:  Discontinued     200 mg Oral 2  times daily 02/17/19 1106 02/17/19    02/16/19 2200  fidaxomicin (DIFICID) tablet 200 mg  Status:  Discontinued     200  mg Oral 2 times daily 02/16/19 1758 02/17/19 1202   02/16/19 1000  metroNIDAZOLE (FLAGYL) IVPB 500 mg  Status:  Discontinued     500 mg 100 mL/hr over 60 Minutes Intravenous Every 8 hours 02/16/19 0840 02/16/19 1759   02/15/19 1200  vancomycin (VANCOCIN) 50 mg/mL oral solution 500 mg  Status:  Discontinued     500 mg Oral 4 times daily 02/15/19 1112 02/16/19 1758   02/15/19 1000  metroNIDAZOLE (FLAGYL) IVPB 500 mg  Status:  Discontinued     500 mg 100 mL/hr over 60 Minutes Intravenous Every 8 hours 02/15/19 0855 02/15/19 1112   02/13/19 1000  vancomycin (VANCOCIN) 50 mg/mL oral solution 125 mg  Status:  Discontinued     125 mg Oral 4 times daily 02/13/19 0034 02/13/19 0157   02/13/19 0930  metroNIDAZOLE (FLAGYL) IVPB 500 mg  Status:  Discontinued     500 mg 100 mL/hr over 60 Minutes Intravenous Every 8 hours 02/13/19 0130 02/14/19 1126   02/13/19 0730  vancomycin (VANCOCIN) 50 mg/mL oral solution 125 mg  Status:  Discontinued     125 mg Oral 4 times daily 02/13/19 0034 02/15/19 1112   02/13/19 0000  vancomycin (VANCOCIN) 50 mg/mL oral solution 500 mg  Status:  Discontinued     500 mg Oral Every 6 hours 02/12/19 2359 02/13/19 0157   02/12/19 2345  vancomycin (VANCOCIN) 500 mg in sodium chloride irrigation 0.9 % 100 mL ENEMA     500 mg Rectal  Once 02/12/19 2341 02/13/19 0127   02/12/19 2345  metroNIDAZOLE (FLAGYL) IVPB 500 mg     500 mg 100 mL/hr over 60 Minutes Intravenous  Once 02/12/19 2341 02/13/19 0221     Scheduled Meds: . atorvastatin  20 mg Oral Daily  . dicyclomine  10 mg Oral QID  . DULoxetine  60 mg Oral Daily  . enoxaparin (LOVENOX) injection  40 mg Subcutaneous Q24H  . famotidine  20 mg Oral BID  . fidaxomicin  200 mg Oral BID  . gabapentin  300 mg Oral BID   Continuous Infusions: . sodium chloride 100 mL/hr at 02/17/19 0638   PRN  Meds:.acetaminophen **OR** acetaminophen, ALPRAZolam, hydrOXYzine, liver oil-zinc oxide, morphine injection, ondansetron **OR** ondansetron (ZOFRAN) IV, oxyCODONE-acetaminophen, promethazine, traZODone   Assessment: Active Problems:   Hyponatremia  History of C. difficile diarrhea, did not respond to p.o. Vanco and IV Flagyl Switched to fidaxomicin today Repeat GI pathogen panel pending   Plan: Continue fidaxomicin 200 mg twice daily Electrolytes are normal Recommend further work-up including stool osmolality, stool electrolytes, a.m. cortisol levels, pancreatic fecal elastase Can add Lomotil 2 tabs every 4-6 hours   LOS: 4 days   Molly Cisneros 02/17/2019, 1:07 PM

## 2019-02-17 NOTE — Progress Notes (Signed)
[place] [facility] [place] NAME: Molly Cisneros    MR#:  [id number]  DATE OF BIRTH:  [birth year]  SUBJECTIVE:  Patient seen and evaluated today Has decreased bowel movements than yesterday More formed stool She was started on Dificid No nausea and vomiting No fever no abdominal pain  REVIEW OF SYSTEMS:  Review of Systems  Constitutional: Negative for chills and fever.  HENT: Negative for congestion and sore throat.   Eyes: Negative for blurred vision and double vision.  Respiratory: Negative for cough and shortness of breath.   Cardiovascular: Negative for chest pain and palpitations.  Gastrointestinal: Positive for abdominal pain, diarrhea and nausea. Negative for vomiting.  Genitourinary: Negative for dysuria and urgency.  Musculoskeletal: Negative for back pain and neck pain.  Neurological: Negative for dizziness and headaches.  Psychiatric/Behavioral: Negative for depression. The patient is not nervous/anxious.     DRUG ALLERGIES:   Allergies  Allergen Reactions  . Shellfish Allergy Anaphylaxis  . Aloe Vera Dermatitis   VITALS:  Blood pressure 109/78, pulse 85, temperature 98 F (36.7 C), temperature source Oral, resp. rate 17, height 5' 11.5" (1.816 m), weight 99.8 kg, last menstrual period 01/18/2019, SpO2 97 %. PHYSICAL EXAMINATION:  Physical Exam  GENERAL:  Laying in the bed with no acute distress.  HEENT: Head atraumatic, normocephalic. Pupils equal, round, reactive to light and accommodation. No scleral icterus. Extraocular muscles intact. Oropharynx and nasopharynx clear.  NECK:  Supple, no jugular venous distention. No thyroid enlargement. LUNGS: Lungs are clear to auscultation bilaterally. No wheezes, crackles, rhonchi. No use of accessory muscles of respiration.  CARDIOVASCULAR: RRR, S1, S2 normal. No murmurs, rubs, or gallops.  ABDOMEN: Soft, nondistended. Bowel sounds present. + Mild diffuse tenderness to palpation, no  rebound or guarding. EXTREMITIES: No pedal edema, cyanosis, or clubbing.  NEUROLOGIC: CN 2-12 intact, no focal deficits. 5/5 muscle strength throughout all extremities. Sensation intact throughout. Gait not checked.  PSYCHIATRIC: The patient is alert and oriented x 3.  SKIN: No obvious rash, lesion, or ulcer.  LABORATORY PANEL:  Female CBC Recent Labs  Lab 02/17/19 0329  WBC 9.2  HGB 11.2*  HCT 32.2*  PLT 404*   ------------------------------------------------------------------------------------------------------------------ Chemistries  Recent Labs  Lab 02/12/19 2151  02/15/19 0350  02/17/19 0329  NA 116*   < > 134*   < > 138  K 2.2*   < > 3.7   < > 3.7  CL 75*   < > 103   < > 107  CO2 22   < > 23   < > 24  GLUCOSE 132*   < > 95   < > 105*  BUN 9   < > 9   < > 11  CREATININE 0.79   < > 0.80   < > 0.70  CALCIUM 8.9   < > 8.6*   < > 8.9  MG 1.5*   < > 1.9  --   --   AST 26  --   --   --   --   ALT 22  --   --   --   --   ALKPHOS 69  --   --   --   --   BILITOT 0.8  --   --   --   --    < > = values in this interval not displayed.   RADIOLOGY:  No results found. ASSESSMENT AND PLAN:   -C. Diff colitis- patient continues  to have persistent diarrhea. -started on Fidaxomycin, discontinued flagyl and vancomycin -GI pathogen panel pending -Continue IV fluids -Pain control and IV antiemetics -Due to a lack of improvement in her symptoms, will ask GI to evaluate today  -Hyperlipidemia- stable -Continue home statin  -Normocytic anemia- hemoglobin has dropped, likely dilutional due to IVFs -Anemia panel unremarkable -Monitor  -DVT prophylaxis- lovenox  All the records are reviewed and case discussed with Care Management/Social Worker. Management plans discussed with the patient, family and they are in agreement.  CODE STATUS: Full Code  TOTAL TIME TAKING CARE OF THIS PATIENT: 37 minutes.   More than 50% of the time was spent in counseling/coordination of care:  YES  POSSIBLE D/C IN 1-2 DAYS, DEPENDING ON CLINICAL CONDITION.   Saundra Shelling M.D on 02/17/2019 at 1:07 PM  Between 7am to 6pm - Pager - [phone]  After 6pm go to [email or web address] - Proofreader  [facility]  Office  [phone]  CC: Primary care physician; Glean Hess, MD  Note: This dictation was prepared with Dragon dictation along with smaller phrase technology. Any transcriptional errors that result from this process are unintentional.

## 2019-02-17 NOTE — Progress Notes (Addendum)
Pharmacy - Antimicrobial Stewardship  Patient started on fidaxomicin 200mg  PO BID for C. Difficile not responding to vancomycin PO and metronidazole.  Prescription sent to her pharmacy, pharmacist at community pharmacy communicated that prior authorization required.  Communicated need for prior auth to Drs. Pyreddy and [place].    The prior auth process was initiated via [email or web address].   Awaiting decision from insurance.  Addendum: Insurance responded that fidaxomicin was APPROVED  Doreene Eland, PharmD, BCPS.   Work Cell: [phone] 02/17/2019 2:06 PM

## 2019-02-18 LAB — CULTURE, BLOOD (ROUTINE X 2)
Culture: NO GROWTH
Culture: NO GROWTH

## 2019-02-18 LAB — OSMOLALITY: Osmolality: 278 mOsm/kg (ref 275–295)

## 2019-02-18 LAB — CORTISOL-AM, BLOOD: Cortisol - AM: 2.5 ug/dL — ABNORMAL LOW (ref 6.7–22.6)

## 2019-02-18 MED ORDER — DIPHENOXYLATE-ATROPINE 2.5-0.025 MG PO TABS: 2.0000 | ORAL_TABLET | Freq: Four times a day (QID) | ORAL | Status: DC | PRN

## 2019-02-18 MED ORDER — DIFICID 200 MG PO TABS: 200.0000 mg | ORAL_TABLET | Freq: Two times a day (BID) | ORAL | 0 refills | Status: DC

## 2019-02-18 MED ORDER — OXYCODONE-ACETAMINOPHEN 5-325 MG PO TABS: 1.0000 | ORAL_TABLET | Freq: Four times a day (QID) | ORAL | 0 refills | Status: DC | PRN

## 2019-02-18 MED ORDER — PROMETHAZINE HCL 25 MG PO TABS: 25.0000 mg | ORAL_TABLET | Freq: Every evening | ORAL | 0 refills | Status: DC | PRN

## 2019-02-18 MED ORDER — DIPHENOXYLATE-ATROPINE 2.5-0.025 MG PO TABS: 2.0000 | ORAL_TABLET | Freq: Four times a day (QID) | ORAL | 0 refills | Status: DC | PRN

## 2019-02-18 NOTE — Discharge Summary (Signed)
[facility] [place] NAME: Molly Cisneros    MR#:  [id number]  DATE OF BIRTH:  1985/09/19  DATE OF ADMISSION:  02/12/2019 ADMITTING PHYSICIAN: Christel Mormon, MD  DATE OF DISCHARGE: 02/18/2019  PRIMARY CARE PHYSICIAN: Glean Hess, MD   ADMISSION DIAGNOSIS:  Acute hyponatremia [E87.1] Hypokalemia [E87.6] Enteritis [K52.9] C. difficile colitis [A04.72] Elevated lactic acid level [R79.89] Sepsis, due to unspecified organism, unspecified whether acute organ dysfunction present ([place]) [A41.9]  DISCHARGE DIAGNOSIS:  Active Problems:   Hyponatremia Acute C. difficile colitis Acute hypokalemia Acute hypomagnesemia Hyperlipidemia SECONDARY DIAGNOSIS:   Past Medical History:  Diagnosis Date  . Anxiety   . Clostridium difficile diarrhea   . Hypertension   . Shingles    reoccuring shingles on face     ADMITTING HISTORY Molly Cisneros  is a 33 y.o. Caucasian female with a known history of hypertension, anxiety, herpes zoster and  recent C. difficile colitis for which she was admitted here from 9/19 till 9/23 and discharged on p.o. vancomycin which she has been taking.  She has been having recurrent nausea and vomiting unfortunately since Thursday and denies any bilious vomitus or hematemesis.  She also had associated watery diarrhea without melena or bright red bleeding per rectum and admits to left lower quadrant abdominal pain.  She denied any fever or chills however she has been diaphoretic.  She denies any cough or wheezing or dyspnea or recent sick exposure to COVID-19. Upon presentation to the emergency room, heart rate was 109 with respiratory to 22 and otherwise normal vital signs.  Later blood pressure was up to 135/105.  Labs were remarkable for lactic acid of 3.3 and significant hyponatremia with sodium of 116 down from 139 on 9/23, potassium of 2.2 down from 3.9 then a chloride of 75 down from 108 and.  Magnesium level was 1.5 and CBC showed  leukocytosis with WBC of 13.8 up from 11.9 with absolute neutrophils of 8.9.  Abdominal pelvic CT scan revealed findings suggestive of mild thickening of the jejunal loop in the left lower quadrant with adjacent mesenteric hazy stranding consistent with enteritis that could be infectious or inflammatory with no evidence for perforation or abscess.  The colon was more fluid-filled with some mucosal hyperenhancement consistent with history of diarrhea.  COVID-19 test is currently pending. The patient was given 125 mg p.o. vancomycin, 1 L bolus of IV normal saline, 25 mg of IV Phenergan, 40 mEq p.o. potassium chloride and 10 mEq IV, 4 mg of IV morphine sulfate and 500 g of IV Flagyl.  She will be admitted to a medically monitored bed for further evaluation and management.  HOSPITAL COURSE:  Patient's COVID-19 test was negative.  C. difficile toxin was positive and patient started on oral vancomycin and Flagyl antibiotics.  Patient had increased diarrhea gastroenterology consultation was done.  Patient's electrolytes with magnesium and potassium were aggressively replaced.  Patient was started on oral's Dificid based on gastroenterology recommendation for recurrent C. difficile colitis.  Her diarrhea improved.  She had more formed stools.  With the help of pharmacy medication was procured for outpatient treatment.  Patient will continue oral Lomotil for control of diarrhea.  Patient has history of C. difficile colitis which did not respond to oral vancomycin and IV Flagyl.  Hence she was switched to fidaxomicin.  Will give outpatient gastroenterology appointment in 1 week for further follow-up.  CONSULTS OBTAINED:  Treatment Team:  Lin Landsman, MD  DRUG  ALLERGIES:   Allergies  Allergen Reactions  . Shellfish Allergy Anaphylaxis  . Aloe Vera Dermatitis    DISCHARGE MEDICATIONS:   Allergies as of 02/18/2019      Reactions   Shellfish Allergy Anaphylaxis   Aloe Vera Dermatitis       Medication List    STOP taking these medications   vancomycin 50 mg/mL  oral solution Commonly known as: VANCOCIN     TAKE these medications   ALPRAZolam 1 MG tablet Commonly known as: XANAX Take 1 mg by mouth at bedtime as needed for anxiety.   amitriptyline 75 MG tablet Commonly known as: ELAVIL Take 75 mg by mouth at bedtime.   amphetamine-dextroamphetamine 30 MG 24 hr capsule Commonly known as: ADDERALL XR Take 30 mg by mouth daily. What changed: Another medication with the same name was removed. Continue taking this medication, and follow the directions you see here.   atorvastatin 20 MG tablet Commonly known as: LIPITOR TAKE ONE TABLET BY MOUTH DAILY.   cyclobenzaprine 10 MG tablet Commonly known as: FLEXERIL Take 10 mg by mouth at bedtime.   dicyclomine 10 MG capsule Commonly known as: BENTYL TAKE (1) CAPSULE BY MOUTH FOUR TIMES A DAY BEFORE MEALS AND AT BEDTIME. What changed: See the new instructions.   Dificid 200 MG Tabs tablet Generic drug: fidaxomicin Take 1 tablet (200 mg total) by mouth 2 (two) times daily.   diphenoxylate-atropine 2.5-0.025 MG tablet Commonly known as: LOMOTIL Take 2 tablets by mouth 4 (four) times daily as needed for diarrhea or loose stools.   DULoxetine 60 MG capsule Commonly known as: CYMBALTA Take 60 mg by mouth daily.   etonogestrel-ethinyl estradiol 0.12-0.015 MG/24HR vaginal ring Commonly known as: NuvaRing USE AS DIRECTED BY DOCTOR   gabapentin 300 MG capsule Commonly known as: NEURONTIN TAKE (1) CAPSULE BY MOUTH TWICE DAILY What changed: See the new instructions.   hydrOXYzine 25 MG capsule Commonly known as: VISTARIL TAKE 1-2 CAPSULES BY MOUTH EVERY 6 HOURS.   ondansetron 4 MG tablet Commonly known as: ZOFRAN Take 1 tablet (4 mg total) by mouth every 8 (eight) hours as needed for nausea or vomiting.   oxyCODONE-acetaminophen 5-325 MG tablet Commonly known as: Percocet Take 1 tablet by mouth every 8 (eight)  hours as needed for severe pain.   pantoprazole 40 MG tablet Commonly known as: PROTONIX Take 40 mg by mouth 2 (two) times daily.   promethazine 25 MG tablet Commonly known as: PHENERGAN Take 1 tablet (25 mg total) by mouth at bedtime as needed for nausea or vomiting.   valACYclovir 1000 MG tablet Commonly known as: VALTREX Take 1 tablet (1,000 mg total) by mouth 2 (two) times daily as needed.       Today  Patient seen and evaluated today No abdominal pain No fever Tolerating diet okay More formed stool Hemodynamically stable Charge home with follow-up with GI in clinic in 1 week VITAL SIGNS:  Blood pressure 108/77, pulse 92, temperature 97.9 F (36.6 C), temperature source Oral, resp. rate 17, height 5' 11.5" (1.816 m), weight 99.8 kg, last menstrual period 01/18/2019, SpO2 100 %.  I/O:    Intake/Output Summary (Last 24 hours) at 02/18/2019 1220 Last data filed at 02/17/2019 1728 Gross per 24 hour  Intake 1083.42 ml  Output -  Net 1083.42 ml    PHYSICAL EXAMINATION:  Physical Exam  GENERAL:  33 y.o.-year-old patient lying in the bed with no acute distress.  LUNGS: Normal breath sounds bilaterally, no wheezing, rales,rhonchi or  crepitation. No use of accessory muscles of respiration.  CARDIOVASCULAR: S1, S2 normal. No murmurs, rubs, or gallops.  ABDOMEN: Soft, non-tender, non-distended. Bowel sounds present. No organomegaly or mass.  NEUROLOGIC: Moves all 4 extremities. PSYCHIATRIC: The patient is alert and oriented x 3.  SKIN: No obvious rash, lesion, or ulcer.   DATA REVIEW:   CBC Recent Labs  Lab 02/17/19 0329  WBC 9.2  HGB 11.2*  HCT 32.2*  PLT 404*    Chemistries  Recent Labs  Lab 02/12/19 2151  02/15/19 0350  02/17/19 0329  NA 116*   < > 134*   < > 138  K 2.2*   < > 3.7   < > 3.7  CL 75*   < > 103   < > 107  CO2 22   < > 23   < > 24  GLUCOSE 132*   < > 95   < > 105*  BUN 9   < > 9   < > 11  CREATININE 0.79   < > 0.80   < > 0.70  CALCIUM  8.9   < > 8.6*   < > 8.9  MG 1.5*   < > 1.9  --   --   AST 26  --   --   --   --   ALT 22  --   --   --   --   ALKPHOS 69  --   --   --   --   BILITOT 0.8  --   --   --   --    < > = values in this interval not displayed.    Cardiac Enzymes No results for input(s): TROPONINI in the last 168 hours.  Microbiology Results  Results for orders placed or performed during the hospital encounter of 02/12/19  SARS Coronavirus 2 [facility] order, Performed in [facility] hospital lab) Nasopharyngeal Nasopharyngeal Swab     Status: None   Collection Time: 02/12/19 11:26 PM   Specimen: Nasopharyngeal Swab  Result Value Ref Range Status   SARS Coronavirus 2 NEGATIVE NEGATIVE Final    Comment: (NOTE) If result is NEGATIVE SARS-CoV-2 target nucleic acids are NOT DETECTED. The SARS-CoV-2 RNA is generally detectable in upper and lower  respiratory specimens during the acute phase of infection. The lowest  concentration of SARS-CoV-2 viral copies this assay can detect is 250  copies / mL. A negative result does not preclude SARS-CoV-2 infection  and should not be used as the sole basis for treatment or other  patient management decisions.  A negative result may occur with  improper specimen collection / handling, submission of specimen other  than nasopharyngeal swab, presence of viral mutation(s) within the  areas targeted by this assay, and inadequate number of viral copies  (<250 copies / mL). A negative result must be combined with clinical  observations, patient history, and epidemiological information. If result is POSITIVE SARS-CoV-2 target nucleic acids are DETECTED. The SARS-CoV-2 RNA is generally detectable in upper and lower  respiratory specimens dur ing the acute phase of infection.  Positive  results are indicative of active infection with SARS-CoV-2.  Clinical  correlation with patient history and other diagnostic information is  necessary to determine patient infection status.   Positive results do  not rule out bacterial infection or co-infection with other viruses. If result is PRESUMPTIVE POSTIVE SARS-CoV-2 nucleic acids MAY BE PRESENT.   A presumptive positive result was obtained on the submitted specimen  and confirmed on repeat testing.  While 2019 novel coronavirus  (SARS-CoV-2) nucleic acids may be present in the submitted sample  additional confirmatory testing may be necessary for epidemiological  and / or clinical management purposes  to differentiate between  SARS-CoV-2 and other Sarbecovirus currently known to infect humans.  If clinically indicated additional testing with an alternate test  methodology [phone]) is advised. The SARS-CoV-2 RNA is generally  detectable in upper and lower respiratory sp ecimens during the acute  phase of infection. The expected result is Negative. Fact Sheet for Patients:  StrictlyIdeas.no Fact Sheet for Healthcare Providers: BankingDealers.co.za This test is not yet approved or cleared by the [place] FDA and has been authorized for detection and/or diagnosis of SARS-CoV-2 by FDA under an Emergency Use Authorization (EUA).  This EUA will remain in effect (meaning this test can be used) for the duration of the COVID-19 declaration under Section 564(b)(1) of the Act, 21 U.S.C. section 360bbb-3(b)(1), unless the authorization is terminated or revoked sooner. Performed at [facility], [address] [place], [place] [address]   Blood culture (routine x 2)     Status: None   Collection Time: 02/12/19 11:26 PM   Specimen: BLOOD RIGHT FOREARM  Result Value Ref Range Status   Specimen Description BLOOD RIGHT FOREARM  Final   Special Requests   Final    BOTTLES DRAWN AEROBIC AND ANAEROBIC Blood Culture results may not be optimal due to an excessive volume of blood received in culture bottles   Culture   Final    NO GROWTH 5 DAYS Performed at [facility], [address] [place], [place] [address]    Report Status 02/18/2019 FINAL  Final  Blood culture (routine x 2)     Status: None   Collection Time: 02/12/19 11:26 PM   Specimen: Right Antecubital; Blood  Result Value Ref Range Status   Specimen Description RIGHT ANTECUBITAL  Final   Special Requests   Final    BOTTLES DRAWN AEROBIC AND ANAEROBIC Blood Culture results may not be optimal due to an excessive volume of blood received in culture bottles   Culture   Final    NO GROWTH 5 DAYS Performed at [facility], [address], [place],  [address]    Report Status 02/18/2019 FINAL  Final    RADIOLOGY:  No results found.  Follow up with PCP in 1 week.  Management plans discussed with the patient, family and they are in agreement.  CODE STATUS: Full code    Code Status Orders  (From admission, onward)         Start     Ordered   02/13/19 0033  Full code  Continuous     02/13/19 0034        Code Status History    Date Active Date Inactive Code Status Order ID Comments User Context   02/05/2019 2230 02/09/2019 1747 Full Code [id number]  Lance Coon, MD Inpatient   11/06/2018 1742 11/06/2018 2253 Full Code [id number]  Herbert Pun, MD ED   Advance Care Planning Activity      TOTAL TIME TAKING CARE OF THIS PATIENT ON DAY OF DISCHARGE: more than 35 minutes.   Saundra Shelling M.D on 02/18/2019 at 12:20 PM  Between 7am to 6pm - Pager - [phone]  After 6pm go to [email or web address] - password [email or web address] [facility]  Office  [phone]  CC: Primary care physician; Glean Hess, MD  Note: This  dictation was prepared with Dragon dictation along with smaller phrase technology. Any transcriptional errors that result from this process are unintentional.

## 2019-02-18 NOTE — Progress Notes (Signed)
Molly Cisneros to be D/C'd patient's home per MD order.  Discussed prescriptions and follow up appointments with the patient. Prescriptions given to patient, medication list explained in detail. Pt verbalized understanding.  Allergies as of 02/18/2019       Reactions   Shellfish Allergy Anaphylaxis   Aloe Vera Dermatitis        Medication List     STOP taking these medications    vancomycin 50 mg/mL  oral solution Commonly known as: VANCOCIN       TAKE these medications    ALPRAZolam 1 MG tablet Commonly known as: XANAX Take 1 mg by mouth at bedtime as needed for anxiety.   amitriptyline 75 MG tablet Commonly known as: ELAVIL Take 75 mg by mouth at bedtime.   amphetamine-dextroamphetamine 30 MG 24 hr capsule Commonly known as: ADDERALL XR Take 30 mg by mouth daily. What changed: Another medication with the same name was removed. Continue taking this medication, and follow the directions you see here.   atorvastatin 20 MG tablet Commonly known as: LIPITOR TAKE ONE TABLET BY MOUTH DAILY.   cyclobenzaprine 10 MG tablet Commonly known as: FLEXERIL Take 10 mg by mouth at bedtime.   dicyclomine 10 MG capsule Commonly known as: BENTYL TAKE (1) CAPSULE BY MOUTH FOUR TIMES A DAY BEFORE MEALS AND AT BEDTIME. What changed: See the new instructions.   Dificid 200 MG Tabs tablet Generic drug: fidaxomicin Take 1 tablet (200 mg total) by mouth 2 (two) times daily.   diphenoxylate-atropine 2.5-0.025 MG tablet Commonly known as: LOMOTIL Take 2 tablets by mouth 4 (four) times daily as needed for diarrhea or loose stools.   DULoxetine 60 MG capsule Commonly known as: CYMBALTA Take 60 mg by mouth daily.   etonogestrel-ethinyl estradiol 0.12-0.015 MG/24HR vaginal ring Commonly known as: NuvaRing USE AS DIRECTED BY DOCTOR   gabapentin 300 MG capsule Commonly known as: NEURONTIN TAKE (1) CAPSULE BY MOUTH TWICE DAILY What changed: See the new instructions.   hydrOXYzine 25  MG capsule Commonly known as: VISTARIL TAKE 1-2 CAPSULES BY MOUTH EVERY 6 HOURS.   ondansetron 4 MG tablet Commonly known as: ZOFRAN Take 1 tablet (4 mg total) by mouth every 8 (eight) hours as needed for nausea or vomiting.   oxyCODONE-acetaminophen 5-325 MG tablet Commonly known as: Percocet Take 1 tablet by mouth every 6 (six) hours as needed for moderate pain or severe pain. What changed:  when to take this reasons to take this   pantoprazole 40 MG tablet Commonly known as: PROTONIX Take 40 mg by mouth 2 (two) times daily.   promethazine 25 MG tablet Commonly known as: PHENERGAN Take 1 tablet (25 mg total) by mouth at bedtime as needed for nausea or vomiting.   valACYclovir 1000 MG tablet Commonly known as: VALTREX Take 1 tablet (1,000 mg total) by mouth 2 (two) times daily as needed.        Vitals:   02/17/19 2037 02/18/19 0506  BP: (!) 135/93 108/77  Pulse: (!) 105 92  Resp: 18 17  Temp: 97.6 F (36.4 C) 97.9 F (36.6 C)  SpO2: 100% 100%    Skin clean, dry and intact without evidence of skin break down, no evidence of skin tears noted. IV catheter discontinued intact. Site without signs and symptoms of complications. Dressing and pressure applied. Pt denies pain at this time. No complaints noted.  An After Visit Summary was printed and given to the patient. Patient escorted via [place], and D/C home via  private auto.  [place] A Lania Zawistowski

## 2019-02-18 NOTE — Progress Notes (Addendum)
[name], MD [address]  [place]  [place], [place] [address]  Main: [phone]  Fax: [phone] Pager: [phone]   Subjective: She had 3 BMs that were soft last night, one at 4am. She c/o abdominal pain with nausea this morning, waiting for antiemetic. Had small amounts for breakfast   Objective: Vital signs in last 24 hours: Vitals:   02/17/19 0429 02/17/19 1459 02/17/19 2037 02/18/19 0506  BP: 109/78 125/84 (!) 135/93 108/77  Pulse: 85 (!) 109 (!) 105 92  Resp: 17 20 18 17   Temp: 98 F (36.7 C) 99 F (37.2 C) 97.6 F (36.4 C) 97.9 F (36.6 C)  TempSrc: Oral Oral Oral Oral  SpO2: 97% 100% 100% 100%  Weight:      Height:       Weight change:   Intake/Output Summary (Last 24 hours) at 02/18/2019 1136 Last data filed at 02/17/2019 1728 Gross per 24 hour  Intake 1083.42 ml  Output -  Net 1083.42 ml     Exam: Heart:: Regular rate and rhythm, S1S2 present or without murmur or extra heart sounds Lungs: normal and clear to auscultation Abdomen: soft, nontender, normal bowel sounds   Lab Results: CBC Latest Ref Rng & Units 02/17/2019 02/16/2019 02/15/2019  WBC 4.0 - 10.5 K/uL 9.2 9.3 10.6(H)  Hemoglobin 12.0 - 15.0 g/dL 11.2(L) 11.3(L) 10.8(L)  Hematocrit 36.0 - 46.0 % 32.2(L) 32.9(L) 31.5(L)  Platelets 150 - 400 K/uL 404(H) 396 380   CMP Latest Ref Rng & Units 02/17/2019 02/16/2019 02/15/2019  Glucose 70 - 99 mg/dL 105(H) 94 95  BUN 6 - 20 mg/dL 11 9 9   Creatinine 0.44 - 1.00 mg/dL 0.70 0.77 0.80  Sodium 135 - 145 mmol/L 138 137 134(L)  Potassium 3.5 - 5.1 mmol/L 3.7 3.9 3.7  Chloride 98 - 111 mmol/L 107 105 103  CO2 22 - 32 mmol/L 24 22 23   Calcium 8.9 - 10.3 mg/dL 8.9 8.7(L) 8.6(L)  Total Protein 6.5 - 8.1 g/dL - - -  Total Bilirubin 0.3 - 1.2 mg/dL - - -  Alkaline Phos 38 - 126 U/L - - -  AST 15 - 41 U/L - - -  ALT 0 - 44 U/L - - -    Micro Results: Recent Results (from the past 240 hour(s))  SARS Coronavirus 2 [facility] order,  Performed in [facility] hospital lab) Nasopharyngeal Nasopharyngeal Swab     Status: None   Collection Time: 02/12/19 11:26 PM   Specimen: Nasopharyngeal Swab  Result Value Ref Range Status   SARS Coronavirus 2 NEGATIVE NEGATIVE Final    Comment: (NOTE) If result is NEGATIVE SARS-CoV-2 target nucleic acids are NOT DETECTED. The SARS-CoV-2 RNA is generally detectable in upper and lower  respiratory specimens during the acute phase of infection. The lowest  concentration of SARS-CoV-2 viral copies this assay can detect is 250  copies / mL. A negative result does not preclude SARS-CoV-2 infection  and should not be used as the sole basis for treatment or other  patient management decisions.  A negative result may occur with  improper specimen collection / handling, submission of specimen other  than nasopharyngeal swab, presence of viral mutation(s) within the  areas targeted by this assay, and inadequate number of viral copies  (<250 copies / mL). A negative result must be combined with clinical  observations, patient history, and epidemiological information. If result is POSITIVE SARS-CoV-2 target nucleic acids are DETECTED. The SARS-CoV-2 RNA is generally detectable in  upper and lower  respiratory specimens dur ing the acute phase of infection.  Positive  results are indicative of active infection with SARS-CoV-2.  Clinical  correlation with patient history and other diagnostic information is  necessary to determine patient infection status.  Positive results do  not rule out bacterial infection or co-infection with other viruses. If result is PRESUMPTIVE POSTIVE SARS-CoV-2 nucleic acids MAY BE PRESENT.   A presumptive positive result was obtained on the submitted specimen  and confirmed on repeat testing.  While 2019 novel coronavirus  (SARS-CoV-2) nucleic acids may be present in the submitted sample  additional confirmatory testing may be necessary for epidemiological  and / or  clinical management purposes  to differentiate between  SARS-CoV-2 and other Sarbecovirus currently known to infect humans.  If clinically indicated additional testing with an alternate test  methodology [phone]) is advised. The SARS-CoV-2 RNA is generally  detectable in upper and lower respiratory sp ecimens during the acute  phase of infection. The expected result is Negative. Fact Sheet for Patients:  StrictlyIdeas.no Fact Sheet for Healthcare Providers: BankingDealers.co.za This test is not yet approved or cleared by the [place] FDA and has been authorized for detection and/or diagnosis of SARS-CoV-2 by FDA under an Emergency Use Authorization (EUA).  This EUA will remain in effect (meaning this test can be used) for the duration of the COVID-19 declaration under Section 564(b)(1) of the Act, 21 U.S.C. section 360bbb-3(b)(1), unless the authorization is terminated or revoked sooner. Performed at [facility], [address] [place], [place] [address]   Blood culture (routine x 2)     Status: None   Collection Time: 02/12/19 11:26 PM   Specimen: BLOOD RIGHT FOREARM  Result Value Ref Range Status   Specimen Description BLOOD RIGHT FOREARM  Final   Special Requests   Final    BOTTLES DRAWN AEROBIC AND ANAEROBIC Blood Culture results may not be optimal due to an excessive volume of blood received in culture bottles   Culture   Final    NO GROWTH 5 DAYS Performed at [facility], [address] [place], [place] [address]    Report Status 02/18/2019 FINAL  Final  Blood culture (routine x 2)     Status: None   Collection Time: 02/12/19 11:26 PM   Specimen: Right Antecubital; Blood  Result Value Ref Range Status   Specimen Description RIGHT ANTECUBITAL  Final   Special Requests   Final    BOTTLES DRAWN AEROBIC AND ANAEROBIC Blood Culture results may not be optimal due to an excessive volume of blood received  in culture bottles   Culture   Final    NO GROWTH 5 DAYS Performed at [facility], [address], [place], [place] [address]    Report Status 02/18/2019 FINAL  Final   Studies/Results: No results found. Medications:  I have reviewed the patient's current medications. Prior to Admission:  Medications Prior to Admission  Medication Sig Dispense Refill Last Dose  . ALPRAZolam (XANAX) 1 MG tablet Take 1 mg by mouth at bedtime as needed for anxiety.   prn at prn  . amitriptyline (ELAVIL) 75 MG tablet Take 75 mg by mouth at bedtime.    Past Week at Unknown time  . amphetamine-dextroamphetamine (ADDERALL XR) 30 MG 24 hr capsule Take 30 mg by mouth daily.    Past Week at Unknown time  . amphetamine-dextroamphetamine (ADDERALL) 20 MG tablet Take 20 mg by mouth 2 (two) times a day.  Past Week at Unknown time  . atorvastatin (LIPITOR) 20 MG tablet TAKE ONE TABLET BY MOUTH DAILY. (Patient taking differently: Take 20 mg by mouth daily. ) 90 tablet 0 Past Week at Unknown time  . cyclobenzaprine (FLEXERIL) 10 MG tablet Take 10 mg by mouth at bedtime.    Past Week at Unknown time  . dicyclomine (BENTYL) 10 MG capsule TAKE (1) CAPSULE BY MOUTH FOUR TIMES A DAY BEFORE MEALS AND AT BEDTIME. (Patient taking differently: Take 10 mg by mouth 4 (four) times daily. ) 120 capsule 0 Past Week at Unknown time  . DULoxetine (CYMBALTA) 60 MG capsule Take 60 mg by mouth daily.    Past Week at Unknown time  . gabapentin (NEURONTIN) 300 MG capsule TAKE (1) CAPSULE BY MOUTH TWICE DAILY (Patient taking differently: Take 300 mg by mouth 2 (two) times daily. ) 60 capsule 2 Past Week at Unknown time  . hydrOXYzine (VISTARIL) 25 MG capsule TAKE 1-2 CAPSULES BY MOUTH EVERY 6 HOURS. 60 capsule 2 Past Week at Unknown time  . ondansetron (ZOFRAN) 4 MG tablet Take 1 tablet (4 mg total) by mouth every 8 (eight) hours as needed for nausea or vomiting. 60 tablet 0 prn at prn  . oxyCODONE-acetaminophen (PERCOCET) 5-325 MG  tablet Take 1 tablet by mouth every 8 (eight) hours as needed for severe pain. 20 tablet 0 prn at prn  . pantoprazole (PROTONIX) 40 MG tablet Take 40 mg by mouth 2 (two) times daily.   Past Week at Unknown time  . promethazine (PHENERGAN) 25 MG tablet Take 1 tablet (25 mg total) by mouth at bedtime as needed for nausea or vomiting. 30 tablet 0 prn at prn  . valACYclovir (VALTREX) 1000 MG tablet Take 1 tablet (1,000 mg total) by mouth 2 (two) times daily as needed. 60 tablet 1 prn at prn  . [EXPIRED] vancomycin (VANCOCIN) 50 mg/mL oral solution Take 2.5 mLs (125 mg total) by mouth 4 (four) times daily for 6 days. 60 mL 0 Past Week at Unknown time  . etonogestrel-ethinyl estradiol (NUVARING) 0.12-0.015 MG/24HR vaginal ring USE AS DIRECTED BY DOCTOR 3 each 3    Scheduled: . atorvastatin  20 mg Oral Daily  . dicyclomine  10 mg Oral QID  . DULoxetine  60 mg Oral Daily  . enoxaparin (LOVENOX) injection  40 mg Subcutaneous Q24H  . famotidine  20 mg Oral BID  . fidaxomicin  200 mg Oral BID  . gabapentin  300 mg Oral BID   Continuous:  [id number] **OR** acetaminophen, ALPRAZolam, diphenoxylate-atropine, hydrOXYzine, liver oil-zinc oxide, morphine injection, ondansetron **OR** ondansetron (ZOFRAN) IV, oxyCODONE-acetaminophen, promethazine, traZODone Anti-infectives (From admission, onward)   Start     Dose/Rate Route Frequency Ordered Stop   02/18/19 0000  fidaxomicin (DIFICID) 200 MG TABS tablet     200 mg Oral 2 times daily 02/18/19 0839     02/17/19 2200  fidaxomicin (DIFICID) tablet 200 mg     200 mg Oral 2 times daily 02/17/19 1212 02/26/19 2159   02/17/19 1215  fidaxomicin (DIFICID) tablet 200 mg  Status:  Discontinued     200 mg Oral 2 times daily 02/17/19 1201 02/17/19 1212   02/17/19 0000  fidaxomicin (DIFICID) 200 MG TABS tablet  Status:  Discontinued     200 mg Oral 2 times daily 02/17/19 1106 02/17/19    02/16/19 2200  fidaxomicin (DIFICID) tablet 200 mg  Status:  Discontinued      200 mg Oral 2 times daily 02/16/19 1758 02/17/19  1202   02/16/19 1000  metroNIDAZOLE (FLAGYL) IVPB 500 mg  Status:  Discontinued     500 mg 100 mL/hr over 60 Minutes Intravenous Every 8 hours 02/16/19 0840 02/16/19 1759   02/15/19 1200  vancomycin (VANCOCIN) 50 mg/mL oral solution 500 mg  Status:  Discontinued     500 mg Oral 4 times daily 02/15/19 1112 02/16/19 1758   02/15/19 1000  metroNIDAZOLE (FLAGYL) IVPB 500 mg  Status:  Discontinued     500 mg 100 mL/hr over 60 Minutes Intravenous Every 8 hours 02/15/19 0855 02/15/19 1112   02/13/19 1000  vancomycin (VANCOCIN) 50 mg/mL oral solution 125 mg  Status:  Discontinued     125 mg Oral 4 times daily 02/13/19 0034 02/13/19 0157   02/13/19 0930  metroNIDAZOLE (FLAGYL) IVPB 500 mg  Status:  Discontinued     500 mg 100 mL/hr over 60 Minutes Intravenous Every 8 hours 02/13/19 0130 02/14/19 1126   02/13/19 0730  vancomycin (VANCOCIN) 50 mg/mL oral solution 125 mg  Status:  Discontinued     125 mg Oral 4 times daily 02/13/19 0034 02/15/19 1112   02/13/19 0000  vancomycin (VANCOCIN) 50 mg/mL oral solution 500 mg  Status:  Discontinued     500 mg Oral Every 6 hours 02/12/19 2359 02/13/19 0157   02/12/19 2345  vancomycin (VANCOCIN) 500 mg in sodium chloride irrigation 0.9 % 100 mL ENEMA     500 mg Rectal  Once 02/12/19 2341 02/13/19 0127   02/12/19 2345  metroNIDAZOLE (FLAGYL) IVPB 500 mg     500 mg 100 mL/hr over 60 Minutes Intravenous  Once 02/12/19 2341 02/13/19 0221     Scheduled Meds: . atorvastatin  20 mg Oral Daily  . dicyclomine  10 mg Oral QID  . DULoxetine  60 mg Oral Daily  . enoxaparin (LOVENOX) injection  40 mg Subcutaneous Q24H  . famotidine  20 mg Oral BID  . fidaxomicin  200 mg Oral BID  . gabapentin  300 mg Oral BID   Continuous Infusions:  PRN Meds:.acetaminophen **OR** acetaminophen, ALPRAZolam, diphenoxylate-atropine, hydrOXYzine, liver oil-zinc oxide, morphine injection, ondansetron **OR** ondansetron (ZOFRAN) IV,  oxyCODONE-acetaminophen, promethazine, traZODone   Assessment: Active Problems:   Hyponatremia  History of C. difficile diarrhea, did not respond to p.o. Vanco and IV Flagyl Switched to fidaxomicin due to severe diarrhea Repeat GI pathogen panel is negative   Plan: Continue fidaxomicin 200 mg twice daily for 14days followed by 1 pill daily until seen in my clinic Electrolytes are normal Further work-up including stool osmolality, stool electrolytes, a.m. cortisol levels, pancreatic fecal elastase are in process Can add Lomotil 2 tabs every 4-6 hours upon discharge Likely home today   LOS: 5 days     02/18/2019, 11:36 AM

## 2019-02-18 NOTE — Progress Notes (Signed)
Pharmacy - Antimicrobial Stewardship  Patient started on fidaxomicin 200mg  PO BID for C. Difficile not responding to vancomycin PO and metronidazole.   10/1: Prescription sent to her pharmacy, pharmacist at community pharmacy communicated that prior authorization required. Initiated prior auth process.  Insurance responded that fidaxomicin was APPROVED  10/2: able to locate supply of dificid at [facility] located at [address] in [place]. Prescription sent and cost is $0.  Informed patient of where to pick-up and cost.     Doreene Eland, PharmD, BCPS.   Work Cell: [phone] 02/18/2019 9:08 AM

## 2019-02-21 LAB — IMMUNOGLOBULINS A/E/G/M, SERUM
IgA: 160 mg/dL (ref 87–352)
IgE (Immunoglobulin E), Serum: 6 IU/mL (ref 6–495)
IgG (Immunoglobin G), Serum: 839 mg/dL (ref 586–1602)
IgM (Immunoglobulin M), Srm: 64 mg/dL (ref 26–217)

## 2019-02-22 LAB — OSMOLALITY, STOOL

## 2019-02-23 ENCOUNTER — Other Ambulatory Visit: Payer: Self-pay | Admitting: Gastroenterology

## 2019-02-23 ENCOUNTER — Encounter: Payer: Self-pay | Admitting: Gastroenterology

## 2019-02-23 DIAGNOSIS — R7989 Other specified abnormal findings of blood chemistry: Secondary | ICD-10-CM

## 2019-02-23 DIAGNOSIS — E2740 Unspecified adrenocortical insufficiency: Secondary | ICD-10-CM

## 2019-02-23 MED ORDER — HYDROCORTISONE 5 MG PO TABS: 5.0000 mg | ORAL_TABLET | Freq: Three times a day (TID) | ORAL | 2 refills | Status: AC

## 2019-02-24 ENCOUNTER — Telehealth: Payer: Self-pay

## 2019-02-24 DIAGNOSIS — E2740 Unspecified adrenocortical insufficiency: Secondary | ICD-10-CM

## 2019-02-24 DIAGNOSIS — R7989 Other specified abnormal findings of blood chemistry: Secondary | ICD-10-CM

## 2019-02-24 NOTE — Telephone Encounter (Signed)
-----   Message from Lin Landsman, MD sent at 02/23/2019  5:10 PM EDT ----- Regarding: Urgent referral to endocrinology Please place urgent referral to [facility] clinic endocrinology Dx: Evaluate for adrenal insufficiency, low serum cortisol levels  Thanks Rohini

## 2019-02-24 NOTE — Telephone Encounter (Signed)
Put in referral to Endocrinology for Urgent.

## 2019-02-28 LAB — PANCREATIC ELASTASE, FECAL: Pancreatic Elastase-1, Stool: >500 ug Elast./g (ref >200)

## 2019-03-01 ENCOUNTER — Other Ambulatory Visit: Payer: Self-pay

## 2019-03-01 DIAGNOSIS — R197 Diarrhea, unspecified: Secondary | ICD-10-CM

## 2019-03-01 DIAGNOSIS — R7989 Other specified abnormal findings of blood chemistry: Secondary | ICD-10-CM

## 2019-03-01 NOTE — Progress Notes (Signed)
c 

## 2019-03-02 ENCOUNTER — Telehealth: Payer: Self-pay | Admitting: Family Medicine

## 2019-03-02 ENCOUNTER — Other Ambulatory Visit: Payer: Self-pay

## 2019-03-02 ENCOUNTER — Other Ambulatory Visit
Admission: RE | Admit: 2019-03-02 | Discharge: 2019-03-02 | Disposition: A | Discharge status: Home / Self Care | Payer: PRIVATE HEALTH INSURANCE | Attending: Internal Medicine | Admitting: Internal Medicine

## 2019-03-02 DIAGNOSIS — R7989 Other specified abnormal findings of blood chemistry: Secondary | ICD-10-CM | POA: Diagnosis not present

## 2019-03-02 DIAGNOSIS — R197 Diarrhea, unspecified: Secondary | ICD-10-CM | POA: Diagnosis present

## 2019-03-02 LAB — RENAL FUNCTION PANEL
Albumin: 4.2 g/dL (ref 3.5–5.0)
Anion gap: 10 (ref 5–15)
BUN: 13 mg/dL (ref 6–20)
CO2: 20 mmol/L — ABNORMAL LOW (ref 22–32)
Calcium: 9.1 mg/dL (ref 8.9–10.3)
Chloride: 104 mmol/L (ref 98–111)
Creatinine, Ser: 0.84 mg/dL (ref 0.44–1.00)
GFR calc Af Amer: >60 mL/min (ref >60)
GFR calc non Af Amer: >60 mL/min (ref >60)
Glucose, Bld: 97 mg/dL (ref 70–99)
Phosphorus: 3.3 mg/dL (ref 2.5–4.6)
Potassium: 3.9 mmol/L (ref 3.5–5.1)
Sodium: 134 mmol/L — ABNORMAL LOW (ref 135–145)

## 2019-03-02 LAB — CBC WITH DIFFERENTIAL/PLATELET
Abs Immature Granulocytes: 0.03 10*3/uL (ref 0.00–0.07)
Basophils Absolute: 0.1 10*3/uL (ref 0.0–0.1)
Basophils Relative: 1 %
Eosinophils Absolute: 0.1 10*3/uL (ref 0.0–0.5)
Eosinophils Relative: 1 %
HCT: 38 % (ref 36.0–46.0)
Hemoglobin: 13.1 g/dL (ref 12.0–15.0)
Immature Granulocytes: 0 %
Lymphocytes Relative: 28 %
Lymphs Abs: 2.7 10*3/uL (ref 0.7–4.0)
MCH: 29.2 pg (ref 26.0–34.0)
MCHC: 34.5 g/dL (ref 30.0–36.0)
MCV: 84.8 fL (ref 80.0–100.0)
Monocytes Absolute: 0.5 10*3/uL (ref 0.1–1.0)
Monocytes Relative: 5 %
Neutro Abs: 6.2 10*3/uL (ref 1.7–7.7)
Neutrophils Relative %: 65 %
Platelets: 459 10*3/uL — ABNORMAL HIGH (ref 150–400)
RBC: 4.48 MIL/uL (ref 3.87–5.11)
RDW: 13.2 % (ref 11.5–15.5)
WBC: 9.6 10*3/uL (ref 4.0–10.5)
nRBC: 0 % (ref 0.0–0.2)

## 2019-03-02 LAB — LACTIC ACID, PLASMA: Lactic Acid, Venous: 2 mmol/L (ref 0.5–1.9)

## 2019-03-02 NOTE — Telephone Encounter (Signed)
Called by On call nurse for a lactic acid of 2.0

## 2019-03-03 ENCOUNTER — Other Ambulatory Visit: Payer: Self-pay | Admitting: Gastroenterology

## 2019-03-03 ENCOUNTER — Other Ambulatory Visit: Payer: Self-pay | Admitting: Family Medicine

## 2019-03-03 DIAGNOSIS — K529 Noninfective gastroenteritis and colitis, unspecified: Secondary | ICD-10-CM

## 2019-03-03 NOTE — Telephone Encounter (Signed)
Dr. Marius Ditch are these refills appropriate to refill at this time, please advise

## 2019-03-09 ENCOUNTER — Ambulatory Visit: Payer: PRIVATE HEALTH INSURANCE | Admitting: Internal Medicine

## 2019-03-22 ENCOUNTER — Other Ambulatory Visit: Payer: Self-pay | Admitting: Internal Medicine

## 2019-03-22 ENCOUNTER — Encounter: Payer: Self-pay | Admitting: Gastroenterology

## 2019-03-22 ENCOUNTER — Other Ambulatory Visit: Payer: Self-pay

## 2019-03-22 ENCOUNTER — Ambulatory Visit (INDEPENDENT_AMBULATORY_CARE_PROVIDER_SITE_OTHER): Payer: PRIVATE HEALTH INSURANCE | Admitting: Gastroenterology

## 2019-03-22 VITALS — BP 144/91 | HR 97 | Temp 98.6°F | Resp 17 | Ht 72.0 in | Wt 228.2 lb

## 2019-03-22 DIAGNOSIS — K529 Noninfective gastroenteritis and colitis, unspecified: Secondary | ICD-10-CM | POA: Diagnosis not present

## 2019-03-22 DIAGNOSIS — E2740 Unspecified adrenocortical insufficiency: Secondary | ICD-10-CM

## 2019-03-22 DIAGNOSIS — I10 Essential (primary) hypertension: Secondary | ICD-10-CM

## 2019-03-22 DIAGNOSIS — R7989 Other specified abnormal findings of blood chemistry: Secondary | ICD-10-CM

## 2019-03-22 MED ORDER — LISINOPRIL 20 MG PO TABS: 20.0000 mg | ORAL_TABLET | Freq: Every day | ORAL | 3 refills | Status: DC
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
## 2019-04-19 ENCOUNTER — Other Ambulatory Visit: Payer: Self-pay | Admitting: Internal Medicine

## 2019-04-26 ENCOUNTER — Other Ambulatory Visit
Admission: RE | Admit: 2019-04-26 | Discharge: 2019-04-26 | Disposition: A | Discharge status: Home / Self Care | Payer: PRIVATE HEALTH INSURANCE | Attending: Internal Medicine | Admitting: Internal Medicine

## 2019-04-26 ENCOUNTER — Other Ambulatory Visit: Payer: Self-pay

## 2019-04-26 DIAGNOSIS — K921 Melena: Secondary | ICD-10-CM | POA: Diagnosis not present

## 2019-04-26 LAB — CBC
HCT: 39.9 % (ref 36.0–46.0)
Hemoglobin: 13.6 g/dL (ref 12.0–15.0)
MCH: 29 pg (ref 26.0–34.0)
MCHC: 34.1 g/dL (ref 30.0–36.0)
MCV: 85.1 fL (ref 80.0–100.0)
Platelets: 428 10*3/uL — ABNORMAL HIGH (ref 150–400)
RBC: 4.69 MIL/uL (ref 3.87–5.11)
RDW: 13 % (ref 11.5–15.5)
WBC: 9.9 10*3/uL (ref 4.0–10.5)
nRBC: 0 % (ref 0.0–0.2)

## 2019-04-26 LAB — RENAL FUNCTION PANEL
Albumin: 3.9 g/dL (ref 3.5–5.0)
Anion gap: 7 (ref 5–15)
BUN: 11 mg/dL (ref 6–20)
CO2: 19 mmol/L — ABNORMAL LOW (ref 22–32)
Calcium: 8.7 mg/dL — ABNORMAL LOW (ref 8.9–10.3)
Chloride: 108 mmol/L (ref 98–111)
Creatinine, Ser: 0.7 mg/dL (ref 0.44–1.00)
GFR calc Af Amer: >60 mL/min (ref >60)
GFR calc non Af Amer: >60 mL/min (ref >60)
Glucose, Bld: 103 mg/dL — ABNORMAL HIGH (ref 70–99)
Phosphorus: 3.6 mg/dL (ref 2.5–4.6)
Potassium: 4.2 mmol/L (ref 3.5–5.1)
Sodium: 134 mmol/L — ABNORMAL LOW (ref 135–145)

## 2019-04-28 ENCOUNTER — Other Ambulatory Visit: Payer: Self-pay

## 2019-04-28 ENCOUNTER — Telehealth: Payer: Self-pay

## 2019-04-28 MED ORDER — PROMETHAZINE HCL 25 MG PO TABS: 25.0000 mg | ORAL_TABLET | Freq: Every evening | ORAL | 1 refills | Status: DC | PRN

## 2019-04-28 NOTE — Telephone Encounter (Signed)
Pt wants phenergan sent in

## 2019-04-28 NOTE — Telephone Encounter (Signed)
Sent in medication to [facility] in [place].  Thank you.

## 2019-05-04 ENCOUNTER — Other Ambulatory Visit: Payer: Self-pay

## 2019-05-04 ENCOUNTER — Other Ambulatory Visit: Payer: Self-pay | Admitting: Gastroenterology

## 2019-05-04 ENCOUNTER — Encounter: Payer: Self-pay | Admitting: Gastroenterology

## 2019-05-04 ENCOUNTER — Other Ambulatory Visit: Payer: Self-pay | Admitting: Internal Medicine

## 2019-05-04 DIAGNOSIS — K529 Noninfective gastroenteritis and colitis, unspecified: Secondary | ICD-10-CM

## 2019-05-04 DIAGNOSIS — R7989 Other specified abnormal findings of blood chemistry: Secondary | ICD-10-CM

## 2019-05-04 DIAGNOSIS — E2740 Unspecified adrenocortical insufficiency: Secondary | ICD-10-CM

## 2019-05-04 DIAGNOSIS — R110 Nausea: Secondary | ICD-10-CM

## 2019-05-04 MED ORDER — ONDANSETRON 8 MG PO TBDP: 8.0000 mg | ORAL_TABLET | Freq: Once | ORAL | Status: DC

## 2019-05-05 ENCOUNTER — Other Ambulatory Visit: Payer: Self-pay

## 2019-05-05 MED ORDER — ONDANSETRON HCL 8 MG PO TABS: 4.0000 mg | ORAL_TABLET | Freq: Three times a day (TID) | ORAL | 1 refills | Status: DC | PRN

## 2019-05-11 LAB — SPECIMEN STATUS REPORT

## 2019-05-11 LAB — CLOSTRIDIUM DIFFICILE EIA: C difficile Toxins A+B, EIA: NEGATIVE

## 2019-05-12 ENCOUNTER — Other Ambulatory Visit: Payer: Self-pay

## 2019-05-12 ENCOUNTER — Encounter: Payer: Self-pay | Admitting: Intensive Care

## 2019-05-12 ENCOUNTER — Emergency Department
Admission: EM | Admit: 2019-05-12 | Discharge: 2019-05-16 | Disposition: A | Discharge status: Home / Self Care | Payer: PRIVATE HEALTH INSURANCE | Attending: Emergency Medicine | Admitting: Emergency Medicine

## 2019-05-12 DIAGNOSIS — Z5321 Procedure and treatment not carried out due to patient leaving prior to being seen by health care provider: Secondary | ICD-10-CM | POA: Insufficient documentation

## 2019-05-12 DIAGNOSIS — R197 Diarrhea, unspecified: Secondary | ICD-10-CM | POA: Insufficient documentation

## 2019-05-12 DIAGNOSIS — R109 Unspecified abdominal pain: Secondary | ICD-10-CM | POA: Insufficient documentation

## 2019-05-12 DIAGNOSIS — R110 Nausea: Secondary | ICD-10-CM | POA: Diagnosis not present

## 2019-05-12 HISTORY — DX: Pure hypercholesterolemia, unspecified: E78.00

## 2019-05-12 LAB — COMPREHENSIVE METABOLIC PANEL
ALT: 20 U/L (ref 0–44)
AST: 22 U/L (ref 15–41)
Albumin: 4.4 g/dL (ref 3.5–5.0)
Alkaline Phosphatase: 65 U/L (ref 38–126)
Anion gap: 12 (ref 5–15)
BUN: 8 mg/dL (ref 6–20)
CO2: 18 mmol/L — ABNORMAL LOW (ref 22–32)
Calcium: 9.5 mg/dL (ref 8.9–10.3)
Chloride: 103 mmol/L (ref 98–111)
Creatinine, Ser: 1.16 mg/dL — ABNORMAL HIGH (ref 0.44–1.00)
GFR calc Af Amer: >60 mL/min (ref >60)
GFR calc non Af Amer: >60 mL/min (ref >60)
Glucose, Bld: 123 mg/dL — ABNORMAL HIGH (ref 70–99)
Potassium: 2.8 mmol/L — ABNORMAL LOW (ref 3.5–5.1)
Sodium: 133 mmol/L — ABNORMAL LOW (ref 135–145)
Total Bilirubin: 0.3 mg/dL (ref 0.3–1.2)
Total Protein: 8.3 g/dL — ABNORMAL HIGH (ref 6.5–8.1)

## 2019-05-12 LAB — CBC
HCT: 42.4 % (ref 36.0–46.0)
Hemoglobin: 14.8 g/dL (ref 12.0–15.0)
MCH: 28.6 pg (ref 26.0–34.0)
MCHC: 34.9 g/dL (ref 30.0–36.0)
MCV: 82 fL (ref 80.0–100.0)
Platelets: 561 10*3/uL — ABNORMAL HIGH (ref 150–400)
RBC: 5.17 MIL/uL — ABNORMAL HIGH (ref 3.87–5.11)
RDW: 12.7 % (ref 11.5–15.5)
WBC: 14.2 10*3/uL — ABNORMAL HIGH (ref 4.0–10.5)
nRBC: 0 % (ref 0.0–0.2)

## 2019-05-12 LAB — URINALYSIS, COMPLETE (UACMP) WITH MICROSCOPIC
Bilirubin Urine: NEGATIVE
Glucose, UA: NEGATIVE mg/dL
Hgb urine dipstick: NEGATIVE
Ketones, ur: NEGATIVE mg/dL
Leukocytes,Ua: NEGATIVE
Nitrite: NEGATIVE
Protein, ur: NEGATIVE mg/dL
Specific Gravity, Urine: 1.006 (ref 1.005–1.030)
pH: 6 (ref 5.0–8.0)

## 2019-05-12 LAB — POCT PREGNANCY, URINE: Preg Test, Ur: NEGATIVE

## 2019-05-12 LAB — LIPASE, BLOOD: Lipase: 31 U/L (ref 11–51)

## 2019-05-12 NOTE — ED Triage Notes (Signed)
Patient presents with worsening diarrhea X7 days, abd pain, and nausea. Reports diarrhea is an ongoing problem X9 months but has worsened this past week. States she was admitted in October due to diarrhea and low potassium and sodium

## 2019-05-12 NOTE — ED Notes (Signed)
Called pt several times, walked around waiting room no answer

## 2019-05-15 LAB — STOOL CULTURE: E coli, Shiga toxin Assay: NEGATIVE

## 2019-05-15 LAB — CLOSTRIDIUM DIFFICILE EIA: C difficile Toxins A+B, EIA: NEGATIVE

## 2019-05-16 ENCOUNTER — Other Ambulatory Visit: Payer: Self-pay

## 2019-05-16 ENCOUNTER — Other Ambulatory Visit: Payer: Self-pay | Admitting: Internal Medicine

## 2019-05-16 MED ORDER — DICYCLOMINE HCL 20 MG PO TABS: 20.0000 mg | ORAL_TABLET | Freq: Three times a day (TID) | ORAL | 3 refills | Status: DC

## 2019-05-17 ENCOUNTER — Encounter: Payer: Self-pay | Admitting: Gastroenterology

## 2019-05-17 ENCOUNTER — Other Ambulatory Visit: Payer: Self-pay

## 2019-05-17 ENCOUNTER — Ambulatory Visit (INDEPENDENT_AMBULATORY_CARE_PROVIDER_SITE_OTHER): Payer: PRIVATE HEALTH INSURANCE | Admitting: Gastroenterology

## 2019-05-17 DIAGNOSIS — D229 Melanocytic nevi, unspecified: Secondary | ICD-10-CM

## 2019-05-17 DIAGNOSIS — K529 Noninfective gastroenteritis and colitis, unspecified: Secondary | ICD-10-CM | POA: Diagnosis not present

## 2019-05-17 DIAGNOSIS — R634 Abnormal weight loss: Secondary | ICD-10-CM

## 2019-05-17 DIAGNOSIS — N179 Acute kidney failure, unspecified: Secondary | ICD-10-CM

## 2019-05-17 DIAGNOSIS — E876 Hypokalemia: Secondary | ICD-10-CM

## 2019-05-17 DIAGNOSIS — R1030 Lower abdominal pain, unspecified: Secondary | ICD-10-CM
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
## 2019-05-17 NOTE — Progress Notes (Unsigned)
Put in ref for derm

## 2019-05-18 ENCOUNTER — Encounter: Payer: Self-pay | Admitting: Gastroenterology

## 2019-05-18 MED ORDER — TRAMADOL HCL 50 MG PO TABS: 50.0000 mg | ORAL_TABLET | Freq: Four times a day (QID) | ORAL | 0 refills | Status: DC | PRN

## 2019-05-24 ENCOUNTER — Encounter: Payer: Self-pay | Admitting: Gastroenterology

## 2019-05-25 ENCOUNTER — Other Ambulatory Visit: Payer: Self-pay | Admitting: Gastroenterology

## 2019-05-25 ENCOUNTER — Encounter: Payer: Self-pay | Admitting: Gastroenterology

## 2019-05-25 ENCOUNTER — Other Ambulatory Visit: Payer: Self-pay

## 2019-05-25 DIAGNOSIS — K529 Noninfective gastroenteritis and colitis, unspecified: Secondary | ICD-10-CM

## 2019-05-26 ENCOUNTER — Encounter: Payer: Self-pay | Admitting: Gastroenterology

## 2019-05-27 ENCOUNTER — Other Ambulatory Visit: Payer: Self-pay

## 2019-06-01 LAB — CALCITONIN: Calcitonin: <2 pg/mL (ref 0.0–5.0)

## 2019-06-01 LAB — CORTISOL-AM, BLOOD: Cortisol - AM: 46.3 ug/dL — ABNORMAL HIGH (ref 6.2–19.4)

## 2019-06-02 ENCOUNTER — Ambulatory Visit (INDEPENDENT_AMBULATORY_CARE_PROVIDER_SITE_OTHER): Payer: PRIVATE HEALTH INSURANCE | Admitting: Internal Medicine

## 2019-06-02 ENCOUNTER — Encounter: Payer: Self-pay | Admitting: Gastroenterology

## 2019-06-02 ENCOUNTER — Encounter: Payer: Self-pay | Admitting: Internal Medicine

## 2019-06-02 ENCOUNTER — Other Ambulatory Visit: Payer: Self-pay

## 2019-06-02 VITALS — BP 98/78 | HR 122 | Ht 72.0 in | Wt 216.0 lb

## 2019-06-02 DIAGNOSIS — K529 Noninfective gastroenteritis and colitis, unspecified: Secondary | ICD-10-CM | POA: Diagnosis not present

## 2019-06-02 DIAGNOSIS — R110 Nausea: Secondary | ICD-10-CM | POA: Diagnosis not present

## 2019-06-02 MED ORDER — ONDANSETRON HCL 8 MG PO TABS: 4.0000 mg | ORAL_TABLET | Freq: Three times a day (TID) | ORAL | 1 refills | Status: DC | PRN

## 2019-06-02 MED ORDER — PROMETHAZINE HCL 25 MG PO TABS: 25.0000 mg | ORAL_TABLET | Freq: Every evening | ORAL | 1 refills | Status: DC | PRN

## 2019-06-02 MED ORDER — ONDANSETRON 8 MG PO TBDP: 8.0000 mg | ORAL_TABLET | Freq: Three times a day (TID) | ORAL | 0 refills | Status: DC | PRN

## 2019-06-02 NOTE — Progress Notes (Signed)
Date:  06/02/2019   Name:  Molly Cisneros   DOB:  [birth year]   MRN:  [id number]   Chief Complaint: Diarrhea (X 1 year.)  Diarrhea  This is a chronic problem. The current episode started more than 1 year ago. The problem has been unchanged. The stool consistency is described as watery and mucous. The patient states that diarrhea awakens (3-5 times) her from sleep. Associated symptoms include abdominal pain, vomiting and weight loss (35 lbs in the past year). Pertinent negatives include no chills, coughing, fever or headaches. Treatments tried: viberzi and bentyl has slowed the frequency.    Lab Results  Component Value Date   CREATININE 0.94 05/18/2019   BUN 7 05/18/2019   NA 137 05/18/2019   K 3.3 (L) 05/18/2019   CL 103 05/18/2019   CO2 17 (L) 05/18/2019   Lab Results  Component Value Date   CHOL 266 (H) 05/26/2017   HDL 40 (L) 05/26/2017   LDLCALC 147 (H) 05/26/2017   TRIG 394 (H) 05/26/2017   CHOLHDL 6.7 05/26/2017   Lab Results  Component Value Date   TSH 3.858 02/12/2019   No results found for: HGBA1C   Review of Systems  Constitutional: Positive for fatigue, unexpected weight change and weight loss (35 lbs in the past year). Negative for chills and fever.  Respiratory: Negative for cough, chest tightness and shortness of breath.   Cardiovascular: Negative for chest pain, palpitations and leg swelling.  Gastrointestinal: Positive for abdominal pain, diarrhea, nausea and vomiting. Negative for blood in stool.  Genitourinary: Negative for dysuria.  Neurological: Positive for weakness and light-headedness. Negative for dizziness, tremors and headaches.    Patient Active Problem List   Diagnosis Date Noted  . Chronic diarrhea of unknown origin   . Essential hypertension 11/03/2018  . Hyperlipidemia, mixed 11/03/2018  . Degeneration of C5-C6 intervertebral disc 08/31/2018  . Herniation of left side of L4-L5 intervertebral disc 06/05/2017  . Obesity (BMI 30-39.9)  06/05/2017  . Chronic left-sided low back pain without sciatica 07/23/2015    Allergies  Allergen Reactions  . Shellfish Allergy Anaphylaxis  . Aloe Vera Dermatitis    Past Surgical History:  Procedure Laterality Date  . COLONOSCOPY WITH PROPOFOL N/A 12/23/2018   Procedure: COLONOSCOPY WITH PROPOFOL;  Surgeon: Lin Landsman, MD;  Location: [facility] ENDOSCOPY;  Service: Gastroenterology;  Laterality: N/A;  . ESOPHAGOGASTRODUODENOSCOPY (EGD) WITH PROPOFOL N/A 12/23/2018   Procedure: ESOPHAGOGASTRODUODENOSCOPY (EGD) WITH PROPOFOL;  Surgeon: Lin Landsman, MD;  Location: [facility] ENDOSCOPY;  Service: Gastroenterology;  Laterality: N/A;  . LAPAROSCOPIC APPENDECTOMY N/A 11/06/2018   Procedure: APPENDECTOMY LAPAROSCOPIC;  Surgeon: Herbert Pun, MD;  Location: [facility] ORS;  Service: General;  Laterality: N/A;    Social History   Tobacco Use  . Smoking status: Never Smoker  . Smokeless tobacco: Never Used  Substance Use Topics  . Alcohol use: Yes    Alcohol/week: 2.0 standard drinks    Types: 2 Cans of beer per week  . Drug use: No     Medication list has been reviewed and updated.  Current Meds  Medication Sig  . ALPRAZolam (XANAX) 1 MG tablet Take 1 mg by mouth at bedtime as needed for anxiety.  Marland Kitchen amitriptyline (ELAVIL) 75 MG tablet Take 75 mg by mouth at bedtime.   Marland Kitchen amphetamine-dextroamphetamine (ADDERALL XR) 30 MG 24 hr capsule Take 30 mg by mouth daily.   Marland Kitchen atorvastatin (LIPITOR) 20 MG tablet TAKE (1) TABLET BY MOUTH EVERY DAY  . dicyclomine (  BENTYL) 20 MG tablet Take 1 tablet (20 mg total) by mouth 3 (three) times daily before meals.  . diphenoxylate-atropine (LOMOTIL) 2.5-0.025 MG tablet Take 2 tablets by mouth 4 (four) times daily as needed for diarrhea or loose stools.  . DULoxetine (CYMBALTA) 60 MG capsule Take 60 mg by mouth daily.   . Eluxadoline (VIBERZI) 75 MG TABS Take 75 mg by mouth 2 (two) times daily.  Marland Kitchen gabapentin (NEURONTIN) 300 MG capsule TAKE (1)  CAPSULE BY MOUTH TWICE DAILY (Patient taking differently: daily as needed. )  . hydrOXYzine (VISTARIL) 25 MG capsule TAKE 1 TO 2 CAPSULES BY MOUTH EVERY 6 HOURS  . lisinopril (ZESTRIL) 20 MG tablet Take 1 tablet (20 mg total) by mouth daily.  . ondansetron (ZOFRAN) 8 MG tablet Take 0.5 tablets (4 mg total) by mouth every 8 (eight) hours as needed for nausea or vomiting.  . pantoprazole (PROTONIX) 40 MG tablet TAKE ONE TABLET BY MOUTH TWICE DAILY. 30MINUTES PRIOR TO MEALS  . promethazine (PHENERGAN) 25 MG tablet Take 1 tablet (25 mg total) by mouth at bedtime as needed for nausea or vomiting.  . traMADol (ULTRAM) 50 MG tablet Take by mouth every 6 (six) hours as needed.  . valACYclovir (VALTREX) 1000 MG tablet Take 1 tablet (1,000 mg total) by mouth 2 (two) times daily as needed.   Current Facility-Administered Medications for the 06/02/19 encounter (Office Visit) with Glean Hess, MD  Medication  . ondansetron (ZOFRAN-ODT) disintegrating tablet 8 mg    PHQ 2/9 Scores 06/02/2019 08/31/2018 05/18/2018  PHQ - 2 Score 2 1 0  PHQ- 9 Score 11 - -    BP Readings from Last 3 Encounters:  06/02/19 98/78  05/12/19 (!) 122/97  03/22/19 (!) 144/91    Physical Exam Vitals and nursing note reviewed.  Constitutional:      General: She is not in acute distress.    Appearance: She is well-developed.  HENT:     Head: Normocephalic and atraumatic.  Cardiovascular:     Rate and Rhythm: Regular rhythm. Tachycardia present.     Pulses: Normal pulses.     Heart sounds: No murmur.  Pulmonary:     Effort: Pulmonary effort is normal. No respiratory distress.     Breath sounds: No wheezing or rhonchi.  Abdominal:     General: Abdomen is flat. Bowel sounds are normal.     Palpations: Abdomen is soft.     Tenderness: There is abdominal tenderness in the left upper quadrant. There is no right CVA tenderness, left CVA tenderness, guarding or rebound.  Musculoskeletal:        General: Normal range of  motion.     Cervical back: Normal range of motion.     Right lower leg: No edema.     Left lower leg: No edema.  Lymphadenopathy:     Cervical: No cervical adenopathy.  Skin:    General: Skin is warm and dry.     Findings: No rash.  Neurological:     Mental Status: She is alert and oriented to person, place, and time.  Psychiatric:        Attention and Perception: Attention normal.        Mood and Affect: Mood and affect normal.        Behavior: Behavior normal.        Thought Content: Thought content normal.        Cognition and Memory: Cognition normal.     Wt Readings from Last 3  Encounters:  06/02/19 216 lb (98 kg)  05/12/19 220 lb (99.8 kg)  03/22/19 228 lb 3.2 oz (103.5 kg)    BP 98/78   Pulse (!) 122   Ht 6' (1.829 m)   Wt 216 lb (98 kg)   SpO2 97%   BMI 29.29 kg/m   Assessment and Plan: 1. Chronic diarrhea She has been through almost complete evaluation s/p treatment for C Diff, appendectomy, gall bladder NM study. Now waiting for NM gallium scan to rule out Carcinoid syndrome She did feel some slight benefit from Vyberzi 75 mg bid.  She will increase to 100 mg twice a day (has samples)  2. Nausea Continue bland diet, fluids with electrolytes Will refill anti-emetics - promethazine (PHENERGAN) 25 MG tablet; Take 1 tablet (25 mg total) by mouth at bedtime as needed for nausea or vomiting.  Dispense: 30 tablet; Refill: 1 - ondansetron (ZOFRAN) 8 MG tablet; Take 0.5 tablets (4 mg total) by mouth every 8 (eight) hours as needed for nausea or vomiting.  Dispense: 60 tablet; Refill: 1   Partially dictated using Editor, commissioning. Any errors are unintentional.  Halina Maidens, MD [facility]  06/02/2019

## 2019-06-06 ENCOUNTER — Ambulatory Visit (INDEPENDENT_AMBULATORY_CARE_PROVIDER_SITE_OTHER): Payer: PRIVATE HEALTH INSURANCE | Admitting: Gastroenterology

## 2019-06-06 DIAGNOSIS — R634 Abnormal weight loss: Secondary | ICD-10-CM

## 2019-06-06 DIAGNOSIS — Z9089 Acquired absence of other organs: Secondary | ICD-10-CM | POA: Diagnosis not present

## 2019-06-06 DIAGNOSIS — R109 Unspecified abdominal pain: Secondary | ICD-10-CM | POA: Diagnosis not present

## 2019-06-06 DIAGNOSIS — K529 Noninfective gastroenteritis and colitis, unspecified: Secondary | ICD-10-CM | POA: Diagnosis not present
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
## 2019-06-09 ENCOUNTER — Encounter: Payer: Self-pay | Admitting: Gastroenterology

## 2019-06-09 ENCOUNTER — Other Ambulatory Visit: Payer: Self-pay | Admitting: Gastroenterology

## 2019-06-09 DIAGNOSIS — R1030 Lower abdominal pain, unspecified: Secondary | ICD-10-CM

## 2019-06-09 DIAGNOSIS — K529 Noninfective gastroenteritis and colitis, unspecified: Secondary | ICD-10-CM

## 2019-06-09 MED ORDER — RIFAXIMIN 550 MG PO TABS: 550.0000 mg | ORAL_TABLET | Freq: Two times a day (BID) | ORAL | 0 refills | Status: DC

## 2019-06-10 ENCOUNTER — Ambulatory Visit (INDEPENDENT_AMBULATORY_CARE_PROVIDER_SITE_OTHER): Payer: PRIVATE HEALTH INSURANCE | Admitting: Internal Medicine

## 2019-06-10 ENCOUNTER — Encounter: Payer: Self-pay | Admitting: Internal Medicine

## 2019-06-10 ENCOUNTER — Other Ambulatory Visit: Payer: Self-pay

## 2019-06-10 VITALS — Temp 98.1°F | Ht 72.0 in | Wt 215.0 lb

## 2019-06-10 DIAGNOSIS — R55 Syncope and collapse: Secondary | ICD-10-CM

## 2019-06-10 DIAGNOSIS — R1030 Lower abdominal pain, unspecified: Secondary | ICD-10-CM | POA: Diagnosis not present

## 2019-06-10 DIAGNOSIS — R197 Diarrhea, unspecified: Secondary | ICD-10-CM

## 2019-06-10 MED ORDER — TRAMADOL HCL 50 MG PO TABS: 50.0000 mg | ORAL_TABLET | Freq: Four times a day (QID) | ORAL | 0 refills | Status: DC | PRN

## 2019-06-10 NOTE — Progress Notes (Signed)
Date:  06/10/2019   Name:  Molly Cisneros   DOB:  02/27/1986   MRN:  [id number]   Chief Complaint: No chief complaint on file.  Loss of Consciousness This is a new problem. The current episode started today. She lost consciousness for a period of less than 1 minute. The symptoms are aggravated by defecation. Associated symptoms include abdominal pain, bowel incontinence, light-headedness, nausea and weakness. Pertinent negatives include no chest pain, fever, palpitations (rapid heart beat) or vomiting. Treatments tried: ongoing diarrhea - being worked up by GI.  Diarrhea  This is a chronic problem. The current episode started more than 1 month ago. The problem occurs 5 to 10 times per day. The problem has been gradually worsening. The stool consistency is described as watery. The patient states that diarrhea awakens her from sleep. Associated symptoms include abdominal pain, myalgias (muscle cramps and sense of vibration) and weight loss. Pertinent negatives include no chills, fever or vomiting.  Abdominal Pain This is a chronic problem. The pain is located in the LUQ. The quality of the pain is cramping. Associated symptoms include diarrhea, myalgias (muscle cramps and sense of vibration), nausea and weight loss. Pertinent negatives include no fever or vomiting.    Lab Results  Component Value Date   CREATININE 0.94 05/18/2019   BUN 7 05/18/2019   NA 137 05/18/2019   K 3.3 (L) 05/18/2019   CL 103 05/18/2019   CO2 17 (L) 05/18/2019   Lab Results  Component Value Date   CHOL 266 (H) 05/26/2017   HDL 40 (L) 05/26/2017   LDLCALC 147 (H) 05/26/2017   TRIG 394 (H) 05/26/2017   CHOLHDL 6.7 05/26/2017   Lab Results  Component Value Date   TSH 3.858 02/12/2019   No results found for: HGBA1C   Review of Systems  Constitutional: Positive for fatigue, unexpected weight change and weight loss. Negative for chills and fever.  HENT: Negative for trouble swallowing.   Respiratory: Negative  for chest tightness and shortness of breath.   Cardiovascular: Positive for syncope. Negative for chest pain, palpitations (rapid heart beat) and leg swelling.  Gastrointestinal: Positive for abdominal pain, bowel incontinence, diarrhea and nausea. Negative for blood in stool and vomiting.  Musculoskeletal: Positive for myalgias (muscle cramps and sense of vibration).  Skin: Negative for color change and rash.  Neurological: Positive for syncope, weakness and light-headedness.  Psychiatric/Behavioral: Positive for dysphoric mood and sleep disturbance.    Patient Active Problem List   Diagnosis Date Noted  . Chronic diarrhea of unknown origin   . Essential hypertension 11/03/2018  . Hyperlipidemia, mixed 11/03/2018  . Degeneration of C5-C6 intervertebral disc 08/31/2018  . Herniation of left side of L4-L5 intervertebral disc 06/05/2017  . Obesity (BMI 30-39.9) 06/05/2017  . Chronic left-sided low back pain without sciatica 07/23/2015    Allergies  Allergen Reactions  . Shellfish Allergy Anaphylaxis  . Aloe Vera Dermatitis    Past Surgical History:  Procedure Laterality Date  . COLONOSCOPY WITH PROPOFOL N/A 12/23/2018   Procedure: COLONOSCOPY WITH PROPOFOL;  Surgeon: Lin Landsman, MD;  Location[facility] ENDOSCOPY;  Service: Gastroenterology;  Laterality: N/A;  . ESOPHAGOGASTRODUODENOSCOPY (EGD) WITH PROPOFOL N/A 12/23/2018   Procedure: ESOPHAGOGASTRODUODENOSCOPY (EGD) WITH PROPOFOL;  Surgeon: Lin Landsman, MD;  Location: [facility] ENDOSCOPY;  Service: Gastroenterology;  Laterality: N/A;  . LAPAROSCOPIC APPENDECTOMY N/A 11/06/2018   Procedure: APPENDECTOMY LAPAROSCOPIC;  Surgeon: Herbert Pun, MD;  Location: [facility] ORS;  Service: General;  Laterality: N/A;  Social History   Tobacco Use  . Smoking status: Never Smoker  . Smokeless tobacco: Never Used  Substance Use Topics  . Alcohol use: Yes    Alcohol/week: 2.0 standard drinks    Types: 2 Cans of beer per week  .  Drug use: No     Medication list has been reviewed and updated.  Current Meds  Medication Sig  . ALPRAZolam (XANAX) 1 MG tablet Take 1 mg by mouth at bedtime as needed for anxiety.  Marland Kitchen amitriptyline (ELAVIL) 75 MG tablet Take 75 mg by mouth at bedtime.   Marland Kitchen amphetamine-dextroamphetamine (ADDERALL XR) 30 MG 24 hr capsule Take 30 mg by mouth daily.   Marland Kitchen atorvastatin (LIPITOR) 20 MG tablet TAKE (1) TABLET BY MOUTH EVERY DAY  . dicyclomine (BENTYL) 20 MG tablet Take 1 tablet (20 mg total) by mouth 3 (three) times daily before meals.  . diphenoxylate-atropine (LOMOTIL) 2.5-0.025 MG tablet Take 2 tablets by mouth 4 (four) times daily as needed for diarrhea or loose stools.  . DULoxetine (CYMBALTA) 60 MG capsule Take 60 mg by mouth daily.   . Eluxadoline (VIBERZI) 75 MG TABS Take 75 mg by mouth 2 (two) times daily.  Marland Kitchen gabapentin (NEURONTIN) 300 MG capsule TAKE (1) CAPSULE BY MOUTH TWICE DAILY (Patient taking differently: daily as needed. )  . hydrOXYzine (VISTARIL) 25 MG capsule TAKE 1 TO 2 CAPSULES BY MOUTH EVERY 6 HOURS  . lisinopril (ZESTRIL) 20 MG tablet Take 1 tablet (20 mg total) by mouth daily.  . ondansetron (ZOFRAN ODT) 8 MG disintegrating tablet Take 1 tablet (8 mg total) by mouth every 8 (eight) hours as needed for nausea or vomiting. In place of previous Rx for zofran  . ondansetron (ZOFRAN) 8 MG tablet Take 0.5 tablets (4 mg total) by mouth every 8 (eight) hours as needed for nausea or vomiting.  . pantoprazole (PROTONIX) 40 MG tablet TAKE ONE TABLET BY MOUTH TWICE DAILY. 30MINUTES PRIOR TO MEALS  . promethazine (PHENERGAN) 25 MG tablet Take 1 tablet (25 mg total) by mouth at bedtime as needed for nausea or vomiting.  . rifaximin (XIFAXAN) 550 MG TABS tablet Take 1 tablet (550 mg total) by mouth 2 (two) times daily for 14 days.  . traMADol (ULTRAM) 50 MG tablet Take by mouth every 6 (six) hours as needed.  . valACYclovir (VALTREX) 1000 MG tablet Take 1 tablet (1,000 mg total) by mouth 2  (two) times daily as needed.   Current Facility-Administered Medications for the 06/10/19 encounter (Office Visit) with Glean Hess, MD  Medication  . ondansetron (ZOFRAN-ODT) disintegrating tablet 8 mg    PHQ 2/9 Scores 06/02/2019 08/31/2018 05/18/2018  PHQ - 2 Score 2 1 0  PHQ- 9 Score 11 - -    BP Readings from Last 3 Encounters:  06/02/19 98/78  05/12/19 (!) 122/97  03/22/19 (!) 144/91    Physical Exam Constitutional:      Appearance: She is ill-appearing.  Cardiovascular:     Rate and Rhythm: Regular rhythm. Tachycardia present.     Pulses: Normal pulses.  Pulmonary:     Effort: Pulmonary effort is normal.     Breath sounds: Normal breath sounds.  Musculoskeletal:     Right lower leg: No edema.     Left lower leg: No edema.  Skin:    General: Skin is warm and dry.     Capillary Refill: Capillary refill takes less than 2 seconds.  Neurological:     General: No focal deficit present.  Mental Status: She is alert.     Wt Readings from Last 3 Encounters:  06/10/19 215 lb (97.5 kg)  06/02/19 216 lb (98 kg)  05/12/19 220 lb (99.8 kg)     Temp 98.1 F (36.7 C) (Oral)   Ht 6' (1.829 m)   Wt 215 lb (97.5 kg)   SpO2 100%   BMI 29.16 kg/m   Assessment and Plan: 1. Syncope, unspecified syncope type Triggered by a bowel movement early this AM No injury noted, loc limited to a second Feels okay now just weak - Comprehensive metabolic panel - CBC with Differential/Platelet  2. Lower abdominal pain Ongoing abdominal pain - currently trying to work this up MRI scheduled on 06/17/2019 - traMADol (ULTRAM) 50 MG tablet; Take 1 tablet (50 mg total) by mouth every 6 (six) hours as needed for up to 5 days.  Dispense: 20 tablet; Refill: 0 - Lactic acid, plasma  3. Diarrhea, unspecified type Chronic diarrhea with orthostatic BP changes Continue gatorade and diet as tolerated Concern for electrolyte changes wo will get labs Will ask Oncology to help get the  appropriate PET scan to evaluate for a neuroendocrine tumor - Magnesium - Ambulatory referral to Hematology / Oncology   Partially dictated using Dragon software. Any errors are unintentional.  Halina Maidens, MD [facility]  06/10/2019

## 2019-06-11 ENCOUNTER — Telehealth: Payer: Self-pay | Admitting: Nurse Practitioner

## 2019-06-11 ENCOUNTER — Other Ambulatory Visit: Payer: Self-pay

## 2019-06-11 ENCOUNTER — Inpatient Hospital Stay
Admission: EM | Admit: 2019-06-11 | Discharge: 2019-06-16 | DRG: 641 | Disposition: A | Discharge status: Home / Self Care | Payer: PRIVATE HEALTH INSURANCE | Attending: Internal Medicine | Admitting: Internal Medicine

## 2019-06-11 ENCOUNTER — Encounter: Payer: Self-pay | Admitting: Emergency Medicine

## 2019-06-11 DIAGNOSIS — K529 Noninfective gastroenteritis and colitis, unspecified: Secondary | ICD-10-CM | POA: Diagnosis present

## 2019-06-11 DIAGNOSIS — F909 Attention-deficit hyperactivity disorder, unspecified type: Secondary | ICD-10-CM | POA: Diagnosis present

## 2019-06-11 DIAGNOSIS — R197 Diarrhea, unspecified: Secondary | ICD-10-CM

## 2019-06-11 DIAGNOSIS — M50322 Other cervical disc degeneration at C5-C6 level: Secondary | ICD-10-CM | POA: Diagnosis present

## 2019-06-11 DIAGNOSIS — I10 Essential (primary) hypertension: Secondary | ICD-10-CM | POA: Diagnosis present

## 2019-06-11 DIAGNOSIS — E878 Other disorders of electrolyte and fluid balance, not elsewhere classified: Secondary | ICD-10-CM

## 2019-06-11 DIAGNOSIS — Z79891 Long term (current) use of opiate analgesic: Secondary | ICD-10-CM

## 2019-06-11 DIAGNOSIS — Z808 Family history of malignant neoplasm of other organs or systems: Secondary | ICD-10-CM

## 2019-06-11 DIAGNOSIS — E7800 Pure hypercholesterolemia, unspecified: Secondary | ICD-10-CM | POA: Diagnosis present

## 2019-06-11 DIAGNOSIS — E876 Hypokalemia: Secondary | ICD-10-CM

## 2019-06-11 DIAGNOSIS — E871 Hypo-osmolality and hyponatremia: Principal | ICD-10-CM

## 2019-06-11 DIAGNOSIS — F419 Anxiety disorder, unspecified: Secondary | ICD-10-CM | POA: Diagnosis present

## 2019-06-11 DIAGNOSIS — E669 Obesity, unspecified: Secondary | ICD-10-CM | POA: Diagnosis present

## 2019-06-11 DIAGNOSIS — Z6829 Body mass index (BMI) 29.0-29.9, adult: Secondary | ICD-10-CM

## 2019-06-11 DIAGNOSIS — E861 Hypovolemia: Secondary | ICD-10-CM | POA: Diagnosis present

## 2019-06-11 DIAGNOSIS — Z20822 Contact with and (suspected) exposure to covid-19: Secondary | ICD-10-CM | POA: Diagnosis present

## 2019-06-11 DIAGNOSIS — Z807 Family history of other malignant neoplasms of lymphoid, hematopoietic and related tissues: Secondary | ICD-10-CM

## 2019-06-11 DIAGNOSIS — Z79899 Other long term (current) drug therapy: Secondary | ICD-10-CM

## 2019-06-11 DIAGNOSIS — F329 Major depressive disorder, single episode, unspecified: Secondary | ICD-10-CM | POA: Diagnosis present

## 2019-06-11 DIAGNOSIS — Z800 Family history of malignant neoplasm of digestive organs: Secondary | ICD-10-CM

## 2019-06-11 LAB — CBC
HCT: 39.5 % (ref 36.0–46.0)
HCT: 38.3 % (ref 36.0–46.0)
Hemoglobin: 14.5 g/dL (ref 12.0–15.0)
Hemoglobin: 14.2 g/dL (ref 12.0–15.0)
MCH: 28.8 pg (ref 26.0–34.0)
MCH: 28.7 pg (ref 26.0–34.0)
MCHC: 36.7 g/dL — ABNORMAL HIGH (ref 30.0–36.0)
MCHC: 37.1 g/dL — ABNORMAL HIGH (ref 30.0–36.0)
MCV: 78.4 fL — ABNORMAL LOW (ref 80.0–100.0)
MCV: 77.4 fL — ABNORMAL LOW (ref 80.0–100.0)
Platelets: 555 10*3/uL — ABNORMAL HIGH (ref 150–400)
Platelets: 545 10*3/uL — ABNORMAL HIGH (ref 150–400)
RBC: 5.04 MIL/uL (ref 3.87–5.11)
RBC: 4.95 MIL/uL (ref 3.87–5.11)
RDW: 12.4 % (ref 11.5–15.5)
RDW: 12.3 % (ref 11.5–15.5)
WBC: 12.4 10*3/uL — ABNORMAL HIGH (ref 4.0–10.5)
WBC: 13.8 10*3/uL — ABNORMAL HIGH (ref 4.0–10.5)
nRBC: 0 % (ref 0.0–0.2)
nRBC: 0 % (ref 0.0–0.2)

## 2019-06-11 LAB — SEDIMENTATION RATE: Sed Rate: 5 mm/hr (ref 0–20)

## 2019-06-11 LAB — BASIC METABOLIC PANEL
Anion gap: 12 (ref 5–15)
BUN: 6 mg/dL (ref 6–20)
CO2: 19 mmol/L — ABNORMAL LOW (ref 22–32)
Calcium: 8.7 mg/dL — ABNORMAL LOW (ref 8.9–10.3)
Chloride: 88 mmol/L — ABNORMAL LOW (ref 98–111)
Creatinine, Ser: 0.88 mg/dL (ref 0.44–1.00)
GFR calc Af Amer: >60 mL/min (ref >60)
GFR calc non Af Amer: >60 mL/min (ref >60)
Glucose, Bld: 104 mg/dL — ABNORMAL HIGH (ref 70–99)
Potassium: 2 mmol/L — CL (ref 3.5–5.1)
Sodium: 119 mmol/L — CL (ref 135–145)

## 2019-06-11 LAB — COMPREHENSIVE METABOLIC PANEL
ALT: 23 U/L (ref 0–44)
AST: 34 U/L (ref 15–41)
Albumin: 3.7 g/dL (ref 3.5–5.0)
Alkaline Phosphatase: 53 U/L (ref 38–126)
Anion gap: 14 (ref 5–15)
BUN: 8 mg/dL (ref 6–20)
CO2: 19 mmol/L — ABNORMAL LOW (ref 22–32)
Calcium: 8.5 mg/dL — ABNORMAL LOW (ref 8.9–10.3)
Chloride: 87 mmol/L — ABNORMAL LOW (ref 98–111)
Creatinine, Ser: 1 mg/dL (ref 0.44–1.00)
GFR calc Af Amer: >60 mL/min (ref >60)
GFR calc non Af Amer: >60 mL/min (ref >60)
Glucose, Bld: 109 mg/dL — ABNORMAL HIGH (ref 70–99)
Potassium: 2.4 mmol/L — CL (ref 3.5–5.1)
Sodium: 120 mmol/L — ABNORMAL LOW (ref 135–145)
Total Bilirubin: 1 mg/dL (ref 0.3–1.2)
Total Protein: 7.2 g/dL (ref 6.5–8.1)

## 2019-06-11 LAB — URINALYSIS, COMPLETE (UACMP) WITH MICROSCOPIC
Bacteria, UA: NONE SEEN
Bilirubin Urine: NEGATIVE
Glucose, UA: NEGATIVE mg/dL
Hgb urine dipstick: NEGATIVE
Ketones, ur: NEGATIVE mg/dL
Leukocytes,Ua: NEGATIVE
Nitrite: NEGATIVE
Protein, ur: NEGATIVE mg/dL
Specific Gravity, Urine: 1.001 — ABNORMAL LOW (ref 1.005–1.030)
pH: 7 (ref 5.0–8.0)

## 2019-06-11 LAB — LIPASE, BLOOD: Lipase: 21 U/L (ref 11–51)

## 2019-06-11 LAB — T4, FREE: Free T4: 1.17 ng/dL — ABNORMAL HIGH (ref 0.61–1.12)

## 2019-06-11 LAB — HEMOGLOBIN A1C
Hgb A1c MFr Bld: 5.4 % (ref 4.8–5.6)
Mean Plasma Glucose: 108.28 mg/dL

## 2019-06-11 LAB — LACTOFERRIN, FECAL, QUALITATIVE: Lactoferrin, Fecal, Qual: NEGATIVE

## 2019-06-11 LAB — TSH: TSH: 6.013 u[IU]/mL — ABNORMAL HIGH (ref 0.350–4.500)

## 2019-06-11 LAB — PREGNANCY, URINE: Preg Test, Ur: NEGATIVE

## 2019-06-11 LAB — C DIFFICILE QUICK SCREEN W PCR REFLEX
C Diff antigen: POSITIVE — AB
C Diff toxin: NEGATIVE

## 2019-06-11 LAB — MAGNESIUM
Magnesium: 1.9 mg/dL (ref 1.7–2.4)
Magnesium: 1.9 mg/dL (ref 1.7–2.4)

## 2019-06-11 LAB — GAMMA GT: GGT: 32 U/L (ref 7–50)

## 2019-06-11 LAB — CLOSTRIDIUM DIFFICILE BY PCR, REFLEXED: Toxigenic C. Difficile by PCR: NEGATIVE

## 2019-06-11 MED ORDER — THIAMINE HCL 100 MG PO TABS
100.0000 mg | ORAL_TABLET | Freq: Every day | ORAL | Status: DC
  Administered 2019-06-11 – 2019-06-16 (×6): 100 mg via ORAL
  Filled 2019-06-11 (×6): qty 1

## 2019-06-11 MED ORDER — ENOXAPARIN SODIUM 40 MG/0.4ML ~~LOC~~ SOLN
40.0000 mg | SUBCUTANEOUS | Status: DC
  Administered 2019-06-11 – 2019-06-15 (×5): 40 mg via SUBCUTANEOUS
  Filled 2019-06-11 (×5): qty 0.4

## 2019-06-11 MED ORDER — HYDROXYZINE HCL 25 MG PO TABS: 25.0000 mg | ORAL_TABLET | Freq: Four times a day (QID) | ORAL | Status: DC | PRN

## 2019-06-11 MED ORDER — ONDANSETRON HCL 4 MG/2ML IJ SOLN
4.0000 mg | Freq: Once | INTRAMUSCULAR | Status: AC | PRN
  Administered 2019-06-11: 4 mg via INTRAVENOUS
  Filled 2019-06-11: qty 2

## 2019-06-11 MED ORDER — POTASSIUM CHLORIDE IN NACL 20-0.9 MEQ/L-% IV SOLN
INTRAVENOUS | Status: DC
  Filled 2019-06-11 (×2): qty 1000

## 2019-06-11 MED ORDER — PANTOPRAZOLE SODIUM 40 MG IV SOLR
40.0000 mg | INTRAVENOUS | Status: DC
  Administered 2019-06-11 – 2019-06-14 (×4): 40 mg via INTRAVENOUS
  Filled 2019-06-11 (×4): qty 40

## 2019-06-11 MED ORDER — ALPRAZOLAM 0.5 MG PO TABS
1.0000 mg | ORAL_TABLET | Freq: Three times a day (TID) | ORAL | Status: DC | PRN
  Administered 2019-06-11: 1 mg via ORAL
  Filled 2019-06-11: qty 4

## 2019-06-11 MED ORDER — OXYCODONE HCL 5 MG PO TABS
5.0000 mg | ORAL_TABLET | Freq: Four times a day (QID) | ORAL | Status: DC | PRN
  Administered 2019-06-12 – 2019-06-15 (×4): 5 mg via ORAL
  Filled 2019-06-11 (×5): qty 1

## 2019-06-11 MED ORDER — ADULT MULTIVITAMIN W/MINERALS CH
1.0000 | ORAL_TABLET | Freq: Every day | ORAL | Status: DC
  Administered 2019-06-11 – 2019-06-16 (×6): 1 via ORAL
  Filled 2019-06-11 (×7): qty 1

## 2019-06-11 MED ORDER — POTASSIUM CHLORIDE IN NACL 20-0.9 MEQ/L-% IV SOLN
Freq: Once | INTRAVENOUS | Status: AC
  Filled 2019-06-11: qty 1000

## 2019-06-11 MED ORDER — DULOXETINE HCL 30 MG PO CPEP
60.0000 mg | ORAL_CAPSULE | Freq: Every day | ORAL | Status: DC
  Administered 2019-06-12 – 2019-06-16 (×5): 60 mg via ORAL
  Filled 2019-06-11 (×5): qty 2

## 2019-06-11 MED ORDER — ONDANSETRON 4 MG PO TBDP
8.0000 mg | ORAL_TABLET | Freq: Three times a day (TID) | ORAL | Status: DC | PRN
  Administered 2019-06-11 – 2019-06-13 (×4): 8 mg via ORAL
  Filled 2019-06-11 (×4): qty 2

## 2019-06-11 MED ORDER — MORPHINE SULFATE (PF) 2 MG/ML IV SOLN
2.0000 mg | INTRAVENOUS | Status: DC | PRN
  Administered 2019-06-11 – 2019-06-15 (×7): 2 mg via INTRAVENOUS
  Filled 2019-06-11 (×7): qty 1

## 2019-06-11 MED ORDER — ATORVASTATIN CALCIUM 20 MG PO TABS
20.0000 mg | ORAL_TABLET | Freq: Every day | ORAL | Status: DC
  Administered 2019-06-12 – 2019-06-16 (×5): 20 mg via ORAL
  Filled 2019-06-11 (×5): qty 1

## 2019-06-11 MED ORDER — SODIUM CHLORIDE 0.9 % IV BOLUS
1000.0000 mL | Freq: Once | INTRAVENOUS | Status: AC
  Administered 2019-06-11: 1000 mL via INTRAVENOUS

## 2019-06-11 MED ORDER — SUCRALFATE 1 G PO TABS
1.0000 g | ORAL_TABLET | Freq: Three times a day (TID) | ORAL | Status: DC
  Administered 2019-06-11 – 2019-06-16 (×17): 1 g via ORAL
  Filled 2019-06-11 (×17): qty 1

## 2019-06-11 MED ORDER — SODIUM CHLORIDE 0.9% FLUSH: 3.0000 mL | Freq: Once | INTRAVENOUS | Status: DC

## 2019-06-11 MED ORDER — POTASSIUM CHLORIDE CRYS ER 20 MEQ PO TBCR
40.0000 meq | EXTENDED_RELEASE_TABLET | Freq: Once | ORAL | Status: AC
  Administered 2019-06-11: 40 meq via ORAL
  Filled 2019-06-11: qty 2

## 2019-06-11 MED ORDER — GABAPENTIN 300 MG PO CAPS: 300.0000 mg | ORAL_CAPSULE | Freq: Two times a day (BID) | ORAL | Status: DC | PRN

## 2019-06-11 MED ORDER — MORPHINE SULFATE (PF) 4 MG/ML IV SOLN
4.0000 mg | Freq: Once | INTRAVENOUS | Status: AC
  Administered 2019-06-11: 13:00:00 4 mg via INTRAVENOUS
  Filled 2019-06-11: qty 1

## 2019-06-11 NOTE — Telephone Encounter (Signed)
2nd attempt to call patient about critical lab value, remainder of labs not showing in chart yet.  Was able to leave voicemail this time, call went to voicemail.  Left general HIPAA compliant message for patient to return call to this provider.

## 2019-06-11 NOTE — ED Provider Notes (Signed)
[facility] Emergency Department Provider Note  ____________________________________________   First MD Initiated Contact with Patient 06/11/19 1103     (approximate)  I have reviewed the triage vital signs and the nursing notes.  History  Chief Complaint Abnormal Lab and Abdominal Pain    HPI Molly Cisneros is a 34 y.o. female with hx of chronic diarrhea (being work up by GI) who presents for abnormal labs. Patient had outpatient labs done yesterday, and noted to have severely abnormal electrolytes, instructed to seek care.   She states she has had greater than 10 episodes of diarrhea daily for almost 8 months now. Was previously positive for C diff in September but was successfully treated with subsequent negative test, and still has ongoing issues. Currently in the middle of ongoing testing/work up with GI.  Reports associated weakness, fatigue. No seizure or AMS.   Has associated chronic abdominal discomfort with diarrhea episodes, primarily left sided, cramping/stabbing. Primarily occur with bowel movements. 8/10, no radiation, no alleviating/aggravating components.     Past Medical Hx Past Medical History:  Diagnosis Date  . Acute appendicitis with localized peritonitis 11/06/2018  . Anxiety   . C. difficile diarrhea 02/05/2019  . Clostridium difficile diarrhea   . High cholesterol   . Hypertension   . Hypokalemia 02/05/2019  . Loss of weight   . Severe sepsis ([place]) 02/05/2019  . Shingles    reoccuring shingles on face    Problem List Patient Active Problem List   Diagnosis Date Noted  . Chronic diarrhea of unknown origin   . Essential hypertension 11/03/2018  . Hyperlipidemia, mixed 11/03/2018  . Degeneration of C5-C6 intervertebral disc 08/31/2018  . Herniation of left side of L4-L5 intervertebral disc 06/05/2017  . Obesity (BMI 30-39.9) 06/05/2017  . Chronic left-sided low back pain without sciatica 07/23/2015    Past Surgical  Hx Past Surgical History:  Procedure Laterality Date  . COLONOSCOPY WITH PROPOFOL N/A 12/23/2018   Procedure: COLONOSCOPY WITH PROPOFOL;  Surgeon: Lin Landsman, MD;  Location: [facility] ENDOSCOPY;  Service: Gastroenterology;  Laterality: N/A;  . ESOPHAGOGASTRODUODENOSCOPY (EGD) WITH PROPOFOL N/A 12/23/2018   Procedure: ESOPHAGOGASTRODUODENOSCOPY (EGD) WITH PROPOFOL;  Surgeon: Lin Landsman, MD;  Location: [facility] ENDOSCOPY;  Service: Gastroenterology;  Laterality: N/A;  . LAPAROSCOPIC APPENDECTOMY N/A 11/06/2018   Procedure: APPENDECTOMY LAPAROSCOPIC;  Surgeon: Herbert Pun, MD;  Location: [facility] ORS;  Service: General;  Laterality: N/A;    Medications Prior to Admission medications   Medication Sig Start Date End Date Taking? Authorizing Provider  ALPRAZolam Duanne Moron) 1 MG tablet Take 1 mg by mouth at bedtime as needed for anxiety.    [provider]  amitriptyline (ELAVIL) 75 MG tablet Take 75 mg by mouth at bedtime.  01/03/19   [provider]  amphetamine-dextroamphetamine (ADDERALL XR) 30 MG 24 hr capsule Take 30 mg by mouth daily.  09/27/18   [provider]  atorvastatin (LIPITOR) 20 MG tablet TAKE (1) TABLET BY MOUTH EVERY DAY 03/03/19   Glean Hess, MD  dicyclomine (BENTYL) 20 MG tablet Take 1 tablet (20 mg total) by mouth 3 (three) times daily before meals. 05/16/19   Lucilla Lame, MD  diphenoxylate-atropine (LOMOTIL) 2.5-0.025 MG tablet Take 2 tablets by mouth 4 (four) times daily as needed for diarrhea or loose stools. 02/18/19   Saundra Shelling, MD  DULoxetine (CYMBALTA) 60 MG capsule Take 60 mg by mouth daily.  07/18/15   [provider]  Eluxadoline (VIBERZI) 75 MG TABS Take 75  mg by mouth 2 (two) times daily.    [provider]  gabapentin (NEURONTIN) 300 MG capsule TAKE (1) CAPSULE BY MOUTH TWICE DAILY Patient taking differently: daily as needed.  05/16/19   Glean Hess, MD  hydrOXYzine (VISTARIL) 25 MG capsule TAKE 1 TO  2 CAPSULES BY MOUTH EVERY 6 HOURS 05/16/19   Glean Hess, MD  lisinopril (ZESTRIL) 20 MG tablet Take 1 tablet (20 mg total) by mouth daily. 03/22/19   Glean Hess, MD  ondansetron (ZOFRAN ODT) 8 MG disintegrating tablet Take 1 tablet (8 mg total) by mouth every 8 (eight) hours as needed for nausea or vomiting. In place of previous Rx for zofran 06/02/19   Glean Hess, MD  ondansetron (ZOFRAN) 8 MG tablet Take 0.5 tablets (4 mg total) by mouth every 8 (eight) hours as needed for nausea or vomiting. 06/02/19   Glean Hess, MD  pantoprazole (PROTONIX) 40 MG tablet TAKE ONE TABLET BY MOUTH TWICE DAILY. 30MINUTES PRIOR TO MEALS 05/04/19   Glean Hess, MD  promethazine (PHENERGAN) 25 MG tablet Take 1 tablet (25 mg total) by mouth at bedtime as needed for nausea or vomiting. 06/02/19   Glean Hess, MD  rifaximin (XIFAXAN) 550 MG TABS tablet Take 1 tablet (550 mg total) by mouth 2 (two) times daily for 14 days. 06/09/19 06/23/19  Lin Landsman, MD  traMADol (ULTRAM) 50 MG tablet Take by mouth every 6 (six) hours as needed.    [provider]  traMADol (ULTRAM) 50 MG tablet Take 1 tablet (50 mg total) by mouth every 6 (six) hours as needed for up to 5 days. 06/10/19 06/15/19  Glean Hess, MD  valACYclovir (VALTREX) 1000 MG tablet Take 1 tablet (1,000 mg total) by mouth 2 (two) times daily as needed. 10/26/18   Glean Hess, MD  amitriptyline (ELAVIL) 25 MG tablet TAKE (1) TABLET BY MOUTH DAILY AT BEDTIME 11/23/18   Vanga, Tally Due, MD  dicyclomine (BENTYL) 10 MG capsule TAKE (1) CAPSULE BY MOUTH FOUR TIMES A DAY BEFORE MEALS AND AT BEDTIME. Patient taking differently: Take 10 mg by mouth 4 (four) times daily.  12/21/18   Lin Landsman, MD    Allergies Shellfish allergy and Aloe vera  Family Hx Family History  Problem Relation Age of Onset  . Non-Hodgkin's lymphoma Father 52       Basil Cell  . Pancreatic cancer Maternal Grandmother 30  . Thyroid  cancer Maternal Grandmother 45  . Throat cancer Paternal Grandfather 75    Social Hx Social History   Tobacco Use  . Smoking status: Never Smoker  . Smokeless tobacco: Never Used  Substance Use Topics  . Alcohol use: Yes    Alcohol/week: 2.0 standard drinks    Types: 2 Cans of beer per week  . Drug use: No     Review of Systems  Constitutional: Negative for fever, chills. Eyes: Negative for visual changes. ENT: Negative for sore throat. Cardiovascular: Negative for chest pain. Respiratory: Negative for shortness of breath. Gastrointestinal: + diarrhea Genitourinary: Negative for dysuria. Musculoskeletal: Negative for leg swelling. Skin: Negative for rash. Neurological: Negative for for headaches.   Physical Exam  Vital Signs: ED Triage Vitals  Enc Vitals Group     BP 06/11/19 0754 103/69     Pulse Rate 06/11/19 0754 (!) 107     Resp 06/11/19 0754 16     Temp 06/11/19 0754 98.5 F (36.9 C)  Temp Source 06/11/19 0754 Oral     SpO2 06/11/19 0754 100 %     Weight 06/11/19 0755 215 lb (97.5 kg)     Height 06/11/19 0755 5\' 11"  (1.803 m)     Head Circumference --      Peak Flow --      Pain Score 06/11/19 0755 8     Pain Loc --      Pain Edu? --      Excl. in [place]? --     Constitutional: Alert and oriented.  Head: Normocephalic. Atraumatic. Eyes: Conjunctivae clear. Sclera anicteric. Nose: No congestion. No rhinorrhea. Mouth/Throat: Wearing mask. MM dry. Neck: No stridor.   Cardiovascular: Normal rate, regular rhythm. Extremities well perfused. Respiratory: Normal respiratory effort.  Lungs CTAB. Gastrointestinal: Soft. L sided tenderness, no rebound or guarding. Non-distended.  Musculoskeletal: No lower extremity edema. No deformities. Neurologic:  Normal speech and language. No gross focal neurologic deficits are appreciated.  Skin: Skin is dry. Psychiatric: Mood and affect are appropriate for situation.   Procedures  Procedure(s) performed  (including critical care):  .Critical Care Performed by: Lilia Pro., MD Authorized by: Lilia Pro., MD   Critical care provider statement:    Critical care time (minutes):  55   Critical care was time spent personally by me on the following activities:  Discussions with consultants, evaluation of patient's response to treatment, examination of patient, ordering and performing treatments and interventions, ordering and review of laboratory studies, ordering and review of radiographic studies, pulse oximetry, re-evaluation of patient's condition, obtaining history from patient or surrogate and review of old charts     Initial Impression / Assessment and Plan / ED Course  34 y.o. female with chronic ongoing diarrhea (as above) who presents to the ED for hyponatremia, hypokalemia.  Repeat labs confirm significant hypoNa to 120, hypoK 2.4, hypoCl 81, bicarb 19. Magnesium WNL. Received PO K and IVF bolus in triage. Will continue with NS w/ K infusion and plan to admit for further correction/repeltion of her severe derangements. Patient agreeable.    Final Clinical Impression(s) / ED Diagnosis  Final diagnoses:  Hyponatremia  Hypokalemia  Hypochloremia  Diarrhea in adult patient       Note:  This document was prepared using Dragon voice recognition software and may include unintentional dictation errors.   Lilia Pro., MD 06/11/19 [phone]

## 2019-06-11 NOTE — Telephone Encounter (Signed)
Spoke to patient and Dr. Otilio Miu this morning, they are going to touch based with Dr. Army Melia for her guidance.
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
## 2019-06-11 NOTE — Telephone Encounter (Signed)
Nurse call line with critical value of sodium 115 on labs called to this provider at [facility].  Review of chart notes patient was seen yesterday for chronic diarrhea and abdominal pain, with diarrhea presenting with episode of syncope lasting less then one minute, myalgia, and weight loss.  Labs were obtained and ASAP referral oncology noted.  On review last sodium was 05/18/19 and was 137.  On review sodium levels have been 116 to 139 over past months, with her lower 116 range being hospitalized and finding for C. Diff.  Attempt to call patient performed and no answer, no ability to leave message as did not go to voicemail, no sound on line present after stopped ringing.  Will attempt again in morning and instruct patient to go to ER for electrolyte imbalance.

## 2019-06-11 NOTE — ED Triage Notes (Signed)
Pt to ED via POV, pt states that she had lab work done yesterday and got a call this morning from NP on call that she needed to come in because her Na+ was 115. Pt has had chronic diarrhea x 6 months. Pt was admitted in October due to low Na+. Pt weak and c/o severe abd pain. Pt is in NAD.

## 2019-06-12 ENCOUNTER — Encounter: Payer: Self-pay | Admitting: Internal Medicine

## 2019-06-12 ENCOUNTER — Other Ambulatory Visit: Payer: Self-pay

## 2019-06-12 DIAGNOSIS — F329 Major depressive disorder, single episode, unspecified: Secondary | ICD-10-CM | POA: Diagnosis present

## 2019-06-12 DIAGNOSIS — Z20822 Contact with and (suspected) exposure to covid-19: Secondary | ICD-10-CM | POA: Diagnosis present

## 2019-06-12 DIAGNOSIS — M50322 Other cervical disc degeneration at C5-C6 level: Secondary | ICD-10-CM | POA: Diagnosis present

## 2019-06-12 DIAGNOSIS — F909 Attention-deficit hyperactivity disorder, unspecified type: Secondary | ICD-10-CM | POA: Diagnosis present

## 2019-06-12 DIAGNOSIS — E871 Hypo-osmolality and hyponatremia: Secondary | ICD-10-CM | POA: Diagnosis present

## 2019-06-12 DIAGNOSIS — Z79891 Long term (current) use of opiate analgesic: Secondary | ICD-10-CM | POA: Diagnosis not present

## 2019-06-12 DIAGNOSIS — E7800 Pure hypercholesterolemia, unspecified: Secondary | ICD-10-CM | POA: Diagnosis present

## 2019-06-12 DIAGNOSIS — E876 Hypokalemia: Secondary | ICD-10-CM

## 2019-06-12 DIAGNOSIS — E669 Obesity, unspecified: Secondary | ICD-10-CM | POA: Diagnosis present

## 2019-06-12 DIAGNOSIS — R197 Diarrhea, unspecified: Secondary | ICD-10-CM | POA: Diagnosis not present

## 2019-06-12 DIAGNOSIS — K529 Noninfective gastroenteritis and colitis, unspecified: Secondary | ICD-10-CM | POA: Diagnosis not present

## 2019-06-12 DIAGNOSIS — Z6829 Body mass index (BMI) 29.0-29.9, adult: Secondary | ICD-10-CM | POA: Diagnosis not present

## 2019-06-12 DIAGNOSIS — Z800 Family history of malignant neoplasm of digestive organs: Secondary | ICD-10-CM | POA: Diagnosis not present

## 2019-06-12 DIAGNOSIS — F419 Anxiety disorder, unspecified: Secondary | ICD-10-CM | POA: Diagnosis present

## 2019-06-12 DIAGNOSIS — I10 Essential (primary) hypertension: Secondary | ICD-10-CM | POA: Diagnosis present

## 2019-06-12 DIAGNOSIS — Z807 Family history of other malignant neoplasms of lymphoid, hematopoietic and related tissues: Secondary | ICD-10-CM | POA: Diagnosis not present

## 2019-06-12 DIAGNOSIS — Z808 Family history of malignant neoplasm of other organs or systems: Secondary | ICD-10-CM | POA: Diagnosis not present

## 2019-06-12 DIAGNOSIS — E878 Other disorders of electrolyte and fluid balance, not elsewhere classified: Secondary | ICD-10-CM | POA: Diagnosis present

## 2019-06-12 DIAGNOSIS — E861 Hypovolemia: Secondary | ICD-10-CM | POA: Diagnosis present

## 2019-06-12 LAB — COMPREHENSIVE METABOLIC PANEL
ALT: 23 U/L (ref 0–44)
AST: 21 U/L (ref 15–41)
Albumin: 3.2 g/dL — ABNORMAL LOW (ref 3.5–5.0)
Alkaline Phosphatase: 46 U/L (ref 38–126)
Anion gap: 5 (ref 5–15)
BUN: 7 mg/dL (ref 6–20)
CO2: 24 mmol/L (ref 22–32)
Calcium: 8.3 mg/dL — ABNORMAL LOW (ref 8.9–10.3)
Chloride: 97 mmol/L — ABNORMAL LOW (ref 98–111)
Creatinine, Ser: 0.91 mg/dL (ref 0.44–1.00)
GFR calc Af Amer: >60 mL/min (ref >60)
GFR calc non Af Amer: >60 mL/min (ref >60)
Glucose, Bld: 111 mg/dL — ABNORMAL HIGH (ref 70–99)
Potassium: 2.1 mmol/L — CL (ref 3.5–5.1)
Sodium: 126 mmol/L — ABNORMAL LOW (ref 135–145)
Total Bilirubin: 0.5 mg/dL (ref 0.3–1.2)
Total Protein: 6.3 g/dL — ABNORMAL LOW (ref 6.5–8.1)

## 2019-06-12 LAB — BASIC METABOLIC PANEL
Anion gap: 9 (ref 5–15)
BUN: 6 mg/dL (ref 6–20)
CO2: 23 mmol/L (ref 22–32)
Calcium: 8.8 mg/dL — ABNORMAL LOW (ref 8.9–10.3)
Chloride: 98 mmol/L (ref 98–111)
Creatinine, Ser: 0.94 mg/dL (ref 0.44–1.00)
GFR calc Af Amer: >60 mL/min (ref >60)
GFR calc non Af Amer: >60 mL/min (ref >60)
Glucose, Bld: 96 mg/dL (ref 70–99)
Potassium: 2.2 mmol/L — CL (ref 3.5–5.1)
Sodium: 130 mmol/L — ABNORMAL LOW (ref 135–145)

## 2019-06-12 LAB — CBC WITH DIFFERENTIAL/PLATELET
Abs Immature Granulocytes: 0.06 10*3/uL (ref 0.00–0.07)
Basophils Absolute: 0.1 10*3/uL (ref 0.0–0.1)
Basophils Relative: 1 %
Eosinophils Absolute: 0 10*3/uL (ref 0.0–0.5)
Eosinophils Relative: 0 %
HCT: 34.9 % — ABNORMAL LOW (ref 36.0–46.0)
Hemoglobin: 12.8 g/dL (ref 12.0–15.0)
Immature Granulocytes: 1 %
Lymphocytes Relative: 32 %
Lymphs Abs: 3 10*3/uL (ref 0.7–4.0)
MCH: 28.8 pg (ref 26.0–34.0)
MCHC: 36.7 g/dL — ABNORMAL HIGH (ref 30.0–36.0)
MCV: 78.4 fL — ABNORMAL LOW (ref 80.0–100.0)
Monocytes Absolute: 1 10*3/uL (ref 0.1–1.0)
Monocytes Relative: 11 %
Neutro Abs: 5.2 10*3/uL (ref 1.7–7.7)
Neutrophils Relative %: 55 %
Platelets: 520 10*3/uL — ABNORMAL HIGH (ref 150–400)
RBC: 4.45 MIL/uL (ref 3.87–5.11)
RDW: 12.3 % (ref 11.5–15.5)
WBC: 9.4 10*3/uL (ref 4.0–10.5)
nRBC: 0 % (ref 0.0–0.2)

## 2019-06-12 LAB — SARS CORONAVIRUS 2 (TAT 6-24 HRS): SARS Coronavirus 2: NEGATIVE

## 2019-06-12 LAB — POCT PREGNANCY, URINE: Preg Test, Ur: NEGATIVE

## 2019-06-12 MED ORDER — SODIUM CHLORIDE 0.9% FLUSH: 10.0000 mL | INTRAVENOUS | Status: DC | PRN

## 2019-06-12 MED ORDER — MAGNESIUM SULFATE 2 GM/50ML IV SOLN
2.0000 g | Freq: Once | INTRAVENOUS | Status: AC
  Administered 2019-06-12: 2 g via INTRAVENOUS
  Filled 2019-06-12: qty 50

## 2019-06-12 MED ORDER — POTASSIUM CHLORIDE CRYS ER 20 MEQ PO TBCR
40.0000 meq | EXTENDED_RELEASE_TABLET | Freq: Once | ORAL | Status: AC
  Administered 2019-06-12: 40 meq via ORAL
  Filled 2019-06-12: qty 2

## 2019-06-12 MED ORDER — SODIUM CHLORIDE 0.9 % IV SOLN
INTRAVENOUS | Status: DC | PRN
  Administered 2019-06-12 – 2019-06-13 (×2): 1000 mL via INTRAVENOUS

## 2019-06-12 MED ORDER — POTASSIUM CHLORIDE IN NACL 40-0.9 MEQ/L-% IV SOLN
INTRAVENOUS | Status: DC
  Administered 2019-06-12 – 2019-06-13 (×3): 75 mL/h via INTRAVENOUS
  Filled 2019-06-12 (×3): qty 1000

## 2019-06-12 MED ORDER — POTASSIUM CHLORIDE CRYS ER 20 MEQ PO TBCR
40.0000 meq | EXTENDED_RELEASE_TABLET | Freq: Once | ORAL | Status: AC
  Administered 2019-06-12: 18:00:00 40 meq via ORAL
  Filled 2019-06-12: qty 2

## 2019-06-12 NOTE — Progress Notes (Signed)
Progress Note    Molly Cisneros  [id number] DOB: [birth year]  DOA: 06/11/2019 PCP: Glean Hess, MD      Brief Narrative:       Assessment/Plan:   Principal Problem:   Hyponatremia Active Problems:   Obesity (BMI 30-39.9)   Degeneration of C5-C6 intervertebral disc   Essential hypertension   Chronic diarrhea of unknown origin   Hypokalemia   Body mass index is 29.99 kg/m.   Hyponatremia: Improving.  Continue IV fluids.  Refractory hypokalemia: Replete potassium intravenously and orally.  Add IV magnesium sulfate.  Chronic diarrhea: Follow-up with gastroenterologist as an outpatient.     Family Communication/Anticipated D/C date and plan/Code Status   DVT prophylaxis: Lovenox Code Status: Full code Family Communication: Plan discussed with patient Disposition Plan: Possible discharge to home in 1 to 2 days      Subjective:   She complains of diarrhea.  She had 3 loose stools this morning.  Objective:    Vitals:   06/12/19 0112 06/12/19 0141 06/12/19 0504 06/12/19 1630  BP: (!) 137/99 118/82 116/72 93/63  Pulse: 99 (!) 114 (!) 101 94  Resp: 19 20 20 16   Temp: 98 F (36.7 C) 98.2 F (36.8 C) 97.7 F (36.5 C) 97.8 F (36.6 C)  TempSrc:  Oral Oral Oral  SpO2: 100% 100% 98% 100%  Weight:      Height:        Intake/Output Summary (Last 24 hours) at 06/12/2019 1730 Last data filed at 06/12/2019 1613 Gross per 24 hour  Intake 1128.13 ml  Output 450 ml  Net 678.13 ml   Filed Weights   06/11/19 0755  Weight: 97.5 kg    Exam:  GEN: NAD SKIN: No rash EYES: EOMI ENT: MMM CV: RRR PULM: CTA B ABD: soft, ND, mild left lower quadrant tenderness without rebound tenderness or guarding, +BS CNS: AAO x 3, non focal EXT: No edema or tenderness   Data Reviewed:   I have personally reviewed following labs and imaging studies:  Labs: Labs show the following:   Basic Metabolic Panel: Recent Labs  Lab 06/11/19 0801 06/11/19 0801  06/11/19 0802 06/11/19 1819 06/11/19 1819 06/12/19 0457 06/12/19 1610  NA 120*  --   --  119*  --  126* 130*  K 2.4*   < >  --  2.0*   < > 2.1* 2.2*  CL 87*  --   --  88*  --  97* 98  CO2 19*  --   --  19*  --  24 23  GLUCOSE 109*  --   --  104*  --  111* 96  BUN 8  --   --  6  --  7 6  CREATININE 1.00  --   --  0.88  --  0.91 0.94  CALCIUM 8.5*  --   --  8.7*  --  8.3* 8.8*  MG  --   --  1.9 1.9  --   --   --    < > = values in this interval not displayed.   GFR Estimated Creatinine Clearance: 107.5 mL/min (by C-G formula based on SCr of 0.94 mg/dL). Liver Function Tests: Recent Labs  Lab 06/11/19 0801 06/12/19 0457  AST 34 21  ALT 23 23  ALKPHOS 53 46  BILITOT 1.0 0.5  PROT 7.2 6.3*  ALBUMIN 3.7 3.2*   Recent Labs  Lab 06/11/19 0801  LIPASE 21   No results for  input(s): AMMONIA in the last 168 hours. Coagulation profile No results for input(s): INR, PROTIME in the last 168 hours.  CBC: Recent Labs  Lab 06/11/19 1014 06/11/19 2106 06/12/19 0457  WBC 12.4* 13.8* 9.4  NEUTROABS  --   --  5.2  HGB 14.5 14.2 12.8  HCT 39.5 38.3 34.9*  MCV 78.4* 77.4* 78.4*  PLT 555* 545* 520*   Cardiac Enzymes: No results for input(s): CKTOTAL, CKMB, CKMBINDEX, TROPONINI in the last 168 hours. BNP (last 3 results) No results for input(s): PROBNP in the last 8760 hours. CBG: No results for input(s): GLUCAP in the last 168 hours. D-Dimer: No results for input(s): DDIMER in the last 72 hours. Hgb A1c: Recent Labs    06/11/19 1819  HGBA1C 5.4   Lipid Profile: No results for input(s): CHOL, HDL, LDLCALC, TRIG, CHOLHDL, LDLDIRECT in the last 72 hours. Thyroid function studies: Recent Labs    06/11/19 1819  TSH 6.013*   Anemia work up: No results for input(s): VITAMINB12, FOLATE, FERRITIN, TIBC, IRON, RETICCTPCT in the last 72 hours. Sepsis Labs: Recent Labs  Lab 06/11/19 1014 06/11/19 2106 06/12/19 0457  WBC 12.4* 13.8* 9.4    Microbiology Recent Results  (from the past 240 hour(s))  SARS CORONAVIRUS 2 (TAT 6-24 HRS) Nasopharyngeal Nasopharyngeal Swab     Status: None   Collection Time: 06/11/19  3:16 PM   Specimen: Nasopharyngeal Swab  Result Value Ref Range Status   SARS Coronavirus 2 NEGATIVE NEGATIVE Final    Comment: (NOTE) SARS-CoV-2 target nucleic acids are NOT DETECTED. The SARS-CoV-2 RNA is generally detectable in upper and lower respiratory specimens during the acute phase of infection. Negative results do not preclude SARS-CoV-2 infection, do not rule out co-infections with other pathogens, and should not be used as the sole basis for treatment or other patient management decisions. Negative results must be combined with clinical observations, patient history, and epidemiological information. The expected result is Negative. Fact Sheet for Patients: SugarRoll.be Fact Sheet for Healthcare Providers: https://www.woods-mathews.com/ This test is not yet approved or cleared by the [place] FDA and  has been authorized for detection and/or diagnosis of SARS-CoV-2 by FDA under an Emergency Use Authorization (EUA). This EUA will remain  in effect (meaning this test can be used) for the duration of the COVID-19 declaration under Section 56 4(b)(1) of the Act, 21 U.S.C. section 360bbb-3(b)(1), unless the authorization is terminated or revoked sooner. Performed at [facility], [place] [address] [place], [place] [address]   C difficile quick scan w PCR reflex     Status: Abnormal   Collection Time: 06/11/19  7:41 PM   Specimen: Stool  Result Value Ref Range Status   C Diff antigen POSITIVE (A) NEGATIVE Final   C Diff toxin NEGATIVE NEGATIVE Final   C Diff interpretation Results are indeterminate. See PCR results.  Final    Comment: Performed at [facility], [address] [place], [place] [address]  C. Diff by PCR, Reflexed     Status: None   Collection Time: 06/11/19   7:41 PM  Result Value Ref Range Status   Toxigenic C. Difficile by PCR NEGATIVE NEGATIVE Final    Comment: Patient is colonized with non toxigenic C. difficile. May not need treatment unless significant symptoms are present. Performed at [facility], [address] [place], [place] [address]     Procedures and diagnostic studies:  No results found.  Medications:   . atorvastatin  20 mg Oral  Daily  . DULoxetine  60 mg Oral Daily  . enoxaparin (LOVENOX) injection  40 mg Subcutaneous Q24H  . multivitamin with minerals  1 tablet Oral Daily  . pantoprazole (PROTONIX) IV  40 mg Intravenous Q24H  . sodium chloride flush  3 mL Intravenous Once  . sucralfate  1 g Oral TID WC & HS  . thiamine  100 mg Oral Daily   Continuous Infusions: . 0.9 % NaCl with KCl 40 mEq / L 75 mL/hr at 06/12/19 1613     LOS: 0 days   Larua Collier  [facility]   *Please refer to [email or web address], password [email or web address] to get updated schedule on who will round on this patient, as hospitalists switch teams weekly. If 7PM-7AM, please contact night-coverage at [email or web address], password [email or web address] for any overnight needs.  06/12/2019, 5:30 PM

## 2019-06-12 NOTE — Plan of Care (Signed)
Continuing with plan of care. 

## 2019-06-12 NOTE — ED Notes (Signed)
ED TO INPATIENT HANDOFF REPORT  ED Nurse Name and Phone #: Balinda Quails Name/Age/Gender Molly Cisneros 34 y.o. female Room/Bed: ED38A/ED38A  Code Status   Code Status: Full Code  Home/SNF/Other Home Patient oriented to: self, place, time and situation Is this baseline? Yes   Triage Complete: Triage complete  Chief Complaint Hyponatremia [E87.1]  Triage Note Pt to ED via POV, pt states that she had lab work done yesterday and got a call this morning from NP on call that she needed to come in because her Na+ was 115. Pt has had chronic diarrhea x 6 months. Pt was admitted in October due to low Na+. Pt weak and c/o severe abd pain. Pt is in NAD.     Allergies Allergies  Allergen Reactions  . Shellfish Allergy Anaphylaxis  . Aloe Vera Dermatitis    Level of Care/Admitting Diagnosis ED Disposition    ED Disposition Condition [facility] Area: [place] [[id number]]  Level of Care: Med-Surg [16]  Covid Evaluation: Asymptomatic Screening Protocol (No Symptoms)  Diagnosis: Hyponatremia [010272]  Admitting Physician: Cherylann Ratel  Attending Physician: Cherylann Ratel       B Medical/Surgery History Past Medical History:  Diagnosis Date  . Acute appendicitis with localized peritonitis 11/06/2018  . Anxiety   . C. difficile diarrhea 02/05/2019  . Clostridium difficile diarrhea   . High cholesterol   . Hypertension   . Hypokalemia 02/05/2019  . Loss of weight   . Severe sepsis ([place]) 02/05/2019  . Shingles    reoccuring shingles on face   Past Surgical History:  Procedure Laterality Date  . COLONOSCOPY WITH PROPOFOL N/A 12/23/2018   Procedure: COLONOSCOPY WITH PROPOFOL;  Surgeon: Lin Landsman, MD;  Location: [facility] ENDOSCOPY;  Service: Gastroenterology;  Laterality: N/A;  . ESOPHAGOGASTRODUODENOSCOPY (EGD) WITH PROPOFOL N/A 12/23/2018   Procedure: ESOPHAGOGASTRODUODENOSCOPY (EGD) WITH PROPOFOL;  Surgeon: Lin Landsman, MD;  Location: [facility] ENDOSCOPY;  Service: Gastroenterology;  Laterality: N/A;  . LAPAROSCOPIC APPENDECTOMY N/A 11/06/2018   Procedure: APPENDECTOMY LAPAROSCOPIC;  Surgeon: Herbert Pun, MD;  Location: [facility] ORS;  Service: General;  Laterality: N/A;     A IV Location/Drains/Wounds Patient Lines/Drains/Airways Status   Active Line/Drains/Airways    Name:   Placement date:   Placement time:   Site:   Days:   Peripheral IV 06/11/19 Left Hand   06/11/19    0800    Hand   1          Intake/Output Last 24 hours  Intake/Output Summary (Last 24 hours) at 06/12/2019 0113 Last data filed at 06/11/2019 2149 Gross per 24 hour  Intake 776.47 ml  Output --  Net 776.47 ml    Labs/Imaging Results for orders placed or performed during the hospital encounter of 06/11/19 (from the past 48 hour(s))  Lipase, blood     Status: None   Collection Time: 06/11/19  8:01 AM  Result Value Ref Range   Lipase 21 11 - 51 U/L    Comment: Performed at [facility], [address] [place], [place] [address]  Comprehensive metabolic panel     Status: Abnormal   Collection Time: 06/11/19  8:01 AM  Result Value Ref Range   Sodium 120 (L) 135 - 145 mmol/L   Potassium 2.4 (LL) 3.5 - 5.1 mmol/L    Comment: HEMOLYSIS AT THIS LEVEL MAY AFFECT RESULT CRITICAL RESULT CALLED TO, READ BACK BY AND VERIFIED WITH ANGELA ROBBINS  AT 6568 06/11/19.PMF    Chloride 87 (L) 98 - 111 mmol/L   CO2 19 (L) 22 - 32 mmol/L   Glucose, Bld 109 (H) 70 - 99 mg/dL   BUN 8 6 - 20 mg/dL   Creatinine, Ser 1.00 0.44 - 1.00 mg/dL   Calcium 8.5 (L) 8.9 - 10.3 mg/dL   Total Protein 7.2 6.5 - 8.1 g/dL   Albumin 3.7 3.5 - 5.0 g/dL   AST 34 15 - 41 U/L    Comment: HEMOLYSIS AT THIS LEVEL MAY AFFECT RESULT   ALT 23 0 - 44 U/L   Alkaline Phosphatase 53 38 - 126 U/L   Total Bilirubin 1.0 0.3 - 1.2 mg/dL    Comment: HEMOLYSIS AT THIS LEVEL MAY AFFECT RESULT   GFR calc non Af Amer >60 >60 mL/min   GFR calc Af Amer >60 >60  mL/min   Anion gap 14 5 - 15    Comment: Performed at [facility], [address] [place], [place] [address]  Urinalysis, Complete w Microscopic     Status: Abnormal   Collection Time: 06/11/19  8:01 AM  Result Value Ref Range   Color, Urine STRAW (A) YELLOW   APPearance CLEAR (A) CLEAR   Specific Gravity, Urine 1.001 (L) 1.005 - 1.030   pH 7.0 5.0 - 8.0   Glucose, UA NEGATIVE NEGATIVE mg/dL   Hgb urine dipstick NEGATIVE NEGATIVE   Bilirubin Urine NEGATIVE NEGATIVE   Ketones, ur NEGATIVE NEGATIVE mg/dL   Protein, ur NEGATIVE NEGATIVE mg/dL   Nitrite NEGATIVE NEGATIVE   Leukocytes,Ua NEGATIVE NEGATIVE   WBC, UA 0-5 0 - 5 WBC/hpf   Bacteria, UA NONE SEEN NONE SEEN   Squamous Epithelial / LPF 0-5 0 - 5   Mucus PRESENT     Comment: Performed at [facility], [address] [place], [place] [address]  Pregnancy, urine     Status: None   Collection Time: 06/11/19  8:01 AM  Result Value Ref Range   Preg Test, Ur NEGATIVE NEGATIVE    Comment: Performed at [facility], [address] [place], [place] [address]  Magnesium     Status: None   Collection Time: 06/11/19  8:02 AM  Result Value Ref Range   Magnesium 1.9 1.7 - 2.4 mg/dL    Comment: Performed at [facility], [address] [place], [place] [address]  CBC     Status: Abnormal   Collection Time: 06/11/19 10:14 AM  Result Value Ref Range   WBC 12.4 (H) 4.0 - 10.5 K/uL   RBC 5.04 3.87 - 5.11 MIL/uL   Hemoglobin 14.5 12.0 - 15.0 g/dL   HCT 39.5 36.0 - 46.0 %   MCV 78.4 (L) 80.0 - 100.0 fL   MCH 28.8 26.0 - 34.0 pg   MCHC 36.7 (H) 30.0 - 36.0 g/dL   RDW 12.4 11.5 - 15.5 %   Platelets 555 (H) 150 - 400 K/uL   nRBC 0.0 0.0 - 0.2 %    Comment: Performed at [facility], [address] [place], [place] [address]  SARS CORONAVIRUS 2 (TAT 6-24 HRS) Nasopharyngeal Nasopharyngeal Swab     Status: None   Collection Time: 06/11/19  3:16 PM   Specimen: Nasopharyngeal Swab   Result Value Ref Range   SARS Coronavirus 2 NEGATIVE NEGATIVE    Comment: (NOTE) SARS-CoV-2 target nucleic acids are NOT DETECTED. The SARS-CoV-2 RNA is generally detectable in upper and lower respiratory specimens during the acute  phase of infection. Negative results do not preclude SARS-CoV-2 infection, do not rule out co-infections with other pathogens, and should not be used as the sole basis for treatment or other patient management decisions. Negative results must be combined with clinical observations, patient history, and epidemiological information. The expected result is Negative. Fact Sheet for Patients: SugarRoll.be Fact Sheet for Healthcare Providers: https://www.woods-mathews.com/ This test is not yet approved or cleared by the [place] FDA and  has been authorized for detection and/or diagnosis of SARS-CoV-2 by FDA under an Emergency Use Authorization (EUA). This EUA will remain  in effect (meaning this test can be used) for the duration of the COVID-19 declaration under Section 56 4(b)(1) of the Act, 21 U.S.C. section 360bbb-3(b)(1), unless the authorization is terminated or revoked sooner. Performed at [facility], [place] [address], [place], [place] [address]   ESR     Status: None   Collection Time: 06/11/19  6:19 PM  Result Value Ref Range   Sed Rate 5 0 - 20 mm/hr    Comment: Performed at [facility], [address] [place], [place] [address]  T4, free     Status: Abnormal   Collection Time: 06/11/19  6:19 PM  Result Value Ref Range   Free T4 1.17 (H) 0.61 - 1.12 ng/dL    Comment: (NOTE) Biotin ingestion may interfere with free T4 tests. If the results are inconsistent with the TSH level, previous test results, or the clinical presentation, then consider biotin interference. If needed, order repeat testing after stopping biotin. Performed at [facility], [address]  [place], [place] [address]   TSH     Status: Abnormal   Collection Time: 06/11/19  6:19 PM  Result Value Ref Range   TSH 6.013 (H) 0.350 - 4.500 uIU/mL    Comment: Performed by a 3rd Generation assay with a functional sensitivity of <=0.01 uIU/mL. Performed at [facility], [address] [place], [place] [address]   Gamma GT     Status: None   Collection Time: 06/11/19  6:19 PM  Result Value Ref Range   GGT 32 7 - 50 U/L    Comment: Performed at [facility], [place] [address] [place], [place] [address]  Basic metabolic panel     Status: Abnormal   Collection Time: 06/11/19  6:19 PM  Result Value Ref Range   Sodium 119 (LL) 135 - 145 mmol/L    Comment: CRITICAL RESULT CALLED TO, READ BACK BY AND VERIFIED WITH JENNA Armend Hochstatter ON 06/11/19 AT 2009 [facility]    Potassium 2.0 (LL) 3.5 - 5.1 mmol/L    Comment: CRITICAL RESULT CALLED TO, READ BACK BY AND VERIFIED WITH JENNA Leory Allinson ON 06/11/19 AT 2009 [facility]    Chloride 88 (L) 98 - 111 mmol/L   CO2 19 (L) 22 - 32 mmol/L   Glucose, Bld 104 (H) 70 - 99 mg/dL   BUN 6 6 - 20 mg/dL   Creatinine, Ser 0.88 0.44 - 1.00 mg/dL   Calcium 8.7 (L) 8.9 - 10.3 mg/dL   GFR calc non Af Amer >60 >60 mL/min   GFR calc Af Amer >60 >60 mL/min   Anion gap 12 5 - 15    Comment: Performed at [facility], [address] [place], [place] [address]  Magnesium     Status: None   Collection Time: 06/11/19  6:19 PM  Result Value Ref Range   Magnesium 1.9 1.7 - 2.4 mg/dL  Comment: Performed at [facility], [address] [place], [place] [address]  Hemoglobin A1c     Status: None   Collection Time: 06/11/19  6:19 PM  Result Value Ref Range   Hgb A1c MFr Bld 5.4 4.8 - 5.6 %    Comment: (NOTE) Pre diabetes:          5.7%-6.4% Diabetes:              >6.4% Glycemic control for   <7.0% adults with diabetes    Mean Plasma Glucose 108.28 mg/dL    Comment: Performed at [facility] [address], [place], [place] [address]   C difficile quick scan w PCR reflex     Status: Abnormal   Collection Time: 06/11/19  7:41 PM   Specimen: Stool  Result Value Ref Range   C Diff antigen POSITIVE (A) NEGATIVE   C Diff toxin NEGATIVE NEGATIVE   C Diff interpretation Results are indeterminate. See PCR results.     Comment: Performed at [facility], [address] [place], [place] [address]  C. Diff by PCR, Reflexed     Status: None   Collection Time: 06/11/19  7:41 PM  Result Value Ref Range   Toxigenic C. Difficile by PCR NEGATIVE NEGATIVE    Comment: Patient is colonized with non toxigenic C. difficile. May not need treatment unless significant symptoms are present. Performed at [facility], [address] [place], [place] [address]   Lactoferrin, Fecal,Qualitative     Status: None   Collection Time: 06/11/19  7:42 PM  Result Value Ref Range   Lactoferrin, Fecal, Qual NEGATIVE NEGATIVE    Comment: Performed at [facility], [address] [place], [place] [address]  CBC     Status: Abnormal   Collection Time: 06/11/19  9:06 PM  Result Value Ref Range   WBC 13.8 (H) 4.0 - 10.5 K/uL   RBC 4.95 3.87 - 5.11 MIL/uL   Hemoglobin 14.2 12.0 - 15.0 g/dL   HCT 38.3 36.0 - 46.0 %   MCV 77.4 (L) 80.0 - 100.0 fL   MCH 28.7 26.0 - 34.0 pg   MCHC 37.1 (H) 30.0 - 36.0 g/dL    Comment: REPEATED TO VERIFY CORRECTED FOR COLD AGGLUTININS    RDW 12.3 11.5 - 15.5 %   Platelets 545 (H) 150 - 400 K/uL   nRBC 0.0 0.0 - 0.2 %    Comment: Performed at [facility], [address] [place], [place] [address]   No results found.  Pending Labs Unresulted Labs (From admission, onward)    Start     Ordered   06/18/19 0500  Creatinine, serum  (enoxaparin (LOVENOX)    CrCl >/= 30 ml/min)  Weekly,   STAT    Comments: while on enoxaparin therapy    06/11/19 1431   06/12/19 0500  Comprehensive metabolic panel  Tomorrow morning,   STAT     06/11/19 1732   06/12/19 0500  CBC with  Differential/Platelet  Tomorrow morning,   STAT     06/11/19 1732   06/11/19 1729  ANA w/Reflex if Positive  Once,   STAT     06/11/19 1730   06/11/19 1725  Allergen Panel (27) + IGE  Once,   STAT     06/11/19 1730   06/11/19 1725  Lyme disease, western blot  Add-on,   AD     06/11/19 1730   06/11/19 1725  H. pylori antigen,  stool  Once,   STAT     06/11/19 1730   06/11/19 1725  Pancreatic elastase, fecal  Once,   STAT     06/11/19 1730   06/11/19 1725  GI pathogen panel by PCR, stool  (Gastrointestinal Panel by PCR, Stool                                                                                                                                                     *Does Not include CLOSTRIDIUM DIFFICILE testing.**If CDIFF testing is needed, select the C Difficile Quick Screen w PCR reflex order below)  Once,   STAT     06/11/19 1730   Unscheduled  Occult blood card to lab, stool RN will collect  As needed,   STAT    Question:  Specimen to be collected by:  Answer:  RN will collect   06/11/19 1730          Vitals/Pain Today's Vitals   06/11/19 2153 06/11/19 2220 06/11/19 2252 06/12/19 0112  BP:    (!) 137/99  Pulse:    99  Resp:    19  Temp:    98 F (36.7 C)  TempSrc:      SpO2:    100%  Weight:      Height:      PainSc: 8  8  7       Isolation Precautions Enteric precautions (UV disinfection)  Medications Medications  sodium chloride flush (NS) 0.9 % injection 3 mL (3 mLs Intravenous Not Given 06/11/19 1626)  atorvastatin (LIPITOR) tablet 20 mg (has no administration in time range)  ALPRAZolam (XANAX) tablet 1 mg (1 mg Oral Given 06/11/19 2130)  DULoxetine (CYMBALTA) DR capsule 60 mg (has no administration in time range)  hydrOXYzine (ATARAX/VISTARIL) tablet 25-50 mg (has no administration in time range)  ondansetron (ZOFRAN-ODT) disintegrating tablet 8 mg (8 mg Oral Given 06/11/19 1724)  gabapentin (NEURONTIN) capsule 300 mg (has no administration in time range)   enoxaparin (LOVENOX) injection 40 mg (40 mg Subcutaneous Given 06/11/19 2252)  multivitamin with minerals tablet 1 tablet (1 tablet Oral Given 06/11/19 1625)  thiamine tablet 100 mg (100 mg Oral Given 06/11/19 1625)  0.9 % NaCl with KCl 20 mEq/ L  infusion ( Intravenous New Bag/Given 06/11/19 2221)  pantoprazole (PROTONIX) injection 40 mg (40 mg Intravenous Given 06/11/19 2014)  sucralfate (CARAFATE) tablet 1 g (1 g Oral Given 06/11/19 2251)  morphine 2 MG/ML injection 2 mg (2 mg Intravenous Given 06/11/19 2223)  oxyCODONE (Oxy IR/ROXICODONE) immediate release tablet 5 mg (has no administration in time range)  sodium chloride 0.9 % bolus 1,000 mL (0 mLs Intravenous Stopped 06/11/19 0928)  ondansetron (ZOFRAN) injection 4 mg (4 mg Intravenous Given 06/11/19 0939)  potassium chloride SA (KLOR-CON) CR tablet 40 mEq (40 mEq Oral Given 06/11/19 0939)  0.9 % NaCl with KCl 20 mEq/ L  infusion ( Intravenous Stopped 06/11/19 2150)  morphine 4 MG/ML injection 4 mg (4 mg Intravenous Given 06/11/19 1253)    Mobility walks Low fall risk   Focused Assessments Gastrointestinal   R Recommendations: See Admitting Provider Note  Report given to:   Additional Notes:

## 2019-06-13 ENCOUNTER — Encounter: Payer: Self-pay | Admitting: Gastroenterology

## 2019-06-13 DIAGNOSIS — I10 Essential (primary) hypertension: Secondary | ICD-10-CM

## 2019-06-13 LAB — CBC WITH DIFFERENTIAL/PLATELET
Basophils Absolute: 0 10*3/uL (ref 0.0–0.2)
Basos: 0 %
EOS (ABSOLUTE): 0.1 10*3/uL (ref 0.0–0.4)
Eos: 1 %
Hematocrit: 41.5 % (ref 34.0–46.6)
Hemoglobin: 14.9 g/dL (ref 11.1–15.9)
Immature Grans (Abs): 0.1 10*3/uL (ref 0.0–0.1)
Immature Granulocytes: 1 %
Lymphocytes Absolute: 2.9 10*3/uL (ref 0.7–3.1)
Lymphs: 19 %
MCH: 29.6 pg (ref 26.6–33.0)
MCHC: 35.9 g/dL — ABNORMAL HIGH (ref 31.5–35.7)
MCV: 82 fL (ref 79–97)
Monocytes Absolute: 1.1 10*3/uL — ABNORMAL HIGH (ref 0.1–0.9)
Monocytes: 7 %
Neutrophils Absolute: 11.4 10*3/uL — ABNORMAL HIGH (ref 1.4–7.0)
Neutrophils: 72 %
Platelets: 559 10*3/uL — ABNORMAL HIGH (ref 150–450)
RBC: 5.04 x10E6/uL (ref 3.77–5.28)
RDW: 13.3 % (ref 11.7–15.4)
WBC: 15.6 10*3/uL — ABNORMAL HIGH (ref 3.4–10.8)

## 2019-06-13 LAB — BASIC METABOLIC PANEL
Anion gap: 8 (ref 5–15)
BUN: <5 mg/dL — ABNORMAL LOW (ref 6–20)
CO2: 23 mmol/L (ref 22–32)
Calcium: 8.7 mg/dL — ABNORMAL LOW (ref 8.9–10.3)
Chloride: 102 mmol/L (ref 98–111)
Creatinine, Ser: 0.89 mg/dL (ref 0.44–1.00)
GFR calc Af Amer: >60 mL/min (ref >60)
GFR calc non Af Amer: >60 mL/min (ref >60)
Glucose, Bld: 99 mg/dL (ref 70–99)
Potassium: 2.2 mmol/L — CL (ref 3.5–5.1)
Sodium: 133 mmol/L — ABNORMAL LOW (ref 135–145)

## 2019-06-13 LAB — LACTIC ACID, PLASMA: Lactate, Ven: 27.1 mg/dL — ABNORMAL HIGH (ref 4.8–25.7)

## 2019-06-13 LAB — COMPREHENSIVE METABOLIC PANEL
ALT: 21 IU/L (ref 0–32)
AST: 19 IU/L (ref 0–40)
Albumin/Globulin Ratio: 1.8 (ref 1.2–2.2)
Albumin: 4.4 g/dL (ref 3.8–4.8)
Alkaline Phosphatase: 72 IU/L (ref 39–117)
BUN/Creatinine Ratio: 6 — ABNORMAL LOW (ref 9–23)
BUN: 8 mg/dL (ref 6–20)
Bilirubin Total: 0.5 mg/dL (ref 0.0–1.2)
CO2: 16 mmol/L — ABNORMAL LOW (ref 20–29)
Calcium: 9.4 mg/dL (ref 8.7–10.2)
Chloride: 77 mmol/L — ABNORMAL LOW (ref 96–106)
Creatinine, Ser: 1.27 mg/dL — ABNORMAL HIGH (ref 0.57–1.00)
GFR calc Af Amer: 63 mL/min/{1.73_m2} (ref >59)
GFR calc non Af Amer: 55 mL/min/{1.73_m2} — ABNORMAL LOW (ref >59)
Globulin, Total: 2.4 g/dL (ref 1.5–4.5)
Glucose: 110 mg/dL — ABNORMAL HIGH (ref 65–99)
Potassium: 2.6 mmol/L — ABNORMAL LOW (ref 3.5–5.2)
Sodium: 115 mmol/L — CL (ref 134–144)
Total Protein: 6.8 g/dL (ref 6.0–8.5)

## 2019-06-13 LAB — PHOSPHORUS: Phosphorus: 1.5 mg/dL — ABNORMAL LOW (ref 2.5–4.6)

## 2019-06-13 LAB — MAGNESIUM
Magnesium: 1.7 mg/dL (ref 1.6–2.3)
Magnesium: 2.5 mg/dL — ABNORMAL HIGH (ref 1.7–2.4)

## 2019-06-13 LAB — ANA W/REFLEX IF POSITIVE: Anti Nuclear Antibody (ANA): NEGATIVE

## 2019-06-13 LAB — POTASSIUM: Potassium: 2.4 mmol/L — CL (ref 3.5–5.1)

## 2019-06-13 MED ORDER — POTASSIUM CHLORIDE IN NACL 40-0.9 MEQ/L-% IV SOLN
INTRAVENOUS | Status: DC
  Administered 2019-06-13 – 2019-06-14 (×2): 50 mL/h via INTRAVENOUS
  Filled 2019-06-13 (×2): qty 1000

## 2019-06-13 MED ORDER — ONDANSETRON HCL 4 MG/2ML IJ SOLN
4.0000 mg | Freq: Four times a day (QID) | INTRAMUSCULAR | Status: DC | PRN
  Administered 2019-06-13 – 2019-06-15 (×7): 4 mg via INTRAVENOUS
  Filled 2019-06-13 (×7): qty 2

## 2019-06-13 MED ORDER — K PHOS MONO-SOD PHOS DI & MONO 155-852-130 MG PO TABS
500.0000 mg | ORAL_TABLET | Freq: Three times a day (TID) | ORAL | Status: AC
  Administered 2019-06-13 – 2019-06-14 (×5): 500 mg via ORAL
  Filled 2019-06-13 (×7): qty 2

## 2019-06-13 MED ORDER — POTASSIUM CHLORIDE CRYS ER 20 MEQ PO TBCR
40.0000 meq | EXTENDED_RELEASE_TABLET | ORAL | Status: AC
  Administered 2019-06-13 (×2): 40 meq via ORAL
  Filled 2019-06-13: qty 2

## 2019-06-13 MED ORDER — SIMETHICONE 40 MG/0.6ML PO SUSP
40.0000 mg | Freq: Once | ORAL | Status: AC
  Administered 2019-06-14: 40 mg via ORAL
  Filled 2019-06-13: qty 30
  Filled 2019-06-13 (×2): qty 0.6

## 2019-06-13 MED ORDER — POTASSIUM CHLORIDE 10 MEQ/100ML IV SOLN
10.0000 meq | INTRAVENOUS | Status: AC
  Administered 2019-06-13 (×6): 10 meq via INTRAVENOUS
  Filled 2019-06-13 (×6): qty 100

## 2019-06-13 NOTE — Progress Notes (Signed)
Progress Note    Molly Cisneros  [id number] DOB: 04-04-1986  DOA: 06/11/2019 PCP: Glean Hess, MD      Brief Narrative:       Assessment/Plan:   Principal Problem:   Hyponatremia Active Problems:   Obesity (BMI 30-39.9)   Degeneration of C5-C6 intervertebral disc   Essential hypertension   Chronic diarrhea of unknown origin   Hypokalemia   Body mass index is 29.99 kg/m.   Hyponatremia: Improving.  Continue IV fluids.  Refractory hypokalemia: Patient total of 6 runs of IV potassium chloride overnight.  Potassium only came up to 2.4.  Replete potassium intravenously and orally.    Hypophosphatemia: Replete phosphorus.  Chronic diarrhea: Follow-up with gastroenterologist as an outpatient.     Family Communication/Anticipated D/C date and plan/Code Status   DVT prophylaxis: Lovenox Code Status: Full code Family Communication: Plan discussed with patient Disposition Plan: Possible discharge to home tomorrow     Subjective:   She had about 4 watery stools last night.  No abdominal pain no vomiting.  She still has some nausea.   Objective:    Vitals:   06/12/19 2027 06/13/19 0451 06/13/19 1341 06/13/19 1436  BP: 104/73 120/72 100/68   Pulse: (!) 105 (!) 107 (!) 101 98  Resp: 20 20 16    Temp: 98.3 F (36.8 C) 98.3 F (36.8 C) 98.7 F (37.1 C)   TempSrc: Oral Oral Oral   SpO2: 99% 99% 98%   Weight:      Height:        Intake/Output Summary (Last 24 hours) at 06/13/2019 1551 Last data filed at 06/13/2019 1522 Gross per 24 hour  Intake 2268.21 ml  Output 600 ml  Net 1668.21 ml   Filed Weights   06/11/19 0755  Weight: 97.5 kg    Exam:  GEN: NAD SKIN: No rash EYES: EOMI ENT: MMM CV: RRR PULM: CTA B ABD: soft, ND, NT, +BS CNS: AAO x 3, non focal EXT: No edema or tenderness   Data Reviewed:   I have personally reviewed following labs and imaging studies:  Labs: Labs show the following:   Basic Metabolic  Panel: Recent Labs  Lab 06/11/19 0801 06/11/19 0801 06/11/19 0802 06/11/19 1819 06/11/19 1819 06/12/19 0457 06/12/19 0457 06/12/19 1610 06/12/19 1610 06/13/19 0455 06/13/19 1432  NA 120*  --   --  119*  --  126*  --  130*  --  133*  --   K 2.4*   < >  --  2.0*   < > 2.1*   < > 2.2*   < > 2.2* 2.4*  CL 87*  --   --  88*  --  97*  --  98  --  102  --   CO2 19*  --   --  19*  --  24  --  23  --  23  --   GLUCOSE 109*  --   --  104*  --  111*  --  96  --  99  --   BUN 8  --   --  6  --  7  --  6  --  <5*  --   CREATININE 1.00  --   --  0.88  --  0.91  --  0.94  --  0.89  --   CALCIUM 8.5*  --   --  8.7*  --  8.3*  --  8.8*  --  8.7*  --  MG  --   --  1.9 1.9  --   --   --   --   --  2.5*  --   PHOS  --   --   --   --   --   --   --   --   --  1.5*  --    < > = values in this interval not displayed.   GFR Estimated Creatinine Clearance: 113.5 mL/min (by C-G formula based on SCr of 0.89 mg/dL). Liver Function Tests: Recent Labs  Lab 06/11/19 0801 06/12/19 0457  AST 34 21  ALT 23 23  ALKPHOS 53 46  BILITOT 1.0 0.5  PROT 7.2 6.3*  ALBUMIN 3.7 3.2*   Recent Labs  Lab 06/11/19 0801  LIPASE 21   No results for input(s): AMMONIA in the last 168 hours. Coagulation profile No results for input(s): INR, PROTIME in the last 168 hours.  CBC: Recent Labs  Lab 06/11/19 1014 06/11/19 2106 06/12/19 0457  WBC 12.4* 13.8* 9.4  NEUTROABS  --   --  5.2  HGB 14.5 14.2 12.8  HCT 39.5 38.3 34.9*  MCV 78.4* 77.4* 78.4*  PLT 555* 545* 520*   Cardiac Enzymes: No results for input(s): CKTOTAL, CKMB, CKMBINDEX, TROPONINI in the last 168 hours. BNP (last 3 results) No results for input(s): PROBNP in the last 8760 hours. CBG: No results for input(s): GLUCAP in the last 168 hours. D-Dimer: No results for input(s): DDIMER in the last 72 hours. Hgb A1c: Recent Labs    06/11/19 1819  HGBA1C 5.4   Lipid Profile: No results for input(s): CHOL, HDL, LDLCALC, TRIG, CHOLHDL,  LDLDIRECT in the last 72 hours. Thyroid function studies: Recent Labs    06/11/19 1819  TSH 6.013*   Anemia work up: No results for input(s): VITAMINB12, FOLATE, FERRITIN, TIBC, IRON, RETICCTPCT in the last 72 hours. Sepsis Labs: Recent Labs  Lab 06/11/19 1014 06/11/19 2106 06/12/19 0457  WBC 12.4* 13.8* 9.4    Microbiology Recent Results (from the past 240 hour(s))  SARS CORONAVIRUS 2 (TAT 6-24 HRS) Nasopharyngeal Nasopharyngeal Swab     Status: None   Collection Time: 06/11/19  3:16 PM   Specimen: Nasopharyngeal Swab  Result Value Ref Range Status   SARS Coronavirus 2 NEGATIVE NEGATIVE Final    Comment: (NOTE) SARS-CoV-2 target nucleic acids are NOT DETECTED. The SARS-CoV-2 RNA is generally detectable in upper and lower respiratory specimens during the acute phase of infection. Negative results do not preclude SARS-CoV-2 infection, do not rule out co-infections with other pathogens, and should not be used as the sole basis for treatment or other patient management decisions. Negative results must be combined with clinical observations, patient history, and epidemiological information. The expected result is Negative. Fact Sheet for Patients: SugarRoll.be Fact Sheet for Healthcare Providers: https://www.woods-mathews.com/ This test is not yet approved or cleared by the [place] FDA and  has been authorized for detection and/or diagnosis of SARS-CoV-2 by FDA under an Emergency Use Authorization (EUA). This EUA will remain  in effect (meaning this test can be used) for the duration of the COVID-19 declaration under Section 56 4(b)(1) of the Act, 21 U.S.C. section 360bbb-3(b)(1), unless the authorization is terminated or revoked sooner. Performed at [facility], [place] [address] [place], [place] [address]   C difficile quick scan w PCR reflex     Status: Abnormal   Collection Time: 06/11/19  7:41 PM   Specimen: Stool   Result Value  Ref Range Status   C Diff antigen POSITIVE (A) NEGATIVE Final   C Diff toxin NEGATIVE NEGATIVE Final   C Diff interpretation Results are indeterminate. See PCR results.  Final    Comment: Performed at [facility], [address] [place], [place] [address]  C. Diff by PCR, Reflexed     Status: None   Collection Time: 06/11/19  7:41 PM  Result Value Ref Range Status   Toxigenic C. Difficile by PCR NEGATIVE NEGATIVE Final    Comment: Patient is colonized with non toxigenic C. difficile. May not need treatment unless significant symptoms are present. Performed at [facility], [address] [place], [place] [address]     Procedures and diagnostic studies:  No results found.  Medications:   . atorvastatin  20 mg Oral Daily  . DULoxetine  60 mg Oral Daily  . enoxaparin (LOVENOX) injection  40 mg Subcutaneous Q24H  . multivitamin with minerals  1 tablet Oral Daily  . pantoprazole (PROTONIX) IV  40 mg Intravenous Q24H  . phosphorus  500 mg Oral TID  . potassium chloride  40 mEq Oral Q4H  . sodium chloride flush  3 mL Intravenous Once  . sucralfate  1 g Oral TID WC & HS  . thiamine  100 mg Oral Daily   Continuous Infusions: . sodium chloride Stopped (06/13/19 1335)  . 0.9 % NaCl with KCl 40 mEq / L       LOS: 1 day   Lavalle Skoda  [facility]   *Please refer to [email or web address], password [email or web address] to get updated schedule on who will round on this patient, as hospitalists switch teams weekly. If 7PM-7AM, please contact night-coverage at [email or web address], password [email or web address] for any overnight needs.  06/13/2019, 3:51 PM

## 2019-06-14 ENCOUNTER — Encounter
Admission: EM | Disposition: A | Discharge status: Home / Self Care | Payer: Self-pay | Source: Home / Self Care | Attending: Internal Medicine

## 2019-06-14 DIAGNOSIS — R197 Diarrhea, unspecified: Secondary | ICD-10-CM

## 2019-06-14 HISTORY — PX: GIVENS CAPSULE STUDY: SHX5432

## 2019-06-14 LAB — ALLERGEN PANEL (27) + IGE
Alternaria Alternata IgE: <0.1 kU/L
Aspergillus Fumigatus IgE: <0.1 kU/L
Bahia Grass IgE: <0.1 kU/L
Bermuda Grass IgE: <0.1 kU/L
Cat Dander IgE: <0.1 kU/L
Cedar, Mountain IgE: <0.1 kU/L
Cladosporium Herbarum IgE: <0.1 kU/L
Cocklebur IgE: <0.1 kU/L
Cockroach, American IgE: <0.1 kU/L
Common Silver Birch IgE: <0.1 kU/L
D Farinae IgE: <0.1 kU/L
D Pteronyssinus IgE: <0.1 kU/L
Dog Dander IgE: <0.1 kU/L
Elm, American IgE: <0.1 kU/L
Hickory, White IgE: <0.1 kU/L
IgE (Immunoglobulin E), Serum: 4 IU/mL — ABNORMAL LOW (ref 6–495)
Johnson Grass IgE: <0.1 kU/L
Kentucky Bluegrass IgE: <0.1 kU/L
Maple/Box Elder IgE: <0.1 kU/L
Mucor Racemosus IgE: <0.1 kU/L
Oak, White IgE: <0.1 kU/L
Penicillium Chrysogen IgE: <0.1 kU/L
Pigweed, Rough IgE: <0.1 kU/L
Plantain, English IgE: <0.1 kU/L
Ragweed, Short IgE: <0.1 kU/L
Setomelanomma Rostrat: <0.1 kU/L
Timothy Grass IgE: <0.1 kU/L
White Mulberry IgE: <0.1 kU/L

## 2019-06-14 LAB — BASIC METABOLIC PANEL
Anion gap: 9 (ref 5–15)
BUN: <5 mg/dL — ABNORMAL LOW (ref 6–20)
CO2: 21 mmol/L — ABNORMAL LOW (ref 22–32)
Calcium: 8.4 mg/dL — ABNORMAL LOW (ref 8.9–10.3)
Chloride: 101 mmol/L (ref 98–111)
Creatinine, Ser: 0.83 mg/dL (ref 0.44–1.00)
GFR calc Af Amer: >60 mL/min (ref >60)
GFR calc non Af Amer: >60 mL/min (ref >60)
Glucose, Bld: 87 mg/dL (ref 70–99)
Potassium: 2.1 mmol/L — CL (ref 3.5–5.1)
Sodium: 131 mmol/L — ABNORMAL LOW (ref 135–145)

## 2019-06-14 LAB — POTASSIUM: Potassium: 2.4 mmol/L — CL (ref 3.5–5.1)

## 2019-06-14 LAB — PANCREATIC ELASTASE, FECAL

## 2019-06-14 LAB — PHOSPHORUS: Phosphorus: 2 mg/dL — ABNORMAL LOW (ref 2.5–4.6)

## 2019-06-14 SURGERY — IMAGING PROCEDURE, GI TRACT, INTRALUMINAL, VIA CAPSULE

## 2019-06-14 MED ORDER — POTASSIUM CHLORIDE 10 MEQ/100ML IV SOLN
10.0000 meq | INTRAVENOUS | Status: AC
  Administered 2019-06-14 (×6): 10 meq via INTRAVENOUS
  Filled 2019-06-14 (×7): qty 100

## 2019-06-14 MED ORDER — POTASSIUM CHLORIDE CRYS ER 20 MEQ PO TBCR: 40.0000 meq | EXTENDED_RELEASE_TABLET | Freq: Three times a day (TID) | ORAL | Status: DC

## 2019-06-14 MED ORDER — POTASSIUM CHLORIDE 10 MEQ/100ML IV SOLN: 10.0000 meq | INTRAVENOUS | Status: DC

## 2019-06-14 MED ORDER — POTASSIUM CHLORIDE 10 MEQ/100ML IV SOLN
10.0000 meq | INTRAVENOUS | Status: AC
  Administered 2019-06-14 – 2019-06-15 (×3): 10 meq via INTRAVENOUS
  Filled 2019-06-14 (×3): qty 100

## 2019-06-14 MED ORDER — POTASSIUM CHLORIDE CRYS ER 20 MEQ PO TBCR: 40.0000 meq | EXTENDED_RELEASE_TABLET | ORAL | Status: DC

## 2019-06-14 NOTE — Progress Notes (Signed)
Dr. Mal Misty notified via secure chat of critical K+ 2.1;Barbaraann Faster, RN 06/14/2019 7:27 AM

## 2019-06-14 NOTE — Progress Notes (Signed)
Lab called with a critical value of K+ of 2.4.  I notified Dr. Mal Misty of the value.  He acknowledged and said he would put orders in.

## 2019-06-14 NOTE — TOC Initial Note (Signed)
Transition of Care [facility]) - Initial/Assessment Note    Patient Details  Name: Molly Cisneros MRN: [id number] Date of Birth: 08/25/1985  Transition of Care [facility]) CM/SW Contact:    Beverly Sessions, RN Phone Number: 06/14/2019, 1:21 PM  Clinical Narrative:                 Patient admitted from home with hyponatremia  Patient has been admitted for the same in the past.  Previous admissions patient has discharged on dificid, and oral vanc  PCP [place].  States she has reliable transportation [place] - denies issues obtaining medications  Patient lives at home with parents  Capsule study in process.  Please consult for any discharge needs   Expected Discharge Plan: Home/Self Care     Patient Goals and CMS Choice        Expected Discharge Plan and Services Expected Discharge Plan: Home/Self Care   Discharge Planning Services: CM Consult   Living arrangements for the past 2 months: Single Family Home                                      Prior Living Arrangements/Services Living arrangements for the past 2 months: Single Family Home Lives with:: Parents Patient language and need for interpreter reviewed:: Yes Do you feel safe going back to the place where you live?: Yes      Need for Family Participation in Patient Care: No (Comment) Care giver support system in place?: Yes (comment)   Criminal Activity/Legal Involvement Pertinent to Current Situation/Hospitalization: No - Comment as needed  Activities of Daily Living Home Assistive Devices/Equipment: None ADL Screening (condition at time of admission) Patient's cognitive ability adequate to safely complete daily activities?: Yes Is the patient deaf or have difficulty hearing?: No Does the patient have difficulty seeing, even when wearing glasses/contacts?: No Does the patient have difficulty concentrating, remembering, or making decisions?: No Patient able to express need for assistance with ADLs?:  Yes Does the patient have difficulty dressing or bathing?: No Independently performs ADLs?: Yes (appropriate for developmental age) Does the patient have difficulty walking or climbing stairs?: No Weakness of Legs: None Weakness of Arms/Hands: None  Permission Sought/Granted                  Emotional Assessment Appearance:: Appears stated age Attitude/Demeanor/Rapport: Gracious Affect (typically observed): Accepting Orientation: : Oriented to Self, Oriented to Place, Oriented to  Time, Oriented to Situation   Psych Involvement: No (comment)  Admission diagnosis:  Hypokalemia [E87.6] Hypochloremia [E87.8] Hyponatremia [E87.1] Diarrhea in adult patient [R19.7] Patient Active Problem List   Diagnosis Date Noted  . Hyponatremia 02/13/2019  . Hypokalemia 02/05/2019  . Chronic diarrhea of unknown origin   . Essential hypertension 11/03/2018  . Hyperlipidemia, mixed 11/03/2018  . Degeneration of C5-C6 intervertebral disc 08/31/2018  . Herniation of left side of L4-L5 intervertebral disc 06/05/2017  . Obesity (BMI 30-39.9) 06/05/2017  . Chronic left-sided low back pain without sciatica 07/23/2015   PCP:  Glean Hess, MD Pharmacy:   [facility], [place] - [place] [address] Phone: [phone] Fax: [phone]  [id number] [place], [address] [place] [address] Phone: [phone] Fax: [phone]  [id number] [place], [place]  [place] Phone: [phone] Fax: [phone]     Social Determinants of Health (SDOH) Interventions    Readmission Risk Interventions Readmission Risk Prevention Plan 06/14/2019  Palliative Care Screening Not Applicable  Medication Review (RN Care Manager) Complete  Some recent data might be hidden

## 2019-06-14 NOTE — Progress Notes (Signed)
Progress Note    Molly Cisneros  [id number] DOB: Nov 14, 1985  DOA: 06/11/2019 PCP: Glean Hess, MD      Brief Narrative:   Ms. Molly Cisneros is a 34 year old woman with history of depression, ADHD, chronic diarrhea (about 6 months and work-up ongoing), history of C. difficile infection, who was brought to the hospital because of generalized weakness and abnormal labs.  She was found to have severe hyponatremia and hypokalemia.    Assessment/Plan:   Principal Problem:   Hyponatremia Active Problems:   Obesity (BMI 30-39.9)   Degeneration of C5-C6 intervertebral disc   Essential hypertension   Chronic diarrhea of unknown origin   Hypokalemia   Body mass index is 29.99 kg/m.   Hyponatremia: Overall, sodium level has improved with low sodium level trended down today.  Hold IV fluids.  Refractory hypokalemia: Suspect patient has poor absorption of oral potassium chloride because of chronic diarrhea.  Repeat 6 runs of IV potassium chloride today.  Monitor potassium level closely.  Hypophosphatemia: Slowly improving.  Replete phosphorus.  Chronic diarrhea: Patient swallowed the capsule for capsule endoscopy today.  Follow-up with gastroenterologist as an outpatient.     Family Communication/Anticipated D/C date and plan/Code Status   DVT prophylaxis: Lovenox Code Status: Full code Family Communication: Plan discussed with patient Disposition Plan: Possible discharge to home in 1 to 2 days     Subjective:   She had diarrhea last night. No other complaints. She swallowed capsule for capsule endoscopy this morning   Objective:    Vitals:   06/13/19 2210 06/14/19 0643 06/14/19 0648 06/14/19 0734  BP:  93/67 95/68   Pulse: 100 (!) 114 (!) 105 98  Resp:  16    Temp:  98.4 F (36.9 C)    TempSrc:  Oral    SpO2:  97%    Weight:      Height:        Intake/Output Summary (Last 24 hours) at 06/14/2019 1003 Last data filed at 06/14/2019 [id number] Gross per 24  hour  Intake 1635.13 ml  Output 600 ml  Net 1035.13 ml   Filed Weights   06/11/19 0755  Weight: 97.5 kg    Exam:  GEN: NAD SKIN: No rash EYES: EOMI ENT: MMM CV: RRR PULM: CTA B ABD: soft, ND, NT, +BS, wearing belt and device for capsule endoscopy CNS: AAO x 3, non focal EXT: No edema or tenderness    Data Reviewed:   I have personally reviewed following labs and imaging studies:  Labs: Labs show the following:   Basic Metabolic Panel: Recent Labs  Lab 06/10/19 0917 06/11/19 0801 06/11/19 0802 06/11/19 1819 06/11/19 1819 06/12/19 0457 06/12/19 0457 06/12/19 1610 06/12/19 1610 06/13/19 0455 06/13/19 0455 06/13/19 1432 06/14/19 0423  NA 115*   < >  --  119*  --  126*  --  130*  --  133*  --   --  131*  K 2.6*   < >  --  2.0*   < > 2.1*   < > 2.2*   < > 2.2*   < > 2.4* 2.1*  CL 77*   < >  --  88*  --  97*  --  98  --  102  --   --  101  CO2 16*   < >  --  19*  --  24  --  23  --  23  --   --  21*  GLUCOSE  110*   < >  --  104*  --  111*  --  96  --  99  --   --  87  BUN 8   < >  --  6  --  7  --  6  --  <5*  --   --  <5*  CREATININE 1.27*   < >  --  0.88  --  0.91  --  0.94  --  0.89  --   --  0.83  CALCIUM 9.4   < >  --  8.7*  --  8.3*  --  8.8*  --  8.7*  --   --  8.4*  MG 1.7  --  1.9 1.9  --   --   --   --   --  2.5*  --   --   --   PHOS  --   --   --   --   --   --   --   --   --  1.5*  --   --  2.0*   < > = values in this interval not displayed.   GFR Estimated Creatinine Clearance: 121.7 mL/min (by C-G formula based on SCr of 0.83 mg/dL). Liver Function Tests: Recent Labs  Lab 06/10/19 0917 06/11/19 0801 06/12/19 0457  AST 19 34 21  ALT 21 23 23   ALKPHOS 72 53 46  BILITOT 0.5 1.0 0.5  PROT 6.8 7.2 6.3*  ALBUMIN 4.4 3.7 3.2*   Recent Labs  Lab 06/11/19 0801  LIPASE 21   No results for input(s): AMMONIA in the last 168 hours. Coagulation profile No results for input(s): INR, PROTIME in the last 168 hours.  CBC: Recent Labs  Lab  06/10/19 0917 06/11/19 1014 06/11/19 2106 06/12/19 0457  WBC 15.6* 12.4* 13.8* 9.4  NEUTROABS 11.4*  --   --  5.2  HGB 14.9 14.5 14.2 12.8  HCT 41.5 39.5 38.3 34.9*  MCV 82 78.4* 77.4* 78.4*  PLT 559* 555* 545* 520*   Cardiac Enzymes: No results for input(s): CKTOTAL, CKMB, CKMBINDEX, TROPONINI in the last 168 hours. BNP (last 3 results) No results for input(s): PROBNP in the last 8760 hours. CBG: No results for input(s): GLUCAP in the last 168 hours. D-Dimer: No results for input(s): DDIMER in the last 72 hours. Hgb A1c: Recent Labs    06/11/19 1819  HGBA1C 5.4   Lipid Profile: No results for input(s): CHOL, HDL, LDLCALC, TRIG, CHOLHDL, LDLDIRECT in the last 72 hours. Thyroid function studies: Recent Labs    06/11/19 1819  TSH 6.013*   Anemia work up: No results for input(s): VITAMINB12, FOLATE, FERRITIN, TIBC, IRON, RETICCTPCT in the last 72 hours. Sepsis Labs: Recent Labs  Lab 06/10/19 0917 06/11/19 1014 06/11/19 2106 06/12/19 0457  WBC 15.6* 12.4* 13.8* 9.4    Microbiology Recent Results (from the past 240 hour(s))  SARS CORONAVIRUS 2 (TAT 6-24 HRS) Nasopharyngeal Nasopharyngeal Swab     Status: None   Collection Time: 06/11/19  3:16 PM   Specimen: Nasopharyngeal Swab  Result Value Ref Range Status   SARS Coronavirus 2 NEGATIVE NEGATIVE Final    Comment: (NOTE) SARS-CoV-2 target nucleic acids are NOT DETECTED. The SARS-CoV-2 RNA is generally detectable in upper and lower respiratory specimens during the acute phase of infection. Negative results do not preclude SARS-CoV-2 infection, do not rule out co-infections with other pathogens, and should not be used as the sole basis for treatment or other patient  management decisions. Negative results must be combined with clinical observations, patient history, and epidemiological information. The expected result is Negative. Fact Sheet for Patients: SugarRoll.be Fact Sheet for  Healthcare Providers: https://www.woods-mathews.com/ This test is not yet approved or cleared by the [place] FDA and  has been authorized for detection and/or diagnosis of SARS-CoV-2 by FDA under an Emergency Use Authorization (EUA). This EUA will remain  in effect (meaning this test can be used) for the duration of the COVID-19 declaration under Section 56 4(b)(1) of the Act, 21 U.S.C. section 360bbb-3(b)(1), unless the authorization is terminated or revoked sooner. Performed at [facility], [place] [address] [place], [place] [address]   C difficile quick scan w PCR reflex     Status: Abnormal   Collection Time: 06/11/19  7:41 PM   Specimen: Stool  Result Value Ref Range Status   C Diff antigen POSITIVE (A) NEGATIVE Final   C Diff toxin NEGATIVE NEGATIVE Final   C Diff interpretation Results are indeterminate. See PCR results.  Final    Comment: Performed at [facility], [address] [place], [place] [address]  C. Diff by PCR, Reflexed     Status: None   Collection Time: 06/11/19  7:41 PM  Result Value Ref Range Status   Toxigenic C. Difficile by PCR NEGATIVE NEGATIVE Final    Comment: Patient is colonized with non toxigenic C. difficile. May not need treatment unless significant symptoms are present. Performed at [facility], [address] [place], [place] [address]     Procedures and diagnostic studies:  No results found.  Medications:   . atorvastatin  20 mg Oral Daily  . DULoxetine  60 mg Oral Daily  . enoxaparin (LOVENOX) injection  40 mg Subcutaneous Q24H  . multivitamin with minerals  1 tablet Oral Daily  . pantoprazole (PROTONIX) IV  40 mg Intravenous Q24H  . phosphorus  500 mg Oral TID  . sucralfate  1 g Oral TID WC & HS  . thiamine  100 mg Oral Daily   Continuous Infusions: . sodium chloride Stopped (06/13/19 1335)  . potassium chloride 10 mEq (06/14/19 0944)     LOS: 2 days   Quashaun Lazalde  [facility]   *Please refer to [email or web address], password [email or web address] to get updated schedule on who will round on this patient, as hospitalists switch teams weekly. If 7PM-7AM, please contact night-coverage at [email or web address], password [email or web address] for any overnight needs.  06/14/2019, 10:03 AM

## 2019-06-15 ENCOUNTER — Encounter: Payer: Self-pay | Admitting: Gastroenterology

## 2019-06-15 ENCOUNTER — Encounter: Payer: Self-pay | Admitting: Hematology and Oncology

## 2019-06-15 ENCOUNTER — Encounter: Payer: Self-pay | Admitting: *Deleted

## 2019-06-15 LAB — BASIC METABOLIC PANEL
Anion gap: 7 (ref 5–15)
BUN: <5 mg/dL — ABNORMAL LOW (ref 6–20)
CO2: 20 mmol/L — ABNORMAL LOW (ref 22–32)
Calcium: 8.3 mg/dL — ABNORMAL LOW (ref 8.9–10.3)
Chloride: 107 mmol/L (ref 98–111)
Creatinine, Ser: 0.9 mg/dL (ref 0.44–1.00)
GFR calc Af Amer: >60 mL/min (ref >60)
GFR calc non Af Amer: >60 mL/min (ref >60)
Glucose, Bld: 140 mg/dL — ABNORMAL HIGH (ref 70–99)
Potassium: 2.6 mmol/L — CL (ref 3.5–5.1)
Sodium: 134 mmol/L — ABNORMAL LOW (ref 135–145)

## 2019-06-15 LAB — GI PATHOGEN PANEL BY PCR, STOOL

## 2019-06-15 LAB — VITAMIN B12: Vitamin B-12: 225 pg/mL (ref 180–914)

## 2019-06-15 LAB — IRON AND TIBC
Iron: 45 ug/dL (ref 28–170)
Saturation Ratios: 12 % (ref 10.4–31.8)
TIBC: 367 ug/dL (ref 250–450)
UIBC: 322 ug/dL

## 2019-06-15 LAB — PHOSPHORUS
Phosphorus: <1 mg/dL — CL (ref 2.5–4.6)
Phosphorus: 2.5 mg/dL (ref 2.5–4.6)

## 2019-06-15 LAB — FERRITIN: Ferritin: 37 ng/mL (ref 11–307)

## 2019-06-15 LAB — FOLATE: Folate: 5.2 ng/mL — ABNORMAL LOW (ref >5.9)

## 2019-06-15 LAB — POTASSIUM: Potassium: 2.8 mmol/L — ABNORMAL LOW (ref 3.5–5.1)

## 2019-06-15 LAB — MAGNESIUM: Magnesium: 1.9 mg/dL (ref 1.7–2.4)

## 2019-06-15 MED ORDER — POTASSIUM CHLORIDE 10 MEQ/100ML IV SOLN
10.0000 meq | INTRAVENOUS | Status: AC
  Administered 2019-06-15 (×3): 10 meq via INTRAVENOUS
  Filled 2019-06-15 (×3): qty 100

## 2019-06-15 MED ORDER — POTASSIUM CHLORIDE 10 MEQ/100ML IV SOLN
10.0000 meq | INTRAVENOUS | Status: AC
  Administered 2019-06-15 – 2019-06-16 (×3): 10 meq via INTRAVENOUS
  Filled 2019-06-15 (×2): qty 100

## 2019-06-15 MED ORDER — K PHOS MONO-SOD PHOS DI & MONO 155-852-130 MG PO TABS
500.0000 mg | ORAL_TABLET | Freq: Two times a day (BID) | ORAL | Status: DC
  Administered 2019-06-15 – 2019-06-16 (×3): 500 mg via ORAL
  Filled 2019-06-15 (×4): qty 2

## 2019-06-15 MED ORDER — POLYETHYLENE GLYCOL 3350 17 GM/SCOOP PO POWD
1.0000 | Freq: Once | ORAL | Status: AC
  Administered 2019-06-15: 18:00:00 255 g via ORAL
  Filled 2019-06-15 (×2): qty 255

## 2019-06-15 MED ORDER — POTASSIUM CHLORIDE CRYS ER 20 MEQ PO TBCR
40.0000 meq | EXTENDED_RELEASE_TABLET | ORAL | Status: AC
  Administered 2019-06-15 (×3): 40 meq via ORAL
  Filled 2019-06-15 (×4): qty 2

## 2019-06-15 MED ORDER — POTASSIUM PHOSPHATES 15 MMOLE/5ML IV SOLN
30.0000 mmol | Freq: Once | INTRAVENOUS | Status: AC
  Administered 2019-06-15: 30 mmol via INTRAVENOUS
  Filled 2019-06-15: qty 10

## 2019-06-15 MED ORDER — DICYCLOMINE HCL 20 MG PO TABS
20.0000 mg | ORAL_TABLET | Freq: Three times a day (TID) | ORAL | Status: DC
  Administered 2019-06-15 – 2019-06-16 (×4): 20 mg via ORAL
  Filled 2019-06-15 (×7): qty 1

## 2019-06-15 MED ORDER — DIPHENOXYLATE-ATROPINE 2.5-0.025 MG PO TABS: 2.0000 | ORAL_TABLET | Freq: Four times a day (QID) | ORAL | Status: DC | PRN

## 2019-06-15 MED ORDER — PANTOPRAZOLE SODIUM 40 MG PO TBEC
40.0000 mg | DELAYED_RELEASE_TABLET | Freq: Every day | ORAL | Status: DC
  Administered 2019-06-15 – 2019-06-16 (×2): 40 mg via ORAL
  Filled 2019-06-15 (×2): qty 1

## 2019-06-15 MED ORDER — RIFAXIMIN 550 MG PO TABS
550.0000 mg | ORAL_TABLET | Freq: Two times a day (BID) | ORAL | Status: DC
  Administered 2019-06-15 – 2019-06-16 (×3): 550 mg via ORAL
  Filled 2019-06-15 (×4): qty 1

## 2019-06-15 NOTE — Consult Note (Addendum)
[place] for Electrolyte Monitoring and Replacement   Recent Labs: Potassium (mmol/L)  Date Value  06/15/2019 2.6 (LL)   Magnesium (mg/dL)  Date Value  06/15/2019 1.9   Calcium (mg/dL)  Date Value  06/15/2019 8.3 (L)   Albumin (g/dL)  Date Value  06/12/2019 3.2 (L)  06/10/2019 4.4   Phosphorus (mg/dL)  Date Value  06/15/2019 <1.0 (LL)   Sodium (mmol/L)  Date Value  06/15/2019 134 (L)  06/10/2019 115 (LL)   Corrected Ca: 9.04 mg/dL  Assessment: 34 year old woman with history of depression, ADHD, chronic diarrhea (about 6 months and work-up ongoing), history of C. difficile infection. She has continued to have electrolyte imbalances since admission as a consequence of secretory diarrhea. Over the previous 24 hours she received a total of 120 mEq IV KCl, 6.6 mEq oral KCl, 58 mmol oral phosphorous  Goal of Therapy:  Electrolytes WNL  Plan:   30 mmol IV potassium phosphate (this provides 50mEq IV potassium)  40 mEq oral KCl q4h x 3  Continue K-Phos Neutral 500 mg BID  Repeat potassium and phosphorous at [place] ,PharmD Clinical Pharmacist 06/15/2019 7:55 AM

## 2019-06-15 NOTE — Progress Notes (Signed)
[facility] [place] NAME: Molly Cisneros    MR#:  [id number]  DATE OF BIRTH:  09/13/1985  SUBJECTIVE:   Patient continues to have  8-9 diarrheal stools as per her routine chronically.  She is hydrating herself with Gatorade no vomiting some nausea REVIEW OF SYSTEMS:   Review of Systems  Constitutional: Negative for chills, fever and weight loss.  HENT: Negative for ear discharge, ear pain and nosebleeds.   Eyes: Negative for blurred vision, pain and discharge.  Respiratory: Negative for sputum production, shortness of breath, wheezing and stridor.   Cardiovascular: Negative for chest pain, palpitations, orthopnea and PND.  Gastrointestinal: Positive for diarrhea and nausea. Negative for abdominal pain and vomiting.  Genitourinary: Negative for frequency and urgency.  Musculoskeletal: Negative for back pain and joint pain.  Neurological: Negative for sensory change, speech change, focal weakness and weakness.  Psychiatric/Behavioral: Negative for depression and hallucinations. The patient is not nervous/anxious.    Tolerating Diet: yes Tolerating PT: not needed  DRUG ALLERGIES:   Allergies  Allergen Reactions  . Shellfish Allergy Anaphylaxis  . Aloe Vera Dermatitis    VITALS:  Blood pressure 94/61, pulse (!) 109, temperature 97.6 F (36.4 C), temperature source Oral, resp. rate 20, height 5\' 11"  (1.803 m), weight 97.5 kg, SpO2 98 %.  PHYSICAL EXAMINATION:   Physical Exam  GENERAL:  34 y.o.-year-old patient lying in the bed with no acute distress.  EYES: Pupils equal, round, reactive to light and accommodation. No scleral icterus.   HEENT: Head atraumatic, normocephalic. Oropharynx and nasopharynx clear.  NECK:  Supple, no jugular venous distention. No thyroid enlargement, no tenderness.  LUNGS: Normal breath sounds bilaterally, no wheezing, rales, rhonchi. No use of accessory muscles of respiration.  CARDIOVASCULAR: S1, S2  normal. No murmurs, rubs, or gallops.  ABDOMEN: Soft, nontender, nondistended. Bowel sounds present. No organomegaly or mass.  EXTREMITIES: No cyanosis, clubbing or edema b/l.    NEUROLOGIC: Cranial nerves II through XII are intact. No focal Motor or sensory deficits b/l.   PSYCHIATRIC:  patient is alert and oriented x 3.  SKIN: No obvious rash, lesion, or ulcer.   LABORATORY PANEL:  CBC Recent Labs  Lab 06/12/19 0457  WBC 9.4  HGB 12.8  HCT 34.9*  PLT 520*    Chemistries  Recent Labs  Lab 06/12/19 0457 06/12/19 1610 06/15/19 0650  NA 126*   < > 134*  K 2.1*   < > 2.6*  CL 97*   < > 107  CO2 24   < > 20*  GLUCOSE 111*   < > 140*  BUN 7   < > <5*  CREATININE 0.91   < > 0.90  CALCIUM 8.3*   < > 8.3*  MG  --    < > 1.9  AST 21  --   --   ALT 23  --   --   ALKPHOS 46  --   --   BILITOT 0.5  --   --    < > = values in this interval not displayed.   Cardiac Enzymes No results for input(s): TROPONINI in the last 168 hours. RADIOLOGY:  No results found. ASSESSMENT AND PLAN:  Ms. Molly Cisneros is a 34 year old woman with history of depression, ADHD, chronic diarrhea (about 6 months and work-up ongoing), history of C. difficile infection, who was brought to the hospital because of generalized weakness and abnormal labs.  She was found to have severe  hyponatremia and hypokalemia.  #Hyponatremia:  -Overall, sodium level has improved with low sodium level trended down today.  Hold IV fluids. -Patient hydrating well with Gatorade and fluids  #Refractory hypokalemia:  -Suspect patient has poor absorption of oral potassium chloride because of chronic diarrhea.  Repeat 6 runs of IV potassium chloride today.  Monitor potassium level closely.  #Hypophosphatemia: Slowly improving.  Replete phosphorus. -Phosphorus this morning less than 1.0 -pharmacy consultation placed for replacement  #secretory chronic diarrhea:  -Patient swallowed the capsule for capsule endoscopy  -Dr. Marius Ditch  to review the results -patient has had extensive workup for her diarrhea as outpatient--- essentially workup shows secretory diarrhea-- etiology unclear -continue scheduled bentyl and prn lomotil   #chronic anxiety with history of ADHD -PRN Xanax  Procedures: capsule endoscopy Family communication : patient Consults :curbsided G.I. Discharge Disposition :home CODE STATUS: full DVT Prophylaxis :loveonx Barriers to discharge: electrolyte replacement  TOTAL TIME TAKING CARE OF THIS PATIENT: *25* minutes.  >50% time spent on counselling and coordination of care  Note: This dictation was prepared with Dragon dictation along with smaller phrase technology. Any transcriptional errors that result from this process are unintentional.  [name] M.D    [facility]   CC: Primary care physician; Glean Hess, MDPatient ID: Molly Cisneros, female   DOB: [birth year], 34 y.o.   MRN: [id number]

## 2019-06-15 NOTE — Consult Note (Signed)
[facility] for Electrolyte Monitoring and Replacement   Recent Labs: Potassium (mmol/L)  Date Value  06/15/2019 2.8 (L)   Magnesium (mg/dL)  Date Value  06/15/2019 1.9   Calcium (mg/dL)  Date Value  06/15/2019 8.3 (L)   Albumin (g/dL)  Date Value  06/12/2019 3.2 (L)  06/10/2019 4.4   Phosphorus (mg/dL)  Date Value  06/15/2019 2.5   Sodium (mmol/L)  Date Value  06/15/2019 134 (L)  06/10/2019 115 (LL)   Corrected Ca: 9.04 mg/dL  Assessment: 34 year old woman with history of depression, ADHD, chronic diarrhea (about 6 months and work-up ongoing), history of C. difficile infection. She has continued to have electrolyte imbalances since admission as a consequence of secretory diarrhea. Potassium is still low. [facility] at [facility].   Goal of Therapy:  Electrolytes WNL  Plan:   Will order KCl 10 mEq x 4 IV.   Continue K-Phos Neutral 500 mg BID  F/u with electrolytes with AM labs.   Oswald Hillock ,PharmD, BCPS Clinical Pharmacist 06/15/2019 7:11 PM

## 2019-06-15 NOTE — Progress Notes (Signed)
Capsule study reviewed, incomplete study due to poor prep Will repeat capsule study tomorrow after a bowel prep today Called patient's room and explained to her  We will start rifaximin 550mg  twice daily for possible bacterial overgrowth Agree to continue Lomotil, Bentyl as scheduled Clear liquids today N.p.o. past midnight  [name], MD [place]  [place], [place] [address]  Main: [phone]  Fax: [phone] Pager: [phone]

## 2019-06-16 ENCOUNTER — Encounter
Admission: EM | Disposition: A | Discharge status: Home / Self Care | Payer: Self-pay | Source: Home / Self Care | Attending: Internal Medicine

## 2019-06-16 ENCOUNTER — Other Ambulatory Visit: Payer: Self-pay | Admitting: Gastroenterology

## 2019-06-16 ENCOUNTER — Encounter: Payer: Self-pay | Admitting: *Deleted

## 2019-06-16 ENCOUNTER — Encounter: Payer: Self-pay | Admitting: Gastroenterology

## 2019-06-16 DIAGNOSIS — E878 Other disorders of electrolyte and fluid balance, not elsewhere classified: Secondary | ICD-10-CM

## 2019-06-16 DIAGNOSIS — E669 Obesity, unspecified: Secondary | ICD-10-CM

## 2019-06-16 DIAGNOSIS — K529 Noninfective gastroenteritis and colitis, unspecified: Secondary | ICD-10-CM

## 2019-06-16 HISTORY — PX: GIVENS CAPSULE STUDY: SHX5432

## 2019-06-16 LAB — RENAL FUNCTION PANEL
Albumin: 2.9 g/dL — ABNORMAL LOW (ref 3.5–5.0)
Anion gap: 8 (ref 5–15)
BUN: <5 mg/dL — ABNORMAL LOW (ref 6–20)
CO2: 21 mmol/L — ABNORMAL LOW (ref 22–32)
Calcium: 8.2 mg/dL — ABNORMAL LOW (ref 8.9–10.3)
Chloride: 110 mmol/L (ref 98–111)
Creatinine, Ser: 0.93 mg/dL (ref 0.44–1.00)
GFR calc Af Amer: >60 mL/min (ref >60)
GFR calc non Af Amer: >60 mL/min (ref >60)
Glucose, Bld: 91 mg/dL (ref 70–99)
Phosphorus: 2.3 mg/dL — ABNORMAL LOW (ref 2.5–4.6)
Potassium: 2.9 mmol/L — ABNORMAL LOW (ref 3.5–5.1)
Sodium: 139 mmol/L (ref 135–145)

## 2019-06-16 LAB — POTASSIUM: Potassium: 3.1 mmol/L — ABNORMAL LOW (ref 3.5–5.1)

## 2019-06-16 LAB — H. PYLORI ANTIGEN, STOOL: H. Pylori Stool Ag, Eia: NEGATIVE

## 2019-06-16 LAB — PHOSPHORUS: Phosphorus: 2.3 mg/dL — ABNORMAL LOW (ref 2.5–4.6)

## 2019-06-16 LAB — MAGNESIUM: Magnesium: 1.7 mg/dL (ref 1.7–2.4)

## 2019-06-16 SURGERY — IMAGING PROCEDURE, GI TRACT, INTRALUMINAL, VIA CAPSULE

## 2019-06-16 MED ORDER — POTASSIUM CHLORIDE 20 MEQ PO PACK
80.0000 meq | PACK | Freq: Once | ORAL | Status: AC
  Administered 2019-06-16: 80 meq via ORAL
  Filled 2019-06-16: qty 4

## 2019-06-16 MED ORDER — RIFAXIMIN 550 MG PO TABS: 550.0000 mg | ORAL_TABLET | Freq: Two times a day (BID) | ORAL | 0 refills | Status: DC

## 2019-06-16 MED ORDER — K PHOS MONO-SOD PHOS DI & MONO 155-852-130 MG PO TABS: 500.0000 mg | ORAL_TABLET | Freq: Two times a day (BID) | ORAL | 0 refills | Status: DC

## 2019-06-16 MED ORDER — POTASSIUM CHLORIDE CRYS ER 20 MEQ PO TBCR: 80.0000 meq | EXTENDED_RELEASE_TABLET | Freq: Once | ORAL | Status: DC

## 2019-06-16 NOTE — Consult Note (Signed)
[facility] for Electrolyte Monitoring and Replacement   Recent Labs: Potassium (mmol/L)  Date Value  06/16/2019 3.1 (L)   Magnesium (mg/dL)  Date Value  06/16/2019 1.7   Calcium (mg/dL)  Date Value  06/16/2019 8.2 (L)   Albumin (g/dL)  Date Value  06/16/2019 2.9 (L)  06/10/2019 4.4   Phosphorus (mg/dL)  Date Value  06/16/2019 2.3 (L)  06/16/2019 2.3 (L)   Sodium (mmol/L)  Date Value  06/16/2019 139  06/10/2019 115 (LL)   Corrected Ca: 9.04 mg/dL  Assessment: 34 year old woman with history of depression, ADHD, chronic diarrhea (about 6 months and work-up ongoing), history of C. difficile infection. She has continued to have electrolyte imbalances since admission as a consequence of secretory diarrhea. Over the previous 24 hours she received a total of 40 mEq IV KCl, 120 mEq oral KCl, 58 mmol oral phosphorous  Goal of Therapy:  Electrolytes WNL  Plan:   80 mEq oral KCl x 1  Potassium level following this dose is 3.1 mmol/L  Continue K-Phos Neutral 500 mg BID  The patient is being discharged home   Dallie Piles ,PharmD Clinical Pharmacist 06/16/2019 1:53 PM

## 2019-06-16 NOTE — Discharge Instructions (Signed)
F/u Dr Marius Ditch for discussing you Capsule endoscopy results and MRI abd result (to be done 1/29)

## 2019-06-16 NOTE — TOC Transition Note (Signed)
Transition of Care [facility]) - CM/SW Discharge Note   Patient Details  Name: Molly Cisneros MRN: [id number] Date of Birth: May 11, 1986  [facility]) CM/SW Contact:  Beverly Sessions, RN Phone Number: 06/16/2019, 2:15 PM   Clinical Narrative:     Patient to discharge home today Follow up appointments have been made  Not RNCM needs identified        Patient Goals and CMS Choice        Discharge Placement                       Discharge Plan and Services   Discharge Planning Services: CM Consult                                 Social Determinants of Health (SDOH) Interventions     Readmission Risk Interventions Readmission Risk Prevention Plan 06/16/2019 06/14/2019  Transportation Screening Complete -  PCP or Specialist Appt within 3-5 Days Complete -  HRI or [place] (No Data) -  Palliative Care Screening Not Applicable Not Applicable  Medication Review (RN Care Manager) Complete Complete  Some recent data might be hidden

## 2019-06-16 NOTE — Discharge Summary (Signed)
[facility] [place] NAME: Molly Cisneros    MR#:  [id number]  [place] OF BIRTH:  11-15-1985  DATE OF ADMISSION:  06/11/2019 ADMITTING PHYSICIAN: Jennye Boroughs, MD  DATE OF DISCHARGE: 06/15/2018  PRIMARY CARE PHYSICIAN: Glean Hess, MD    ADMISSION DIAGNOSIS:  Hypokalemia [E87.6] Hypochloremia [E87.8] Hyponatremia [E87.1] Diarrhea in adult patient [R19.7]  DISCHARGE DIAGNOSIS:  Electrolyte Abnormality due to ongoing Secretary diarrhea  SECONDARY DIAGNOSIS:   Past Medical History:  Diagnosis Date  . Acute appendicitis with localized peritonitis 11/06/2018  . Anxiety   . C. difficile diarrhea 02/05/2019  . Clostridium difficile diarrhea   . High cholesterol   . Hypertension   . Hypokalemia 02/05/2019  . Loss of weight   . Severe sepsis ([facility]) 02/05/2019  . Shingles    reoccuring shingles on face    HOSPITAL COURSE:  Molly Cisneros is a 34 year old woman with history of depression, ADHD, chronic diarrhea (about 6 months and work-up ongoing), history of C. difficile infection, who was brought to the hospital because of generalized weakness and abnormal labs. She was found to have severe hyponatremia and hypokalemia.  #Hyponatremia: -Overall, sodium level has improved with low sodium level trended down today. Hold IV fluids. -Patient hydrating well with Gatorade and fluids  #Refractory hypokalemia: -Suspect patient has poor absorption of oral potassium chloride because of chronic diarrhea.  -K upto 3.1 -cont oral K at home  #Hypophosphatemia: Repleted  phosphorus. -Phosphorus this morning less than 1.0--2.5 -pharmacy consultation placed for replacement -K-phosph tabs for 3 more days  #secretory chronic diarrhea: -Patient swallowed the capsule for capsule endoscopy --second time today since results were inconclusive -Dr. Marius Ditch to review the results from todays capsule study as outpt -patient has had extensive workup for  her diarrhea as outpatient--- essentially workup shows secretory diarrhea -continue scheduled bentyl and prn lomotil  -f/u GI as out pt -Rifaximin started by GI  #chronic anxiety with history of ADHD -PRN Xanax  Procedures: capsule endoscopy Family communication : patient Consults :curbsided G.I. Discharge Disposition :home CODE STATUS: full DVT Prophylaxis :loveonx Barriers to discharge: none   D/c home later today once capsule study completed. Pt agreeable   CONSULTS OBTAINED:    DRUG ALLERGIES:   Allergies  Allergen Reactions  . Shellfish Allergy Anaphylaxis  . Aloe Vera Dermatitis    DISCHARGE MEDICATIONS:   Allergies as of 06/16/2019      Reactions   Shellfish Allergy Anaphylaxis   Aloe Vera Dermatitis      Medication List    STOP taking these medications   lisinopril 20 MG tablet Commonly known as: ZESTRIL   traMADol 50 MG tablet Commonly known as: ULTRAM     TAKE these medications   ALPRAZolam 1 MG tablet Commonly known as: XANAX Take 1 mg by mouth 3 (three) times daily as needed for anxiety.   amitriptyline 50 MG tablet Commonly known as: ELAVIL Take 50 mg by mouth at bedtime.   amphetamine-dextroamphetamine 20 MG tablet Commonly known as: ADDERALL Take 20 mg by mouth 2 (two) times daily.   amphetamine-dextroamphetamine 30 MG 24 hr capsule Commonly known as: ADDERALL XR Take 30 mg by mouth daily.   atorvastatin 20 MG tablet Commonly known as: LIPITOR TAKE (1) TABLET BY MOUTH EVERY DAY What changed: See the new instructions.   dicyclomine 20 MG tablet Commonly known as: BENTYL Take 1 tablet (20 mg total) by mouth 3 (three) times daily before meals. What changed: when  to take this   diphenoxylate-atropine 2.5-0.025 MG tablet Commonly known as: LOMOTIL Take 2 tablets by mouth 4 (four) times daily as needed for diarrhea or loose stools.   DULoxetine 60 MG capsule Commonly known as: CYMBALTA Take 60 mg by mouth daily.   gabapentin  300 MG capsule Commonly known as: NEURONTIN TAKE (1) CAPSULE BY MOUTH TWICE DAILY What changed: See the new instructions.   hydrOXYzine 25 MG capsule Commonly known as: VISTARIL TAKE 1 TO 2 CAPSULES BY MOUTH EVERY 6 HOURS What changed: See the new instructions.   ondansetron 8 MG disintegrating tablet Commonly known as: Zofran ODT Take 1 tablet (8 mg total) by mouth every 8 (eight) hours as needed for nausea or vomiting. In place of previous Rx for zofran   pantoprazole 40 MG tablet Commonly known as: PROTONIX TAKE ONE TABLET BY MOUTH TWICE DAILY. 30MINUTES PRIOR TO MEALS What changed: See the new instructions.   phosphorus 155-852-130 MG tablet Commonly known as: K PHOS NEUTRAL Take 2 tablets (500 mg total) by mouth 2 (two) times daily.   promethazine 25 MG tablet Commonly known as: PHENERGAN Take 1 tablet (25 mg total) by mouth at bedtime as needed for nausea or vomiting.   rifaximin 550 MG Tabs tablet Commonly known as: XIFAXAN Take 1 tablet (550 mg total) by mouth 2 (two) times daily.       If you experience worsening of your admission symptoms, develop shortness of breath, life threatening emergency, suicidal or homicidal thoughts you must seek medical attention immediately by calling 911 or calling your MD immediately  if symptoms less severe.  You Must read complete instructions/literature along with all the possible adverse reactions/side effects for all the Medicines you take and that have been prescribed to you. Take any new Medicines after you have completely understood and accept all the possible adverse reactions/side effects.   Please note  You were cared for by a hospitalist during your hospital stay. If you have any questions about your discharge medications or the care you received while you were in the hospital after you are discharged, you can call the unit and asked to speak with the hospitalist on call if the hospitalist that took care of you is not  available. Once you are discharged, your primary care physician will handle any further medical issues. Please note that NO REFILLS for any discharge medications will be authorized once you are discharged, as it is imperative that you return to your primary care physician (or establish a relationship with a primary care physician if you do not have one) for your aftercare needs so that they can reassess your need for medications and monitor your lab values. Today   SUBJECTIVE   No new complaints other than some diarrhea  VITAL SIGNS:  Blood pressure 127/89, pulse (!) 108, temperature 98.2 F (36.8 C), temperature source Oral, resp. rate 18, height 5\' 11"  (1.803 m), weight 97.5 kg, SpO2 99 %.  I/O:    Intake/Output Summary (Last 24 hours) at 06/16/2019 1401 Last data filed at 06/15/2019 2304 Gross per 24 hour  Intake 93.46 ml  Output -  Net 93.46 ml    PHYSICAL EXAMINATION:  GENERAL:  34 y.o.-year-old patient lying in the bed with no acute distress.  EYES: Pupils equal, round, reactive to light and accommodation. No scleral icterus.  HEENT: Head atraumatic, normocephalic. Oropharynx and nasopharynx clear.  NECK:  Supple, no jugular venous distention. No thyroid enlargement, no tenderness.  LUNGS: Normal breath sounds bilaterally,  no wheezing, rales,rhonchi or crepitation. No use of accessory muscles of respiration.  CARDIOVASCULAR: S1, S2 normal. No murmurs, rubs, or gallops.  ABDOMEN: Soft, non-tender, non-distended. Bowel sounds present. No organomegaly or mass.  EXTREMITIES: No pedal edema, cyanosis, or clubbing.  NEUROLOGIC: Cranial nerves II through XII are intact. Muscle strength 5/5 in all extremities. Sensation intact. Gait not checked.  PSYCHIATRIC: patient is alert and oriented x 3.  SKIN: No obvious rash, lesion, or ulcer.   DATA REVIEW:   CBC  Recent Labs  Lab 06/12/19 0457  WBC 9.4  HGB 12.8  HCT 34.9*  PLT 520*    Chemistries  Recent Labs  Lab  06/12/19 0457 06/12/19 1610 06/16/19 0749 06/16/19 0749 06/16/19 1321  NA 126*   < > 139  --   --   K 2.1*   < > 2.9*   < > 3.1*  CL 97*   < > 110  --   --   CO2 24   < > 21*  --   --   GLUCOSE 111*   < > 91  --   --   BUN 7   < > <5*  --   --   CREATININE 0.91   < > 0.93  --   --   CALCIUM 8.3*   < > 8.2*  --   --   MG  --    < > 1.7  --   --   AST 21  --   --   --   --   ALT 23  --   --   --   --   ALKPHOS 46  --   --   --   --   BILITOT 0.5  --   --   --   --    < > = values in this interval not displayed.    Microbiology Results   Recent Results (from the past 240 hour(s))  SARS CORONAVIRUS 2 (TAT 6-24 HRS) Nasopharyngeal Nasopharyngeal Swab     Status: None   Collection Time: 06/11/19  3:16 PM   Specimen: Nasopharyngeal Swab  Result Value Ref Range Status   SARS Coronavirus 2 NEGATIVE NEGATIVE Final    Comment: (NOTE) SARS-CoV-2 target nucleic acids are NOT DETECTED. The SARS-CoV-2 RNA is generally detectable in upper and lower respiratory specimens during the acute phase of infection. Negative results do not preclude SARS-CoV-2 infection, do not rule out co-infections with other pathogens, and should not be used as the sole basis for treatment or other patient management decisions. Negative results must be combined with clinical observations, patient history, and epidemiological information. The expected result is Negative. Fact Sheet for Patients: SugarRoll.be Fact Sheet for Healthcare Providers: https://www.woods-mathews.com/ This test is not yet approved or cleared by the [place] FDA and  has been authorized for detection and/or diagnosis of SARS-CoV-2 by FDA under an Emergency Use Authorization (EUA). This EUA will remain  in effect (meaning this test can be used) for the duration of the COVID-19 declaration under Section 56 4(b)(1) of the Act, 21 U.S.C. section 360bbb-3(b)(1), unless the authorization is  terminated or revoked sooner. Performed at [facility], [place] [address] [place], [place] [address]   GI pathogen panel by PCR, stool     Status: None   Collection Time: 06/11/19  7:41 PM   Specimen: Stool  Result Value Ref Range Status   Plesiomonas shigelloides NOT DETECTED NOT DETECTED Final   Yersinia enterocolitica  NOT DETECTED NOT DETECTED Final   Vibrio NOT DETECTED NOT DETECTED Final   Enteropathogenic E coli NOT DETECTED NOT DETECTED Final   E coli (ETEC) LT/ST NOT DETECTED NOT DETECTED Final   E coli [id number] by PCR Not applicable NOT DETECTED Final   Cryptosporidium by PCR NOT DETECTED NOT DETECTED Final   Entamoeba histolytica NOT DETECTED NOT DETECTED Final   Adenovirus F 40/41 NOT DETECTED NOT DETECTED Final   Norovirus GI/GII NOT DETECTED NOT DETECTED Final   Sapovirus NOT DETECTED NOT DETECTED Final    Comment: (NOTE) Performed At: [facility] [place], [place] [id number] Rush Farmer MD [id number]    Vibrio cholerae NOT DETECTED NOT DETECTED Final   Campylobacter by PCR NOT DETECTED NOT DETECTED Final   Salmonella by PCR NOT DETECTED NOT DETECTED Final   E coli (STEC) NOT DETECTED NOT DETECTED Final   Enteroaggregative E coli NOT DETECTED NOT DETECTED Final   Shigella by PCR NOT DETECTED NOT DETECTED Final   Cyclospora cayetanensis NOT DETECTED NOT DETECTED Final   Astrovirus NOT DETECTED NOT DETECTED Final   G lamblia by PCR NOT DETECTED NOT DETECTED Final   Rotavirus A by PCR NOT DETECTED NOT DETECTED Final  C difficile quick scan w PCR reflex     Status: Abnormal   Collection Time: 06/11/19  7:41 PM   Specimen: Stool  Result Value Ref Range Status   C Diff antigen POSITIVE (A) NEGATIVE Final   C Diff toxin NEGATIVE NEGATIVE Final   C Diff interpretation Results are indeterminate. See PCR results.  Final    Comment: Performed at [facility], [address] [place], [place] [address]  C. Diff by PCR, Reflexed      Status: None   Collection Time: 06/11/19  7:41 PM  Result Value Ref Range Status   Toxigenic C. Difficile by PCR NEGATIVE NEGATIVE Final    Comment: Patient is colonized with non toxigenic C. difficile. May not need treatment unless significant symptoms are present. Performed at [facility], [address], [place], [place] [address]     RADIOLOGY:  No results found.   CODE STATUS:     Code Status Orders  (From admission, onward)         Start     Ordered   06/11/19 1431  Full code  Continuous     06/11/19 1431        Code Status History    Date Active Date Inactive Code Status Order ID Comments User Context   02/13/2019 0034 02/18/2019 1741 Full Code [id number]  Christel Mormon, MD ED   02/05/2019 2230 02/09/2019 1747 Full Code [id number]  Lance Coon, MD Inpatient   11/06/2018 1742 11/06/2018 2253 Full Code [id number]  Herbert Pun, MD ED   Advance Care Planning Activity       TOTAL TIME TAKING CARE OF THIS PATIENT: 40 minutes.    [name] M.D  [facility]    CC: Primary care physician; Glean Hess, MD

## 2019-06-16 NOTE — Progress Notes (Signed)
Discharge order received. Patient mental status is at baseline. Vital signs stable . No signs of acute distress. Discharge instructions given. Patient verbalized understanding. No other issues noted at this time.   

## 2019-06-17 ENCOUNTER — Encounter: Payer: Self-pay | Admitting: Gastroenterology

## 2019-06-17 ENCOUNTER — Inpatient Hospital Stay: Payer: PRIVATE HEALTH INSURANCE | Admitting: Hematology and Oncology

## 2019-06-17 ENCOUNTER — Ambulatory Visit: Payer: PRIVATE HEALTH INSURANCE

## 2019-06-17 ENCOUNTER — Ambulatory Visit: Admission: RE | Admit: 2019-06-17 | Payer: PRIVATE HEALTH INSURANCE | Source: Ambulatory Visit

## 2019-06-17 ENCOUNTER — Other Ambulatory Visit: Payer: Self-pay | Admitting: Internal Medicine

## 2019-06-17 ENCOUNTER — Encounter: Payer: Self-pay | Admitting: Internal Medicine

## 2019-06-17 LAB — COPPER, SERUM: Copper: 142 ug/dL (ref 80–158)

## 2019-06-17 LAB — ZINC: Zinc: 72 ug/dL (ref 44–115)

## 2019-06-17 NOTE — Telephone Encounter (Signed)
Prescription or supplement OTC? Please advise?
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
## 2019-06-21 ENCOUNTER — Inpatient Hospital Stay: Payer: PRIVATE HEALTH INSURANCE

## 2019-06-21 ENCOUNTER — Other Ambulatory Visit: Payer: Self-pay | Admitting: Internal Medicine

## 2019-06-21 ENCOUNTER — Other Ambulatory Visit: Payer: Self-pay

## 2019-06-21 ENCOUNTER — Inpatient Hospital Stay: Payer: PRIVATE HEALTH INSURANCE | Attending: Hematology and Oncology | Admitting: Hematology and Oncology

## 2019-06-21 ENCOUNTER — Encounter: Payer: Self-pay | Admitting: Hematology and Oncology

## 2019-06-21 VITALS — BP 96/68 | HR 125 | Temp 98.5°F | Resp 18 | Ht 71.0 in | Wt 215.4 lb

## 2019-06-21 VITALS — BP 100/69 | HR 109 | Resp 18

## 2019-06-21 DIAGNOSIS — E876 Hypokalemia: Secondary | ICD-10-CM

## 2019-06-21 DIAGNOSIS — D509 Iron deficiency anemia, unspecified: Secondary | ICD-10-CM

## 2019-06-21 DIAGNOSIS — E538 Deficiency of other specified B group vitamins: Secondary | ICD-10-CM | POA: Diagnosis not present

## 2019-06-21 DIAGNOSIS — K529 Noninfective gastroenteritis and colitis, unspecified: Secondary | ICD-10-CM | POA: Diagnosis present

## 2019-06-21 DIAGNOSIS — I951 Orthostatic hypotension: Secondary | ICD-10-CM

## 2019-06-21 DIAGNOSIS — R197 Diarrhea, unspecified: Secondary | ICD-10-CM | POA: Diagnosis not present

## 2019-06-21 DIAGNOSIS — E860 Dehydration: Secondary | ICD-10-CM

## 2019-06-21 LAB — CBC WITH DIFFERENTIAL/PLATELET
Abs Immature Granulocytes: 0.06 10*3/uL (ref 0.00–0.07)
Basophils Absolute: 0.1 10*3/uL (ref 0.0–0.1)
Basophils Relative: 0 %
Eosinophils Absolute: 0.1 10*3/uL (ref 0.0–0.5)
Eosinophils Relative: 1 %
HCT: 37.3 % (ref 36.0–46.0)
Hemoglobin: 13.3 g/dL (ref 12.0–15.0)
Immature Granulocytes: 1 %
Lymphocytes Relative: 22 %
Lymphs Abs: 2.9 10*3/uL (ref 0.7–4.0)
MCH: 29.1 pg (ref 26.0–34.0)
MCHC: 35.7 g/dL (ref 30.0–36.0)
MCV: 81.6 fL (ref 80.0–100.0)
Monocytes Absolute: 0.9 10*3/uL (ref 0.1–1.0)
Monocytes Relative: 7 %
Neutro Abs: 9.3 10*3/uL — ABNORMAL HIGH (ref 1.7–7.7)
Neutrophils Relative %: 69 %
Platelets: 505 10*3/uL — ABNORMAL HIGH (ref 150–400)
RBC: 4.57 MIL/uL (ref 3.87–5.11)
RDW: 13.6 % (ref 11.5–15.5)
WBC: 13.3 10*3/uL — ABNORMAL HIGH (ref 4.0–10.5)
nRBC: 0 % (ref 0.0–0.2)

## 2019-06-21 LAB — BASIC METABOLIC PANEL
Anion gap: 1 — ABNORMAL LOW (ref 5–15)
BUN: 11 mg/dL (ref 6–20)
CO2: 29 mmol/L (ref 22–32)
Calcium: 8.9 mg/dL (ref 8.9–10.3)
Chloride: 99 mmol/L (ref 98–111)
Creatinine, Ser: 0.96 mg/dL (ref 0.44–1.00)
GFR calc Af Amer: >60 mL/min (ref >60)
GFR calc non Af Amer: >60 mL/min (ref >60)
Glucose, Bld: 86 mg/dL (ref 70–99)
Potassium: 2.9 mmol/L — ABNORMAL LOW (ref 3.5–5.1)
Sodium: 129 mmol/L — ABNORMAL LOW (ref 135–145)

## 2019-06-21 MED ORDER — POTASSIUM CHLORIDE 10 MEQ/100ML IV SOLN: 10.0000 meq | INTRAVENOUS | Status: DC

## 2019-06-21 MED ORDER — SODIUM CHLORIDE 0.9 % IV SOLN
Freq: Once | INTRAVENOUS | Status: AC
  Filled 2019-06-21: qty 250

## 2019-06-21 MED ORDER — POTASSIUM CHLORIDE 20 MEQ/15ML (10%) PO SOLN: 20.0000 meq | Freq: Two times a day (BID) | ORAL | 0 refills | Status: DC

## 2019-06-21 MED ORDER — SODIUM CHLORIDE 0.9 % IV SOLN
20.0000 meq | Freq: Once | INTRAVENOUS | Status: AC
  Administered 2019-06-21: 20 meq via INTRAVENOUS
  Filled 2019-06-21: qty 10

## 2019-06-21 NOTE — Progress Notes (Signed)
MD made aware that Potassium is 2.9 and sodium is 129.  Dr. Mike Gip to call Dr. Ron Agee office.  After an hour of fluids, BP 93/64, HR 102 sitting and 85/60, HR 116 standing.  MD made aware.  After 20 meq of KCL and more fluid, BP and HR improved and noted in chart.  Patient verbalized that she will get potassium prescription from Dr. Army Melia.

## 2019-06-21 NOTE — Patient Instructions (Signed)

## 2019-06-21 NOTE — Progress Notes (Signed)
Patient c/o having loose stools ( diarrhea since 08/2018.  The patient also c/o Nausea and vomiting at times when she gets real bad stomach pain. The patient also c/o lightheaded and dizziness with standing and shaking.

## 2019-06-21 NOTE — Patient Instructions (Signed)
  Diarrhea is causing:   Iron deficiency   Begin ferrous sulfate 325 mg by mouth once a day with orange juice.   B12 deficiency  Begin B12 1000 mcg under the tongue once a day.   Folic acid deficiency  Begin folic acid 1 mg by mouth a day.

## 2019-06-22 ENCOUNTER — Encounter: Payer: Self-pay | Admitting: Gastroenterology

## 2019-06-22 ENCOUNTER — Telehealth: Payer: Self-pay

## 2019-06-22 LAB — LYME DISEASE, WESTERN BLOT
IgG P18 Ab.: ABSENT
IgG P23 Ab.: ABSENT
IgG P28 Ab.: ABSENT
IgG P30 Ab.: ABSENT
IgG P39 Ab.: ABSENT
IgG P45 Ab.: ABSENT
IgG P58 Ab.: ABSENT
IgG P66 Ab.: ABSENT
IgG P93 Ab.: ABSENT
IgM P23 Ab.: ABSENT
IgM P39 Ab.: ABSENT
IgM P41 Ab.: ABSENT
Lyme IgG Wb: NEGATIVE
Lyme IgM Wb: NEGATIVE

## 2019-06-22 LAB — CHROMOGRANIN A: Chromogranin A (ng/mL): 106.7 ng/mL — ABNORMAL HIGH (ref 0.0–101.8)

## 2019-06-22 NOTE — Telephone Encounter (Signed)
Labs forwarded to Dr. Army Melia to review per Dr. Kem Parkinson request.

## 2019-06-22 NOTE — Telephone Encounter (Signed)
-----   Message from Lequita Asal, MD sent at 06/22/2019 10:39 AM EST ----- Regarding: Please send to Dr Army Melia  ----- Message ----- From: Buel Ream, Lab In [place] Sent: 06/21/2019  12:49 PM EST To: Lequita Asal, MD

## 2019-06-23 DIAGNOSIS — K529 Noninfective gastroenteritis and colitis, unspecified: Secondary | ICD-10-CM | POA: Diagnosis not present

## 2019-06-28 ENCOUNTER — Other Ambulatory Visit: Payer: Self-pay | Admitting: Hematology and Oncology

## 2019-06-28 ENCOUNTER — Ambulatory Visit
Admission: RE | Admit: 2019-06-28 | Discharge: 2019-06-28 | Disposition: A | Discharge status: Home / Self Care | Payer: PRIVATE HEALTH INSURANCE | Source: Ambulatory Visit | Attending: Gastroenterology | Admitting: Gastroenterology

## 2019-06-28 ENCOUNTER — Other Ambulatory Visit: Payer: Self-pay

## 2019-06-28 ENCOUNTER — Other Ambulatory Visit: Payer: Self-pay | Admitting: Internal Medicine

## 2019-06-28 DIAGNOSIS — R634 Abnormal weight loss: Secondary | ICD-10-CM | POA: Insufficient documentation

## 2019-06-28 DIAGNOSIS — K529 Noninfective gastroenteritis and colitis, unspecified: Secondary | ICD-10-CM

## 2019-06-28 DIAGNOSIS — R197 Diarrhea, unspecified: Secondary | ICD-10-CM | POA: Insufficient documentation

## 2019-06-28 DIAGNOSIS — R1084 Generalized abdominal pain: Secondary | ICD-10-CM | POA: Insufficient documentation

## 2019-06-28 DIAGNOSIS — D3A098 Benign carcinoid tumors of other sites: Secondary | ICD-10-CM

## 2019-06-28 MED ORDER — GADOBUTROL 1 MMOL/ML IV SOLN
9.0000 mL | Freq: Once | INTRAVENOUS | Status: AC | PRN
  Administered 2019-06-28: 9 mL via INTRAVENOUS

## 2019-06-29 LAB — PANCREATIC POLYPEPTIDE: Pancreatic Polypeptide: 148.6 pg/mL (ref 0.0–418.0)

## 2019-06-30 ENCOUNTER — Encounter: Payer: Self-pay | Admitting: Gastroenterology

## 2019-06-30 ENCOUNTER — Telehealth: Payer: Self-pay | Admitting: Pharmacy Technician

## 2019-06-30 ENCOUNTER — Other Ambulatory Visit: Payer: Self-pay | Admitting: Hematology and Oncology

## 2019-06-30 DIAGNOSIS — E538 Deficiency of other specified B group vitamins: Secondary | ICD-10-CM | POA: Insufficient documentation

## 2019-06-30 DIAGNOSIS — R197 Diarrhea, unspecified: Secondary | ICD-10-CM

## 2019-06-30 LAB — 5 HIAA, QUANTITATIVE, URINE, 24 HOUR
5-HIAA, Ur: 1.1 mg/L
5-HIAA,Quant.,24 Hr Urine: 3.2 mg/24 hr (ref 0.0–14.9)
Total Volume: 2900

## 2019-06-30 MED ORDER — OCTREOTIDE ACETATE 100 MCG/ML IJ SOLN: 100.0000 ug | Freq: Three times a day (TID) | INTRAMUSCULAR | 0 refills | Status: DC | PRN

## 2019-06-30 MED ORDER — OCTREOTIDE ACETATE 100 MCG/ML IJ SOLN
100.0000 ug | Freq: Once | INTRAMUSCULAR | Status: DC
  Filled 2019-06-30: qty 1

## 2019-06-30 NOTE — Telephone Encounter (Signed)
Oral Oncology Patient Advocate Encounter  Received notification from [facility]) that prior authorization for Octreotide is required.  PA submitted on CoverMyMeds Key [id number] Status is pending  Oral Oncology Clinic will continue to follow.  [place] Patient [place] Phone [phone] Fax [phone] 06/30/2019 1:46 PM

## 2019-06-30 NOTE — Telephone Encounter (Signed)
Oral Oncology Patient Advocate Encounter  Received notification from [facility]) that the request for prior authorization for Octreotide has been denied due to: diagnosis is considered off-label.     We will reach out the the provider to see how she would like to proceed.    [place] Patient [place] Phone [phone] Fax [phone] 06/30/2019 2:54 PM

## 2019-07-01 ENCOUNTER — Telehealth: Payer: Self-pay

## 2019-07-01 ENCOUNTER — Encounter: Payer: Self-pay | Admitting: Hematology and Oncology

## 2019-07-01 ENCOUNTER — Other Ambulatory Visit: Payer: Self-pay

## 2019-07-01 ENCOUNTER — Other Ambulatory Visit: Payer: Self-pay | Admitting: Internal Medicine

## 2019-07-01 ENCOUNTER — Other Ambulatory Visit
Admission: RE | Admit: 2019-07-01 | Discharge: 2019-07-01 | Disposition: A | Discharge status: Home / Self Care | Payer: PRIVATE HEALTH INSURANCE | Source: Home / Self Care | Attending: Internal Medicine | Admitting: Internal Medicine

## 2019-07-01 ENCOUNTER — Inpatient Hospital Stay
Admission: EM | Admit: 2019-07-01 | Discharge: 2019-07-11 | DRG: 392 | Disposition: A | Discharge status: Home / Self Care | Payer: PRIVATE HEALTH INSURANCE | Attending: Internal Medicine | Admitting: Internal Medicine

## 2019-07-01 ENCOUNTER — Encounter: Payer: Self-pay | Admitting: Emergency Medicine

## 2019-07-01 DIAGNOSIS — E861 Hypovolemia: Secondary | ICD-10-CM | POA: Diagnosis present

## 2019-07-01 DIAGNOSIS — K529 Noninfective gastroenteritis and colitis, unspecified: Secondary | ICD-10-CM | POA: Diagnosis present

## 2019-07-01 DIAGNOSIS — D519 Vitamin B12 deficiency anemia, unspecified: Secondary | ICD-10-CM | POA: Diagnosis present

## 2019-07-01 DIAGNOSIS — E46 Unspecified protein-calorie malnutrition: Secondary | ICD-10-CM | POA: Diagnosis present

## 2019-07-01 DIAGNOSIS — E876 Hypokalemia: Secondary | ICD-10-CM | POA: Diagnosis present

## 2019-07-01 DIAGNOSIS — E871 Hypo-osmolality and hyponatremia: Secondary | ICD-10-CM

## 2019-07-01 DIAGNOSIS — Z91048 Other nonmedicinal substance allergy status: Secondary | ICD-10-CM

## 2019-07-01 DIAGNOSIS — Z91013 Allergy to seafood: Secondary | ICD-10-CM

## 2019-07-01 DIAGNOSIS — Z20822 Contact with and (suspected) exposure to covid-19: Secondary | ICD-10-CM | POA: Diagnosis present

## 2019-07-01 DIAGNOSIS — E559 Vitamin D deficiency, unspecified: Secondary | ICD-10-CM | POA: Diagnosis not present

## 2019-07-01 DIAGNOSIS — F419 Anxiety disorder, unspecified: Secondary | ICD-10-CM | POA: Diagnosis present

## 2019-07-01 DIAGNOSIS — E872 Acidosis: Secondary | ICD-10-CM | POA: Diagnosis present

## 2019-07-01 DIAGNOSIS — Z8619 Personal history of other infectious and parasitic diseases: Secondary | ICD-10-CM

## 2019-07-01 DIAGNOSIS — F329 Major depressive disorder, single episode, unspecified: Secondary | ICD-10-CM | POA: Diagnosis present

## 2019-07-01 DIAGNOSIS — Z6830 Body mass index (BMI) 30.0-30.9, adult: Secondary | ICD-10-CM

## 2019-07-01 DIAGNOSIS — E860 Dehydration: Secondary | ICD-10-CM

## 2019-07-01 DIAGNOSIS — E669 Obesity, unspecified: Secondary | ICD-10-CM | POA: Diagnosis present

## 2019-07-01 DIAGNOSIS — A419 Sepsis, unspecified organism: Secondary | ICD-10-CM | POA: Diagnosis present

## 2019-07-01 DIAGNOSIS — I959 Hypotension, unspecified: Secondary | ICD-10-CM | POA: Diagnosis present

## 2019-07-01 DIAGNOSIS — R110 Nausea: Secondary | ICD-10-CM

## 2019-07-01 DIAGNOSIS — Z79899 Other long term (current) drug therapy: Secondary | ICD-10-CM

## 2019-07-01 DIAGNOSIS — R197 Diarrhea, unspecified: Secondary | ICD-10-CM | POA: Diagnosis present

## 2019-07-01 DIAGNOSIS — F418 Other specified anxiety disorders: Secondary | ICD-10-CM | POA: Diagnosis present

## 2019-07-01 DIAGNOSIS — N179 Acute kidney failure, unspecified: Secondary | ICD-10-CM | POA: Diagnosis present

## 2019-07-01 DIAGNOSIS — E782 Mixed hyperlipidemia: Secondary | ICD-10-CM | POA: Diagnosis present

## 2019-07-01 DIAGNOSIS — R55 Syncope and collapse: Secondary | ICD-10-CM | POA: Diagnosis not present

## 2019-07-01 DIAGNOSIS — I10 Essential (primary) hypertension: Secondary | ICD-10-CM | POA: Diagnosis present

## 2019-07-01 DIAGNOSIS — K219 Gastro-esophageal reflux disease without esophagitis: Secondary | ICD-10-CM | POA: Diagnosis present

## 2019-07-01 LAB — BASIC METABOLIC PANEL
Anion gap: 14 (ref 5–15)
Anion gap: 14 (ref 5–15)
Anion gap: 9 (ref 5–15)
BUN: 16 mg/dL (ref 6–20)
BUN: 14 mg/dL (ref 6–20)
BUN: 12 mg/dL (ref 6–20)
CO2: 19 mmol/L — ABNORMAL LOW (ref 22–32)
CO2: 17 mmol/L — ABNORMAL LOW (ref 22–32)
CO2: 22 mmol/L (ref 22–32)
Calcium: 9.4 mg/dL (ref 8.9–10.3)
Calcium: 7.9 mg/dL — ABNORMAL LOW (ref 8.9–10.3)
Calcium: 8.5 mg/dL — ABNORMAL LOW (ref 8.9–10.3)
Chloride: 85 mmol/L — ABNORMAL LOW (ref 98–111)
Chloride: 86 mmol/L — ABNORMAL LOW (ref 98–111)
Chloride: 93 mmol/L — ABNORMAL LOW (ref 98–111)
Creatinine, Ser: 1.53 mg/dL — ABNORMAL HIGH (ref 0.44–1.00)
Creatinine, Ser: 1.54 mg/dL — ABNORMAL HIGH (ref 0.44–1.00)
Creatinine, Ser: 1.41 mg/dL — ABNORMAL HIGH (ref 0.44–1.00)
GFR calc Af Amer: 51 mL/min — ABNORMAL LOW (ref >60)
GFR calc Af Amer: 50 mL/min — ABNORMAL LOW (ref >60)
GFR calc Af Amer: 56 mL/min — ABNORMAL LOW (ref >60)
GFR calc non Af Amer: 44 mL/min — ABNORMAL LOW (ref >60)
GFR calc non Af Amer: 43 mL/min — ABNORMAL LOW (ref >60)
GFR calc non Af Amer: 48 mL/min — ABNORMAL LOW (ref >60)
Glucose, Bld: 99 mg/dL (ref 70–99)
Glucose, Bld: 167 mg/dL — ABNORMAL HIGH (ref 70–99)
Glucose, Bld: 134 mg/dL — ABNORMAL HIGH (ref 70–99)
Potassium: 2.1 mmol/L — CL (ref 3.5–5.1)
Potassium: 2.5 mmol/L — CL (ref 3.5–5.1)
Potassium: 2.6 mmol/L — CL (ref 3.5–5.1)
Sodium: 118 mmol/L — CL (ref 135–145)
Sodium: 117 mmol/L — CL (ref 135–145)
Sodium: 124 mmol/L — ABNORMAL LOW (ref 135–145)

## 2019-07-01 LAB — MAGNESIUM
Magnesium: 1.8 mg/dL (ref 1.7–2.4)
Magnesium: 2.1 mg/dL (ref 1.7–2.4)

## 2019-07-01 LAB — OSMOLALITY, URINE: Osmolality, Ur: 125 mOsm/kg — ABNORMAL LOW (ref 300–900)

## 2019-07-01 LAB — CBC WITH DIFFERENTIAL/PLATELET
Abs Immature Granulocytes: 0.06 10*3/uL (ref 0.00–0.07)
Basophils Absolute: 0.1 10*3/uL (ref 0.0–0.1)
Basophils Relative: 1 %
Eosinophils Absolute: 0.1 10*3/uL (ref 0.0–0.5)
Eosinophils Relative: 1 %
HCT: 35.3 % — ABNORMAL LOW (ref 36.0–46.0)
Hemoglobin: 13.2 g/dL (ref 12.0–15.0)
Immature Granulocytes: 1 %
Lymphocytes Relative: 27 %
Lymphs Abs: 3.2 10*3/uL (ref 0.7–4.0)
MCH: 29 pg (ref 26.0–34.0)
MCHC: 37.4 g/dL — ABNORMAL HIGH (ref 30.0–36.0)
MCV: 77.6 fL — ABNORMAL LOW (ref 80.0–100.0)
Monocytes Absolute: 1.2 10*3/uL — ABNORMAL HIGH (ref 0.1–1.0)
Monocytes Relative: 10 %
Neutro Abs: 7.6 10*3/uL (ref 1.7–7.7)
Neutrophils Relative %: 60 %
Platelets: 617 10*3/uL — ABNORMAL HIGH (ref 150–400)
RBC: 4.55 MIL/uL (ref 3.87–5.11)
RDW: 12.1 % (ref 11.5–15.5)
WBC: 12.2 10*3/uL — ABNORMAL HIGH (ref 4.0–10.5)
nRBC: 0 % (ref 0.0–0.2)

## 2019-07-01 LAB — COMPREHENSIVE METABOLIC PANEL
ALT: 19 U/L (ref 0–44)
AST: 22 U/L (ref 15–41)
Albumin: 3.9 g/dL (ref 3.5–5.0)
Alkaline Phosphatase: 56 U/L (ref 38–126)
Anion gap: 11 (ref 5–15)
BUN: 16 mg/dL (ref 6–20)
CO2: 18 mmol/L — ABNORMAL LOW (ref 22–32)
Calcium: 9 mg/dL (ref 8.9–10.3)
Chloride: 85 mmol/L — ABNORMAL LOW (ref 98–111)
Creatinine, Ser: 1.41 mg/dL — ABNORMAL HIGH (ref 0.44–1.00)
GFR calc Af Amer: 56 mL/min — ABNORMAL LOW (ref >60)
GFR calc non Af Amer: 48 mL/min — ABNORMAL LOW (ref >60)
Glucose, Bld: 104 mg/dL — ABNORMAL HIGH (ref 70–99)
Potassium: 2.2 mmol/L — CL (ref 3.5–5.1)
Sodium: 114 mmol/L — CL (ref 135–145)
Total Bilirubin: 0.4 mg/dL (ref 0.3–1.2)
Total Protein: 7.3 g/dL (ref 6.5–8.1)

## 2019-07-01 LAB — LACTIC ACID, PLASMA
Lactic Acid, Venous: 2.8 mmol/L (ref 0.5–1.9)
Lactic Acid, Venous: 3.4 mmol/L (ref 0.5–1.9)

## 2019-07-01 LAB — PHOSPHORUS
Phosphorus: 2.7 mg/dL (ref 2.5–4.6)
Phosphorus: 3 mg/dL (ref 2.5–4.6)

## 2019-07-01 LAB — TSH: TSH: 4.662 u[IU]/mL — ABNORMAL HIGH (ref 0.350–4.500)

## 2019-07-01 LAB — CREATININE, URINE, RANDOM: Creatinine, Urine: 27 mg/dL

## 2019-07-01 LAB — T4, FREE: Free T4: 0.89 ng/dL (ref 0.61–1.12)

## 2019-07-01 LAB — PROCALCITONIN: Procalcitonin: <0.1 ng/mL

## 2019-07-01 LAB — SODIUM, URINE, RANDOM: Sodium, Ur: 12 mmol/L

## 2019-07-01 LAB — PREGNANCY, URINE: Preg Test, Ur: NEGATIVE

## 2019-07-01 MED ORDER — MORPHINE SULFATE (PF) 2 MG/ML IV SOLN
2.0000 mg | INTRAVENOUS | Status: DC | PRN
  Filled 2019-07-01: qty 1

## 2019-07-01 MED ORDER — SODIUM CHLORIDE 0.9 % IV BOLUS
1000.0000 mL | Freq: Once | INTRAVENOUS | Status: AC
  Administered 2019-07-01: 1000 mL via INTRAVENOUS

## 2019-07-01 MED ORDER — HYDROXYZINE PAMOATE 25 MG PO CAPS
25.0000 mg | ORAL_CAPSULE | Freq: Four times a day (QID) | ORAL | Status: DC | PRN
  Filled 2019-07-01: qty 1

## 2019-07-01 MED ORDER — GABAPENTIN 300 MG PO CAPS: 300.0000 mg | ORAL_CAPSULE | Freq: Two times a day (BID) | ORAL | Status: DC | PRN

## 2019-07-01 MED ORDER — SODIUM CHLORIDE 0.9 % IV BOLUS
1000.0000 mL | Freq: Once | INTRAVENOUS | Status: AC
Start: 2019-07-01 — End: 2019-07-02
  Administered 2019-07-01: 1000 mL via INTRAVENOUS

## 2019-07-01 MED ORDER — SODIUM CHLORIDE 0.9 % IV SOLN: INTRAVENOUS | Status: DC

## 2019-07-01 MED ORDER — ONDANSETRON HCL 4 MG/2ML IJ SOLN
4.0000 mg | Freq: Once | INTRAMUSCULAR | Status: AC
  Administered 2019-07-01: 4 mg via INTRAVENOUS
  Filled 2019-07-01: qty 2

## 2019-07-01 MED ORDER — DULOXETINE HCL 30 MG PO CPEP
60.0000 mg | ORAL_CAPSULE | Freq: Every day | ORAL | Status: DC
  Administered 2019-07-02 – 2019-07-11 (×10): 60 mg via ORAL
  Filled 2019-07-01 (×10): qty 2

## 2019-07-01 MED ORDER — POTASSIUM CHLORIDE CRYS ER 20 MEQ PO TBCR
60.0000 meq | EXTENDED_RELEASE_TABLET | Freq: Once | ORAL | Status: AC
  Administered 2019-07-01: 60 meq via ORAL
  Filled 2019-07-01: qty 3

## 2019-07-01 MED ORDER — MAGNESIUM SULFATE 2 GM/50ML IV SOLN
2.0000 g | INTRAVENOUS | Status: AC
  Administered 2019-07-01: 2 g via INTRAVENOUS
  Filled 2019-07-01: qty 50

## 2019-07-01 MED ORDER — TRAMADOL HCL 50 MG PO TABS: 50.0000 mg | ORAL_TABLET | Freq: Four times a day (QID) | ORAL | Status: DC | PRN

## 2019-07-01 MED ORDER — ENOXAPARIN SODIUM 40 MG/0.4ML ~~LOC~~ SOLN
40.0000 mg | SUBCUTANEOUS | Status: DC
  Administered 2019-07-01 – 2019-07-10 (×10): 40 mg via SUBCUTANEOUS
  Filled 2019-07-01 (×10): qty 0.4

## 2019-07-01 MED ORDER — ATORVASTATIN CALCIUM 20 MG PO TABS
20.0000 mg | ORAL_TABLET | Freq: Every day | ORAL | Status: DC
  Administered 2019-07-01 – 2019-07-10 (×10): 20 mg via ORAL
  Filled 2019-07-01 (×10): qty 1

## 2019-07-01 MED ORDER — POTASSIUM CHLORIDE 10 MEQ/100ML IV SOLN
10.0000 meq | INTRAVENOUS | Status: AC
  Administered 2019-07-01: 10 meq via INTRAVENOUS
  Filled 2019-07-01 (×2): qty 100

## 2019-07-01 MED ORDER — PANTOPRAZOLE SODIUM 40 MG IV SOLR
40.0000 mg | Freq: Once | INTRAVENOUS | Status: AC
  Administered 2019-07-01: 40 mg via INTRAVENOUS
  Filled 2019-07-01: qty 40

## 2019-07-01 MED ORDER — POTASSIUM CHLORIDE CRYS ER 20 MEQ PO TBCR
40.0000 meq | EXTENDED_RELEASE_TABLET | ORAL | Status: DC
  Administered 2019-07-01: 40 meq via ORAL
  Filled 2019-07-01: qty 2

## 2019-07-01 MED ORDER — DICYCLOMINE HCL 20 MG PO TABS
20.0000 mg | ORAL_TABLET | Freq: Three times a day (TID) | ORAL | Status: DC
  Administered 2019-07-01 – 2019-07-11 (×37): 20 mg via ORAL
  Filled 2019-07-01 (×42): qty 1

## 2019-07-01 MED ORDER — POTASSIUM CHLORIDE 10 MEQ/100ML IV SOLN
10.0000 meq | INTRAVENOUS | Status: AC
  Administered 2019-07-01 (×2): 10 meq via INTRAVENOUS
  Filled 2019-07-01 (×2): qty 100

## 2019-07-01 MED ORDER — OCTREOTIDE ACETATE 100 MCG/ML IJ SOLN
100.0000 ug | Freq: Three times a day (TID) | INTRAMUSCULAR | Status: DC
  Administered 2019-07-01 – 2019-07-11 (×29): 100 ug via SUBCUTANEOUS
  Filled 2019-07-01 (×36): qty 1

## 2019-07-01 MED ORDER — ONDANSETRON HCL 4 MG/2ML IJ SOLN: 4.0000 mg | Freq: Three times a day (TID) | INTRAMUSCULAR | Status: DC | PRN

## 2019-07-01 MED ORDER — PANTOPRAZOLE SODIUM 40 MG PO TBEC: 40.0000 mg | DELAYED_RELEASE_TABLET | Freq: Two times a day (BID) | ORAL | Status: DC

## 2019-07-01 MED ORDER — ALPRAZOLAM 0.5 MG PO TABS
1.0000 mg | ORAL_TABLET | Freq: Three times a day (TID) | ORAL | Status: DC | PRN
  Administered 2019-07-06: 1 mg via ORAL
  Filled 2019-07-01: qty 2

## 2019-07-01 MED ORDER — POTASSIUM CHLORIDE 10 MEQ/100ML IV SOLN
10.0000 meq | INTRAVENOUS | Status: AC
  Administered 2019-07-01 – 2019-07-02 (×6): 10 meq via INTRAVENOUS
  Filled 2019-07-01 (×6): qty 100

## 2019-07-01 MED ORDER — ACETAMINOPHEN 325 MG PO TABS
650.0000 mg | ORAL_TABLET | Freq: Four times a day (QID) | ORAL | Status: DC | PRN
  Administered 2019-07-05 – 2019-07-10 (×3): 650 mg via ORAL
  Filled 2019-07-01 (×3): qty 2

## 2019-07-01 MED ORDER — AMITRIPTYLINE HCL 50 MG PO TABS
50.0000 mg | ORAL_TABLET | Freq: Every day | ORAL | Status: DC
  Administered 2019-07-01: 50 mg via ORAL
  Filled 2019-07-01 (×2): qty 1

## 2019-07-01 NOTE — ED Notes (Signed)
Dr. Joni Fears aware of sodium of 118, potassium of 2.1.

## 2019-07-01 NOTE — ED Triage Notes (Signed)
Pt to ED via POV for abdominal pain. Upon arrival to front desk pt had syncopal episode. Pt BP was 89/60 with pulse of 98. Pt appeared pale. Pt has hx/o chronic abd pain and diarrhea with low sodium and potassium. PT is currently A & O

## 2019-07-01 NOTE — ED Notes (Signed)
Patient ambulated with steady gait to room commode and had a loose BM and voided.

## 2019-07-01 NOTE — ED Provider Notes (Signed)
[facility] Emergency Department Provider Note  ____________________________________________  Time seen: Approximately 2:59 PM  I have reviewed the triage vital signs and the nursing notes.   HISTORY  Chief Complaint Abdominal Pain and Loss of Consciousness    HPI Molly Cisneros is a 34 y.o. female with a history of chronic diarrhea sent to the ED from her doctor's office due to hypotension and generalized weakness.  I received a call from her hematologist, Dr. Mike Gip, prior to the patient's arrival in the ED  who notes that the patient has had diarrhea since April 2020, extensive work-up by GI.  Found to have an elevated chromogranin a level with otherwise negative work-up so far including MRI of the abdomen.  Suspected neuroendocrine tumor, awaiting authorization for PET scan.  Patient reports that she has difficulty eating and drinking regularly, loss of appetite.  She gets dizzy when she stands up, and on arrival to the emergency department lobby she had syncope.  She denies chest pain or shortness of breath.  She does have left upper quadrant pain which is chronic.  No black or bloody stools.     Past Medical History:  Diagnosis Date  . Acute appendicitis with localized peritonitis 11/06/2018  . Anxiety   . C. difficile diarrhea 02/05/2019  . Clostridium difficile diarrhea   . High cholesterol   . Hypertension   . Hypokalemia 02/05/2019  . Loss of weight   . Severe sepsis ([place]) 02/05/2019  . Shingles    reoccuring shingles on face     Patient Active Problem List   Diagnosis Date Noted  . Folate deficiency 06/30/2019  . B12 deficiency 06/30/2019  . Orthostasis 06/21/2019  . Hypochloremia   . Hyponatremia 02/13/2019  . Hypokalemia 02/05/2019  . Chronic diarrhea of unknown origin   . Essential hypertension 11/03/2018  . Hyperlipidemia, mixed 11/03/2018  . Degeneration of C5-C6 intervertebral disc 08/31/2018  . Herniation of left side of L4-L5  intervertebral disc 06/05/2017  . Obesity (BMI 30-39.9) 06/05/2017  . Chronic left-sided low back pain without sciatica 07/23/2015     Past Surgical History:  Procedure Laterality Date  . COLONOSCOPY WITH PROPOFOL N/A 12/23/2018   Procedure: COLONOSCOPY WITH PROPOFOL;  Surgeon: Lin Landsman, MD;  Location: [facility] ENDOSCOPY;  Service: Gastroenterology;  Laterality: N/A;  . ESOPHAGOGASTRODUODENOSCOPY (EGD) WITH PROPOFOL N/A 12/23/2018   Procedure: ESOPHAGOGASTRODUODENOSCOPY (EGD) WITH PROPOFOL;  Surgeon: Lin Landsman, MD;  Location: [facility] ENDOSCOPY;  Service: Gastroenterology;  Laterality: N/A;  . GIVENS CAPSULE STUDY N/A 06/14/2019   Procedure: GIVENS CAPSULE STUDY;  Surgeon: Lin Landsman, MD;  Location: [facility] ENDOSCOPY;  Service: Gastroenterology;  Laterality: N/A;  . GIVENS CAPSULE STUDY N/A 06/16/2019   Procedure: GIVENS CAPSULE STUDY;  Surgeon: Lin Landsman, MD;  Location: [facility] ENDOSCOPY;  Service: Gastroenterology;  Laterality: N/A;  . LAPAROSCOPIC APPENDECTOMY N/A 11/06/2018   Procedure: APPENDECTOMY LAPAROSCOPIC;  Surgeon: Herbert Pun, MD;  Location: [facility] ORS;  Service: General;  Laterality: N/A;     Prior to Admission medications   Medication Sig Start Date End Date Taking? Authorizing Provider  ALPRAZolam Duanne Moron) 1 MG tablet Take 1 mg by mouth 3 (three) times daily as needed for anxiety.    Yes [provider]  amitriptyline (ELAVIL) 50 MG tablet Take 50 mg by mouth at bedtime.  01/03/19  Yes [provider]  amphetamine-dextroamphetamine (ADDERALL XR) 30 MG 24 hr capsule Take 30 mg by mouth daily.  09/27/18  Yes [provider]  amphetamine-dextroamphetamine (  ADDERALL) 20 MG tablet Take 20 mg by mouth 2 (two) times daily.   Yes [provider]  atorvastatin (LIPITOR) 20 MG tablet TAKE (1) TABLET BY MOUTH EVERY DAY Patient taking differently: Take 20 mg by mouth daily.  03/03/19  Yes Glean Hess, MD  dicyclomine  (BENTYL) 20 MG tablet Take 1 tablet (20 mg total) by mouth 3 (three) times daily before meals. Patient taking differently: Take 20 mg by mouth 4 (four) times daily -  before meals and at bedtime.  05/16/19  Yes Lucilla Lame, MD  DULoxetine (CYMBALTA) 60 MG capsule Take 60 mg by mouth daily.  07/18/15  Yes [provider]  gabapentin (NEURONTIN) 300 MG capsule TAKE (1) CAPSULE BY MOUTH TWICE DAILY Patient taking differently: Take 300 mg by mouth 2 (two) times daily as needed (pain).  05/16/19  Yes Glean Hess, MD  hydrOXYzine (VISTARIL) 25 MG capsule TAKE 1 TO 2 CAPSULES BY MOUTH EVERY 6 HOURS Patient taking differently: Take 25-50 mg by mouth 4 (four) times daily as needed for anxiety or itching.  05/16/19  Yes Glean Hess, MD  ondansetron (ZOFRAN-ODT) 8 MG disintegrating tablet TAKE 1 TABLET BY MOUTH EVERY 8 HOURS AS NEEDED FOR NAUSEA OR VOMITING. 06/28/19  Yes Glean Hess, MD  pantoprazole (PROTONIX) 40 MG tablet TAKE ONE TABLET BY MOUTH TWICE DAILY. 30MINUTES PRIOR TO MEALS Patient taking differently: Take 40 mg by mouth 2 (two) times daily before a meal.  05/04/19  Yes Glean Hess, MD  potassium chloride 20 MEQ/15ML (10%) SOLN Take 15 mLs (20 mEq total) by mouth 2 (two) times daily. 06/21/19  Yes Glean Hess, MD  promethazine (PHENERGAN) 25 MG tablet Take 1 tablet (25 mg total) by mouth at bedtime as needed for nausea or vomiting. 06/02/19  Yes Glean Hess, MD  traMADol (ULTRAM) 50 MG tablet Take 50 mg by mouth every 6 (six) hours as needed.   Yes [provider]  valACYclovir (VALTREX) 1000 MG tablet Take 1,000 mg by mouth 2 (two) times daily as needed.   Yes [provider]  octreotide (SANDOSTATIN) 100 MCG/ML SOLN injection Inject 1 mL (100 mcg total) into the skin every 8 (eight) hours as needed (diarrhea). 06/30/19   Lequita Asal, MD  amitriptyline (ELAVIL) 25 MG tablet TAKE (1) TABLET BY MOUTH DAILY AT BEDTIME 11/23/18   Vanga,  Tally Due, MD  dicyclomine (BENTYL) 10 MG capsule TAKE (1) CAPSULE BY MOUTH FOUR TIMES A DAY BEFORE MEALS AND AT BEDTIME. Patient taking differently: Take 10 mg by mouth 4 (four) times daily.  12/21/18   Lin Landsman, MD     Allergies Shellfish allergy and Aloe vera   Family History  Problem Relation Age of Onset  . Non-Hodgkin's lymphoma Father 39       Basil Cell  . Pancreatic cancer Maternal Grandmother 45  . Thyroid cancer Maternal Grandmother 83  . Throat cancer Paternal Grandfather 24    Social History Social History   Tobacco Use  . Smoking status: Never Smoker  . Smokeless tobacco: Never Used  Substance Use Topics  . Alcohol use: Yes    Alcohol/week: 2.0 standard drinks    Types: 2 Cans of beer per week  . Drug use: No    Review of Systems  Constitutional:   No fever or chills.  ENT:   No sore throat. No rhinorrhea. Cardiovascular:   No chest pain or syncope. Respiratory:   No dyspnea or  cough. Gastrointestinal:   Positive left upper quadrant abdominal pain.  Positive diarrhea.  No vomiting Musculoskeletal:   Negative for focal pain or swelling All other systems reviewed and are negative except as documented above in ROS and HPI.  ____________________________________________   PHYSICAL EXAM:  VITAL SIGNS: ED Triage Vitals  Enc Vitals Group     BP 07/01/19 1239 (!) 75/52     Pulse Rate 07/01/19 1239 98     Resp 07/01/19 1239 16     Temp 07/01/19 1239 97.9 F (36.6 C)     Temp Source 07/01/19 1239 Oral     SpO2 07/01/19 1239 96 %     Weight 07/01/19 1240 215 lb 6.2 oz (97.7 kg)     Height 07/01/19 1240 5\' 11"  (1.803 m)     Head Circumference --      Peak Flow --      Pain Score 07/01/19 1239 9     Pain Loc --      Pain Edu? --      Excl. in [place]? --     Vital signs reviewed, nursing assessments reviewed.   Constitutional:   Alert and oriented.  Ill-appearing Eyes:   Conjunctivae are normal. EOMI. PERRL. ENT      Head:    Normocephalic and atraumatic.      Nose:   Wearing a mask.      Mouth/Throat:   Wearing a mask.      Neck:   No meningismus. Full ROM. Hematological/Lymphatic/Immunilogical:   No cervical lymphadenopathy. Cardiovascular:   Tachycardia heart rate 100. Symmetric bilateral radial and DP pulses.  No murmurs. Cap refill less than 2 seconds. Respiratory:   Normal respiratory effort without tachypnea/retractions. Breath sounds are clear and equal bilaterally. No wheezes/rales/rhonchi. Gastrointestinal:   Soft with left upper quadrant tenderness. Non distended. There is no CVA tenderness.  No rebound, rigidity, or guarding. Musculoskeletal:   Normal range of motion in all extremities. No joint effusions.  No lower extremity tenderness.  No edema. Neurologic:   Normal speech and language.  Motor grossly intact. No acute focal neurologic deficits are appreciated.  Skin:    Skin is warm, dry and intact. No rash noted.  No petechiae, purpura, or bullae.  ____________________________________________    LABS (pertinent positives/negatives) (all labs ordered are listed, but only abnormal results are displayed) Labs Reviewed  BASIC METABOLIC PANEL - Abnormal; Notable for the following components:      Result Value   Sodium 118 (*)    Potassium 2.1 (*)    Chloride 85 (*)    CO2 19 (*)    Creatinine, Ser 1.53 (*)    GFR calc non Af Amer 44 (*)    GFR calc Af Amer 51 (*)    All other components within normal limits  CBC WITH DIFFERENTIAL/PLATELET - Abnormal; Notable for the following components:   WBC 12.2 (*)    HCT 35.3 (*)    MCV 77.6 (*)    MCHC 37.4 (*)    Platelets 617 (*)    Monocytes Absolute 1.2 (*)    All other components within normal limits  TSH - Abnormal; Notable for the following components:   TSH 4.662 (*)    All other components within normal limits  SARS CORONAVIRUS 2 (TAT 6-24 HRS)  T4, FREE  MAGNESIUM  PHOSPHORUS    ____________________________________________   EKG  Interpreted by me Sinus rhythm rate of 94, normal axis.  Normal QRS and ST segments.  No acute ischemic change.  There is prolonged QTC of 544 ms  ____________________________________________    RADIOLOGY  No results found.  ____________________________________________   PROCEDURES .Critical Care Performed by: Carrie Mew, MD Authorized by: Carrie Mew, MD   Critical care provider statement:    Critical care time (minutes):  35   Critical care time was exclusive of:  Separately billable procedures and treating other patients   Critical care was necessary to treat or prevent imminent or life-threatening deterioration of the following conditions:  Metabolic crisis and dehydration   Critical care was time spent personally by me on the following activities:  Development of treatment plan with patient or surrogate, discussions with consultants, evaluation of patient's response to treatment, examination of patient, obtaining history from patient or surrogate, ordering and performing treatments and interventions, ordering and review of laboratory studies, ordering and review of radiographic studies, pulse oximetry, re-evaluation of patient's condition and review of old charts    ____________________________________________    CLINICAL IMPRESSION / [place] / ED COURSE  Medications ordered in the ED: Medications  octreotide (SANDOSTATIN) injection 100 mcg (100 mcg Subcutaneous Given 07/01/19 1310)  potassium chloride 10 mEq in 100 mL IVPB (has no administration in time range)  potassium chloride SA (KLOR-CON) CR tablet 40 mEq (40 mEq Oral Given 07/01/19 1500)  sodium chloride 0.9 % bolus 1,000 mL (0 mLs Intravenous Stopped 07/01/19 1344)  sodium chloride 0.9 % bolus 1,000 mL (0 mLs Intravenous Stopped 07/01/19 1344)  magnesium sulfate IVPB 2 g 50 mL (0 g Intravenous Stopped 07/01/19 1410)    Pertinent  labs & imaging results that were available during my care of the patient were reviewed by me and considered in my medical decision making (see chart for details).  Molly Cisneros was evaluated in Emergency Department on 07/01/2019 for the symptoms described in the history of present illness. She was evaluated in the context of the global COVID-19 pandemic, which necessitated consideration that the patient might be at risk for infection with the SARS-CoV-2 virus that causes COVID-19. Institutional protocols and algorithms that pertain to the evaluation of patients at risk for COVID-19 are in a state of rapid change based on information released by regulatory bodies including the CDC and federal and state organizations. These policies and algorithms were followed during the patient's care in the ED.   Patient presents with dehydration hypotension due to chronic GI losses and poor oral intake.  I placed the patient in Trendelenburg position for improved neuro perfusion until she can be volume resuscitated.  I discussed with GI Dr. Allen Norris who agrees with Dr. Kem Parkinson suggestion to start octreotide 100 mcg subcu every 8 hours.  I am initiating resuscitation with 2 L IV saline bolus, check labs.  Magnesium 2 g bolus for the prolonged QTC which I suspect is due to electrolyte abnormalities.  ----------------------------------------- 3:03 PM on 07/01/2019 -----------------------------------------  Sodium of 118.  Potassium of 2.1.  Receiving oral and IV potassium.  Case has been discussed with the hospitalist for further management.      ____________________________________________   FINAL CLINICAL IMPRESSION(S) / ED DIAGNOSES    Final diagnoses:  Hyponatremia  Dehydration  Syncope, unspecified syncope type     ED Discharge Orders    None      Portions of this note were generated with dragon dictation software. Dictation errors may occur despite best attempts at proofreading.   Carrie Mew, MD 07/01/19 1504

## 2019-07-01 NOTE — Telephone Encounter (Signed)
Pt complaining of worsening diarrhea, feelings weak and possibly dehydrated. Called Dr Kem Parkinson office to see if they wanted to do a lab recheck on the patient or see her for an appt.   Dr Mike Gip called our office to speak to Dr Army Melia. Said she will call the hospital to see about a direct admit.  Did orthostatics on the patient while she is in the office:  Supine: 88/54      105 Pulse Sitting: 78/50       110 Pulse Standing: 70/50   117 Pulse

## 2019-07-01 NOTE — H&P (Addendum)
History and Physical    Molly Cisneros [id number] DOB: 03-07-1986 DOA: 07/01/2019  Referring MD/NP/PA:   PCP: Glean Hess, MD   Patient coming from:  The patient is coming from home.  At baseline, pt is independent for most of ADL.        Chief Complaint: Diarrhea  HPI: Molly Cisneros is a 34 y.o. female with medical history significant of hypertension, hyperlipidemia, GERD, depression, anxiety, C. difficile colitis, who presents with diarrhea.  Patient has been having chronic diarrhea since April of last year. She has been having more than 10 times of watery diarrhea each day.  She has nausea, no vomiting.  No fever or chills.  She also has some central abdominal tenderness.  Patient does not have chest pain, shortness breath, cough, symptoms of UTI or unilateral weakness.  She has generalized weakness, lightheadedness and dizziness on standing up.   Of note, patient has had extensive work-up by GI, and found to have an elevated chromogranin level with otherwise negative work-up so far including MRI of the abdomen. Per hematologist, Dr. Mike Gip,  Pt is suspected to have neuroendocrine tumor, awaiting authorization for PET scan. Pt will need octreotide for controlling diarrhea per Dr. Mike Gip. Pt was seen in clinic and found to have hypotension.  Patient was sent to ED for further evaluation and treatment.  ED Course: pt was found to have WBC 12.2, pending RVP PCR for Covid test, potassium 2.1, sodium 118, AKI with Cre 1.53 and BUN 16, magnesium 2.1, phosphorus of 2.7, temperature normal, low blood pressure 75/52, which improved to SBP 90-110s with IV fluid resuscitation, tachycardia, oxygen saturation 96% on room air.  Patient is admitted to [facility] bed as inpatient.  Review of Systems:   General: no fevers, chills, no body weight gain, has poor appetite, has fatigue HEENT: no blurry vision, hearing changes or sore throat Respiratory: no dyspnea, coughing, wheezing CV: no chest pain,  no palpitations GI: has nausea, abdominal pain, diarrhea, no constipation and vomiting. GU: no dysuria, burning on urination, increased urinary frequency, hematuria  Ext: no leg edema Neuro: no unilateral weakness, numbness, or tingling, no vision change or hearing loss. Has lightheadedness. Skin: no rash, no skin tear. MSK: No muscle spasm, no deformity, no limitation of range of movement in spin Heme: No easy bruising.  Travel history: No recent long distant travel.  Allergy:  Allergies  Allergen Reactions  . Shellfish Allergy Anaphylaxis  . Aloe Vera Dermatitis    Past Medical History:  Diagnosis Date  . Acute appendicitis with localized peritonitis 11/06/2018  . Anxiety   . C. difficile diarrhea 02/05/2019  . Clostridium difficile diarrhea   . High cholesterol   . Hypertension   . Hypokalemia 02/05/2019  . Loss of weight   . Severe sepsis ([place]) 02/05/2019  . Shingles    reoccuring shingles on face    Past Surgical History:  Procedure Laterality Date  . COLONOSCOPY WITH PROPOFOL N/A 12/23/2018   Procedure: COLONOSCOPY WITH PROPOFOL;  Surgeon: Lin Landsman, MD;  Location: [facility] ENDOSCOPY;  Service: Gastroenterology;  Laterality: N/A;  . ESOPHAGOGASTRODUODENOSCOPY (EGD) WITH PROPOFOL N/A 12/23/2018   Procedure: ESOPHAGOGASTRODUODENOSCOPY (EGD) WITH PROPOFOL;  Surgeon: Lin Landsman, MD;  Location[facility] END[facility]COPY;  Service: Gastroenterology;  Laterality: N/A;  . GIVENS CAPSULE STUDY N/A 06/14/2019   Procedure: GIVENS CAPSULE STUDY;  Surgeon: Lin Landsman, MD;  Location: [facility] ENDOSCOPY;  Service: Gastroenterology;  Laterality: N/A;  . GIVENS CAPSULE STUDY N/A 06/16/2019  Procedure: GIVENS CAPSULE STUDY;  Surgeon: Lin Landsman, MD;  Location: [facility] ENDOSCOPY;  Service: Gastroenterology;  Laterality: N/A;  . LAPAROSCOPIC APPENDECTOMY N/A 11/06/2018   Procedure: APPENDECTOMY LAPAROSCOPIC;  Surgeon: Herbert Pun, MD;  Location: [facility] ORS;  Service:  General;  Laterality: N/A;    Social History:  reports that she has never smoked. She has never used smokeless tobacco. She reports current alcohol use of about 2.0 standard drinks of alcohol per week. She reports that she does not use drugs.  Family History:  Family History  Problem Relation Age of Onset  . Non-Hodgkin's lymphoma Father 68       Basil Cell  . Pancreatic cancer Maternal Grandmother 31  . Thyroid cancer Maternal Grandmother 64  . Throat cancer Paternal Grandfather 85     Prior to Admission medications   Medication Sig Start Date End Date Taking? Authorizing Provider  ALPRAZolam Duanne Moron) 1 MG tablet Take 1 mg by mouth 3 (three) times daily as needed for anxiety.    Yes [provider]  amitriptyline (ELAVIL) 50 MG tablet Take 50 mg by mouth at bedtime.  01/03/19  Yes [provider]  amphetamine-dextroamphetamine (ADDERALL XR) 30 MG 24 hr capsule Take 30 mg by mouth daily.  09/27/18  Yes [provider]  amphetamine-dextroamphetamine (ADDERALL) 20 MG tablet Take 20 mg by mouth 2 (two) times daily.   Yes [provider]  atorvastatin (LIPITOR) 20 MG tablet TAKE (1) TABLET BY MOUTH EVERY DAY Patient taking differently: Take 20 mg by mouth daily.  03/03/19  Yes Glean Hess, MD  dicyclomine (BENTYL) 20 MG tablet Take 1 tablet (20 mg total) by mouth 3 (three) times daily before meals. Patient taking differently: Take 20 mg by mouth 4 (four) times daily -  before meals and at bedtime.  05/16/19  Yes Lucilla Lame, MD  DULoxetine (CYMBALTA) 60 MG capsule Take 60 mg by mouth daily.  07/18/15  Yes [provider]  gabapentin (NEURONTIN) 300 MG capsule TAKE (1) CAPSULE BY MOUTH TWICE DAILY Patient taking differently: Take 300 mg by mouth 2 (two) times daily as needed (pain).  05/16/19  Yes Glean Hess, MD  hydrOXYzine (VISTARIL) 25 MG capsule TAKE 1 TO 2 CAPSULES BY MOUTH EVERY 6 HOURS Patient taking differently: Take 25-50 mg by  mouth 4 (four) times daily as needed for anxiety or itching.  05/16/19  Yes Glean Hess, MD  ondansetron (ZOFRAN-ODT) 8 MG disintegrating tablet TAKE 1 TABLET BY MOUTH EVERY 8 HOURS AS NEEDED FOR NAUSEA OR VOMITING. 06/28/19  Yes Glean Hess, MD  pantoprazole (PROTONIX) 40 MG tablet TAKE ONE TABLET BY MOUTH TWICE DAILY. 30MINUTES PRIOR TO MEALS Patient taking differently: Take 40 mg by mouth 2 (two) times daily before a meal.  05/04/19  Yes Glean Hess, MD  potassium chloride 20 MEQ/15ML (10%) SOLN Take 15 mLs (20 mEq total) by mouth 2 (two) times daily. 06/21/19  Yes Glean Hess, MD  promethazine (PHENERGAN) 25 MG tablet Take 1 tablet (25 mg total) by mouth at bedtime as needed for nausea or vomiting. 06/02/19  Yes Glean Hess, MD  traMADol (ULTRAM) 50 MG tablet Take 50 mg by mouth every 6 (six) hours as needed.   Yes [provider]  valACYclovir (VALTREX) 1000 MG tablet Take 1,000 mg by mouth 2 (two) times daily as needed.   Yes [provider]  octreotide (SANDOSTATIN) 100 MCG/ML SOLN injection Inject 1 mL (100 mcg total) into the  skin every 8 (eight) hours as needed (diarrhea). 06/30/19   Lequita Asal, MD  amitriptyline (ELAVIL) 25 MG tablet TAKE (1) TABLET BY MOUTH DAILY AT BEDTIME 11/23/18   Vanga, Tally Due, MD  dicyclomine (BENTYL) 10 MG capsule TAKE (1) CAPSULE BY MOUTH FOUR TIMES A DAY BEFORE MEALS AND AT BEDTIME. Patient taking differently: Take 10 mg by mouth 4 (four) times daily.  12/21/18   Lin Landsman, MD    Physical Exam: Vitals:   07/01/19 1445 07/01/19 1500 07/01/19 1515 07/01/19 1848  BP: (!) 90/49 (!) 94/59 110/69 101/69  Pulse: 93 (!) 104 (!) 102 90  Resp: 16 (!) 22 19 15   Temp:    97.8 F (36.6 C)  TempSrc:    Oral  SpO2: 98% 100% 100% 100%  Weight:      Height:       General: Not in acute distress.  Dry mucus and membrane HEENT:       Eyes: PERRL, EOMI, no scleral icterus.       ENT: No discharge from the  ears and nose, no pharynx injection, no tonsillar enlargement.        Neck: No JVD, no bruit, no mass felt. Heme: No neck lymph node enlargement. Cardiac: S1/S2, RRR, No murmurs, No gallops or rubs. Respiratory: No rales, wheezing, rhonchi or rubs. GI: Soft, nondistended, has tenderness in central abdomen, no rebound pain, no organomegaly, BS present. GU: No hematuria Ext: No pitting leg edema bilaterally. 2+DP/PT pulse bilaterally. Musculoskeletal: No joint deformities, No joint redness or warmth, no limitation of ROM in spin. Skin: No rashes.  Neuro: Alert, oriented X3, cranial nerves II-XII grossly intact, moves all extremities normally.  Psych: Patient is not psychotic, no suicidal or hemocidal ideation.  Labs on Admission: I have personally reviewed following labs and imaging studies  CBC: Recent Labs  Lab 07/01/19 1245  WBC 12.2*  NEUTROABS 7.6  HGB 13.2  HCT 35.3*  MCV 77.6*  PLT 99991111*   Basic Metabolic Panel: Recent Labs  Lab 07/01/19 1117 07/01/19 1245 07/01/19 1640  NA 114* 118* 117*  K 2.2* 2.1* 2.5*  CL 85* 85* 86*  CO2 18* 19* 17*  GLUCOSE 104* 99 167*  BUN 16 16 14   CREATININE 1.41* 1.53* 1.54*  CALCIUM 9.0 9.4 7.9*  MG 1.8 2.1  --   PHOS 2.7 3.0  --    GFR: Estimated Creatinine Clearance: 65.7 mL/min (A) (by C-G formula based on SCr of 1.54 mg/dL (H)). Liver Function Tests: Recent Labs  Lab 07/01/19 1117  AST 22  ALT 19  ALKPHOS 56  BILITOT 0.4  PROT 7.3  ALBUMIN 3.9   No results for input(s): LIPASE, AMYLASE in the last 168 hours. No results for input(s): AMMONIA in the last 168 hours. Coagulation Profile: No results for input(s): INR, PROTIME in the last 168 hours. Cardiac Enzymes: No results for input(s): CKTOTAL, CKMB, CKMBINDEX, TROPONINI in the last 168 hours. BNP (last 3 results) No results for input(s): PROBNP in the last 8760 hours. HbA1C: No results for input(s): HGBA1C in the last 72 hours. CBG: No results for input(s):  GLUCAP in the last 168 hours. Lipid Profile: No results for input(s): CHOL, HDL, LDLCALC, TRIG, CHOLHDL, LDLDIRECT in the last 72 hours. Thyroid Function Tests: Recent Labs    07/01/19 1245  TSH 4.662*  FREET4 0.89   Anemia Panel: No results for input(s): VITAMINB12, FOLATE, FERRITIN, TIBC, IRON, RETICCTPCT in the last 72 hours. Urine analysis:  Component Value Date/Time   COLORURINE STRAW (A) 06/11/2019 0801   APPEARANCEUR CLEAR (A) 06/11/2019 0801   LABSPEC 1.001 (L) 06/11/2019 0801   PHURINE 7.0 06/11/2019 0801   GLUCOSEU NEGATIVE 06/11/2019 0801   HGBUR NEGATIVE 06/11/2019 0801   BILIRUBINUR NEGATIVE 06/11/2019 0801   KETONESUR NEGATIVE 06/11/2019 0801   PROTEINUR NEGATIVE 06/11/2019 0801   NITRITE NEGATIVE 06/11/2019 0801   LEUKOCYTESUR NEGATIVE 06/11/2019 0801   Sepsis Labs: @LABRCNTIP (procalcitonin:4,lacticidven:4) )No results found for this or any previous visit (from the past 240 hour(s)).   Radiological Exams on Admission: No results found.   EKG: Independently reviewed.  Sinus tachycardia, QTC 544, early R wave progression  Assessment/Plan Principal Problem:   Chronic diarrhea of unknown origin Active Problems:   Essential hypertension   Hyperlipidemia, mixed   Hypokalemia   Hyponatremia   Sepsis (HCC)   AKI (acute kidney injury) ([place])   GERD (gastroesophageal reflux disease)   Depression with anxiety   Chronic diarrhea of unknown origin: Etiology is not clear.  Currently suspecting neuroendocrine tumor. Will also need to r/o C diff colitis. I sent message to Dr. Allen Norris of GI and Dr. Mike Gip of oncology. Per Dr. Mike Gip, pt was seen by Dr.Vanga of GI. She will be on call this weekend. Pt has not been formally diagnosed with neuroendocrine tumor yet. She will ask the person on call in her group to check in on the the patient.   -will admit to med-surg bed as inpt -Octreotide 100 mg 3 times daily is started in ED -As needed morphine for pain, Zofran  for nausea -IVF: 3L Ns, then 125 cc/h -will check C diff pcr and GI path panel  Essential hypertension: -hold Bp med due to hypotension  Hyperlipidemia, mixed: -Lipitor  Hypokalemia: K=2.1 on admission. - Repleted - Check Mg level -->2.1 - 2 g of magnesium sulfate was given in ED  Hyponatremia: Most likely due to GI loss.  - Will check urine sodium, urine osmolality, serum osmolality. - check TSH - IVF: 3L NS in ED, will continue with IV normal saline at 125 mL/h - f/u by BMP q6h - avoid over correction too fast due to risk of central pontine myelinolysis  Sepsis vs. SIRS: Patient has leukocytosis with hypotension, tachycardia, meets criteria for sepsis.  -will f/u C diff pcr and GI path panel -will get Procalcitonin and trend lactic acid levels per sepsis protocol. -IVF: 3L of NS bolus in ED, followed by 125 cc/h   AKI (acute kidney injury) ([place]): Most likely due to dehydration.  ATN is also possible due to hypotension -follow up by BMP  GERD: -protonix  Depression and anxiety: Stable, no suicidal or homicidal ideations. -Continue home medications      DVT ppx: SQ Lovenox Code Status: Full code Family Communication: None at bed side.     Disposition Plan:  Anticipate discharge back to previous home environment Consults called:  GI, Dr. Allen Norris and oncology, Dr. Mike Gip Admission status: Med-surg bed as inpt     Date of Service 07/01/2019    [facility]   If 7PM-7AM, please contact night-coverage [email or web address] 07/01/2019, 6:53 PM

## 2019-07-01 NOTE — ED Notes (Signed)
First nurse note: pt presents from Dr. office for hypotension. Pt states she has been followed by Dr. Mike Gip who wanted pt to be direct admitted for diarrhea/dehydration but decided to send pt to ED for BP 70/50 standing. Pt had witnessed syncopal episode in lobby, caught by staff and lowered slowly to floor. Stretcher brought to lobby and pt taken to room 9. Pt arrived with mother Dr. Ronnald Ramp (phone [phone]).   Pt had labs done this morning: sodium 114, potassium 2.2

## 2019-07-02 DIAGNOSIS — R55 Syncope and collapse: Secondary | ICD-10-CM

## 2019-07-02 DIAGNOSIS — N179 Acute kidney failure, unspecified: Secondary | ICD-10-CM

## 2019-07-02 LAB — CBC
HCT: 30 % — ABNORMAL LOW (ref 36.0–46.0)
Hemoglobin: 11.1 g/dL — ABNORMAL LOW (ref 12.0–15.0)
MCH: 29.5 pg (ref 26.0–34.0)
MCHC: 37 g/dL — ABNORMAL HIGH (ref 30.0–36.0)
MCV: 79.8 fL — ABNORMAL LOW (ref 80.0–100.0)
Platelets: 491 10*3/uL — ABNORMAL HIGH (ref 150–400)
RBC: 3.76 MIL/uL — ABNORMAL LOW (ref 3.87–5.11)
RDW: 12.5 % (ref 11.5–15.5)
WBC: 8.7 10*3/uL (ref 4.0–10.5)
nRBC: 0 % (ref 0.0–0.2)

## 2019-07-02 LAB — BASIC METABOLIC PANEL
Anion gap: 9 (ref 5–15)
Anion gap: 9 (ref 5–15)
BUN: 11 mg/dL (ref 6–20)
BUN: 10 mg/dL (ref 6–20)
CO2: 19 mmol/L — ABNORMAL LOW (ref 22–32)
CO2: 21 mmol/L — ABNORMAL LOW (ref 22–32)
Calcium: 8 mg/dL — ABNORMAL LOW (ref 8.9–10.3)
Calcium: 8.5 mg/dL — ABNORMAL LOW (ref 8.9–10.3)
Chloride: 99 mmol/L (ref 98–111)
Chloride: 103 mmol/L (ref 98–111)
Creatinine, Ser: 1.34 mg/dL — ABNORMAL HIGH (ref 0.44–1.00)
Creatinine, Ser: 1.31 mg/dL — ABNORMAL HIGH (ref 0.44–1.00)
GFR calc Af Amer: 59 mL/min — ABNORMAL LOW (ref >60)
GFR calc Af Amer: >60 mL/min (ref >60)
GFR calc non Af Amer: 51 mL/min — ABNORMAL LOW (ref >60)
GFR calc non Af Amer: 53 mL/min — ABNORMAL LOW (ref >60)
Glucose, Bld: 133 mg/dL — ABNORMAL HIGH (ref 70–99)
Glucose, Bld: 106 mg/dL — ABNORMAL HIGH (ref 70–99)
Potassium: 2.9 mmol/L — ABNORMAL LOW (ref 3.5–5.1)
Potassium: 3 mmol/L — ABNORMAL LOW (ref 3.5–5.1)
Sodium: 127 mmol/L — ABNORMAL LOW (ref 135–145)
Sodium: 133 mmol/L — ABNORMAL LOW (ref 135–145)

## 2019-07-02 LAB — OSMOLALITY: Osmolality: 253 mOsm/kg — ABNORMAL LOW (ref 275–295)

## 2019-07-02 LAB — SARS CORONAVIRUS 2 (TAT 6-24 HRS): SARS Coronavirus 2: NEGATIVE

## 2019-07-02 MED ORDER — FOLIC ACID 1 MG PO TABS
1.0000 mg | ORAL_TABLET | Freq: Every day | ORAL | Status: DC
  Administered 2019-07-02 – 2019-07-03 (×2): 1 mg via ORAL
  Filled 2019-07-02 (×2): qty 1

## 2019-07-02 MED ORDER — PROMETHAZINE HCL 25 MG RE SUPP
25.0000 mg | Freq: Four times a day (QID) | RECTAL | Status: DC | PRN
  Filled 2019-07-02: qty 1

## 2019-07-02 MED ORDER — POTASSIUM CHLORIDE CRYS ER 20 MEQ PO TBCR
40.0000 meq | EXTENDED_RELEASE_TABLET | Freq: Once | ORAL | Status: AC
  Administered 2019-07-02: 40 meq via ORAL
  Filled 2019-07-02: qty 2

## 2019-07-02 MED ORDER — ONDANSETRON HCL 4 MG/2ML IJ SOLN
4.0000 mg | Freq: Four times a day (QID) | INTRAMUSCULAR | Status: DC | PRN
  Administered 2019-07-02 – 2019-07-10 (×8): 4 mg via INTRAVENOUS
  Filled 2019-07-02 (×8): qty 2

## 2019-07-02 MED ORDER — VITAMIN B-12 1000 MCG PO TABS
1000.0000 ug | ORAL_TABLET | Freq: Every day | ORAL | Status: DC
  Administered 2019-07-02 – 2019-07-11 (×10): 1000 ug via ORAL
  Filled 2019-07-02 (×11): qty 1

## 2019-07-02 MED ORDER — PROMETHAZINE HCL 25 MG/ML IJ SOLN: 12.5000 mg | Freq: Four times a day (QID) | INTRAMUSCULAR | Status: DC | PRN

## 2019-07-02 MED ORDER — POTASSIUM CHLORIDE IN NACL 20-0.9 MEQ/L-% IV SOLN
INTRAVENOUS | Status: DC
  Filled 2019-07-02 (×6): qty 1000

## 2019-07-02 MED ORDER — AMITRIPTYLINE HCL 75 MG PO TABS
75.0000 mg | ORAL_TABLET | Freq: Every day | ORAL | Status: DC
  Administered 2019-07-02 – 2019-07-10 (×9): 75 mg via ORAL
  Filled 2019-07-02 (×10): qty 1

## 2019-07-02 MED ORDER — PROMETHAZINE HCL 25 MG PO TABS: 25.0000 mg | ORAL_TABLET | Freq: Four times a day (QID) | ORAL | Status: DC | PRN

## 2019-07-02 MED ORDER — OXYCODONE HCL 5 MG PO TABS
5.0000 mg | ORAL_TABLET | Freq: Four times a day (QID) | ORAL | Status: DC | PRN
  Administered 2019-07-02: 5 mg via ORAL
  Filled 2019-07-02: qty 1

## 2019-07-02 NOTE — Consult Note (Signed)
[name], MD [address]  [place]  [place], [place] [address]  Main: [phone]  Fax: [phone] Pager: [phone]   Consultation  Referring Provider:     No ref. provider found Primary Care Physician:  Glean Hess, MD Primary Gastroenterologist:  Dr. Sherri Sear      Reason for Consultation:     Chronic diarrhea, nausea and abdominal pain  Date of Admission:  07/01/2019 Date of Consultation:  07/02/2019         HPI:   Molly Cisneros is a 34 y.o. female who is well-known to me as outpatient for management of chronic diarrhea, nausea and abdominal pain.  Molly Cisneros has history of depression, anxiety, history of back surgery, I started seeing her for severe postprandial diarrhea, abdominal pain, nausea and weight loss since 09/2018. Her course is complicated by attack of acute appendicitis status post laparoscopic appendectomy on 11/06/2018. Patient is recoveredwell from surgery.  She had extensive work-up due to ongoing diarrhea, electrolyte abnormalities, AKI and multiple hospitalizations which has been unremarkable thus far including CT abdomen and pelvis, celiac serologies, CBC, CMP, CRP, TSH, fecal calprotectin levels, bidirectional endoscopy, video capsule endoscopy, MR enterography.  She did not have renal insufficiency.  She had history of C. difficile which was treated in the past.  Stool studies revealed secretory type of diarrhea with osmolar gap less than 50.  Subsequently, underwent work-up for neuroendocrine tumors which came back normal for serum gastrin, VIP, somatostatin, calcitonin, pancreatic polypeptide, 5 HIAA.  Her chromogranin A levels were mildly elevated.  Patient's insurance did not approve dotatate scan to evaluate for neuroendocrine tumor.  She was temporarily on Viberzi 75 mg twice daily which she thinks has helped modestly.  She admitted again due to abdominal pain associated with nausea, when she went to see her PCP, she was found to be  hypotensive, with severe hypokalemia, hyponatremia and mild AKI.  She reports having up to 10 watery bowel movements daily.  Denies nocturnal diarrhea.  She continues to take Bentyl 20 mg before meals and amitriptyline 50 mg at bedtime.  Molly Cisneros has been going through significant stress in his personal life, taking care of her mom who has c talk with [facility] Kid[facility] tooth disease and dependent on her.  Molly Cisneros is her primary caregiver.  She does not work.  She used to do part-time job before pandemic.  Currently stays at home.  She does admit to significant stress in her personal life.   NSAIDs: None  Antiplts/Anticoagulants/Anti thrombotics: None  GI Procedures:  EGD and colonoscopy 12/23/2018 - Normal duodenal bulb and second portion of the duodenum. Biopsied. - A medium amount of food (residue) in the stomach. - Normal stomach. Biopsied. - Normal gastroesophageal junction and esophagus.  - Preparation of the colon was fair. - The examined portion of the ileum was normal. - Normal mucosa in the entire examined colon. Biopsied. - The distal rectum and anal verge are normal on retroflexion view. - Stool in the entire examined colon.  DIAGNOSIS:  A. DUODENUM; COLD BIOPSY:  - ENTERIC MUCOSA WITH PRESERVED VILLOUS ARCHITECTURE AND NO SIGNIFICANT  HISTOPATHOLOGIC CHANGE.  - NEGATIVE FOR FEATURES OF CELIAC, DYSPLASIA, AND MALIGNANCY.   B. STOMACH; COLD BIOPSY:  - GASTRIC ANTRAL AND OXYNTIC MUCOSA WITH NO SIGNIFICANT HISTOPATHOLOGIC  CHANGE.  - NEGATIVE FOR H. PYLORI, DYSPLASIA, AND MALIGNANCY.   C. COLON, RANDOM; COLD BIOPSY:  - BENIGN COLONIC MUCOSA WITH NO SIGNIFICANT HISTOPATHOLOGIC CHANGE.  - NEGATIVE FOR FEATURES  OF MICROSCOPIC COLITIS.  - NEGATIVE FOR DYSPLASIA AND MALIGNANCY.  Video capsule endoscopy 06/16/2019 Normal  She did not have any GI surgeries She denies family history of GI malignancy, inflammatory bowel disease   Past Medical History:  Diagnosis Date  . Acute appendicitis with  localized peritonitis 11/06/2018  . Anxiety   . C. difficile diarrhea 02/05/2019  . Clostridium difficile diarrhea   . High cholesterol   . Hypertension   . Hypokalemia 02/05/2019  . Loss of weight   . Severe sepsis ([place]) 02/05/2019  . Shingles    reoccuring shingles on face    Past Surgical History:  Procedure Laterality Date  . COLONOSCOPY WITH PROPOFOL N/A 12/23/2018   Procedure: COLONOSCOPY WITH PROPOFOL;  Surgeon: Lin Landsman, MD;  Location: [facility] ENDOSCOPY;  Service: Gastroenterology;  Laterality: N/A;  . ESOPHAGOGASTRODUODENOSCOPY (EGD) WITH PROPOFOL N/A 12/23/2018   Procedure: ESOPHAGOGASTRODUODENOSCOPY (EGD) WITH PROPOFOL;  Surgeon: Lin Landsman, MD;  Location[facility] ENDOSCOPY;  Service: Gastroenterology;  Laterality: N/A;  . GIVENS CAPSULE STUDY N/A 06/14/2019   Procedure: GIVENS CAPSULE STUDY;  Surgeon: Lin Landsman, MD;  Location: [facility] ENDOSCOPY;  Service: Gastroenterology;  Laterality: N/A;  . GIVENS CAPSULE STUDY N/A 06/16/2019   Procedure: GIVENS CAPSULE STUDY;  Surgeon: Lin Landsman, MD;  Location: [facility] ENDOSCOPY;  Service: Gastroenterology;  Laterality: N/A;  . LAPAROSCOPIC APPENDECTOMY N/A 11/06/2018   Procedure: APPENDECTOMY LAPAROSCOPIC;  Surgeon: Herbert Pun, MD;  Location: [facility] ORS;  Service: General;  Laterality: N/A;    Prior to Admission medications   Medication Sig Start Date End Date Taking? Authorizing Provider  ALPRAZolam Duanne Moron) 1 MG tablet Take 1 mg by mouth 3 (three) times daily as needed for anxiety.    Yes [provider]  amitriptyline (ELAVIL) 50 MG tablet Take 50 mg by mouth at bedtime.  01/03/19  Yes [provider]  amphetamine-dextroamphetamine (ADDERALL XR) 30 MG 24 hr capsule Take 30 mg by mouth daily.  09/27/18  Yes [provider]  amphetamine-dextroamphetamine (ADDERALL) 20 MG tablet Take 20 mg by mouth 2 (two) times daily.   Yes [provider]  atorvastatin (LIPITOR) 20 MG  tablet TAKE (1) TABLET BY MOUTH EVERY DAY Patient taking differently: Take 20 mg by mouth daily.  03/03/19  Yes Glean Hess, MD  dicyclomine (BENTYL) 20 MG tablet Take 1 tablet (20 mg total) by mouth 3 (three) times daily before meals. Patient taking differently: Take 20 mg by mouth 4 (four) times daily -  before meals and at bedtime.  05/16/19  Yes Lucilla Lame, MD  DULoxetine (CYMBALTA) 60 MG capsule Take 60 mg by mouth daily.  07/18/15  Yes [provider]  gabapentin (NEURONTIN) 300 MG capsule TAKE (1) CAPSULE BY MOUTH TWICE DAILY Patient taking differently: Take 300 mg by mouth 2 (two) times daily as needed (pain).  05/16/19  Yes Glean Hess, MD  hydrOXYzine (VISTARIL) 25 MG capsule TAKE 1 TO 2 CAPSULES BY MOUTH EVERY 6 HOURS Patient taking differently: Take 25-50 mg by mouth 4 (four) times daily as needed for anxiety or itching.  05/16/19  Yes Glean Hess, MD  ondansetron (ZOFRAN-ODT) 8 MG disintegrating tablet TAKE 1 TABLET BY MOUTH EVERY 8 HOURS AS NEEDED FOR NAUSEA OR VOMITING. 06/28/19  Yes Glean Hess, MD  pantoprazole (PROTONIX) 40 MG tablet TAKE ONE TABLET BY MOUTH TWICE DAILY. 30MINUTES PRIOR TO MEALS Patient taking differently: Take 40 mg by mouth 2 (two) times daily before a meal.  05/04/19  Yes Glean Hess, MD  potassium chloride 20 MEQ/15ML (10%) SOLN Take 15 mLs (20 mEq total) by mouth 2 (two) times daily. 06/21/19  Yes Glean Hess, MD  promethazine (PHENERGAN) 25 MG tablet Take 1 tablet (25 mg total) by mouth at bedtime as needed for nausea or vomiting. 06/02/19  Yes Glean Hess, MD  traMADol (ULTRAM) 50 MG tablet Take 50 mg by mouth every 6 (six) hours as needed.   Yes [provider]  valACYclovir (VALTREX) 1000 MG tablet Take 1,000 mg by mouth 2 (two) times daily as needed.   Yes [provider]  octreotide (SANDOSTATIN) 100 MCG/ML SOLN injection Inject 1 mL (100 mcg total) into the skin every 8 (eight) hours as  needed (diarrhea). 06/30/19   Lequita Asal, MD  amitriptyline (ELAVIL) 25 MG tablet TAKE (1) TABLET BY MOUTH DAILY AT BEDTIME 11/23/18   Damareon Lanni, Tally Due, MD  dicyclomine (BENTYL) 10 MG capsule TAKE (1) CAPSULE BY MOUTH FOUR TIMES A DAY BEFORE MEALS AND AT BEDTIME. Patient taking differently: Take 10 mg by mouth 4 (four) times daily.  12/21/18   Lin Landsman, MD    Current Facility-Administered Medications:  .  0.9 % NaCl with KCl 20 mEq/ L  infusion, , Intravenous, Continuous, Danford, Suann Larry, MD, Last Rate: 125 mL/hr at 07/02/19 0830, New Bag at 07/02/19 0830 .  acetaminophen (TYLENOL) tablet 650 mg, 650 mg, Oral, Q6H PRN, Ivor Costa, MD .  ALPRAZolam Duanne Moron) tablet 1 mg, 1 mg, Oral, TID PRN, Ivor Costa, MD .  amitriptyline (ELAVIL) tablet 75 mg, 75 mg, Oral, QHS, Pau Banh, Tally Due, MD .  atorvastatin (LIPITOR) tablet 20 mg, 20 mg, Oral, Daily, Ivor Costa, MD, 20 mg at 07/01/19 2233 .  dicyclomine (BENTYL) tablet 20 mg, 20 mg, Oral, TID AC & HS, Ivor Costa, MD, 20 mg at 07/02/19 1244 .  DULoxetine (CYMBALTA) DR capsule 60 mg, 60 mg, Oral, Daily, Ivor Costa, MD, 60 mg at 07/02/19 1055 .  enoxaparin (LOVENOX) injection 40 mg, 40 mg, Subcutaneous, Q24H, Ivor Costa, MD, 40 mg at 07/01/19 2233 .  folic acid (FOLVITE) tablet 1 mg, 1 mg, Oral, Daily, Jakeira Seeman, Tally Due, MD, 1 mg at 07/02/19 1447 .  gabapentin (NEURONTIN) capsule 300 mg, 300 mg, Oral, BID PRN, Ivor Costa, MD .  hydrOXYzine (VISTARIL) capsule 25 mg, 25 mg, Oral, QID PRN, Ivor Costa, MD .  octreotide (SANDOSTATIN) injection 100 mcg, 100 mcg, Subcutaneous, Q8H, Ivor Costa, MD, 100 mcg at 07/02/19 1447 .  ondansetron (ZOFRAN) injection 4 mg, 4 mg, Intravenous, Q6H PRN, Danford, Suann Larry, MD, 4 mg at 07/02/19 1252 .  oxyCODONE (Oxy IR/ROXICODONE) immediate release tablet 5 mg, 5 mg, Oral, Q6H PRN, Danford, Suann Larry, MD, 5 mg at 07/02/19 1251 .  promethazine (PHENERGAN) tablet 25 mg, 25 mg, Oral, Q6H PRN  **OR** promethazine (PHENERGAN) injection 12.5 mg, 12.5 mg, Intravenous, Q6H PRN **OR** promethazine (PHENERGAN) suppository 25 mg, 25 mg, Rectal, Q6H PRN, Danford, Suann Larry, MD .  traMADol (ULTRAM) tablet 50 mg, 50 mg, Oral, Q6H PRN, Ivor Costa, MD .  vitamin B-12 (CYANOCOBALAMIN) tablet 1,000 mcg, 1,000 mcg, Oral, Daily, Madex Seals, Tally Due, MD, 1,000 mcg at 07/02/19 1447   Family History  Problem Relation Age of Onset  . Non-Hodgkin's lymphoma Father 68       Basil Cell  . Pancreatic cancer Maternal Grandmother 5  . Thyroid cancer Maternal Grandmother 34  . Throat cancer Paternal Grandfather 87  Social History   Tobacco Use  . Smoking status: Never Smoker  . Smokeless tobacco: Never Used  Substance Use Topics  . Alcohol use: Yes    Alcohol/week: 2.0 standard drinks    Types: 2 Cans of beer per week  . Drug use: No    Allergies as of 07/01/2019 - Review Complete 07/01/2019  Allergen Reaction Noted  . Shellfish allergy Anaphylaxis 05/30/2015  . Aloe vera Dermatitis 11/06/2018    Review of Systems:    All systems reviewed and negative except where noted in HPI.   Physical Exam:  Vital signs in last 24 hours: Temp:  [97.8 F (36.6 C)-98.9 F (37.2 C)] 98.2 F (36.8 C) (02/13 0425) Pulse Rate:  [90-100] 100 (02/13 0425) Resp:  [15-20] 20 (02/13 0425) BP: (101-120)/(65-74) 120/74 (02/13 0425) SpO2:  [99 %-100 %] 100 % (02/13 0425) Last BM Date: 07/01/19 General:   Pleasant, cooperative in NAD Head:  Normocephalic and atraumatic. Eyes:   No icterus.   Conjunctiva pink. PERRLA. Ears:  Normal auditory acuity. Neck:  Supple; no masses or thyroidomegaly Lungs: Respirations even and unlabored. Lungs clear to auscultation bilaterally.   No wheezes, crackles, or rhonchi.  Heart:  Regular rate and rhythm;  Without murmur, clicks, rubs or gallops Abdomen:  Soft, nondistended, nontender. Normal bowel sounds. No appreciable masses or hepatomegaly.  No rebound or  guarding.  Rectal:  Not performed. Msk:  Symmetrical without gross deformities.  Strength generalized weakness Extremities:  Without edema, cyanosis or clubbing. Neurologic:  Alert and oriented x3;  grossly normal neurologically. Skin:  Intact without significant lesions or rashes. Cervical Nodes:  No significant cervical adenopathy. Psych:  Alert and cooperative. Normal affect.  LAB RESULTS: CBC Latest Ref Rng & Units 07/02/2019 07/01/2019 06/21/2019  WBC 4.0 - 10.5 K/uL 8.7 12.2(H) 13.3(H)  Hemoglobin 12.0 - 15.0 g/dL 11.1(L) 13.2 13.3  Hematocrit 36.0 - 46.0 % 30.0(L) 35.3(L) 37.3  Platelets 150 - 400 K/uL 491(H) 617(H) 505(H)    BMET BMP Latest Ref Rng & Units 07/02/2019 07/02/2019 07/01/2019  Glucose 70 - 99 mg/dL 106(H) 133(H) 134(H)  BUN 6 - 20 mg/dL _0 Creatinine 0.44 - 1.00 mg/dL 1.31(H) 1.34(H) 1.41(H)  BUN/Creat Ratio 9 - 23 - - -  Sodium 135 - 145 mmol/L 133(L) 127(L) 124(L)  Potassium 3.5 - 5.1 mmol/L 3.0(L) 2.9(L) 2.6(LL)  Chloride 98 - 111 mmol/L 103 99 93(L)  CO2 22 - 32 mmol/L 21(L) 19(L) 22  Calcium 8.9 - 10.3 mg/dL 8.5(L) 8.0(L) 8.5(L)    LFT Hepatic Function Latest Ref Rng & Units 07/01/2019 06/16/2019 06/12/2019  Total Protein 6.5 - 8.1 g/dL 7.3 - 6.3(L)  Albumin 3.5 - 5.0 g/dL 3.9 2.9(L) 3.2(L)  AST 15 - 41 U/L 22 - 21  ALT 0 - 44 U/L 19 - 23  Alk Phosphatase 38 - 126 U/L 56 - 46  Total Bilirubin 0.3 - 1.2 mg/dL 0.4 - 0.5  Bilirubin, Direct 0.0 - 0.2 mg/dL - - -     STUDIES: No results found.    Impression / Plan:   Molly Cisneros is a 34 y.o. female with history of anxiety, depression, s/p laparoscopic appendectomy for acute appendicitis, history of C. difficile diarrhea s/p treatment, chronic diarrhea with abdominal pain and nausea, weight loss.  Extensive work-up has been unrevealing so far as summarized in HPI. Admitted with another flareup of abdominal pain, nausea, ongoing diarrhea with hypotension, hypokalemia and hyponatremia.  She also  developed malnutrition with folate and  B12 deficiency anemia  Trial of short-acting subcutaneous octreotide 100 MCG 3 times a day, to be continued as outpatient Increase amitriptyline to 75 mg at bedtime Continue Bentyl 20 mg before each meal and at bedtime Recommend to start daily B12 and folate supplements Will add Viberzi 100 mg as outpatient As patient is going through significant stress in her personal life which probably is also contributing to her ongoing GI symptoms, I have advised her to discuss with her psychiatrist regarding coping mechanism, cognitive behavioral therapy.  Also discussed about stress management strategies such as meditation, yoga and art of living I will refer her to [facility] for second opinion after discharge  Hyponatremia, hypokalemia and AKI secondary to GI volume loss Currently being repleted and improving Good urine output Encourage p.o. hydration   Thank you for involving me in the care of this patient.  GI will follow along with you    LOS: 1 day   Sherri Sear, MD  07/02/2019, 3:39 PM   Note: This dictation was prepared with Dragon dictation along with smaller phrase technology. Any transcriptional errors that result from this process are unintentional.

## 2019-07-02 NOTE — Progress Notes (Addendum)
PROGRESS NOTE    Molly Cisneros  [id number] DOB: [birth year] DOA: 07/01/2019 PCP: Glean Hess, MD      Brief Narrative:  Ms Ghio is a 34 y.o. F with chronic diarrhea, HTN, hx Cdiff and mood disorder who presented to her oncologist's office with few days worsening of her chronic abdominal pain and diarrhea and was sent to the ER.  In the ER, BP 89/60, patient pale and syncopized at the triage desk.  WBC 12K, K 2.1.  Na 118, Cr 1.53 (from baseline 0.9).        Assessment & Plan:  Acute on chronic diarrhea causing severe symptomatic hypokalemia, hyponatremia, and acute kidney injury -Continue octreotide -Consult GI, appreciate expert advice -Continue dicyclomine -Repeat orthostatics    Hypokalemia Got 8 runs K and 60 mEq oral K yesteday, K up to 2.9 from 2.1. -Continue potassium supplementation -Add K to IV fluids  Hyponatremia Ur Na low, inappropriately high free water clearance (due to aggressive IV fluids?) but improving with fluids -Continue IV fluids  Acute kidney injury Non-gap metabolic acidosis Improving.  FeNA low confirming obvious clinical hypovolemia. -Continue IVF  Hypertension Chart history, not on meds  Other medications -Continue amitriptyline -Continue atorvastatin -Continue duloxetine -Stop pantoprazole for now  Anemia This is likely all dilutional.  Mood disorder -Hold Adderall         Disposition: The patient was admitted with severe electrolyte derangements due to severe diarrhea.  The patient continues to have hypokalemia, hyponatremia 124, and elevated creatinine from baseline despite 24 hours IV fluids, and she has nausea limiting oral intake making it unsafe to discharge home.  WIll require ongoing IV fluids, treamtent of nausea.         MDM: The below labs and imaging reports were reviewed and summarized above.  Medication management as above.  DVT prophylaxis: Lovenox Code Status: Code Family  Communication: Parent at bedside    Consultants:   GI  Procedures:     Antimicrobials:      Culture data:   2/12 GI pathogen panel           Subjective: The patient has nausea, some left-sided abdominal cramping, 3 bowel movements so far today.  These are watery.  Nonbloody.  No fever, confusion.  Still weak and fatigued, has not been able to stand up yet.  Objective: Vitals:   07/01/19 1515 07/01/19 1848 07/01/19 2039 07/02/19 0425  BP: 110/69 101/69 101/65 120/74  Pulse: (!) 102 90 94 100  Resp: 19 15 16 20   Temp:  97.8 F (36.6 C) 98.9 F (37.2 C) 98.2 F (36.8 C)  TempSrc:  Oral Oral Oral  SpO2: 100% 100% 99% 100%  Weight:      Height:        Intake/Output Summary (Last 24 hours) at 07/02/2019 1333 Last data filed at 07/02/2019 [id number] Gross per 24 hour  Intake 4898.97 ml  Output 1000 ml  Net 3898.97 ml   Filed Weights   07/01/19 1240  Weight: 97.7 kg    Examination: General appearance:  adult female, alert and in no acute distress.  Lying in bed HEENT: Anicteric, conjunctiva pink, lids and lashes normal. No nasal deformity, discharge, epistaxis.  Lips dry, oropharynx tacky dry,hearing normal.   Skin: Warm and dry.  No jaundice.    Cardiac: Slight tachycardia, regular rhythm, nl S1-S2, no murmurs appreciated.  Capillary refill is brisk. No LE edema.  Radial pulses 2+ and symmetric. Respiratory: Normal respiratory rate and rhythm.  CTAB without rales or wheezes. Abdomen: Abdomen soft.  Mild nonfocal TTP guarding, rebound, or rigidity. No ascites, distension, hepatosplenomegaly.   MSK: No deformities or effusions.  Normal muscle bulk and tone. Neuro: Awake and alert.  EOMI, moves all extremities. Speech fluent.    Psych: Sensorium intact and responding to questions, attention normal. Affect normal.  Judgment and insight appear normal.    Data Reviewed: I have personally reviewed following labs and imaging studies:  CBC: Recent Labs  Lab  2019-07-28 1245 07/02/19 0353  WBC 12.2* 8.7  NEUTROABS 7.6  --   HGB 13.2 11.1*  HCT 35.3* 30.0*  MCV 77.6* 79.8*  PLT 617* [id number]*   Basic Metabolic Panel: Recent Labs  Lab 07/28/2019 1117 07/28/19 1117 07/28/19 1245 07-28-19 1640 2019/07/28 2206 07/02/19 0353 07/02/19 1119  NA 114*   < > 118* 117* 124* 127* 133*  K 2.2*   < > 2.1* 2.5* 2.6* 2.9* 3.0*  CL 85*   < > 85* 86* 93* 99 103  CO2 18*   < > 19* 17* 22 19* 21*  GLUCOSE 104*   < > 99 167* 134* 133* 106*  BUN 16   < > 16 14 12 11 10   CREATININE 1.41*   < > 1.53* 1.54* 1.41* 1.34* 1.31*  CALCIUM 9.0   < > 9.4 7.9* 8.5* 8.0* 8.5*  MG 1.8  --  2.1  --   --   --   --   PHOS 2.7  --  3.0  --   --   --   --    < > = values in this interval not displayed.   GFR: Estimated Creatinine Clearance: 77.2 mL/min (A) (by C-G formula based on SCr of 1.31 mg/dL (H)). Liver Function Tests: Recent Labs  Lab 07/28/2019 1117  AST 22  ALT 19  ALKPHOS 56  BILITOT 0.4  PROT 7.3  ALBUMIN 3.9   No results for input(s): LIPASE, AMYLASE in the last 168 hours. No results for input(s): AMMONIA in the last 168 hours. Coagulation Profile: No results for input(s): INR, PROTIME in the last 168 hours. Cardiac Enzymes: No results for input(s): CKTOTAL, CKMB, CKMBINDEX, TROPONINI in the last 168 hours. BNP (last 3 results) No results for input(s): PROBNP in the last 8760 hours. HbA1C: No results for input(s): HGBA1C in the last 72 hours. CBG: No results for input(s): GLUCAP in the last 168 hours. Lipid Profile: No results for input(s): CHOL, HDL, LDLCALC, TRIG, CHOLHDL, LDLDIRECT in the last 72 hours. Thyroid Function Tests: Recent Labs    Jul 28, 2019 1245  TSH 4.662*  FREET4 0.89   Anemia Panel: No results for input(s): VITAMINB12, FOLATE, FERRITIN, TIBC, IRON, RETICCTPCT in the last 72 hours. Urine analysis:    Component Value Date/Time   COLORURINE STRAW (A) 06/11/2019 0801   APPEARANCEUR CLEAR (A) 06/11/2019 0801   LABSPEC 1.001 (L)  06/11/2019 0801   PHURINE 7.0 06/11/2019 0801   GLUCOSEU NEGATIVE 06/11/2019 0801   HGBUR NEGATIVE 06/11/2019 0801   BILIRUBINUR NEGATIVE 06/11/2019 0801   KETONESUR NEGATIVE 06/11/2019 0801   PROTEINUR NEGATIVE 06/11/2019 0801   NITRITE NEGATIVE 06/11/2019 0801   LEUKOCYTESUR NEGATIVE 06/11/2019 0801   Sepsis Labs: @LABRCNTIP (procalcitonin:4,lacticacidven:4)  ) Recent Results (from the past 240 hour(s))  SARS CORONAVIRUS 2 (TAT 6-24 HRS) Nasopharyngeal Nasopharyngeal Swab     Status: None   Collection Time: 07/28/2019  3:05 PM   Specimen: Nasopharyngeal Swab  Result Value Ref Range Status   SARS Coronavirus 2  NEGATIVE NEGATIVE Final    Comment: (NOTE) SARS-CoV-2 target nucleic acids are NOT DETECTED. The SARS-CoV-2 RNA is generally detectable in upper and lower respiratory specimens during the acute phase of infection. Negative results do not preclude SARS-CoV-2 infection, do not rule out co-infections with other pathogens, and should not be used as the sole basis for treatment or other patient management decisions. Negative results must be combined with clinical observations, patient history, and epidemiological information. The expected result is Negative. Fact Sheet for Patients: SugarRoll.be Fact Sheet for Healthcare Providers: https://www.woods-mathews.com/ This test is not yet approved or cleared by the [place] FDA and  has been authorized for detection and/or diagnosis of SARS-CoV-2 by FDA under an Emergency Use Authorization (EUA). This EUA will remain  in effect (meaning this test can be used) for the duration of the COVID-19 declaration under Section 56 4(b)(1) of the Act, 21 U.S.C. section 360bbb-3(b)(1), unless the authorization is terminated or revoked sooner. Performed at [facility], [place] [address] [place], [place] [address]   CULTURE, BLOOD (ROUTINE X 2) w Reflex to ID Panel     Status: None (Preliminary  result)   Collection Time: 07/01/19  6:42 PM   Specimen: BLOOD  Result Value Ref Range Status   Specimen Description BLOOD RIGHT ANTECUBITAL  Final   Special Requests   Final    BOTTLES DRAWN AEROBIC AND ANAEROBIC Blood Culture adequate volume   Culture   Final    NO GROWTH < 12 HOURS Performed at [facility], [address] [place], [place] [address]    Report Status PENDING  Incomplete  CULTURE, BLOOD (ROUTINE X 2) w Reflex to ID Panel     Status: None (Preliminary result)   Collection Time: 07/01/19  6:54 PM   Specimen: BLOOD  Result Value Ref Range Status   Specimen Description BLOOD BLOOD RIGHT HAND  Final   Special Requests   Final    BOTTLES DRAWN AEROBIC AND ANAEROBIC Blood Culture adequate volume   Culture   Final    NO GROWTH < 12 HOURS Performed at [facility], [address], [place], [place] [address]    Report Status PENDING  Incomplete         Radiology Studies: No results found.      Scheduled Meds: . amitriptyline  50 mg Oral QHS  . atorvastatin  20 mg Oral Daily  . dicyclomine  20 mg Oral TID AC & HS  . DULoxetine  60 mg Oral Daily  . enoxaparin (LOVENOX) injection  40 mg Subcutaneous Q24H  . folic acid  1 mg Oral Daily  . octreotide  100 mcg Subcutaneous Q8H  . vitamin B-12  1,000 mcg Oral Daily   Continuous Infusions: . 0.9 % NaCl with KCl 20 mEq / L 125 mL/hr at 07/02/19 0830     LOS: 1 day    Time spent: 25 minutes    Edwin Dada, MD [facility] 07/02/2019, 1:33 PM     Please page though [place] or Epic secure chat:  For [employer], Adult nurse

## 2019-07-03 LAB — CBC
HCT: 30 % — ABNORMAL LOW (ref 36.0–46.0)
Hemoglobin: 10.8 g/dL — ABNORMAL LOW (ref 12.0–15.0)
MCH: 29.3 pg (ref 26.0–34.0)
MCHC: 36 g/dL (ref 30.0–36.0)
MCV: 81.3 fL (ref 80.0–100.0)
Platelets: 499 10*3/uL — ABNORMAL HIGH (ref 150–400)
RBC: 3.69 MIL/uL — ABNORMAL LOW (ref 3.87–5.11)
RDW: 13.1 % (ref 11.5–15.5)
WBC: 8.4 10*3/uL (ref 4.0–10.5)
nRBC: 0 % (ref 0.0–0.2)

## 2019-07-03 LAB — BASIC METABOLIC PANEL
Anion gap: 8 (ref 5–15)
BUN: 12 mg/dL (ref 6–20)
CO2: 19 mmol/L — ABNORMAL LOW (ref 22–32)
Calcium: 8.3 mg/dL — ABNORMAL LOW (ref 8.9–10.3)
Chloride: 108 mmol/L (ref 98–111)
Creatinine, Ser: 1.4 mg/dL — ABNORMAL HIGH (ref 0.44–1.00)
GFR calc Af Amer: 56 mL/min — ABNORMAL LOW (ref >60)
GFR calc non Af Amer: 49 mL/min — ABNORMAL LOW (ref >60)
Glucose, Bld: 115 mg/dL — ABNORMAL HIGH (ref 70–99)
Potassium: 2.8 mmol/L — ABNORMAL LOW (ref 3.5–5.1)
Sodium: 135 mmol/L (ref 135–145)

## 2019-07-03 LAB — OSMOLALITY: Osmolality: 283 mOsm/kg (ref 275–295)

## 2019-07-03 LAB — OSMOLALITY, URINE: Osmolality, Ur: 288 mOsm/kg — ABNORMAL LOW (ref 300–900)

## 2019-07-03 MED ORDER — SODIUM CHLORIDE 0.9% FLUSH: 10.0000 mL | INTRAVENOUS | Status: DC | PRN

## 2019-07-03 MED ORDER — POTASSIUM CHLORIDE CRYS ER 20 MEQ PO TBCR
40.0000 meq | EXTENDED_RELEASE_TABLET | Freq: Two times a day (BID) | ORAL | Status: DC
  Administered 2019-07-03 (×2): 40 meq via ORAL
  Filled 2019-07-03 (×3): qty 2

## 2019-07-03 MED ORDER — SODIUM CHLORIDE 0.9% FLUSH
10.0000 mL | Freq: Two times a day (BID) | INTRAVENOUS | Status: DC
  Administered 2019-07-05 – 2019-07-10 (×7): 10 mL

## 2019-07-03 MED ORDER — SODIUM CHLORIDE 0.9 % IV SOLN
INTRAVENOUS | Status: DC | PRN
  Administered 2019-07-03 (×2): 30 mL via INTRAVENOUS
  Administered 2019-07-05: 08:00:00 500 mL via INTRAVENOUS

## 2019-07-03 MED ORDER — FOLIC ACID 1 MG PO TABS
2.0000 mg | ORAL_TABLET | Freq: Once | ORAL | Status: AC
  Administered 2019-07-04: 2 mg via ORAL
  Filled 2019-07-03: qty 2

## 2019-07-03 MED ORDER — POTASSIUM CHLORIDE 10 MEQ/100ML IV SOLN
10.0000 meq | INTRAVENOUS | Status: AC
  Administered 2019-07-03 (×3): 10 meq via INTRAVENOUS
  Filled 2019-07-03 (×3): qty 100

## 2019-07-03 MED ORDER — SODIUM CHLORIDE 0.9 % IV BOLUS
500.0000 mL | Freq: Once | INTRAVENOUS | Status: AC
  Administered 2019-07-03: 500 mL via INTRAVENOUS

## 2019-07-03 MED ORDER — POTASSIUM CHLORIDE 2 MEQ/ML IV SOLN
INTRAVENOUS | Status: DC
  Filled 2019-07-03 (×8): qty 1000

## 2019-07-03 NOTE — Progress Notes (Signed)
Molly Darby, MD [address]  [place]  [place], [place] [address]  Main: [phone]  Fax: [phone] Pager: [phone]   Subjective: Patient reports feeling well after a long time.  She thinks octreotide is helping.  She had 3 bowel movements with some sediment in it.  She has tolerated solid food today.  She denies abdominal pain, nausea or vomiting.  Potassium is low, creatinine is slightly up   Objective: Vital signs in last 24 hours: Vitals:   07/02/19 1601 07/02/19 2029 07/03/19 0509 07/03/19 1129  BP: 97/65 93/67 102/70 115/78  Pulse: 94 86 86 89  Resp: 16 16 18    Temp: 98.3 F (36.8 C) 97.8 F (36.6 C) 97.9 F (36.6 C) 97.8 F (36.6 C)  TempSrc: Oral Oral Oral   SpO2: 100% 100% 98% 99%  Weight:      Height:       Weight change:   Intake/Output Summary (Last 24 hours) at 07/03/2019 1726 Last data filed at 07/03/2019 0534 Gross per 24 hour  Intake -  Output 0 ml  Net 0 ml     Exam: Heart:: Regular rate and rhythm, S1S2 present or without murmur or extra heart sounds Lungs: normal and clear to auscultation Abdomen: soft, nontender, normal bowel sounds   Lab Results: CBC Latest Ref Rng & Units 07/03/2019 07/02/2019 07/01/2019  WBC 4.0 - 10.5 K/uL 8.4 8.7 12.2(H)  Hemoglobin 12.0 - 15.0 g/dL 10.8(L) 11.1(L) 13.2  Hematocrit 36.0 - 46.0 % 30.0(L) 30.0(L) 35.3(L)  Platelets 150 - 400 K/uL 499(H) 491(H) 617(H)   CMP Latest Ref Rng & Units 07/03/2019 07/02/2019 07/02/2019  Glucose 70 - 99 mg/dL 115(H) 106(H) 133(H)  BUN 6 - 20 mg/dL 12 10 11   Creatinine 0.44 - 1.00 mg/dL 1.40(H) 1.31(H) 1.34(H)  Sodium 135 - 145 mmol/L 135 133(L) 127(L)  Potassium 3.5 - 5.1 mmol/L 2.8(L) 3.0(L) 2.9(L)  Chloride 98 - 111 mmol/L 108 103 99  CO2 22 - 32 mmol/L 19(L) 21(L) 19(L)  Calcium 8.9 - 10.3 mg/dL 8.3(L) 8.5(L) 8.0(L)  Total Protein 6.5 - 8.1 g/dL - - -  Total Bilirubin 0.3 - 1.2 mg/dL - - -  Alkaline Phos 38 - 126 U/L - - -  AST 15 - 41 U/L - - -  ALT  0 - 44 U/L - - -    Micro Results: Recent Results (from the past 240 hour(s))  SARS CORONAVIRUS 2 (TAT 6-24 HRS) Nasopharyngeal Nasopharyngeal Swab     Status: None   Collection Time: 07/01/19  3:05 PM   Specimen: Nasopharyngeal Swab  Result Value Ref Range Status   SARS Coronavirus 2 NEGATIVE NEGATIVE Final    Comment: (NOTE) SARS-CoV-2 target nucleic acids are NOT DETECTED. The SARS-CoV-2 RNA is generally detectable in upper and lower respiratory specimens during the acute phase of infection. Negative results do not preclude SARS-CoV-2 infection, do not rule out co-infections with other pathogens, and should not be used as the sole basis for treatment or other patient management decisions. Negative results must be combined with clinical observations, patient history, and epidemiological information. The expected result is Negative. Fact Sheet for Patients: SugarRoll.be Fact Sheet for Healthcare Providers: https://www.woods-mathews.com/ This test is not yet approved or cleared by the [place] FDA and  has been authorized for detection and/or diagnosis of SARS-CoV-2 by FDA under an Emergency Use Authorization (EUA). This EUA will remain  in effect (meaning this test can be used) for the duration of the COVID-19 declaration  under Section 56 4(b)(1) of the Act, 21 U.S.C. section 360bbb-3(b)(1), unless the authorization is terminated or revoked sooner. Performed at [facility], [place] [address], [place], [place] [address]   CULTURE, BLOOD (ROUTINE X 2) w Reflex to ID Panel     Status: None (Preliminary result)   Collection Time: 07/01/19  6:42 PM   Specimen: BLOOD  Result Value Ref Range Status   Specimen Description BLOOD RIGHT ANTECUBITAL  Final   Special Requests   Final    BOTTLES DRAWN AEROBIC AND ANAEROBIC Blood Culture adequate volume   Culture   Final    NO GROWTH 2 DAYS Performed at [facility], [address], [place], [place] [address]    Report Status PENDING  Incomplete  CULTURE, BLOOD (ROUTINE X 2) w Reflex to ID Panel     Status: None (Preliminary result)   Collection Time: 07/01/19  6:54 PM   Specimen: BLOOD  Result Value Ref Range Status   Specimen Description BLOOD BLOOD RIGHT HAND  Final   Special Requests   Final    BOTTLES DRAWN AEROBIC AND ANAEROBIC Blood Culture adequate volume   Culture   Final    NO GROWTH 2 DAYS Performed at [facility], [address] [place], [place] [address]    Report Status PENDING  Incomplete   Studies/Results: No results found. Medications:  I have reviewed the patient's current medications. Prior to Admission:  Medications Prior to Admission  Medication Sig Dispense Refill Last Dose  . ALPRAZolam (XANAX) 1 MG tablet Take 1 mg by mouth 3 (three) times daily as needed for anxiety.    prn at prn  . amitriptyline (ELAVIL) 50 MG tablet Take 50 mg by mouth at bedtime.    06/30/2019 at Unknown time  . amphetamine-dextroamphetamine (ADDERALL XR) 30 MG 24 hr capsule Take 30 mg by mouth daily.    Past Month at Unknown time  . amphetamine-dextroamphetamine (ADDERALL) 20 MG tablet Take 20 mg by mouth 2 (two) times daily.   Past Month at Unknown time  . atorvastatin (LIPITOR) 20 MG tablet TAKE (1) TABLET BY MOUTH EVERY DAY (Patient taking differently: Take 20 mg by mouth daily. ) 90 tablet 1 06/30/2019 at Unknown time  . dicyclomine (BENTYL) 20 MG tablet Take 1 tablet (20 mg total) by mouth 3 (three) times daily before meals. (Patient taking differently: Take 20 mg by mouth 4 (four) times daily -  before meals and at bedtime. ) 90 tablet 3 06/30/2019 at Unknown time  . DULoxetine (CYMBALTA) 60 MG capsule Take 60 mg by mouth daily.    06/30/2019 at Unknown time  . gabapentin (NEURONTIN) 300 MG capsule TAKE (1) CAPSULE BY MOUTH TWICE DAILY (Patient taking differently: Take 300 mg by mouth 2 (two) times daily as needed (pain). ) 60 capsule 2 prn at prn  .  hydrOXYzine (VISTARIL) 25 MG capsule TAKE 1 TO 2 CAPSULES BY MOUTH EVERY 6 HOURS (Patient taking differently: Take 25-50 mg by mouth 4 (four) times daily as needed for anxiety or itching. ) 60 capsule 2 prn at prn  . ondansetron (ZOFRAN-ODT) 8 MG disintegrating tablet TAKE 1 TABLET BY MOUTH EVERY 8 HOURS AS NEEDED FOR NAUSEA OR VOMITING. 20 tablet 0 prn at prn  . pantoprazole (PROTONIX) 40 MG tablet TAKE ONE TABLET BY MOUTH TWICE DAILY. 30MINUTES PRIOR TO MEALS (Patient taking differently: Take 40 mg by mouth 2 (two) times daily before a meal. ) 60 tablet 12 06/30/2019 at  Unknown time  . potassium chloride 20 MEQ/15ML (10%) SOLN Take 15 mLs (20 mEq total) by mouth 2 (two) times daily. 473 mL 0 06/30/2019 at Unknown time  . promethazine (PHENERGAN) 25 MG tablet Take 1 tablet (25 mg total) by mouth at bedtime as needed for nausea or vomiting. 30 tablet 1 prn at prn  . traMADol (ULTRAM) 50 MG tablet Take 50 mg by mouth every 6 (six) hours as needed.   prn at prn  . valACYclovir (VALTREX) 1000 MG tablet Take 1,000 mg by mouth 2 (two) times daily as needed.   prn at prn  . octreotide (SANDOSTATIN) 100 MCG/ML SOLN injection Inject 1 mL (100 mcg total) into the skin every 8 (eight) hours as needed (diarrhea). 30 mL 0 unknown at unknown   Scheduled: . amitriptyline  75 mg Oral QHS  . atorvastatin  20 mg Oral Daily  . dicyclomine  20 mg Oral TID AC & HS  . DULoxetine  60 mg Oral Daily  . enoxaparin (LOVENOX) injection  40 mg Subcutaneous Q24H  . [START ON 99991111 folic acid  2 mg Oral Once  . octreotide  100 mcg Subcutaneous Q8H  . potassium chloride  40 mEq Oral BID  . sodium chloride flush  10-40 mL Intracatheter Q12H  . vitamin B-12  1,000 mcg Oral Daily   Continuous: . lactated ringers with kcl 125 mL/hr at 07/03/19 1030  . potassium chloride     [id number], ALPRAZolam, gabapentin, hydrOXYzine, ondansetron (ZOFRAN) IV, oxyCODONE, promethazine **OR** promethazine **OR** promethazine,  sodium chloride flush, traMADol Anti-infectives (From admission, onward)   None     Scheduled Meds: . amitriptyline  75 mg Oral QHS  . atorvastatin  20 mg Oral Daily  . dicyclomine  20 mg Oral TID AC & HS  . DULoxetine  60 mg Oral Daily  . enoxaparin (LOVENOX) injection  40 mg Subcutaneous Q24H  . [START ON 99991111 folic acid  2 mg Oral Once  . octreotide  100 mcg Subcutaneous Q8H  . potassium chloride  40 mEq Oral BID  . sodium chloride flush  10-40 mL Intracatheter Q12H  . vitamin B-12  1,000 mcg Oral Daily   Continuous Infusions: . lactated ringers with kcl 125 mL/hr at 07/03/19 1030  . potassium chloride     PRN Meds:.acetaminophen, ALPRAZolam, gabapentin, hydrOXYzine, ondansetron (ZOFRAN) IV, oxyCODONE, promethazine **OR** promethazine **OR** promethazine, sodium chloride flush, traMADol   Assessment: Principal Problem:   Chronic diarrhea of unknown origin Active Problems:   Essential hypertension   Hyperlipidemia, mixed   Hypokalemia   Hyponatremia   Sepsis ([place])   AKI (acute kidney injury) ([place])   GERD (gastroesophageal reflux disease)   Depression with anxiety  Plan: Chronic diarrhea, abdominal pain and nausea: Symptoms resolved Continue subcutaneous octreotide 100 MCG 3 times a day, continue upon discharge Continue amitriptyline 75 mg at bedtime Continue Bentyl 20 mg before meals and at bedtime, might be able to stop as outpatient We will try to obtain dotatate scan as out patient since patient is responding to octreotide treatment to evaluate for neuroendocrine tumor  Hypokalemia, AKI Urine osmolality less than serum osmolality Nephrology is on board Switched maintenance IV fluids to LR  Anemia, thrombocytosis Has folate and B12 deficiency Continue oral folate and B12 daily, upon discharge  GI will sign off at this time, please call us back with questions or concerns Follow-up with me after discharge   LOS: 2 days     07/03/2019, 5:26  PM

## 2019-07-03 NOTE — Progress Notes (Signed)
PROGRESS NOTE    Molly Cisneros  [id number] DOB: Feb 10, 1986 DOA: 07/01/2019 PCP: Glean Hess, MD      Brief Narrative:  Ms Sallee is a 34 y.o. F with chronic diarrhea, HTN, hx Cdiff and mood disorder who presented to her oncologist's office with few days worsening of her chronic abdominal pain and diarrhea and was sent to the ER.  In the ER, BP 89/60, patient pale and syncopized at the triage desk.  WBC 12K, K 2.1.  Na 118, Cr 1.53 (from baseline 0.9).        Assessment & Plan:  Severe chronic diarrhea causing severe symptomatic hypokalemia, hyponatremia, and acute kidney injury Patient admitted and started on octreotide, IV fluids, home dicyclomine.    Stools slowed in last 24 hours with octreotide Repeat orthostatics today improved -Continue octreotide -Consult GI, appreciate expert advice -Continue dicyclomine     Hypokalemia K 2.1 > 2.9 > 3.1 > 2.8 today  -Add back oral K -Continue K in fluids   Hyponatremia Improved with IV fluids.  -Consult Nephrology -Continue IV fluids, change to LR  Acute kidney injury Non-gap metabolic acidosis Improved slightly initially, now stable. FeNA suggesting hypovolemic injury. -Continue IV fluids -Consult Nephrology  Hypertension Chart history, not on meds  Other medications -Continue amitriptyline -Continue atorvastatin -Continue duloxetine -Hold pantoprazole for now  Anemia This is likely all dilutional.  Mood disorder -Hold Adderall         Disposition: The patient was admitted with AKI, severe electrolyte derangements, syncope due to severe diarrhea.  Despite IV fluids, the patient still has hypokalemia 2.8, and poor oral intake.  At present she does not take enough by mouth for [place] to safely discharge her home.  We will continue IV fluids, treatment of nausea and diarrhea, and hopefully she will be stabilized for discharge within 24 to 48 hours.          MDM: The below labs and imaging  reports reviewed and summarized above.  Medication management as above.      DVT prophylaxis: Lovenox Code Status: FULL Family Communication:     Consultants:   GI  Nephrology  Procedures:     Antimicrobials:      Culture data:   2/12 GI pathogen panel pending  2/12 blood cultures x2 NG          Subjective: Patient still has nausea, abdominal cramping, several soft stools.  No fever, confusion, vomiting.         Objective: Vitals:   07/02/19 1601 07/02/19 2029 07/03/19 0509 07/03/19 1129  BP: 97/65 93/67 102/70 115/78  Pulse: 94 86 86 89  Resp: 16 16 18    Temp: 98.3 F (36.8 C) 97.8 F (36.6 C) 97.9 F (36.6 C) 97.8 F (36.6 C)  TempSrc: Oral Oral Oral   SpO2: 100% 100% 98% 99%  Weight:      Height:        Intake/Output Summary (Last 24 hours) at 07/03/2019 1329 Last data filed at 07/03/2019 0534 Gross per 24 hour  Intake 980.5 ml  Output 0 ml  Net 980.5 ml   Filed Weights   07/01/19 1240  Weight: 97.7 kg    Examination: General appearance:  adult female, alert and in no acute distress.   HEENT: Anicteric, conjunctiva pink, lids and lashes normal. No nasal deformity, discharge, epistaxis.  Lips dry, teeth normal.  Oropharynx tacky dry, no oral lesions.   Skin: Warm and dry.  No suspicious rashes or lesions. Cardiac:  RRR, no murmurs appreciated.  No LE edema.    Respiratory: Normal respiratory rate and rhythm.  CTAB without rales or wheezes. Abdomen: Abdomen soft.  Mild nonfocal TTP, no guarding or rigidity. No ascites, distension, hepatosplenomegaly.   MSK: No deformities or effusions of the large joints of the upper or lower extremities bilaterally. Neuro: Awake and alert. Naming is grossly intact, and the patient's recall, recent and remote, as well as general fund of knowledge seem within normal limits.  Muscle tone normal, without fasciculations.  Moves all extremities equally and with normal coordination.  Speech fluent.     Psych: Sensorium intact and responding to questions, attention normal. Affect normal.  Judgment and insight appear normal.      Data Reviewed: I have personally reviewed following labs and imaging studies:  CBC: Recent Labs  Lab 07/01/19 1245 07/02/19 0353 07/03/19 0511  WBC 12.2* 8.7 8.4  NEUTROABS 7.6  --   --   HGB 13.2 11.1* 10.8*  HCT 35.3* 30.0* 30.0*  MCV 77.6* 79.8* 81.3  PLT 617* 491* 99991111*   Basic Metabolic Panel: Recent Labs  Lab 07/01/19 1117 07/01/19 1117 07/01/19 1245 07/01/19 1245 07/01/19 1640 07/01/19 2206 07/02/19 0353 07/02/19 1119 07/03/19 0511  NA 114*   < > 118*   < > 117* 124* 127* 133* 135  K 2.2*   < > 2.1*   < > 2.5* 2.6* 2.9* 3.0* 2.8*  CL 85*   < > 85*   < > 86* 93* 99 103 108  CO2 18*   < > 19*   < > 17* 22 19* 21* 19*  GLUCOSE 104*   < > 99   < > 167* 134* 133* 106* 115*  BUN 16   < > 16   < > 14 12 11 10 12   CREATININE 1.41*   < > 1.53*   < > 1.54* 1.41* 1.34* 1.31* 1.40*  CALCIUM 9.0   < > 9.4   < > 7.9* 8.5* 8.0* 8.5* 8.3*  MG 1.8  --  2.1  --   --   --   --   --   --   PHOS 2.7  --  3.0  --   --   --   --   --   --    < > = values in this interval not displayed.   GFR: Estimated Creatinine Clearance: 72.3 mL/min (A) (by C-G formula based on SCr of 1.4 mg/dL (H)). Liver Function Tests: Recent Labs  Lab 07/01/19 1117  AST 22  ALT 19  ALKPHOS 56  BILITOT 0.4  PROT 7.3  ALBUMIN 3.9   No results for input(s): LIPASE, AMYLASE in the last 168 hours. No results for input(s): AMMONIA in the last 168 hours. Coagulation Profile: No results for input(s): INR, PROTIME in the last 168 hours. Cardiac Enzymes: No results for input(s): CKTOTAL, CKMB, CKMBINDEX, TROPONINI in the last 168 hours. BNP (last 3 results) No results for input(s): PROBNP in the last 8760 hours. HbA1C: No results for input(s): HGBA1C in the last 72 hours. CBG: No results for input(s): GLUCAP in the last 168 hours. Lipid Profile: No results for input(s):  CHOL, HDL, LDLCALC, TRIG, CHOLHDL, LDLDIRECT in the last 72 hours. Thyroid Function Tests: Recent Labs    07/01/19 1245  TSH 4.662*  FREET4 0.89   Anemia Panel: No results for input(s): VITAMINB12, FOLATE, FERRITIN, TIBC, IRON, RETICCTPCT in the last 72 hours. Urine analysis:    Component Value  Date/Time   COLORURINE STRAW (A) 06/11/2019 0801   APPEARANCEUR CLEAR (A) 06/11/2019 0801   LABSPEC 1.001 (L) 06/11/2019 0801   PHURINE 7.0 06/11/2019 0801   GLUCOSEU NEGATIVE 06/11/2019 0801   HGBUR NEGATIVE 06/11/2019 0801   BILIRUBINUR NEGATIVE 06/11/2019 0801   KETONESUR NEGATIVE 06/11/2019 0801   PROTEINUR NEGATIVE 06/11/2019 0801   NITRITE NEGATIVE 06/11/2019 0801   LEUKOCYTESUR NEGATIVE 06/11/2019 0801   Sepsis Labs: @LABRCNTIP (procalcitonin:4,lacticacidven:4)  ) Recent Results (from the past 240 hour(s))  SARS CORONAVIRUS 2 (TAT 6-24 HRS) Nasopharyngeal Nasopharyngeal Swab     Status: None   Collection Time: 07/01/19  3:05 PM   Specimen: Nasopharyngeal Swab  Result Value Ref Range Status   SARS Coronavirus 2 NEGATIVE NEGATIVE Final    Comment: (NOTE) SARS-CoV-2 target nucleic acids are NOT DETECTED. The SARS-CoV-2 RNA is generally detectable in upper and lower respiratory specimens during the acute phase of infection. Negative results do not preclude SARS-CoV-2 infection, do not rule out co-infections with other pathogens, and should not be used as the sole basis for treatment or other patient management decisions. Negative results must be combined with clinical observations, patient history, and epidemiological information. The expected result is Negative. Fact Sheet for Patients: SugarRoll.be Fact Sheet for Healthcare Providers: https://www.woods-mathews.com/ This test is not yet approved or cleared by the [place] FDA and  has been authorized for detection and/or diagnosis of SARS-CoV-2 by FDA under an Emergency Use  Authorization (EUA). This EUA will remain  in effect (meaning this test can be used) for the duration of the COVID-19 declaration under Section 56 4(b)(1) of the Act, 21 U.S.C. section 360bbb-3(b)(1), unless the authorization is terminated or revoked sooner. Performed at [facility], [place] [address] [place], [place] [address]   CULTURE, BLOOD (ROUTINE X 2) w Reflex to ID Panel     Status: None (Preliminary result)   Collection Time: 07/01/19  6:42 PM   Specimen: BLOOD  Result Value Ref Range Status   Specimen Description BLOOD RIGHT ANTECUBITAL  Final   Special Requests   Final    BOTTLES DRAWN AEROBIC AND ANAEROBIC Blood Culture adequate volume   Culture   Final    NO GROWTH 2 DAYS Performed at [facility], [address], [place], [place] [address]    Report Status PENDING  Incomplete  CULTURE, BLOOD (ROUTINE X 2) w Reflex to ID Panel     Status: None (Preliminary result)   Collection Time: 07/01/19  6:54 PM   Specimen: BLOOD  Result Value Ref Range Status   Specimen Description BLOOD BLOOD RIGHT HAND  Final   Special Requests   Final    BOTTLES DRAWN AEROBIC AND ANAEROBIC Blood Culture adequate volume   Culture   Final    NO GROWTH 2 DAYS Performed at [facility], [address], [place],  [address]    Report Status PENDING  Incomplete         Radiology Studies: No results found.      Scheduled Meds: . amitriptyline  75 mg Oral QHS  . atorvastatin  20 mg Oral Daily  . dicyclomine  20 mg Oral TID AC & HS  . DULoxetine  60 mg Oral Daily  . enoxaparin (LOVENOX) injection  40 mg Subcutaneous Q24H  . folic acid  1 mg Oral Daily  . octreotide  100 mcg Subcutaneous Q8H  . potassium chloride  40 mEq Oral BID  . sodium chloride flush  10-40 mL Intracatheter Q12H  .  vitamin B-12  1,000 mcg Oral Daily   Continuous Infusions: . lactated ringers with kcl 125 mL/hr at 07/03/19 1030     LOS: 2 days    Time spent: 25  minutes    Edwin Dada, MD [facility] 07/03/2019, 1:29 PM     Please page though [place] or Epic secure chat:  For [employer], Adult nurse

## 2019-07-03 NOTE — Consult Note (Signed)
[facility] CONSULT NOTE    Date: 07/03/2019                  Patient Name:  Molly Cisneros  MRN: [id number]  DOB: [birth year]  Age / Sex: 34 y.o., female         PCP: Glean Hess, MD                 Service Requesting Consult: Hospitalist                 Reason for Consult:  Acute kidney injury, hypokalemia            History of Present Illness: Patient is a 34 y.o. female with a PMHx of chronic diarrhea, hypertension, hypokalemia, hyperlipidemia, anxiety, who was admitted to [facility] on 07/01/2019 for evaluation of diarrhea that has been chronic in nature along with nausea and abdominal pain.  She states that she has had significant diarrhea since April.  She is followed closely by Dr. Marius Ditch of gastroenterology.  She has had extensive work-up for her chronic diarrhea including upper and lower endoscopy, capsule endoscopy, celiac serologies, MR enterography.  She is also had C. difficile colitis in the past.  She appears to have a secretory type of diarrhea.  She is also had work-up for neuroendocrine tumor which came back normal.  The patient's baseline creatinine appears to be 0.96 with a BUN of 11.  This admission her creatinine has peaked at 1.54 and currently 1.40.  She did lose IV access and PICC line placement is currently pending.  She has ongoing hypokalemia with a serum potassium of 2.8.  Metabolic acidosis also present with a serum bicarbonate of 19.  Recent urinalysis was negative for proteinuria or hematuria.   Medications: Outpatient medications: Medications Prior to Admission  Medication Sig Dispense Refill Last Dose  . ALPRAZolam (XANAX) 1 MG tablet Take 1 mg by mouth 3 (three) times daily as needed for anxiety.    prn at prn  . amitriptyline (ELAVIL) 50 MG tablet Take 50 mg by mouth at bedtime.    06/30/2019 at Unknown time  . amphetamine-dextroamphetamine (ADDERALL XR) 30 MG 24 hr capsule Take 30 mg by mouth daily.    Past Month at Unknown time  .  amphetamine-dextroamphetamine (ADDERALL) 20 MG tablet Take 20 mg by mouth 2 (two) times daily.   Past Month at Unknown time  . atorvastatin (LIPITOR) 20 MG tablet TAKE (1) TABLET BY MOUTH EVERY DAY (Patient taking differently: Take 20 mg by mouth daily. ) 90 tablet 1 06/30/2019 at Unknown time  . dicyclomine (BENTYL) 20 MG tablet Take 1 tablet (20 mg total) by mouth 3 (three) times daily before meals. (Patient taking differently: Take 20 mg by mouth 4 (four) times daily -  before meals and at bedtime. ) 90 tablet 3 06/30/2019 at Unknown time  . DULoxetine (CYMBALTA) 60 MG capsule Take 60 mg by mouth daily.    06/30/2019 at Unknown time  . gabapentin (NEURONTIN) 300 MG capsule TAKE (1) CAPSULE BY MOUTH TWICE DAILY (Patient taking differently: Take 300 mg by mouth 2 (two) times daily as needed (pain). ) 60 capsule 2 prn at prn  . hydrOXYzine (VISTARIL) 25 MG capsule TAKE 1 TO 2 CAPSULES BY MOUTH EVERY 6 HOURS (Patient taking differently: Take 25-50 mg by mouth 4 (four) times daily as needed for anxiety or itching. ) 60 capsule 2 prn at prn  . ondansetron (ZOFRAN-ODT) 8 MG disintegrating tablet  TAKE 1 TABLET BY MOUTH EVERY 8 HOURS AS NEEDED FOR NAUSEA OR VOMITING. 20 tablet 0 prn at prn  . pantoprazole (PROTONIX) 40 MG tablet TAKE ONE TABLET BY MOUTH TWICE DAILY. 30MINUTES PRIOR TO MEALS (Patient taking differently: Take 40 mg by mouth 2 (two) times daily before a meal. ) 60 tablet 12 06/30/2019 at Unknown time  . potassium chloride 20 MEQ/15ML (10%) SOLN Take 15 mLs (20 mEq total) by mouth 2 (two) times daily. 473 mL 0 06/30/2019 at Unknown time  . promethazine (PHENERGAN) 25 MG tablet Take 1 tablet (25 mg total) by mouth at bedtime as needed for nausea or vomiting. 30 tablet 1 prn at prn  . traMADol (ULTRAM) 50 MG tablet Take 50 mg by mouth every 6 (six) hours as needed.   prn at prn  . valACYclovir (VALTREX) 1000 MG tablet Take 1,000 mg by mouth 2 (two) times daily as needed.   prn at prn  . octreotide  (SANDOSTATIN) 100 MCG/ML SOLN injection Inject 1 mL (100 mcg total) into the skin every 8 (eight) hours as needed (diarrhea). 30 mL 0 unknown at unknown    Current medications: Current Facility-Administered Medications  Medication Dose Route Frequency Provider Last Rate Last Admin  . acetaminophen (TYLENOL) tablet 650 mg  650 mg Oral Q6H PRN Ivor Costa, MD      . ALPRAZolam Duanne Moron) tablet 1 mg  1 mg Oral TID PRN Ivor Costa, MD      . amitriptyline (ELAVIL) tablet 75 mg  75 mg Oral QHS Lin Landsman, MD   75 mg at 07/02/19 2213  . atorvastatin (LIPITOR) tablet 20 mg  20 mg Oral Daily Ivor Costa, MD   20 mg at 07/02/19 2214  . dicyclomine (BENTYL) tablet 20 mg  20 mg Oral TID AC & HS Ivor Costa, MD   20 mg at 07/03/19 1425  . DULoxetine (CYMBALTA) DR capsule 60 mg  60 mg Oral Daily Ivor Costa, MD   60 mg at 07/03/19 [id number]  . enoxaparin (LOVENOX) injection 40 mg  40 mg Subcutaneous Q24H Ivor Costa, MD   40 mg at 07/02/19 2213  . [START ON [id number] folic acid (FOLVITE) tablet 2 mg  2 mg Oral Once Vanga, Tally Due, MD      . gabapentin (NEURONTIN) capsule 300 mg  300 mg Oral BID PRN Ivor Costa, MD      . hydrOXYzine (VISTARIL) capsule 25 mg  25 mg Oral QID PRN Ivor Costa, MD      . lactated ringers 1,000 mL with potassium chloride 10 mEq infusion   Intravenous Continuous Edwin Dada, MD 125 mL/hr at 07/03/19 1030 New Bag at 07/03/19 1030  . octreotide (SANDOSTATIN) injection 100 mcg  100 mcg Subcutaneous Q8H Ivor Costa, MD   100 mcg at 07/03/19 1426  . ondansetron (ZOFRAN) injection 4 mg  4 mg Intravenous Q6H PRN Edwin Dada, MD   4 mg at 07/02/19 1252  . oxyCODONE (Oxy IR/ROXICODONE) immediate release tablet 5 mg  5 mg Oral Q6H PRN Edwin Dada, MD   5 mg at 07/02/19 1251  . potassium chloride SA (KLOR-CON) CR tablet 40 mEq  40 mEq Oral BID Edwin Dada, MD   40 mEq at 07/03/19 [id number]  . promethazine (PHENERGAN) tablet 25 mg  25 mg Oral Q6H PRN  Danford, Suann Larry, MD       Or  . promethazine (PHENERGAN) injection 12.5 mg  12.5 mg Intravenous Q6H PRN Myrene Buddy  P, MD       Or  . promethazine (PHENERGAN) suppository 25 mg  25 mg Rectal Q6H PRN Danford, Suann Larry, MD      . sodium chloride flush (NS) 0.9 % injection 10-40 mL  10-40 mL Intracatheter Q12H Wohl, Darren, MD      . sodium chloride flush (NS) 0.9 % injection 10-40 mL  10-40 mL Intracatheter PRN Lucilla Lame, MD      . traMADol Veatrice Bourbon) tablet 50 mg  50 mg Oral Q6H PRN Ivor Costa, MD      . vitamin B-12 (CYANOCOBALAMIN) tablet 1,000 mcg  1,000 mcg Oral Daily Lin Landsman, MD   1,000 mcg at 07/03/19 [id number]      Allergies: Allergies  Allergen Reactions  . Shellfish Allergy Anaphylaxis  . Aloe Vera Dermatitis      Past Medical History: Past Medical History:  Diagnosis Date  . Acute appendicitis with localized peritonitis 11/06/2018  . Anxiety   . C. difficile diarrhea 02/05/2019  . Clostridium difficile diarrhea   . High cholesterol   . Hypertension   . Hypokalemia 02/05/2019  . Loss of weight   . Severe sepsis ([place]) 02/05/2019  . Shingles    reoccuring shingles on face     Past Surgical History: Past Surgical History:  Procedure Laterality Date  . COLONOSCOPY WITH PROPOFOL N/A 12/23/2018   Procedure: COLONOSCOPY WITH PROPOFOL;  Surgeon: Lin Landsman, MD;  Location[facility] ENDOSCOPY;  Service: Gastroenterology;  Laterality: N/A;  . ESOPHAGOGASTRODUODENOSCOPY (EGD) WITH PROPOFOL N/A 12/23/2018   Procedure: ESOPHAGOGASTRODUODENOSCOPY (EGD) WITH PROPOFOL;  Surgeon: Lin Landsman, MD;  Location: [facility] ENDOSCOPY;  Service: Gastroenterology;  Laterality: N/A;  . GIVENS CAPSULE STUDY N/A 06/14/2019   Procedure: GIVENS CAPSULE STUDY;  Surgeon: Lin Landsman, MD;  Location: [facility] ENDOSCOPY;  Service: Gastroenterology;  Laterality: N/A;  . GIVENS CAPSULE STUDY N/A 06/16/2019   Procedure: GIVENS CAPSULE STUDY;  Surgeon: Lin Landsman, MD;  Location: [facility] ENDOSCOPY;  Service: Gastroenterology;  Laterality: N/A;  . LAPAROSCOPIC APPENDECTOMY N/A 11/06/2018   Procedure: APPENDECTOMY LAPAROSCOPIC;  Surgeon: Herbert Pun, MD;  Location: [facility] ORS;  Service: General;  Laterality: N/A;     Family History: Family History  Problem Relation Age of Onset  . Non-Hodgkin's lymphoma Father 68       Basil Cell  . Pancreatic cancer Maternal Grandmother 59  . Thyroid cancer Maternal Grandmother 37  . Throat cancer Paternal Grandfather 16     Social History: Social History   Socioeconomic History  . Marital status: Single    Spouse name: Not on file  . Number of children: Not on file  . Years of education: Not on file  . Highest education level: Not on file  Occupational History  . Not on file  Tobacco Use  . Smoking status: Never Smoker  . Smokeless tobacco: Never Used  Substance and Sexual Activity  . Alcohol use: Yes    Alcohol/week: 2.0 standard drinks    Types: 2 Cans of beer per week  . Drug use: No  . Sexual activity: Not Currently    Birth control/protection: Inserts  Other Topics Concern  . Not on file  Social History Narrative  . Not on file   Social Determinants of Health   Financial Resource Strain:   . Difficulty of Paying Living Expenses: Not on file  Food Insecurity:   . Worried About Charity fundraiser in the Last Year: Not on file  . Ran Out  of Food in the Last Year: Not on file  Transportation Needs:   . Lack of Transportation (Medical): Not on file  . Lack of Transportation (Non-Medical): Not on file  Physical Activity:   . Days of Exercise per Week: Not on file  . Minutes of Exercise per Session: Not on file  Stress:   . Feeling of Stress : Not on file  Social Connections:   . Frequency of Communication with Friends and Family: Not on file  . Frequency of Social Gatherings with Friends and Family: Not on file  . Attends Religious Services: Not on file  . Active Member  of Clubs or Organizations: Not on file  . Attends Archivist Meetings: Not on file  . Marital Status: Not on file  Intimate Partner Violence:   . Fear of Current or Ex-Partner: Not on file  . Emotionally Abused: Not on file  . Physically Abused: Not on file  . Sexually Abused: Not on file     Review of Systems: Review of Systems  Constitutional: Positive for malaise/fatigue. Negative for chills and fever.  HENT: Negative for congestion, hearing loss and tinnitus.   Eyes: Negative for blurred vision and double vision.  Respiratory: Negative for cough, sputum production and shortness of breath.   Cardiovascular: Negative for chest pain, palpitations and orthopnea.  Gastrointestinal: Positive for abdominal pain, diarrhea and nausea. Negative for vomiting.  Genitourinary: Negative for dysuria, frequency and urgency.  Musculoskeletal: Negative for myalgias.  Skin: Negative for itching and rash.  Neurological: Negative for dizziness and focal weakness.  Endo/Heme/Allergies: Negative for polydipsia. Does not bruise/bleed easily.  Psychiatric/Behavioral: Positive for depression. The patient is nervous/anxious.      Vital Signs: Blood pressure 115/78, pulse 89, temperature 97.8 F (36.6 C), resp. rate 18, height 5\' 11"  (1.803 m), weight 97.7 kg, SpO2 99 %.  Weight trends: Filed Weights   07/01/19 1240  Weight: 97.7 kg    Physical Exam: General: NAD, laying in bed  Head: Normocephalic, atraumatic.  Eyes: Anicteric, EOMI  Nose: Mucous membranes moist, not inflammed, nonerythematous.  Throat: Oropharynx nonerythematous, no exudate appreciated.   Neck: Supple, trachea midline.  Lungs:  Normal respiratory effort. Clear to auscultation BL without crackles or wheezes.  Heart: RRR. S1 and S2 normal without gallop, murmur, or rubs.  Abdomen:  Mild epigastric tenderness, bowel sounds present  Extremities: No pretibial edema.  Neurologic: A&O X3, Motor strength is 5/5 in the  all 4 extremities  Skin: No visible rashes, scars.    Lab results: Basic Metabolic Panel: Recent Labs  Lab 07/01/19 1117 07/01/19 1117 07/01/19 1245 07/01/19 1640 07/02/19 0353 07/02/19 1119 07/03/19 0511  NA 114*   < > 118*   < > 127* 133* 135  K 2.2*   < > 2.1*   < > 2.9* 3.0* 2.8*  CL 85*   < > 85*   < > 99 103 108  CO2 18*   < > 19*   < > 19* 21* 19*  GLUCOSE 104*   < > 99   < > 133* 106* 115*  BUN 16   < > 16   < > 11 10 12   CREATININE 1.41*   < > 1.53*   < > 1.34* 1.31* 1.40*  CALCIUM 9.0   < > 9.4   < > 8.0* 8.5* 8.3*  MG 1.8  --  2.1  --   --   --   --   PHOS 2.7  --  3.0  --   --   --   --    < > = values in this interval not displayed.    Liver Function Tests: Recent Labs  Lab 07/01/19 1117  AST 22  ALT 19  ALKPHOS 56  BILITOT 0.4  PROT 7.3  ALBUMIN 3.9   No results for input(s): LIPASE, AMYLASE in the last 168 hours. No results for input(s): AMMONIA in the last 168 hours.  CBC: Recent Labs  Lab 07/01/19 1245 07/02/19 0353 07/03/19 0511  WBC 12.2* 8.7 8.4  NEUTROABS 7.6  --   --   HGB 13.2 11.1* 10.8*  HCT 35.3* 30.0* 30.0*  MCV 77.6* 79.8* 81.3  PLT 617* 491* 499*    Cardiac Enzymes: No results for input(s): CKTOTAL, CKMB, CKMBINDEX, TROPONINI in the last 168 hours.  BNP: Invalid input(s): POCBNP  CBG: No results for input(s): GLUCAP in the last 168 hours.  Microbiology: Results for orders placed or performed during the hospital encounter of 07/01/19  SARS CORONAVIRUS 2 (TAT 6-24 HRS) Nasopharyngeal Nasopharyngeal Swab     Status: None   Collection Time: 07/01/19  3:05 PM   Specimen: Nasopharyngeal Swab  Result Value Ref Range Status   SARS Coronavirus 2 NEGATIVE NEGATIVE Final    Comment: (NOTE) SARS-CoV-2 target nucleic acids are NOT DETECTED. The SARS-CoV-2 RNA is generally detectable in upper and lower respiratory specimens during the acute phase of infection. Negative results do not preclude SARS-CoV-2 infection, do not rule  out co-infections with other pathogens, and should not be used as the sole basis for treatment or other patient management decisions. Negative results must be combined with clinical observations, patient history, and epidemiological information. The expected result is Negative. Fact Sheet for Patients: SugarRoll.be Fact Sheet for Healthcare Providers: https://www.woods-mathews.com/ This test is not yet approved or cleared by the [place] FDA and  has been authorized for detection and/or diagnosis of SARS-CoV-2 by FDA under an Emergency Use Authorization (EUA). This EUA will remain  in effect (meaning this test can be used) for the duration of the COVID-19 declaration under Section 56 4(b)(1) of the Act, 21 U.S.C. section 360bbb-3(b)(1), unless the authorization is terminated or revoked sooner. Performed at [facility], [place] [address], [place], [place] [address]   CULTURE, BLOOD (ROUTINE X 2) w Reflex to ID Panel     Status: None (Preliminary result)   Collection Time: 07/01/19  6:42 PM   Specimen: BLOOD  Result Value Ref Range Status   Specimen Description BLOOD RIGHT ANTECUBITAL  Final   Special Requests   Final    BOTTLES DRAWN AEROBIC AND ANAEROBIC Blood Culture adequate volume   Culture   Final    NO GROWTH 2 DAYS Performed at [facility], [address], [place], [place] [address]    Report Status PENDING  Incomplete  CULTURE, BLOOD (ROUTINE X 2) w Reflex to ID Panel     Status: None (Preliminary result)   Collection Time: 07/01/19  6:54 PM   Specimen: BLOOD  Result Value Ref Range Status   Specimen Description BLOOD BLOOD RIGHT HAND  Final   Special Requests   Final    BOTTLES DRAWN AEROBIC AND ANAEROBIC Blood Culture adequate volume   Culture   Final    NO GROWTH 2 DAYS Performed at [facility], [address] [place], [place] [address]    Report Status PENDING  Incomplete    Coagulation  Studies: No results for input(s): LABPROT, INR  in the last 72 hours.  Urinalysis: No results for input(s): COLORURINE, LABSPEC, PHURINE, GLUCOSEU, HGBUR, BILIRUBINUR, KETONESUR, PROTEINUR, UROBILINOGEN, NITRITE, LEUKOCYTESUR in the last 72 hours.  Invalid input(s): APPERANCEUR    Imaging:  No results found.   Assessment & Plan: Pt is a 34 y.o. female with a PMHx of chronic diarrhea, hypertension, hypokalemia, hyperlipidemia, anxiety, who was admitted to [facility] on 07/01/2019 for evaluation of diarrhea that has been chronic in nature along with nausea and abdominal pain.  She states that she has had significant diarrhea since April.  She is followed   1.  Acute kidney injury likely secondary to diarrhea and prolonged volume depletion. 2.  Hypokalemia likely due to diarrhea. 3.  Metabolic acidosis 4.  Chronic diarrhea being followed closely by gastroenterology.  Plan: Patient has a very interesting case.  She has had a chronic secretory diarrhea that is being closely followed by gastroenterology.  She has had multiple episodes of acute kidney injury in the past likely related to her diarrhea.  She was found to have dilute urine which is often found in acute kidney injury given tubular injury.  Agree with placing PICC line now and reinitiating IV fluid hydration along with potassium supplementation.  We will also administer potassium chloride 40 mEq IV total today.  Further plan as patient progresses.

## 2019-07-04 ENCOUNTER — Inpatient Hospital Stay: Payer: PRIVATE HEALTH INSURANCE | Admitting: Internal Medicine

## 2019-07-04 LAB — RENAL FUNCTION PANEL
Albumin: 3.2 g/dL — ABNORMAL LOW (ref 3.5–5.0)
Anion gap: 6 (ref 5–15)
BUN: 13 mg/dL (ref 6–20)
CO2: 21 mmol/L — ABNORMAL LOW (ref 22–32)
Calcium: 8.8 mg/dL — ABNORMAL LOW (ref 8.9–10.3)
Chloride: 107 mmol/L (ref 98–111)
Creatinine, Ser: 1.22 mg/dL — ABNORMAL HIGH (ref 0.44–1.00)
GFR calc Af Amer: >60 mL/min (ref >60)
GFR calc non Af Amer: 57 mL/min — ABNORMAL LOW (ref >60)
Glucose, Bld: 96 mg/dL (ref 70–99)
Phosphorus: 1.4 mg/dL — ABNORMAL LOW (ref 2.5–4.6)
Potassium: 3.2 mmol/L — ABNORMAL LOW (ref 3.5–5.1)
Sodium: 134 mmol/L — ABNORMAL LOW (ref 135–145)

## 2019-07-04 MED ORDER — POTASSIUM CHLORIDE 10 MEQ/100ML IV SOLN
10.0000 meq | INTRAVENOUS | Status: DC
  Administered 2019-07-04: 10 meq via INTRAVENOUS
  Filled 2019-07-04: qty 100

## 2019-07-04 MED ORDER — POTASSIUM PHOSPHATE MONOBASIC 500 MG PO TABS
500.0000 mg | ORAL_TABLET | Freq: Three times a day (TID) | ORAL | Status: DC
  Administered 2019-07-04 (×2): 500 mg via ORAL
  Filled 2019-07-04 (×9): qty 1

## 2019-07-04 MED ORDER — POTASSIUM CHLORIDE CRYS ER 20 MEQ PO TBCR
40.0000 meq | EXTENDED_RELEASE_TABLET | Freq: Every day | ORAL | Status: DC
  Administered 2019-07-04: 40 meq via ORAL
  Filled 2019-07-04: qty 2

## 2019-07-04 MED ORDER — POTASSIUM CHLORIDE 10 MEQ/100ML IV SOLN
10.0000 meq | INTRAVENOUS | Status: AC
  Administered 2019-07-04 (×3): 10 meq via INTRAVENOUS
  Filled 2019-07-04 (×4): qty 100

## 2019-07-04 NOTE — Progress Notes (Addendum)
PROGRESS NOTE    Molly Cisneros  [id number] DOB: Feb 24, 1986 DOA: 07/01/2019 PCP: Glean Hess, MD      Brief Narrative:  [name] is a 34 y.o. F with chronic diarrhea, HTN, hx Cdiff and mood disorder who presented to her oncologist's office with few days worsening of her chronic abdominal pain and diarrhea and was sent to the ER.  In the ER, BP 89/60, patient pale and syncopized at the triage desk.  WBC 12K, K 2.1.  Na 118, Cr 1.53 (from baseline 0.9).        Assessment & Plan:  Severe chronic diarrhea causing severe symptomatic hypokalemia, hyponatremia, and acute kidney injury Patient admitted and started on octreotide, IV fluids, home dicyclomine.    Patient's stool frequency slowed to 3 per day with  Repeat orthostatics today improved -Continue octreotide -Continue IV fluids -Consult GI, appreciate expert advice -Continue dicyclomine     Hypokalemia K 2.1 > 2.9 > 3.1 > 2.8 > 3.2 today  -Continue oral and IV K -Continue IV fluids with K  Hyponatremia Resolved  Acute kidney injury Non-gap metabolic acidosis Improving.  Likely hypovolemic injury. -Continue IV fluids -Consult Nephrology  Hypertension Chart history, not on meds  Other medications -Continue amitriptyline -Continue atorvastatin -Continue duloxetine -Hold pantoprazole for now  Anemia This is likely all dilutional.  Mood disorder -Hold Adderall         Disposition: Prior to admission, the patient reports months of severe chronic diarrhea, typically >6 stools/day.  She is prescribed an aggressive antimotility and electrolyte replacement regimen.  Despite adherence to this regimen, she has been admitted to the hospital for diarrhea related complications 5 times in the last 12 months, and twice in the last 2 months.  At present, she presented with acute kidney injury, severe electrolyte derangement, syncope, and orthostatic hypotension blood pressure <80/50 mmHg.    While  hospitalized here for fluids and electrolyte repletion, the patient was treated with subcutaneous octreotide in addition to her home regimen and had marked improvement in stool frequency and consistency.           MDM: The below labs and imaging reports reviewed and summarized above.  Medication management as above.      DVT prophylaxis: Lovenox Code Status: FULL Family Communication:     Consultants:   GI  Nephrology  Procedures:     Antimicrobials:      Culture data:   2/12 GI pathogen panel still pending  2/12 blood cultures x2 NG          Subjective: Patient has persistent nausea and abdominal cramping.  She is weak and tired when she walks around.  She has had 3 stools per day for the last 24 hours which is significantly improved from prior to admission.  No fever, confusion, vomiting, rectal bleeding.         Objective: Vitals:   07/03/19 0509 07/03/19 1129 07/03/19 2020 07/04/19 0501  BP: 102/70 115/78 96/67 112/70  Pulse: 86 89 98 91  Resp: 18  20 16   Temp: 97.9 F (36.6 C) 97.8 F (36.6 C) 98.1 F (36.7 C) (!) 97.3 F (36.3 C)  TempSrc: Oral  Oral Oral  SpO2: 98% 99% 100% 98%  Weight:      Height:        Intake/Output Summary (Last 24 hours) at 07/04/2019 1257 Last data filed at 07/04/2019 1109 Gross per 24 hour  Intake 2578.3 ml  Output --  Net 2578.3 ml  Filed Weights   07/01/19 1240  Weight: 97.7 kg    Examination: General appearance:  adult female, alert and in no acute distress.  Appears weak and tired. HEENT: Anicteric, conjunctiva pink, lids and lashes normal. No nasal deformity, discharge, epistaxis.  Lips dry, teeth normal. OP tacky dry, no oral lesions.   Skin: Warm and dry.  No suspicious rashes or lesions. Cardiac: RRR, no murmurs appreciated.  No LE edema.    Respiratory: Normal respiratory rate and rhythm.  CTAB without rales or wheezes. Abdomen: Abdomen soft.  Minimal nonfocal TTP. No ascites,  distension, hepatosplenomegaly.   MSK: No deformities or effusions of the large joints of the upper or lower extremities bilaterally. Neuro: Awake and alert. Naming is grossly intact, and the patient's recall, recent and remote, as well as general fund of knowledge seem within normal limits.  Muscle tone normal, without fasciculations.  Moves all extremities equally and with normal coordination, appears generally weak.  Speech fluent.    Psych: Sensorium intact and responding to questions, attention normal. Affect blunted.  Judgment and insight appear normal.         Data Reviewed: I have personally reviewed following labs and imaging studies:  CBC: Recent Labs  Lab 07/01/19 1245 07/02/19 0353 07/03/19 0511  WBC 12.2* 8.7 8.4  NEUTROABS 7.6  --   --   HGB 13.2 11.1* 10.8*  HCT 35.3* 30.0* 30.0*  MCV 77.6* 79.8* 81.3  PLT 617* 491* [id number]*   Basic Metabolic Panel: Recent Labs  Lab 07/01/19 1117 07/01/19 1117 07/01/19 1245 07/01/19 1640 07/01/19 2206 07/02/19 0353 07/02/19 1119 07/03/19 0511 07/04/19 0538  NA 114*   < > 118*   < > 124* 127* 133* 135 134*  K 2.2*   < > 2.1*   < > 2.6* 2.9* 3.0* 2.8* 3.2*  CL 85*   < > 85*   < > 93* 99 103 108 107  CO2 18*   < > 19*   < > 22 19* 21* 19* 21*  GLUCOSE 104*   < > 99   < > 134* 133* 106* 115* 96  BUN 16   < > 16   < > 12 11 10 12 13   CREATININE 1.41*   < > 1.53*   < > 1.41* 1.34* 1.31* 1.40* 1.22*  CALCIUM 9.0   < > 9.4   < > 8.5* 8.0* 8.5* 8.3* 8.8*  MG 1.8  --  2.1  --   --   --   --   --   --   PHOS 2.7  --  3.0  --   --   --   --   --  1.4*   < > = values in this interval not displayed.   GFR: Estimated Creatinine Clearance: 82.9 mL/min (A) (by C-G formula based on SCr of 1.22 mg/dL (H)). Liver Function Tests: Recent Labs  Lab 07/01/19 1117 07/04/19 0538  AST 22  --   ALT 19  --   ALKPHOS 56  --   BILITOT 0.4  --   PROT 7.3  --   ALBUMIN 3.9 3.2*   No results for input(s): LIPASE, AMYLASE in the last 168  hours. No results for input(s): AMMONIA in the last 168 hours. Coagulation Profile: No results for input(s): INR, PROTIME in the last 168 hours. Cardiac Enzymes: No results for input(s): CKTOTAL, CKMB, CKMBINDEX, TROPONINI in the last 168 hours. BNP (last 3 results) No results for input(s): PROBNP in  the last 8760 hours. HbA1C: No results for input(s): HGBA1C in the last 72 hours. CBG: No results for input(s): GLUCAP in the last 168 hours. Lipid Profile: No results for input(s): CHOL, HDL, LDLCALC, TRIG, CHOLHDL, LDLDIRECT in the last 72 hours. Thyroid Function Tests: No results for input(s): TSH, T4TOTAL, FREET4, T3FREE, THYROIDAB in the last 72 hours. Anemia Panel: No results for input(s): VITAMINB12, FOLATE, FERRITIN, TIBC, IRON, RETICCTPCT in the last 72 hours. Urine analysis:    Component Value Date/Time   COLORURINE STRAW (A) 06/11/2019 0801   APPEARANCEUR CLEAR (A) 06/11/2019 0801   LABSPEC 1.001 (L) 06/11/2019 0801   PHURINE 7.0 06/11/2019 0801   GLUCOSEU NEGATIVE 06/11/2019 0801   HGBUR NEGATIVE 06/11/2019 0801   BILIRUBINUR NEGATIVE 06/11/2019 0801   KETONESUR NEGATIVE 06/11/2019 0801   PROTEINUR NEGATIVE 06/11/2019 0801   NITRITE NEGATIVE 06/11/2019 0801   LEUKOCYTESUR NEGATIVE 06/11/2019 0801   Sepsis Labs: @LABRCNTIP (procalcitonin:4,lacticacidven:4)  ) Recent Results (from the past 240 hour(s))  SARS CORONAVIRUS 2 (TAT 6-24 HRS) Nasopharyngeal Nasopharyngeal Swab     Status: None   Collection Time: 07/01/19  3:05 PM   Specimen: Nasopharyngeal Swab  Result Value Ref Range Status   SARS Coronavirus 2 NEGATIVE NEGATIVE Final    Comment: (NOTE) SARS-CoV-2 target nucleic acids are NOT DETECTED. The SARS-CoV-2 RNA is generally detectable in upper and lower respiratory specimens during the acute phase of infection. Negative results do not preclude SARS-CoV-2 infection, do not rule out co-infections with other pathogens, and should not be used as the sole basis  for treatment or other patient management decisions. Negative results must be combined with clinical observations, patient history, and epidemiological information. The expected result is Negative. Fact Sheet for Patients: SugarRoll.be Fact Sheet for Healthcare Providers: https://www.woods-mathews.com/ This test is not yet approved or cleared by the [place] FDA and  has been authorized for detection and/or diagnosis of SARS-CoV-2 by FDA under an Emergency Use Authorization (EUA). This EUA will remain  in effect (meaning this test can be used) for the duration of the COVID-19 declaration under Section 56 4(b)(1) of the Act, 21 U.S.C. section 360bbb-3(b)(1), unless the authorization is terminated or revoked sooner. Performed at [facility], [place] [address], [place], [place] [address]   CULTURE, BLOOD (ROUTINE X 2) w Reflex to ID Panel     Status: None (Preliminary result)   Collection Time: 07/01/19  6:42 PM   Specimen: BLOOD  Result Value Ref Range Status   Specimen Description BLOOD RIGHT ANTECUBITAL  Final   Special Requests   Final    BOTTLES DRAWN AEROBIC AND ANAEROBIC Blood Culture adequate volume   Culture   Final    NO GROWTH 3 DAYS Performed at [facility], [address] [place], [place] [address]    Report Status PENDING  Incomplete  CULTURE, BLOOD (ROUTINE X 2) w Reflex to ID Panel     Status: None (Preliminary result)   Collection Time: 07/01/19  6:54 PM   Specimen: BLOOD  Result Value Ref Range Status   Specimen Description BLOOD BLOOD RIGHT HAND  Final   Special Requests   Final    BOTTLES DRAWN AEROBIC AND ANAEROBIC Blood Culture adequate volume   Culture   Final    NO GROWTH 3 DAYS Performed at [facility], [address], [place], [place] [address]    Report Status PENDING  Incomplete         Radiology Studies: No results found.  Scheduled Meds: . amitriptyline  75 mg  Oral QHS  . atorvastatin  20 mg Oral Daily  . dicyclomine  20 mg Oral TID AC & HS  . DULoxetine  60 mg Oral Daily  . enoxaparin (LOVENOX) injection  40 mg Subcutaneous Q24H  . octreotide  100 mcg Subcutaneous Q8H  . potassium chloride  40 mEq Oral QHS  . potassium phosphate (monobasic)  500 mg Oral TID WC & HS  . sodium chloride flush  10-40 mL Intracatheter Q12H  . vitamin B-12  1,000 mcg Oral Daily   Continuous Infusions: . sodium chloride Stopped (07/03/19 2312)  . lactated ringers with kcl 125 mL/hr at 07/04/19 0524  . potassium chloride       LOS: 3 days    Time spent: 25 minutes    Edwin Dada, MD [facility] 07/04/2019, 12:57 PM     Please page though [place] or Epic secure chat:  For [employer], Adult nurse

## 2019-07-04 NOTE — Progress Notes (Signed)
[facility]  ROUNDING NOTE   Subjective:  The frequency of her diarrhea has decreased significantly. Sodium now 134. Potassium improved to 3.2. Renal function also improved as creatinine down to 1.2. Phosphorus was also low at 1.4 however patient received some phosphorus repletion as well.   Objective:  Vital signs in last 24 hours:  Temp:  [97.3 F (36.3 C)-98.1 F (36.7 C)] 97.7 F (36.5 C) (02/15 1259) Pulse Rate:  [91-101] 101 (02/15 1259) Resp:  [16-20] 16 (02/15 0501) BP: (96-137)/(67-95) 137/95 (02/15 1259) SpO2:  [98 %-100 %] 100 % (02/15 1259)  Weight change:  Filed Weights   07/01/19 1240  Weight: 97.7 kg    Intake/Output: I/O last 3 completed shifts: In: 2338.3 [I.V.:2018.4; IV Piggyback:319.9] Out: 0    Intake/Output this shift:  Total I/O In: 240 [P.O.:240] Out: -   Physical Exam: General: No acute distress  Head: Normocephalic, atraumatic. Moist oral mucosal membranes  Eyes: Anicteric  Neck: Supple, trachea midline  Lungs:  Clear to auscultation, normal effort  Heart: S1S2 no rubs  Abdomen:  Mild mid abdominal tenderness, no rebound  Extremities: No peripheral edema.  Neurologic: Awake, alert, following commands  Skin: No lesions  Access:     Basic Metabolic Panel: Recent Labs  Lab 07/01/19 1117 07/01/19 1117 07/01/19 1245 07/01/19 1640 07/01/19 2206 07/01/19 2206 07/02/19 0353 07/02/19 0353 07/02/19 1119 07/03/19 0511 07/04/19 0538  NA 114*   < > 118*   < > 124*  --  127*  --  133* 135 134*  K 2.2*   < > 2.1*   < > 2.6*  --  2.9*  --  3.0* 2.8* 3.2*  CL 85*   < > 85*   < > 93*  --  99  --  103 108 107  CO2 18*   < > 19*   < > 22  --  19*  --  21* 19* 21*  GLUCOSE 104*   < > 99   < > 134*  --  133*  --  106* 115* 96  BUN 16   < > 16   < > 12  --  11  --  10 12 13   CREATININE 1.41*   < > 1.53*   < > 1.41*  --  1.34*  --  1.31* 1.40* 1.22*  CALCIUM 9.0   < > 9.4   < > 8.5*   < > 8.0*   < > 8.5* 8.3* 8.8*  MG 1.8  --   2.1  --   --   --   --   --   --   --   --   PHOS 2.7  --  3.0  --   --   --   --   --   --   --  1.4*   < > = values in this interval not displayed.    Liver Function Tests: Recent Labs  Lab 07/01/19 1117 07/04/19 0538  AST 22  --   ALT 19  --   ALKPHOS 56  --   BILITOT 0.4  --   PROT 7.3  --   ALBUMIN 3.9 3.2*   No results for input(s): LIPASE, AMYLASE in the last 168 hours. No results for input(s): AMMONIA in the last 168 hours.  CBC: Recent Labs  Lab 07/01/19 1245 07/02/19 0353 07/03/19 0511  WBC 12.2* 8.7 8.4  NEUTROABS 7.6  --   --   HGB 13.2 11.1*  10.8*  HCT 35.3* 30.0* 30.0*  MCV 77.6* 79.8* 81.3  PLT 617* 491* 499*    Cardiac Enzymes: No results for input(s): CKTOTAL, CKMB, CKMBINDEX, TROPONINI in the last 168 hours.  BNP: Invalid input(s): POCBNP  CBG: No results for input(s): GLUCAP in the last 168 hours.  Microbiology: Results for orders placed or performed during the hospital encounter of 07/01/19  SARS CORONAVIRUS 2 (TAT 6-24 HRS) Nasopharyngeal Nasopharyngeal Swab     Status: None   Collection Time: 07/01/19  3:05 PM   Specimen: Nasopharyngeal Swab  Result Value Ref Range Status   SARS Coronavirus 2 NEGATIVE NEGATIVE Final    Comment: (NOTE) SARS-CoV-2 target nucleic acids are NOT DETECTED. The SARS-CoV-2 RNA is generally detectable in upper and lower respiratory specimens during the acute phase of infection. Negative results do not preclude SARS-CoV-2 infection, do not rule out co-infections with other pathogens, and should not be used as the sole basis for treatment or other patient management decisions. Negative results must be combined with clinical observations, patient history, and epidemiological information. The expected result is Negative. Fact Sheet for Patients: SugarRoll.be Fact Sheet for Healthcare Providers: https://www.woods-mathews.com/ This test is not yet approved or cleared by the  [place] FDA and  has been authorized for detection and/or diagnosis of SARS-CoV-2 by FDA under an Emergency Use Authorization (EUA). This EUA will remain  in effect (meaning this test can be used) for the duration of the COVID-19 declaration under Section 56 4(b)(1) of the Act, 21 U.S.C. section 360bbb-3(b)(1), unless the authorization is terminated or revoked sooner. Performed at [facility], [place] [address] [place], [place] [address]   CULTURE, BLOOD (ROUTINE X 2) w Reflex to ID Panel     Status: None (Preliminary result)   Collection Time: 07/01/19  6:42 PM   Specimen: BLOOD  Result Value Ref Range Status   Specimen Description BLOOD RIGHT ANTECUBITAL  Final   Special Requests   Final    BOTTLES DRAWN AEROBIC AND ANAEROBIC Blood Culture adequate volume   Culture   Final    NO GROWTH 3 DAYS Performed at [facility], [address], [place], [place] [address]    Report Status PENDING  Incomplete  CULTURE, BLOOD (ROUTINE X 2) w Reflex to ID Panel     Status: None (Preliminary result)   Collection Time: 07/01/19  6:54 PM   Specimen: BLOOD  Result Value Ref Range Status   Specimen Description BLOOD BLOOD RIGHT HAND  Final   Special Requests   Final    BOTTLES DRAWN AEROBIC AND ANAEROBIC Blood Culture adequate volume   Culture   Final    NO GROWTH 3 DAYS Performed at [facility], [address], [place], [place] [address]    Report Status PENDING  Incomplete    Coagulation Studies: No results for input(s): LABPROT, INR in the last 72 hours.  Urinalysis: No results for input(s): COLORURINE, LABSPEC, PHURINE, GLUCOSEU, HGBUR, BILIRUBINUR, KETONESUR, PROTEINUR, UROBILINOGEN, NITRITE, LEUKOCYTESUR in the last 72 hours.  Invalid input(s): APPERANCEUR    Imaging: No results found.   Medications:   . sodium chloride Stopped (07/03/19 2312)  . lactated ringers with kcl 125 mL/hr at 07/04/19 1415  . potassium chloride 10 mEq (07/04/19 1416)    . amitriptyline  75 mg Oral QHS  . atorvastatin  20 mg Oral Daily  . dicyclomine  20 mg Oral TID AC & HS  . DULoxetine  60 mg Oral Daily  . enoxaparin (  LOVENOX) injection  40 mg Subcutaneous Q24H  . octreotide  100 mcg Subcutaneous Q8H  . potassium chloride  40 mEq Oral QHS  . potassium phosphate (monobasic)  500 mg Oral TID WC & HS  . sodium chloride flush  10-40 mL Intracatheter Q12H  . vitamin B-12  1,000 mcg Oral Daily   sodium chloride, acetaminophen, ALPRAZolam, gabapentin, hydrOXYzine, ondansetron (ZOFRAN) IV, oxyCODONE, promethazine **OR** promethazine **OR** promethazine, sodium chloride flush, traMADol  Assessment/ Plan:  34 y.o. female with a PMHx of chronic diarrhea, hypertension, hypokalemia, hyperlipidemia, anxiety, who was admitted to [facility] on 07/01/2019 for evaluation of diarrhea that has been chronic in nature along with nausea and abdominal pain.  She states that she has had significant diarrhea since April.   1.  Acute kidney injury likely secondary to diarrhea and prolonged volume depletion. 2.  Hypokalemia likely due to diarrhea. 3.  Metabolic acidosis 4.  Chronic diarrhea being followed closely by gastroenterology.  Plan: The patient's acute kidney injury has improved with IV fluid hydration.  Continue lactated Ringer's for now.  The frequency of her diarrhea has also slowed.  This is encouraging.  Serum potassium also improved to 3.2.  Continue both IV and oral repletion.  Phosphorus also noted to be low and is being repleted.  Anticipate discharge tomorrow.   LOS: 3 Molly Cisneros 2/15/20215:21 PM

## 2019-07-05 ENCOUNTER — Other Ambulatory Visit: Payer: Self-pay | Admitting: Pharmacist

## 2019-07-05 ENCOUNTER — Other Ambulatory Visit: Payer: Self-pay | Admitting: Hematology and Oncology

## 2019-07-05 DIAGNOSIS — K529 Noninfective gastroenteritis and colitis, unspecified: Secondary | ICD-10-CM

## 2019-07-05 LAB — PHOSPHORUS: Phosphorus: 1.5 mg/dL — ABNORMAL LOW (ref 2.5–4.6)

## 2019-07-05 LAB — RENAL FUNCTION PANEL
Albumin: 2.9 g/dL — ABNORMAL LOW (ref 3.5–5.0)
Anion gap: 6 (ref 5–15)
BUN: 10 mg/dL (ref 6–20)
CO2: 25 mmol/L (ref 22–32)
Calcium: 8.6 mg/dL — ABNORMAL LOW (ref 8.9–10.3)
Chloride: 107 mmol/L (ref 98–111)
Creatinine, Ser: 1.15 mg/dL — ABNORMAL HIGH (ref 0.44–1.00)
GFR calc Af Amer: >60 mL/min (ref >60)
GFR calc non Af Amer: >60 mL/min (ref >60)
Glucose, Bld: 97 mg/dL (ref 70–99)
Phosphorus: 1.4 mg/dL — ABNORMAL LOW (ref 2.5–4.6)
Potassium: 3.1 mmol/L — ABNORMAL LOW (ref 3.5–5.1)
Sodium: 138 mmol/L (ref 135–145)

## 2019-07-05 LAB — POTASSIUM: Potassium: 3.5 mmol/L (ref 3.5–5.1)

## 2019-07-05 MED ORDER — POTASSIUM CHLORIDE CRYS ER 20 MEQ PO TBCR
40.0000 meq | EXTENDED_RELEASE_TABLET | Freq: Two times a day (BID) | ORAL | Status: DC
  Administered 2019-07-05: 40 meq via ORAL
  Filled 2019-07-05: qty 2

## 2019-07-05 MED ORDER — "TUBERCULIN SYRINGE 27G X 1/2"" 1 ML MISC": 0 refills | Status: DC

## 2019-07-05 MED ORDER — SODIUM PHOSPHATES 45 MMOLE/15ML IV SOLN
20.0000 mmol | Freq: Once | INTRAVENOUS | Status: AC
  Administered 2019-07-05: 20 mmol via INTRAVENOUS
  Filled 2019-07-05: qty 6.67

## 2019-07-05 MED ORDER — SODIUM PHOSPHATES 45 MMOLE/15ML IV SOLN
30.0000 mmol | Freq: Once | INTRAVENOUS | Status: AC
  Administered 2019-07-05: 30 mmol via INTRAVENOUS
  Filled 2019-07-05: qty 10

## 2019-07-05 MED ORDER — DIPHENOXYLATE-ATROPINE 2.5-0.025 MG PO TABS
2.0000 | ORAL_TABLET | Freq: Four times a day (QID) | ORAL | Status: DC | PRN
  Administered 2019-07-05: 2 via ORAL
  Filled 2019-07-05: qty 2

## 2019-07-05 MED ORDER — OCTREOTIDE ACETATE 200 MCG/ML IJ SOLN: 100.0000 ug | Freq: Three times a day (TID) | INTRAMUSCULAR | 0 refills | Status: DC | PRN

## 2019-07-05 MED ORDER — POTASSIUM PHOSPHATE MONOBASIC 500 MG PO TABS: 500.0000 mg | ORAL_TABLET | Freq: Three times a day (TID) | ORAL | Status: DC

## 2019-07-05 MED ORDER — POTASSIUM PHOSPHATE MONOBASIC 500 MG PO TABS
500.0000 mg | ORAL_TABLET | Freq: Three times a day (TID) | ORAL | Status: DC
  Administered 2019-07-05 – 2019-07-06 (×5): 500 mg via ORAL
  Filled 2019-07-05 (×7): qty 1

## 2019-07-05 MED ORDER — POTASSIUM CHLORIDE 10 MEQ/100ML IV SOLN
10.0000 meq | INTRAVENOUS | Status: AC
  Administered 2019-07-05 (×4): 10 meq via INTRAVENOUS
  Filled 2019-07-05 (×4): qty 100

## 2019-07-05 NOTE — Progress Notes (Signed)
PROGRESS NOTE    EMPRYSS NEWNUM  [id number] DOB: Oct 13, 1985 DOA: 07/01/2019 PCP: Glean Hess, MD      Brief Narrative:  Molly Cisneros is a 34 y.o. F with chronic diarrhea, HTN, hx Cdiff and mood disorder who presented to her oncologist's office with few days worsening of her chronic abdominal pain and diarrhea and was sent to the ER.  In the ER, BP 89/60, patient pale and syncopized at the triage desk.  WBC 12K, K 2.1.  Na 118, Cr 1.53 (from baseline 0.9).        Assessment & Plan:  Severe chronic diarrhea causing severe symptomatic hypokalemia, hyponatremia, and acute kidney injury Patient admitted and started on octreotide, IV fluids, home dicyclomine.    More diarrhea overnight.  Very nauseated and unable to eat breakfast, potassium still 3.1 despite aggressive IV and oral supplementation -Continue octreotide -Continue IV fluids with K -Continue IV K phos  -Continue oral K dur -Consult GI, appreciate expert advice -Continue dicyclomine, Lomotil     Hypokalemia Admitted with severe hypokalemia 2.1.  Mag normal.  K supplemented aggressively.  K 2.1 > 2.9 > 3.1 > 2.8 > 3.2 > down to 3.1 today  -Continue oral and IV K  Hyponatremia  Acute kidney injury Non-gap metabolic acidosis Cr 99991111 at admission (baseline 0.9).  Treated with fluids and resolved.  Likely hypovolemic injury. -Continue IV fluids -Consult Nephrology  Hypertension Chart history, not on meds  Other medications -Continue amitriptyline -Continue atorvastatin -Continue duloxetine -Hold pantoprazole for now  Anemia This is likely all dilutional.  Mood disorder -Hold Adderall         Disposition: Patient still hypokalemic and weak despite aggressive oral and IV electrolyte supplementation.  Will increase K and Phos supplements today.    If better, likely home tomorrow, as octreotide has been approved and ordered by Dr. Mike Gip from [facility] after much diligent work and will be  delivered from [facility] [employer] to the patient on Thursday at home.  At discharge, patient should get 1 or 2 of her daily doses of octreotide on Wednesday and then will have medicine available on Thursday, per my communcation with Dr. Mike Gip.        MDM: The below labs and imaging reports reviewed and summarized above.  Medication management as above.    DVT prophylaxis: Lovenox Code Status: FULL Family Communication: Dr. Barbette Reichmann at the bedside    Consultants:   GI  Nephrology  Procedures:     Antimicrobials:      Culture data:   2/12 GI pathogen panel still pending  2/12 blood cultures x2 NG          Subjective: Very nauseated today, no vomiting.  Dizzy and very weak.   No hematochezia.  Stool number increased.       Objective: Vitals:   07/04/19 0501 07/04/19 1259 07/04/19 1937 07/05/19 1154  BP: 112/70 (!) 137/95 106/64 110/68  Pulse: 91 (!) 101 83 94  Resp: 16  17 16   Temp: (!) 97.3 F (36.3 C) 97.7 F (36.5 C) 98.9 F (37.2 C) 98.6 F (37 C)  TempSrc: Oral Oral Oral Oral  SpO2: 98% 100% 99% (!) 89%  Weight:      Height:        Intake/Output Summary (Last 24 hours) at 07/05/2019 1951 Last data filed at 07/05/2019 1500 Gross per 24 hour  Intake 2042.97 ml  Output --  Net 2042.97 ml   Filed Weights   07/01/19 1240  Weight: 97.7 kg    Examination: General appearance:  adult female, alert and in no acute distress but appears sick and weak.   HEENT: Anicteric, conjunctiva pink, lids and lashes normal. No nasal deformity, discharge, epistaxis.  Lips dry, teeth normal. OP tacky dry  Skin: Warm and dry.  No suspicious rashes or lesions. Cardiac: RRR, no murmurs appreciated.  No LE edema.    Respiratory: Normal respiratory rate and rhythm.  CTAB without rales or wheezes. Abdomen: Abdomen soft.  No focal TTP or guarding. No ascites, distension, hepatosplenomegaly.   MSK: No deformities or effusions of the large joints of  the upper or lower extremities bilaterally. Neuro: Awake and alert. Naming is grossly intact, and the patient's recall, recent and remote, as well as general fund of knowledge seem within normal limits.  Muscle tone normal, without fasciculations.  Moves all extremities equally and with normal coordination but generalized weakness.  Speech fluent.    Psych: Sensorium intact and responding to questions, attention nomal. Affect normal.  Judgment and insight appear normal.         Data Reviewed: I have personally reviewed following labs and imaging studies:  CBC: Recent Labs  Lab 07/01/19 1245 07/02/19 0353 07/03/19 0511  WBC 12.2* 8.7 8.4  NEUTROABS 7.6  --   --   HGB 13.2 11.1* 10.8*  HCT 35.3* 30.0* 30.0*  MCV 77.6* 79.8* 81.3  PLT 617* 491* 99991111*   Basic Metabolic Panel: Recent Labs  Lab 07/01/19 1117 07/01/19 1117 07/01/19 1245 07/01/19 1640 07/02/19 0353 07/02/19 0353 07/02/19 1119 07/03/19 0511 07/04/19 0538 07/05/19 0547 07/05/19 1421  NA 114*   < > 118*   < > 127*  --  133* 135 134* 138  --   K 2.2*   < > 2.1*   < > 2.9*   < > 3.0* 2.8* 3.2* 3.1* 3.5  CL 85*   < > 85*   < > 99  --  103 108 107 107  --   CO2 18*   < > 19*   < > 19*  --  21* 19* 21* 25  --   GLUCOSE 104*   < > 99   < > 133*  --  106* 115* 96 97  --   BUN 16   < > 16   < > 11  --  10 12 13 10   --   CREATININE 1.41*   < > 1.53*   < > 1.34*  --  1.31* 1.40* 1.22* 1.15*  --   CALCIUM 9.0   < > 9.4   < > 8.0*  --  8.5* 8.3* 8.8* 8.6*  --   MG 1.8  --  2.1  --   --   --   --   --   --   --   --   PHOS 2.7  --  3.0  --   --   --   --   --  1.4* 1.4* 1.5*   < > = values in this interval not displayed.   GFR: Estimated Creatinine Clearance: 88 mL/min (A) (by C-G formula based on SCr of 1.15 mg/dL (H)). Liver Function Tests: Recent Labs  Lab 07/01/19 1117 07/04/19 0538 07/05/19 0547  AST 22  --   --   ALT 19  --   --   ALKPHOS 56  --   --   BILITOT 0.4  --   --   PROT 7.3  --   --  ALBUMIN  3.9 3.2* 2.9*   No results for input(s): LIPASE, AMYLASE in the last 168 hours. No results for input(s): AMMONIA in the last 168 hours. Coagulation Profile: No results for input(s): INR, PROTIME in the last 168 hours. Cardiac Enzymes: No results for input(s): CKTOTAL, CKMB, CKMBINDEX, TROPONINI in the last 168 hours. BNP (last 3 results) No results for input(s): PROBNP in the last 8760 hours. HbA1C: No results for input(s): HGBA1C in the last 72 hours. CBG: No results for input(s): GLUCAP in the last 168 hours. Lipid Profile: No results for input(s): CHOL, HDL, LDLCALC, TRIG, CHOLHDL, LDLDIRECT in the last 72 hours. Thyroid Function Tests: No results for input(s): TSH, T4TOTAL, FREET4, T3FREE, THYROIDAB in the last 72 hours. Anemia Panel: No results for input(s): VITAMINB12, FOLATE, FERRITIN, TIBC, IRON, RETICCTPCT in the last 72 hours. Urine analysis:    Component Value Date/Time   COLORURINE STRAW (A) 06/11/2019 0801   APPEARANCEUR CLEAR (A) 06/11/2019 0801   LABSPEC 1.001 (L) 06/11/2019 0801   PHURINE 7.0 06/11/2019 0801   GLUCOSEU NEGATIVE 06/11/2019 0801   HGBUR NEGATIVE 06/11/2019 0801   BILIRUBINUR NEGATIVE 06/11/2019 0801   KETONESUR NEGATIVE 06/11/2019 0801   PROTEINUR NEGATIVE 06/11/2019 0801   NITRITE NEGATIVE 06/11/2019 0801   LEUKOCYTESUR NEGATIVE 06/11/2019 0801   Sepsis Labs: @LABRCNTIP (procalcitonin:4,lacticacidven:4)  ) Recent Results (from the past 240 hour(s))  SARS CORONAVIRUS 2 (TAT 6-24 HRS) Nasopharyngeal Nasopharyngeal Swab     Status: None   Collection Time: 07/01/19  3:05 PM   Specimen: Nasopharyngeal Swab  Result Value Ref Range Status   SARS Coronavirus 2 NEGATIVE NEGATIVE Final    Comment: (NOTE) SARS-CoV-2 target nucleic acids are NOT DETECTED. The SARS-CoV-2 RNA is generally detectable in upper and lower respiratory specimens during the acute phase of infection. Negative results do not preclude SARS-CoV-2 infection, do not rule  out co-infections with other pathogens, and should not be used as the sole basis for treatment or other patient management decisions. Negative results must be combined with clinical observations, patient history, and epidemiological information. The expected result is Negative. Fact Sheet for Patients: SugarRoll.be Fact Sheet for Healthcare Providers: https://www.woods-mathews.com/ This test is not yet approved or cleared by the [place] FDA and  has been authorized for detection and/or diagnosis of SARS-CoV-2 by FDA under an Emergency Use Authorization (EUA). This EUA will remain  in effect (meaning this test can be used) for the duration of the COVID-19 declaration under Section 56 4(b)(1) of the Act, 21 U.S.C. section 360bbb-3(b)(1), unless the authorization is terminated or revoked sooner. Performed at [facility], [place] [address] [place], [place] [address]   CULTURE, BLOOD (ROUTINE X 2) w Reflex to ID Panel     Status: None (Preliminary result)   Collection Time: 07/01/19  6:42 PM   Specimen: BLOOD  Result Value Ref Range Status   Specimen Description BLOOD RIGHT ANTECUBITAL  Final   Special Requests   Final    BOTTLES DRAWN AEROBIC AND ANAEROBIC Blood Culture adequate volume   Culture   Final    NO GROWTH 4 DAYS Performed at [facility], [address] [place], [place] [address]    Report Status PENDING  Incomplete  CULTURE, BLOOD (ROUTINE X 2) w Reflex to ID Panel     Status: None (Preliminary result)   Collection Time: 07/01/19  6:54 PM   Specimen: BLOOD  Result Value Ref Range Status   Specimen Description BLOOD BLOOD RIGHT HAND  Final  Special Requests   Final    BOTTLES DRAWN AEROBIC AND ANAEROBIC Blood Culture adequate volume   Culture   Final    NO GROWTH 4 DAYS Performed at [facility], [address] [place],  [address]    Report Status PENDING  Incomplete          Radiology Studies: No results found.      Scheduled Meds: . amitriptyline  75 mg Oral QHS  . atorvastatin  20 mg Oral Daily  . dicyclomine  20 mg Oral TID AC & HS  . DULoxetine  60 mg Oral Daily  . enoxaparin (LOVENOX) injection  40 mg Subcutaneous Q24H  . octreotide  100 mcg Subcutaneous Q8H  . potassium chloride  40 mEq Oral QHS  . potassium phosphate (monobasic)  500 mg Oral TID WC & HS  . sodium chloride flush  10-40 mL Intracatheter Q12H  . vitamin B-12  1,000 mcg Oral Daily   Continuous Infusions: . sodium chloride Stopped (07/05/19 0932)     LOS: 4 days    Time spent: 25 minutes    Edwin Dada, MD [facility] 07/05/2019, 7:51 PM     Please page though [place] or Epic secure chat:  For [employer], Adult nurse

## 2019-07-05 NOTE — Telephone Encounter (Signed)
Oral Oncology Patient Advocate Encounter  Peer to Peer was completed today by Dr Mike Gip and Dr Janeann Merl to review the prior authorization denial for Octreotide.  Prior auth denial has been overturned and medication is now approved.  Octreotide 257mcg (qty of 68ml's per 30 days) has been approved for 6 months.  (07/05/19-01/02/20)   Patient's copay is $238.76 per test claim.  [facility] [place] Phone [phone] Fax [phone] 07/05/2019 12:51 PM

## 2019-07-05 NOTE — Progress Notes (Signed)
[facility]  ROUNDING NOTE   Subjective:  Renal function slowly improving. Creatinine down to 1.15. Potassium still low at 3.1 despite aggressive repletion yesterday. Phosphorus also remains low at 1.4.   Objective:  Vital signs in last 24 hours:  Temp:  [97.7 F (36.5 C)-98.9 F (37.2 C)] 98.6 F (37 C) (02/16 1154) Pulse Rate:  [83-101] 94 (02/16 1154) Resp:  [16-17] 16 (02/16 1154) BP: (106-137)/(64-95) 110/68 (02/16 1154) SpO2:  [89 %-100 %] 89 % (02/16 1154)  Weight change:  Filed Weights   07/01/19 1240  Weight: 97.7 kg    Intake/Output: I/O last 3 completed shifts: In: 5586.7 [P.O.:240; I.V.:4895.6; IV Piggyback:451.1] Out: -    Intake/Output this shift:  Total I/O In: 387.4 [P.O.:130; I.V.:149.4; IV Piggyback:108] Out: -   Physical Exam: General: No acute distress  Head: Normocephalic, atraumatic. Moist oral mucosal membranes  Eyes: Anicteric  Neck: Supple, trachea midline  Lungs:  Clear to auscultation, normal effort  Heart: S1S2 no rubs  Abdomen:  Mild mid abdominal tenderness, no rebound  Extremities: No peripheral edema.  Neurologic: Awake, alert, following commands  Skin: No lesions  Access:     Basic Metabolic Panel: Recent Labs  Lab 07/01/19 1117 07/01/19 1117 07/01/19 1245 07/01/19 1640 07/02/19 0353 07/02/19 0353 07/02/19 1119 07/02/19 1119 07/03/19 0511 07/04/19 0538 07/05/19 0547  NA 114*   < > 118*   < > 127*  --  133*  --  135 134* 138  K 2.2*   < > 2.1*   < > 2.9*  --  3.0*  --  2.8* 3.2* 3.1*  CL 85*   < > 85*   < > 99  --  103  --  108 107 107  CO2 18*   < > 19*   < > 19*  --  21*  --  19* 21* 25  GLUCOSE 104*   < > 99   < > 133*  --  106*  --  115* 96 97  BUN 16   < > 16   < > 11  --  10  --  12 13 10   CREATININE 1.41*   < > 1.53*   < > 1.34*  --  1.31*  --  1.40* 1.22* 1.15*  CALCIUM 9.0   < > 9.4   < > 8.0*   < > 8.5*   < > 8.3* 8.8* 8.6*  MG 1.8  --  2.1  --   --   --   --   --   --   --   --   PHOS  2.7  --  3.0  --   --   --   --   --   --  1.4* 1.4*   < > = values in this interval not displayed.    Liver Function Tests: Recent Labs  Lab 07/01/19 1117 07/04/19 0538 07/05/19 0547  AST 22  --   --   ALT 19  --   --   ALKPHOS 56  --   --   BILITOT 0.4  --   --   PROT 7.3  --   --   ALBUMIN 3.9 3.2* 2.9*   No results for input(s): LIPASE, AMYLASE in the last 168 hours. No results for input(s): AMMONIA in the last 168 hours.  CBC: Recent Labs  Lab 07/01/19 1245 07/02/19 0353 07/03/19 0511  WBC 12.2* 8.7 8.4  NEUTROABS 7.6  --   --  HGB 13.2 11.1* 10.8*  HCT 35.3* 30.0* 30.0*  MCV 77.6* 79.8* 81.3  PLT 617* 491* 499*    Cardiac Enzymes: No results for input(s): CKTOTAL, CKMB, CKMBINDEX, TROPONINI in the last 168 hours.  BNP: Invalid input(s): POCBNP  CBG: No results for input(s): GLUCAP in the last 168 hours.  Microbiology: Results for orders placed or performed during the hospital encounter of 07/01/19  SARS CORONAVIRUS 2 (TAT 6-24 HRS) Nasopharyngeal Nasopharyngeal Swab     Status: None   Collection Time: 07/01/19  3:05 PM   Specimen: Nasopharyngeal Swab  Result Value Ref Range Status   SARS Coronavirus 2 NEGATIVE NEGATIVE Final    Comment: (NOTE) SARS-CoV-2 target nucleic acids are NOT DETECTED. The SARS-CoV-2 RNA is generally detectable in upper and lower respiratory specimens during the acute phase of infection. Negative results do not preclude SARS-CoV-2 infection, do not rule out co-infections with other pathogens, and should not be used as the sole basis for treatment or other patient management decisions. Negative results must be combined with clinical observations, patient history, and epidemiological information. The expected result is Negative. Fact Sheet for Patients: SugarRoll.be Fact Sheet for Healthcare Providers: https://www.woods-mathews.com/ This test is not yet approved or cleared by the  [place] FDA and  has been authorized for detection and/or diagnosis of SARS-CoV-2 by FDA under an Emergency Use Authorization (EUA). This EUA will remain  in effect (meaning this test can be used) for the duration of the COVID-19 declaration under Section 56 4(b)(1) of the Act, 21 U.S.C. section 360bbb-3(b)(1), unless the authorization is terminated or revoked sooner. Performed at [facility], [place] [address] [place], [place] [address]   CULTURE, BLOOD (ROUTINE X 2) w Reflex to ID Panel     Status: None (Preliminary result)   Collection Time: 07/01/19  6:42 PM   Specimen: BLOOD  Result Value Ref Range Status   Specimen Description BLOOD RIGHT ANTECUBITAL  Final   Special Requests   Final    BOTTLES DRAWN AEROBIC AND ANAEROBIC Blood Culture adequate volume   Culture   Final    NO GROWTH 4 DAYS Performed at [facility], [address] [place], [place] [address]    Report Status PENDING  Incomplete  CULTURE, BLOOD (ROUTINE X 2) w Reflex to ID Panel     Status: None (Preliminary result)   Collection Time: 07/01/19  6:54 PM   Specimen: BLOOD  Result Value Ref Range Status   Specimen Description BLOOD BLOOD RIGHT HAND  Final   Special Requests   Final    BOTTLES DRAWN AEROBIC AND ANAEROBIC Blood Culture adequate volume   Culture   Final    NO GROWTH 4 DAYS Performed at [facility], [place] [place],  [address]    Report Status PENDING  Incomplete    Coagulation Studies: No results for input(s): LABPROT, INR in the last 72 hours.  Urinalysis: No results for input(s): COLORURINE, LABSPEC, PHURINE, GLUCOSEU, HGBUR, BILIRUBINUR, KETONESUR, PROTEINUR, UROBILINOGEN, NITRITE, LEUKOCYTESUR in the last 72 hours.  Invalid input(s): APPERANCEUR    Imaging: No results found.   Medications:   . sodium chloride Stopped (07/05/19 0932)  . potassium chloride 10 mEq (07/05/19 1151)  . sodium phosphate  Dextrose 5% IVPB     .  amitriptyline  75 mg Oral QHS  . atorvastatin  20 mg Oral Daily  . dicyclomine  20 mg Oral TID AC & HS  . DULoxetine  60 mg Oral Daily  .  enoxaparin (LOVENOX) injection  40 mg Subcutaneous Q24H  . octreotide  100 mcg Subcutaneous Q8H  . potassium chloride  40 mEq Oral QHS  . potassium phosphate (monobasic)  500 mg Oral TID WC & HS  . sodium chloride flush  10-40 mL Intracatheter Q12H  . vitamin B-12  1,000 mcg Oral Daily   sodium chloride, acetaminophen, ALPRAZolam, diphenoxylate-atropine, gabapentin, hydrOXYzine, ondansetron (ZOFRAN) IV, oxyCODONE, promethazine **OR** promethazine **OR** promethazine, sodium chloride flush, traMADol  Assessment/ Plan:  34 y.o. female with a PMHx of chronic diarrhea, hypertension, hypokalemia, hyperlipidemia, anxiety, who was admitted to [facility] on 07/01/2019 for evaluation of diarrhea that has been chronic in nature along with nausea and abdominal pain.  She states that she has had significant diarrhea since April.   1.  Acute kidney injury likely secondary to diarrhea and prolonged volume depletion. 2.  Hypokalemia likely due to diarrhea. 3.  Metabolic acidosis 4.  Chronic diarrhea being followed closely by gastroenterology.  Plan: The patient's renal function is slowly improving.  Creatinine down to 1.15.  Potassium still low at 3.1 despite aggressive repletion both p.o. and intravenous yesterday.  She is receiving an additional round of potassium chloride of 40 mEq.  In addition we are administering sodium phosphate 20 mmol IV today.  Disposition as per Dr. Loleta Books.   LOS: 4 Latrell Potempa 2/16/202112:16 PM

## 2019-07-05 NOTE — Progress Notes (Signed)
Order received from Dr Holley Raring for IV sodium phosphate

## 2019-07-05 NOTE — TOC Initial Note (Signed)
Transition of Care [facility]) - Initial/Assessment Note    Patient Details  Name: Molly Cisneros MRN: [id number] Date of Birth: 1986/04/18  Transition of Care [facility]) CM/SW Contact:    Shelbie Ammons, RN Phone Number: 07/05/2019, 1:22 PM  Clinical Narrative:         RNCM assessment complete. Patient into hospital due to chronic diarrhea and ongoing metabolic issues. Patient normally independent, drives herself and lives with her parents. Her PCP is Dr. Army Melia and she gets her medications from [facility].  Patient verbalizes no other needs at this time.           Expected Discharge Plan: Home/Self Care     Patient Goals and CMS Choice        Expected Discharge Plan and Services Expected Discharge Plan: Home/Self Care       Living arrangements for the past 2 months: Single Family Home                                      Prior Living Arrangements/Services Living arrangements for the past 2 months: Single Family Home Lives with:: Parents   Do you feel safe going back to the place where you live?: Yes      Need for Family Participation in Patient Care: Yes (Comment) Care giver support system in place?: Yes (comment)   Criminal Activity/Legal Involvement Pertinent to Current Situation/Hospitalization: No - Comment as needed  Activities of Daily Living Home Assistive Devices/Equipment: None ADL Screening (condition at time of admission) Patient's cognitive ability adequate to safely complete daily activities?: Yes Is the patient deaf or have difficulty hearing?: No Does the patient have difficulty seeing, even when wearing glasses/contacts?: No Does the patient have difficulty concentrating, remembering, or making decisions?: No Patient able to express need for assistance with ADLs?: Yes Does the patient have difficulty dressing or bathing?: No Independently performs ADLs?: Yes (appropriate for developmental age) Does the patient have difficulty walking or  climbing stairs?: No Weakness of Legs: None Weakness of Arms/Hands: None  Permission Sought/Granted                  Emotional Assessment Appearance:: Appears stated age Attitude/Demeanor/Rapport: Engaged Affect (typically observed): Appropriate Orientation: : Oriented to Self, Oriented to Place, Oriented to  Time, Oriented to Situation      Admission diagnosis:  Dehydration [E86.0] Hyponatremia [E87.1] Chronic diarrhea [K52.9] Syncope, unspecified syncope type [R55] Patient Active Problem List   Diagnosis Date Noted  . Sepsis ([place]) 07/01/2019  . AKI (acute kidney injury) ([place]) 07/01/2019  . GERD (gastroesophageal reflux disease) 07/01/2019  . Depression with anxiety 07/01/2019  . Dehydration   . Folate deficiency 06/30/2019  . B12 deficiency 06/30/2019  . Orthostasis 06/21/2019  . Hypochloremia   . Hyponatremia 02/13/2019  . Hypokalemia 02/05/2019  . Chronic diarrhea of unknown origin   . Essential hypertension 11/03/2018  . Hyperlipidemia, mixed 11/03/2018  . Degeneration of C5-C6 intervertebral disc 08/31/2018  . Herniation of left side of L4-L5 intervertebral disc 06/05/2017  . Obesity (BMI 30-39.9) 06/05/2017  . Chronic left-sided low back pain without sciatica 07/23/2015   PCP:  Glean Hess, MD Pharmacy:   [facility], [place] [address] Phone: [phone] Fax: [phone]  [id number] - [place], [place] [address] Phone:  [phone] Fax: [phone]  [id number] - [place], [place] [address] Phone: [phone] Fax: [place], [place] [address] Phone: [phone] Fax: [phone]     Social Determinants of Health ([place]) Interventions    Readmission Risk Interventions Readmission Risk  Prevention Plan 07/05/2019 06/16/2019 06/14/2019  Transportation Screening Complete Complete -  PCP or Specialist Appt within 3-5 Days - Complete -  HRI or [place] - (No Data) -  Palliative Care Screening - Not Applicable Not Applicable  Medication Review (RN Care Manager) Complete Complete Complete  PCP or Specialist appointment within 3-5 days of discharge Complete - -  [place] or Home Care Consult Complete - -  Palliative Care Screening Not Applicable - -  [place] Not Applicable - -  Some recent data might be hidden

## 2019-07-06 ENCOUNTER — Encounter: Payer: Self-pay | Admitting: Gastroenterology

## 2019-07-06 LAB — RENAL FUNCTION PANEL
Albumin: 3.4 g/dL — ABNORMAL LOW (ref 3.5–5.0)
Anion gap: 12 (ref 5–15)
BUN: 11 mg/dL (ref 6–20)
CO2: 25 mmol/L (ref 22–32)
Calcium: 8.8 mg/dL — ABNORMAL LOW (ref 8.9–10.3)
Chloride: 101 mmol/L (ref 98–111)
Creatinine, Ser: 1.22 mg/dL — ABNORMAL HIGH (ref 0.44–1.00)
GFR calc Af Amer: >60 mL/min (ref >60)
GFR calc non Af Amer: 57 mL/min — ABNORMAL LOW (ref >60)
Glucose, Bld: 132 mg/dL — ABNORMAL HIGH (ref 70–99)
Phosphorus: 5.9 mg/dL — ABNORMAL HIGH (ref 2.5–4.6)
Potassium: 3.3 mmol/L — ABNORMAL LOW (ref 3.5–5.1)
Sodium: 138 mmol/L (ref 135–145)

## 2019-07-06 LAB — CULTURE, BLOOD (ROUTINE X 2)
Culture: NO GROWTH
Culture: NO GROWTH
Special Requests: ADEQUATE
Special Requests: ADEQUATE

## 2019-07-06 LAB — MAGNESIUM: Magnesium: 1.1 mg/dL — ABNORMAL LOW (ref 1.7–2.4)

## 2019-07-06 LAB — BASIC METABOLIC PANEL
Anion gap: 11 (ref 5–15)
BUN: 10 mg/dL (ref 6–20)
CO2: 22 mmol/L (ref 22–32)
Calcium: 8.7 mg/dL — ABNORMAL LOW (ref 8.9–10.3)
Chloride: 101 mmol/L (ref 98–111)
Creatinine, Ser: 1.15 mg/dL — ABNORMAL HIGH (ref 0.44–1.00)
GFR calc Af Amer: >60 mL/min (ref >60)
GFR calc non Af Amer: >60 mL/min (ref >60)
Glucose, Bld: 151 mg/dL — ABNORMAL HIGH (ref 70–99)
Potassium: 2.8 mmol/L — ABNORMAL LOW (ref 3.5–5.1)
Sodium: 134 mmol/L — ABNORMAL LOW (ref 135–145)

## 2019-07-06 LAB — URINALYSIS, COMPLETE (UACMP) WITH MICROSCOPIC
Bilirubin Urine: NEGATIVE
Glucose, UA: NEGATIVE mg/dL
Hgb urine dipstick: NEGATIVE
Ketones, ur: NEGATIVE mg/dL
Leukocytes,Ua: NEGATIVE
Nitrite: NEGATIVE
Protein, ur: NEGATIVE mg/dL
Specific Gravity, Urine: 1.009 (ref 1.005–1.030)
pH: 6 (ref 5.0–8.0)

## 2019-07-06 LAB — PHOSPHORUS: Phosphorus: 3.3 mg/dL (ref 2.5–4.6)

## 2019-07-06 MED ORDER — ENSURE ENLIVE PO LIQD
237.0000 mL | Freq: Two times a day (BID) | ORAL | Status: DC
  Administered 2019-07-06 – 2019-07-10 (×5): 237 mL via ORAL

## 2019-07-06 MED ORDER — POTASSIUM PHOSPHATES 15 MMOLE/5ML IV SOLN
30.0000 mmol | Freq: Once | INTRAVENOUS | Status: DC
  Administered 2019-07-06: 30 mmol via INTRAVENOUS
  Filled 2019-07-06: qty 10

## 2019-07-06 MED ORDER — FOLIC ACID 1 MG PO TABS
1.0000 mg | ORAL_TABLET | Freq: Every day | ORAL | Status: DC
  Administered 2019-07-07 – 2019-07-10 (×4): 1 mg via ORAL
  Filled 2019-07-06 (×4): qty 1

## 2019-07-06 MED ORDER — ADULT MULTIVITAMIN W/MINERALS CH
1.0000 | ORAL_TABLET | Freq: Every day | ORAL | Status: DC
  Administered 2019-07-07 – 2019-07-11 (×5): 1 via ORAL
  Filled 2019-07-06 (×5): qty 1

## 2019-07-06 MED ORDER — POTASSIUM CHLORIDE CRYS ER 20 MEQ PO TBCR
40.0000 meq | EXTENDED_RELEASE_TABLET | Freq: Two times a day (BID) | ORAL | Status: DC
  Administered 2019-07-06: 40 meq via ORAL
  Filled 2019-07-06: qty 2

## 2019-07-06 MED ORDER — B COMPLEX-C PO TABS
1.0000 | ORAL_TABLET | Freq: Every day | ORAL | Status: DC
  Administered 2019-07-06 – 2019-07-10 (×5): 1 via ORAL
  Filled 2019-07-06 (×6): qty 1

## 2019-07-06 MED FILL — OCTREOTIDE ACET 200 MCG/ML: 200 | 30 days supply | Qty: 45 | Fill #0

## 2019-07-06 MED FILL — BD TB SYRINGE 27GX1/2": 27G X 1/2" | 30 days supply | Qty: 90 | Fill #0

## 2019-07-06 MED FILL — BD TB SYRINGE 27GX1/2: 27G X 1/2" | 30 days supply | Qty: 90 | Fill #0
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
## 2019-07-06 NOTE — Progress Notes (Addendum)
PROGRESS NOTE    Molly Cisneros  [id number] DOB: [birth year] DOA: 07/01/2019 PCP: Glean Hess, MD    Brief Narrative:  Ms Molly Cisneros is a 34 y.o. F with chronic diarrhea, HTN, hx Cdiff and mood disorder who presented to her oncologist's office with few days worsening of her chronic abdominal pain and diarrhea and was sent to the ER.  In the ER, BP 89/60, patient pale and syncopized at the triage desk.  WBC 12K, K 2.1.  Na 118, Cr 1.53 (from baseline 0.9).   Assessment & Plan:  Severe chronic diarrhea causing severe symptomatic hypokalemia, hyponatremia, and acute kidney injury Patient admitted and started on octreotide, IV fluids, home dicyclomine.   Diarrhea 5 episodes yesterday, 3 episodes today small amount patient feels improvement -Continue octreotide -s/p  IV fluids with K, continue oral diet -s/p IV K phos, resolved, phosphorus normal -Monitor potassium daily and replete accordingly -Consult GI, appreciate expert advice -Continue dicyclomine, Lomotil  Hypokalemia Admitted with severe hypokalemia 2.1.  Mag normal. s/p K supplemented aggressively. K 2.1 > 2.9 > 3.1 > 2.8 > 3.2 > down to 3.3 today  Patient received K-Phos IV Monitor potassium daily and replete accordingly  Hyponatremia due to severe diarrhea Na 124=>138 resolved   Acute kidney injury Non-gap metabolic acidosis Cr 99991111 at admission (baseline 0.9).  Treated with fluids and resolved.  Likely hypovolemic injury. -s/p IV fluids, Cr 1.22 today -Consult Nephrology  Hypertension Chart history, not on meds.  BP stable, not hypotensive  Other medications -Continue amitriptyline -Continue atorvastatin -Continue duloxetine -Hold pantoprazole for now  Anemia This is likely all dilutional.  Mood disorder -Hold Adderall    Disposition: Patient still weak, diarrhea 3 episodes today, decreased appetite. We will continue to monitor electrolytes, repeat labs today evening if remains stable then possible  discharge tomorrow GI pathogen is still pending  If better, likely home tomorrow, as octreotide has been approved and ordered by Dr. Mike Gip from [place] after much diligent work and will be delivered from [employer] to the patient on Thursday at home.  At discharge, patient should get 1 or 2 of her daily doses of octreotide on Wednesday and then will have medicine available on Thursday, per my communcation with Dr. Mike Gip.   MDM: The below labs and imaging reports reviewed and summarized above.  Medication management as above.    DVT prophylaxis: Lovenox Code Status: FULL Family Communication: Dr. Barbette Reichmann at the bedside   Consultants:   GI  Nephrology  Procedures:     Antimicrobials:      Culture data:   2/12 GI pathogen panel still pending  2/12 blood cultures x2 NG   Subjective: No overnight issues, seen and examined at bedside. Patient still feels very weak and tired.  Patient had 3 episodes of diarrhea today morning, small amount seems to be improving.  Still nauseous.  Decreased appetite.   Objective: Vitals:   07/04/19 1937 07/05/19 1154 07/05/19 2150 07/06/19 0337  BP: 106/64 110/68 102/67 98/69  Pulse: 83 94 86 89  Resp: 17 16 16 16   Temp: 98.9 F (37.2 C) 98.6 F (37 C) 98.4 F (36.9 C) 98.4 F (36.9 C)  TempSrc: Oral Oral Oral Oral  SpO2: 99% (!) 89% 98% 98%  Weight:      Height:        Intake/Output Summary (Last 24 hours) at 07/06/2019 1508 Last data filed at 07/06/2019 0600 Gross per 24 hour  Intake 260 ml  Output --  Net 260 ml   Filed Weights   07/01/19 1240  Weight: 97.7 kg    Examination: General appearance:  adult female, alert and in no acute distress but appears sick and weak.   HEENT: Anicteric, conjunctiva pink, lids and lashes normal. No nasal deformity, discharge, epistaxis.  Lips dry, teeth normal. OP tacky dry  Skin: Warm and dry.  No suspicious rashes or lesions. Cardiac: RRR, no murmurs  appreciated.  No LE edema.    Respiratory: Normal respiratory rate and rhythm.  CTAB without rales or wheezes. Abdomen: Abdomen soft.  No focal TTP or guarding. No ascites, distension, hepatosplenomegaly.   MSK: No deformities or effusions of the large joints of the upper or lower extremities bilaterally. Neuro: Awake and alert. Naming is grossly intact, and the patient's recall, recent and remote, as well as general fund of knowledge seem within normal limits.  Muscle tone normal, without fasciculations.  Moves all extremities equally and with normal coordination but generalized weakness.  Speech fluent.    Psych: Sensorium intact and responding to questions, attention nomal. Affect normal.  Judgment and insight appear normal.    Data Reviewed: I have personally reviewed following labs and imaging studies:  CBC: Recent Labs  Lab 07/01/19 1245 07/02/19 0353 07/03/19 0511  WBC 12.2* 8.7 8.4  NEUTROABS 7.6  --   --   HGB 13.2 11.1* 10.8*  HCT 35.3* 30.0* 30.0*  MCV 77.6* 79.8* 81.3  PLT 617* 491* 99991111*   Basic Metabolic Panel: Recent Labs  Lab 07/01/19 1117 07/01/19 1117 07/01/19 1245 07/01/19 1640 07/02/19 1119 07/02/19 1119 07/03/19 0511 07/04/19 0538 07/05/19 0547 07/05/19 1421 07/06/19 0626  NA 114*   < > 118*   < > 133*  --  135 134* 138  --  138  K 2.2*   < > 2.1*   < > 3.0*   < > 2.8* 3.2* 3.1* 3.5 3.3*  CL 85*   < > 85*   < > 103  --  108 107 107  --  101  CO2 18*   < > 19*   < > 21*  --  19* 21* 25  --  25  GLUCOSE 104*   < > 99   < > 106*  --  115* 96 97  --  132*  BUN 16   < > 16   < > 10  --  12 13 10   --  11  CREATININE 1.41*   < > 1.53*   < > 1.31*  --  1.40* 1.22* 1.15*  --  1.22*  CALCIUM 9.0   < > 9.4   < > 8.5*  --  8.3* 8.8* 8.6*  --  8.8*  MG 1.8  --  2.1  --   --   --   --   --   --   --   --   PHOS 2.7   < > 3.0  --   --   --   --  1.4* 1.4* 1.5* 5.9*   < > = values in this interval not displayed.   GFR: Estimated Creatinine Clearance: 82.9 mL/min  (A) (by C-G formula based on SCr of 1.22 mg/dL (H)). Liver Function Tests: Recent Labs  Lab 07/01/19 1117 07/04/19 0538 07/05/19 0547 07/06/19 0626  AST 22  --   --   --   ALT 19  --   --   --   ALKPHOS 56  --   --   --  BILITOT 0.4  --   --   --   PROT 7.3  --   --   --   ALBUMIN 3.9 3.2* 2.9* 3.4*   No results for input(s): LIPASE, AMYLASE in the last 168 hours. No results for input(s): AMMONIA in the last 168 hours. Coagulation Profile: No results for input(s): INR, PROTIME in the last 168 hours. Cardiac Enzymes: No results for input(s): CKTOTAL, CKMB, CKMBINDEX, TROPONINI in the last 168 hours. BNP (last 3 results) No results for input(s): PROBNP in the last 8760 hours. HbA1C: No results for input(s): HGBA1C in the last 72 hours. CBG: No results for input(s): GLUCAP in the last 168 hours. Lipid Profile: No results for input(s): CHOL, HDL, LDLCALC, TRIG, CHOLHDL, LDLDIRECT in the last 72 hours. Thyroid Function Tests: No results for input(s): TSH, T4TOTAL, FREET4, T3FREE, THYROIDAB in the last 72 hours. Anemia Panel: No results for input(s): VITAMINB12, FOLATE, FERRITIN, TIBC, IRON, RETICCTPCT in the last 72 hours. Urine analysis:    Component Value Date/Time   COLORURINE YELLOW (A) 07/06/2019 1116   APPEARANCEUR HAZY (A) 07/06/2019 1116   LABSPEC 1.009 07/06/2019 1116   PHURINE 6.0 07/06/2019 1116   GLUCOSEU NEGATIVE 07/06/2019 1116   HGBUR NEGATIVE 07/06/2019 1116   [place] 07/06/2019 1116   [place] 07/06/2019 1116   PROTEINUR NEGATIVE 07/06/2019 1116   NITRITE NEGATIVE 07/06/2019 1116   LEUKOCYTESUR NEGATIVE 07/06/2019 1116   Sepsis Labs: @LABRCNTIP (procalcitonin:4,lacticacidven:4)  ) Recent Results (from the past 240 hour(s))  SARS CORONAVIRUS 2 (TAT 6-24 HRS) Nasopharyngeal Nasopharyngeal Swab     Status: None   Collection Time: 07/01/19  3:05 PM   Specimen: Nasopharyngeal Swab  Result Value Ref Range Status   SARS Coronavirus  2 NEGATIVE NEGATIVE Final    Comment: (NOTE) SARS-CoV-2 target nucleic acids are NOT DETECTED. The SARS-CoV-2 RNA is generally detectable in upper and lower respiratory specimens during the acute phase of infection. Negative results do not preclude SARS-CoV-2 infection, do not rule out co-infections with other pathogens, and should not be used as the sole basis for treatment or other patient management decisions. Negative results must be combined with clinical observations, patient history, and epidemiological information. The expected result is Negative. Fact Sheet for Patients: SugarRoll.be Fact Sheet for Healthcare Providers: https://www.woods-mathews.com/ This test is not yet approved or cleared by the [place] FDA and  has been authorized for detection and/or diagnosis of SARS-CoV-2 by FDA under an Emergency Use Authorization (EUA). This EUA will remain  in effect (meaning this test can be used) for the duration of the COVID-19 declaration under Section 56 4(b)(1) of the Act, 21 U.S.C. section 360bbb-3(b)(1), unless the authorization is terminated or revoked sooner. Performed at [facility], [place] [address], [place], [place] [address]   CULTURE, BLOOD (ROUTINE X 2) w Reflex to ID Panel     Status: None   Collection Time: 07/01/19  6:42 PM   Specimen: BLOOD  Result Value Ref Range Status   Specimen Description BLOOD RIGHT ANTECUBITAL  Final   Special Requests   Final    BOTTLES DRAWN AEROBIC AND ANAEROBIC Blood Culture adequate volume   Culture   Final    NO GROWTH 5 DAYS Performed at [facility], [address] [place], [place] [address]    Report Status 07/06/2019 FINAL  Final  CULTURE, BLOOD (ROUTINE X 2) w Reflex to ID Panel     Status: None   Collection Time: 07/01/19  6:54 PM  Specimen: BLOOD  Result Value Ref Range Status   Specimen Description BLOOD BLOOD RIGHT HAND  Final   Special Requests   Final     BOTTLES DRAWN AEROBIC AND ANAEROBIC Blood Culture adequate volume   Culture   Final    NO GROWTH 5 DAYS Performed at [facility], [address], [place], [place] [address]    Report Status 07/06/2019 FINAL  Final         Radiology Studies: No results found.      Scheduled Meds: . amitriptyline  75 mg Oral QHS  . atorvastatin  20 mg Oral Daily  . B-complex with vitamin C  1 tablet Oral Daily  . dicyclomine  20 mg Oral TID AC & HS  . DULoxetine  60 mg Oral Daily  . enoxaparin (LOVENOX) injection  40 mg Subcutaneous Q24H  . feeding supplement (ENSURE ENLIVE)  237 mL Oral BID BM  . [START ON A999333 folic acid  1 mg Oral Daily  . [START ON 07/07/2019] multivitamin with minerals  1 tablet Oral Daily  . octreotide  100 mcg Subcutaneous Q8H  . potassium phosphate (monobasic)  500 mg Oral TID WC & HS  . sodium chloride flush  10-40 mL Intracatheter Q12H  . vitamin B-12  1,000 mcg Oral Daily   Continuous Infusions: . sodium chloride Stopped (07/05/19 0932)  . potassium PHOSPHATE IVPB (in mmol) 30 mmol (07/06/19 0925)     LOS: 5 days    Time spent: 25 minutes    Val Riles, MD [facility] 07/06/2019, 3:08 PM

## 2019-07-06 NOTE — Progress Notes (Signed)
[facility]  ROUNDING NOTE   Subjective:  Patient did not eat very much yesterday. As a result her creatinine is up a bit to 1.2. Potassium still low at 3.3 despite aggressive IV repletion.   Objective:  Vital signs in last 24 hours:  Temp:  [98.4 F (36.9 C)] 98.4 F (36.9 C) (02/17 0337) Pulse Rate:  [86-89] 89 (02/17 0337) Resp:  [16] 16 (02/17 0337) BP: (98-102)/(67-69) 98/69 (02/17 0337) SpO2:  [98 %] 98 % (02/17 0337)  Weight change:  Filed Weights   07/01/19 1240  Weight: 97.7 kg    Intake/Output: I/O last 3 completed shifts: In: 2303 [P.O.:130; I.V.:1425.1; IV Piggyback:747.9] Out: -    Intake/Output this shift:  No intake/output data recorded.  Physical Exam: General: No acute distress  Head: Normocephalic, atraumatic. Moist oral mucosal membranes  Eyes: Anicteric  Neck: Supple, trachea midline  Lungs:  Clear to auscultation, normal effort  Heart: S1S2 no rubs  Abdomen:  Soft, nontender, nondistended, bowel sounds present  Extremities: No peripheral edema.  Neurologic: Awake, alert, following commands  Skin: No lesions       Basic Metabolic Panel: Recent Labs  Lab 07/01/19 1117 07/01/19 1117 07/01/19 1245 07/01/19 1640 07/02/19 1119 07/02/19 1119 07/03/19 0511 07/03/19 0511 07/04/19 0538 07/05/19 0547 07/05/19 1421 07/06/19 0626  NA 114*   < > 118*   < > 133*  --  135  --  134* 138  --  138  K 2.2*   < > 2.1*   < > 3.0*   < > 2.8*  --  3.2* 3.1* 3.5 3.3*  CL 85*   < > 85*   < > 103  --  108  --  107 107  --  101  CO2 18*   < > 19*   < > 21*  --  19*  --  21* 25  --  25  GLUCOSE 104*   < > 99   < > 106*  --  115*  --  96 97  --  132*  BUN 16   < > 16   < > 10  --  12  --  13 10  --  11  CREATININE 1.41*   < > 1.53*   < > 1.31*  --  1.40*  --  1.22* 1.15*  --  1.22*  CALCIUM 9.0   < > 9.4   < > 8.5*   < > 8.3*   < > 8.8* 8.6*  --  8.8*  MG 1.8  --  2.1  --   --   --   --   --   --   --   --   --   PHOS 2.7   < > 3.0  --   --    --   --   --  1.4* 1.4* 1.5* 5.9*   < > = values in this interval not displayed.    Liver Function Tests: Recent Labs  Lab 07/01/19 1117 07/04/19 0538 07/05/19 0547 07/06/19 0626  AST 22  --   --   --   ALT 19  --   --   --   ALKPHOS 56  --   --   --   BILITOT 0.4  --   --   --   PROT 7.3  --   --   --   ALBUMIN 3.9 3.2* 2.9* 3.4*   No results for input(s): LIPASE, AMYLASE in  the last 168 hours. No results for input(s): AMMONIA in the last 168 hours.  CBC: Recent Labs  Lab 07/01/19 1245 07/02/19 0353 07/03/19 0511  WBC 12.2* 8.7 8.4  NEUTROABS 7.6  --   --   HGB 13.2 11.1* 10.8*  HCT 35.3* 30.0* 30.0*  MCV 77.6* 79.8* 81.3  PLT 617* 491* 499*    Cardiac Enzymes: No results for input(s): CKTOTAL, CKMB, CKMBINDEX, TROPONINI in the last 168 hours.  BNP: Invalid input(s): POCBNP  CBG: No results for input(s): GLUCAP in the last 168 hours.  Microbiology: Results for orders placed or performed during the hospital encounter of 07/01/19  SARS CORONAVIRUS 2 (TAT 6-24 HRS) Nasopharyngeal Nasopharyngeal Swab     Status: None   Collection Time: 07/01/19  3:05 PM   Specimen: Nasopharyngeal Swab  Result Value Ref Range Status   SARS Coronavirus 2 NEGATIVE NEGATIVE Final    Comment: (NOTE) SARS-CoV-2 target nucleic acids are NOT DETECTED. The SARS-CoV-2 RNA is generally detectable in upper and lower respiratory specimens during the acute phase of infection. Negative results do not preclude SARS-CoV-2 infection, do not rule out co-infections with other pathogens, and should not be used as the sole basis for treatment or other patient management decisions. Negative results must be combined with clinical observations, patient history, and epidemiological information. The expected result is Negative. Fact Sheet for Patients: SugarRoll.be Fact Sheet for Healthcare Providers: https://www.woods-mathews.com/ This test is not yet  approved or cleared by the [place] FDA and  has been authorized for detection and/or diagnosis of SARS-CoV-2 by FDA under an Emergency Use Authorization (EUA). This EUA will remain  in effect (meaning this test can be used) for the duration of the COVID-19 declaration under Section 56 4(b)(1) of the Act, 21 U.S.C. section 360bbb-3(b)(1), unless the authorization is terminated or revoked sooner. Performed at [facility], [place] [address], [place], [place] [address]   CULTURE, BLOOD (ROUTINE X 2) w Reflex to ID Panel     Status: None   Collection Time: 07/01/19  6:42 PM   Specimen: BLOOD  Result Value Ref Range Status   Specimen Description BLOOD RIGHT ANTECUBITAL  Final   Special Requests   Final    BOTTLES DRAWN AEROBIC AND ANAEROBIC Blood Culture adequate volume   Culture   Final    NO GROWTH 5 DAYS Performed at [facility], [address] [place], [place] [address]    Report Status 07/06/2019 FINAL  Final  CULTURE, BLOOD (ROUTINE X 2) w Reflex to ID Panel     Status: None   Collection Time: 07/01/19  6:54 PM   Specimen: BLOOD  Result Value Ref Range Status   Specimen Description BLOOD BLOOD RIGHT HAND  Final   Special Requests   Final    BOTTLES DRAWN AEROBIC AND ANAEROBIC Blood Culture adequate volume   Culture   Final    NO GROWTH 5 DAYS Performed at [facility], [address] [place], [place] [address]    Report Status 07/06/2019 FINAL  Final    Coagulation Studies: No results for input(s): LABPROT, INR in the last 72 hours.  Urinalysis: Recent Labs    07/06/19 1116  COLORURINE YELLOW*  LABSPEC 1.009  PHURINE 6.0  GLUCOSEU NEGATIVE  HGBUR NEGATIVE  BILIRUBINUR NEGATIVE  KETONESUR NEGATIVE  PROTEINUR NEGATIVE  NITRITE NEGATIVE  LEUKOCYTESUR NEGATIVE      Imaging: No results found.   Medications:   . sodium chloride Stopped (07/05/19 0932)   .  amitriptyline  75 mg Oral QHS  . atorvastatin  20 mg Oral Daily  .  B-complex with vitamin C  1 tablet Oral Daily  . dicyclomine  20 mg Oral TID AC & HS  . DULoxetine  60 mg Oral Daily  . enoxaparin (LOVENOX) injection  40 mg Subcutaneous Q24H  . feeding supplement (ENSURE ENLIVE)  237 mL Oral BID BM  . [START ON A999333 folic acid  1 mg Oral Daily  . [START ON 07/07/2019] multivitamin with minerals  1 tablet Oral Daily  . octreotide  100 mcg Subcutaneous Q8H  . sodium chloride flush  10-40 mL Intracatheter Q12H  . vitamin B-12  1,000 mcg Oral Daily   sodium chloride, acetaminophen, ALPRAZolam, diphenoxylate-atropine, gabapentin, hydrOXYzine, ondansetron (ZOFRAN) IV, oxyCODONE, promethazine **OR** promethazine **OR** promethazine, sodium chloride flush, traMADol  Assessment/ Plan:  34 y.o. female with a PMHx of chronic diarrhea, hypertension, hypokalemia, hyperlipidemia, anxiety, who was admitted to [facility] on 07/01/2019 for evaluation of diarrhea that has been chronic in nature along with nausea and abdominal pain.  She states that she has had significant diarrhea since April.   1.  Acute kidney injury likely secondary to diarrhea and prolonged volume depletion. 2.  Hypokalemia likely due to diarrhea. 3.  Metabolic acidosis 4.  Chronic diarrhea being followed closely by gastroenterology.  Plan: The patient seen and evaluated bedside.  Creatinine did trend up slightly to 1.2.  However she did not eat very much yesterday.  Potassium still low at 3.3.  We counseled the patient on foods that are high in potassium.  Patient indicated that she would be ordering some of these foods from dietary.  She has undergone aggressive IV electrolyte repletion.  Repeat renal parameters tomorrow as well as serum electrolytes.  Further plan as patient progresses.   LOS: 5 Montee Tallman 2/17/20213:41 PM

## 2019-07-07 LAB — GASTROINTESTINAL PANEL BY PCR, STOOL (REPLACES STOOL CULTURE)

## 2019-07-07 LAB — CBC
HCT: 33.4 % — ABNORMAL LOW (ref 36.0–46.0)
Hemoglobin: 11.6 g/dL — ABNORMAL LOW (ref 12.0–15.0)
MCH: 29 pg (ref 26.0–34.0)
MCHC: 34.7 g/dL (ref 30.0–36.0)
MCV: 83.5 fL (ref 80.0–100.0)
Platelets: 522 10*3/uL — ABNORMAL HIGH (ref 150–400)
RBC: 4 MIL/uL (ref 3.87–5.11)
RDW: 13.2 % (ref 11.5–15.5)
WBC: 13.8 10*3/uL — ABNORMAL HIGH (ref 4.0–10.5)
nRBC: 0 % (ref 0.0–0.2)

## 2019-07-07 LAB — BASIC METABOLIC PANEL
Anion gap: 10 (ref 5–15)
BUN: 13 mg/dL (ref 6–20)
CO2: 24 mmol/L (ref 22–32)
Calcium: 8.8 mg/dL — ABNORMAL LOW (ref 8.9–10.3)
Chloride: 102 mmol/L (ref 98–111)
Creatinine, Ser: 1.37 mg/dL — ABNORMAL HIGH (ref 0.44–1.00)
GFR calc Af Amer: 58 mL/min — ABNORMAL LOW (ref >60)
GFR calc non Af Amer: 50 mL/min — ABNORMAL LOW (ref >60)
Glucose, Bld: 98 mg/dL (ref 70–99)
Potassium: 3.2 mmol/L — ABNORMAL LOW (ref 3.5–5.1)
Sodium: 136 mmol/L (ref 135–145)

## 2019-07-07 LAB — C DIFFICILE QUICK SCREEN W PCR REFLEX
C Diff antigen: POSITIVE — AB
C Diff toxin: NEGATIVE

## 2019-07-07 LAB — VITAMIN D 25 HYDROXY (VIT D DEFICIENCY, FRACTURES): Vit D, 25-Hydroxy: 8.43 ng/mL — ABNORMAL LOW (ref 30–100)

## 2019-07-07 LAB — MAGNESIUM: Magnesium: 1.4 mg/dL — ABNORMAL LOW (ref 1.7–2.4)

## 2019-07-07 LAB — CLOSTRIDIUM DIFFICILE BY PCR, REFLEXED: Toxigenic C. Difficile by PCR: NEGATIVE

## 2019-07-07 LAB — PHOSPHORUS: Phosphorus: 4.1 mg/dL (ref 2.5–4.6)

## 2019-07-07 MED ORDER — VITAMIN D 25 MCG (1000 UNIT) PO TABS: 1000.0000 [IU] | ORAL_TABLET | Freq: Every day | ORAL | Status: DC

## 2019-07-07 MED ORDER — POTASSIUM CHLORIDE CRYS ER 20 MEQ PO TBCR
40.0000 meq | EXTENDED_RELEASE_TABLET | ORAL | Status: AC
  Administered 2019-07-07 (×2): 40 meq via ORAL
  Filled 2019-07-07 (×2): qty 2

## 2019-07-07 MED ORDER — MAGNESIUM SULFATE 2 GM/50ML IV SOLN
2.0000 g | INTRAVENOUS | Status: AC
  Administered 2019-07-07: 2 g via INTRAVENOUS
  Filled 2019-07-07 (×2): qty 50

## 2019-07-07 MED ORDER — PANTOPRAZOLE SODIUM 40 MG PO TBEC
40.0000 mg | DELAYED_RELEASE_TABLET | Freq: Every day | ORAL | Status: DC
  Administered 2019-07-07 – 2019-07-11 (×5): 40 mg via ORAL
  Filled 2019-07-07 (×5): qty 1

## 2019-07-07 MED ORDER — VITAMIN D (ERGOCALCIFEROL) 1.25 MG (50000 UNIT) PO CAPS
50000.0000 [IU] | ORAL_CAPSULE | ORAL | Status: DC
  Administered 2019-07-07: 50000 [IU] via ORAL
  Filled 2019-07-07: qty 1

## 2019-07-07 MED ORDER — SODIUM CHLORIDE 0.9 % IV SOLN: INTRAVENOUS | Status: DC

## 2019-07-07 MED ORDER — SODIUM CHLORIDE 0.9 % IV SOLN: INTRAVENOUS | Status: AC

## 2019-07-07 MED ORDER — POTASSIUM CHLORIDE 10 MEQ/100ML IV SOLN
10.0000 meq | INTRAVENOUS | Status: DC
  Filled 2019-07-07 (×2): qty 100

## 2019-07-07 MED ORDER — POTASSIUM CHLORIDE 10 MEQ/100ML IV SOLN
10.0000 meq | INTRAVENOUS | Status: AC
  Administered 2019-07-07: 10 meq via INTRAVENOUS

## 2019-07-07 MED ORDER — POTASSIUM CHLORIDE 10 MEQ/100ML IV SOLN
10.0000 meq | INTRAVENOUS | Status: AC
  Administered 2019-07-07 (×3): 10 meq via INTRAVENOUS
  Filled 2019-07-07 (×2): qty 100

## 2019-07-07 MED ORDER — ASPIRIN EC 81 MG PO TBEC
81.0000 mg | DELAYED_RELEASE_TABLET | Freq: Every day | ORAL | Status: DC
  Administered 2019-07-07 – 2019-07-11 (×5): 81 mg via ORAL
  Filled 2019-07-07 (×5): qty 1

## 2019-07-07 MED ORDER — MAGNESIUM SULFATE 2 GM/50ML IV SOLN
2.0000 g | Freq: Once | INTRAVENOUS | Status: AC
  Administered 2019-07-07: 16:00:00 2 g via INTRAVENOUS

## 2019-07-07 NOTE — Progress Notes (Signed)
PROGRESS NOTE    Molly Cisneros  [id number] DOB: 09-May-1986 DOA: 07/01/2019 PCP: Glean Hess, MD    Brief Narrative:  Molly Cisneros is a 34 y.o. F with chronic diarrhea, HTN, hx Cdiff and mood disorder who presented to her oncologist's office with few days worsening of her chronic abdominal pain and diarrhea and was sent to the ER.  In the ER, BP 89/60, patient pale and syncopized at the triage desk.  WBC 12K, K 2.1.  Na 118, Cr 1.53 (from baseline 0.9).   Assessment & Plan:  Severe chronic diarrhea causing severe symptomatic hypokalemia, hyponatremia, and acute kidney injury Patient admitted and started on octreotide, IV fluids, home dicyclomine.   Diarrhea 5 episodes yesterday, 3 episodes today small amount patient feels improvement -Continue octreotide -continue  IV fluids 75 ml/hr and IV K and po K given today.  continue oral diet -s/p IV K phos, resolved, phosphorus normal -Monitor potassium daily and replete accordingly -Consult GI, appreciate expert advice -Continue dicyclomine, Lomotil  Hypokalemia Admitted with severe hypokalemia 2.1.  Mag normal. s/p K supplemented aggressively. K 2.1 > 2.9 > 3.1 > 2.8 > 3.2 > 3.3 > 3.2 today  Patient received K po and IV as per nephro Monitor potassium daily and replete accordingly  Hyponatremia due to severe diarrhea Na 124=>138 >134>136 resolved   Hypomagnesemia, magnesium repleted IV.  Continue to monitor and replete as needed.  Acute kidney injury Non-gap metabolic acidosis Cr [id number] at admission (baseline 0.9).  Treated with fluids and resolved.  Likely hypovolemic injury. -Continue IV fluids, Cr 1.22 >1.37 today -Consult Nephrology  Hypertension Chart history, not on meds.  BP stable, not hypotensive  Other medications -Continue amitriptyline -Continue atorvastatin -Continue duloxetine -Hold pantoprazole for now  Anemia This is likely all dilutional.  Mood disorder -Hold Adderall   Thrombocytosis, elevated  platelet count, recommend to follow with hematologist as an outpatient for further work-up Started aspirin 81 mg p.o. daily and PPI for GI prophylaxis   Disposition: Patient still weak, diarrhea 6 episodes yesterday and 2 episodes today, decreased appetite.  Still has low potassium and magnesium which is being repleted IV.   We will continue to monitor electrolytes, and replete.  Nephrology following plan to discharge in 1 to 2 days.  GI pathogen was pending, insufficient sample so RN will send again.    If better, likely home tomorrow, as octreotide has been approved and ordered by Dr. Mike Gip from [place] after much diligent work and will be delivered from [facility] to the patient on Thursday at home.   MDM: The below labs and imaging reports reviewed and summarized above.  Medication management as above.    DVT prophylaxis: Lovenox Code Status: FULL Family Communication: No one available at bedside, previous provider had discussion with Dr. Barbette Reichmann at the bedside   Consultants:   GI  Nephrology  Procedures:     Antimicrobials:      Culture data:   2/12 GI pathogen panel still pending (insufficient sample 2/18)  2/12 blood cultures x2 NG  2/18 GI pathogen and C. diff   Subjective: No overnight issues, pt was seen and examined at bedside. Patient had 6 episodes of diarrhea yesterday, 2 episodes today morning.  Patient feels improvement in the volume of diarrhea.  Still has decreased appetite and feels tired.     Objective: Vitals:   07/05/19 2150 07/06/19 0337 07/06/19 2111 07/07/19 0501  BP: 102/67 98/69 111/69 97/63  Pulse: 86 89 97 (!)  107  Resp: 16 16 16 16   Temp: 98.4 F (36.9 C) 98.4 F (36.9 C) 98.1 F (36.7 C) 98.6 F (37 C)  TempSrc: Oral Oral Oral Oral  SpO2: 98% 98% 97% 97%  Weight:      Height:       No intake or output data in the 24 hours ending 07/07/19 1250 Filed Weights   07/01/19 1240  Weight: 97.7 kg     Examination: General appearance:  adult female, alert and in no acute distress but appears sick and weak.   HEENT: Anicteric, conjunctiva pink, lids and lashes normal. No nasal deformity, discharge, epistaxis.  Lips dry, teeth normal. OP tacky dry  Skin: Warm and dry.  No suspicious rashes or lesions. Cardiac: RRR, no murmurs appreciated.  No LE edema.    Respiratory: Normal respiratory rate and rhythm.  CTAB without rales or wheezes. Abdomen: Abdomen soft.  No focal TTP or guarding. No ascites, distension, hepatosplenomegaly.   MSK: No deformities or effusions of the large joints of the upper or lower extremities bilaterally. Neuro: Awake and alert. Naming is grossly intact, and the patient's recall, recent and remote, as well as general fund of knowledge seem within normal limits.  Muscle tone normal, without fasciculations.  Moves all extremities equally and with normal coordination but generalized weakness.  Speech fluent.    Psych: Sensorium intact and responding to questions, attention nomal. Affect normal.  Judgment and insight appear normal.    Data Reviewed: I have personally reviewed following labs and imaging studies:  CBC: Recent Labs  Lab 07/01/19 1245 07/02/19 0353 07/03/19 0511 07/07/19 0601  WBC 12.2* 8.7 8.4 13.8*  NEUTROABS 7.6  --   --   --   HGB 13.2 11.1* 10.8* 11.6*  HCT 35.3* 30.0* 30.0* 33.4*  MCV 77.6* 79.8* 81.3 83.5  PLT 617* 491* 499* [id number]*   Basic Metabolic Panel: Recent Labs  Lab 07/01/19 1117 07/01/19 1117 07/01/19 1245 07/01/19 1640 07/04/19 0538 07/04/19 0538 07/05/19 0547 07/05/19 1421 07/06/19 0626 07/06/19 1819 07/07/19 0601  NA 114*   < > 118*   < > 134*  --  138  --  138 134* 136  K 2.2*   < > 2.1*   < > 3.2*   < > 3.1* 3.5 3.3* 2.8* 3.2*  CL 85*   < > 85*   < > 107  --  107  --  101 101 102  CO2 18*   < > 19*   < > 21*  --  25  --  25 22 24   GLUCOSE 104*   < > 99   < > 96  --  97  --  132* 151* 98  BUN 16   < > 16   < > 13  --   10  --  11 10 13   CREATININE 1.41*   < > 1.53*   < > 1.22*  --  1.15*  --  1.22* 1.15* 1.37*  CALCIUM 9.0   < > 9.4   < > 8.8*  --  8.6*  --  8.8* 8.7* 8.8*  MG 1.8  --  2.1  --   --   --   --   --   --  1.1* 1.4*  PHOS 2.7   < > 3.0   < > 1.4*   < > 1.4* 1.5* 5.9* 3.3 4.1   < > = values in this interval not displayed.   GFR: Estimated Creatinine  Clearance: 73.8 mL/min (A) (by C-G formula based on SCr of 1.37 mg/dL (H)). Liver Function Tests: Recent Labs  Lab 07/01/19 1117 07/04/19 0538 07/05/19 0547 07/06/19 0626  AST 22  --   --   --   ALT 19  --   --   --   ALKPHOS 56  --   --   --   BILITOT 0.4  --   --   --   PROT 7.3  --   --   --   ALBUMIN 3.9 3.2* 2.9* 3.4*   No results for input(s): LIPASE, AMYLASE in the last 168 hours. No results for input(s): AMMONIA in the last 168 hours. Coagulation Profile: No results for input(s): INR, PROTIME in the last 168 hours. Cardiac Enzymes: No results for input(s): CKTOTAL, CKMB, CKMBINDEX, TROPONINI in the last 168 hours. BNP (last 3 results) No results for input(s): PROBNP in the last 8760 hours. HbA1C: No results for input(s): HGBA1C in the last 72 hours. CBG: No results for input(s): GLUCAP in the last 168 hours. Lipid Profile: No results for input(s): CHOL, HDL, LDLCALC, TRIG, CHOLHDL, LDLDIRECT in the last 72 hours. Thyroid Function Tests: No results for input(s): TSH, T4TOTAL, FREET4, T3FREE, THYROIDAB in the last 72 hours. Anemia Panel: No results for input(s): VITAMINB12, FOLATE, FERRITIN, TIBC, IRON, RETICCTPCT in the last 72 hours. Urine analysis:    Component Value Date/Time   COLORURINE YELLOW (A) 07/06/2019 1116   APPEARANCEUR HAZY (A) 07/06/2019 1116   LABSPEC 1.009 07/06/2019 1116   PHURINE 6.0 07/06/2019 1116   GLUCOSEU NEGATIVE 07/06/2019 1116   HGBUR NEGATIVE 07/06/2019 1116   [place] 07/06/2019 1116   [place] 07/06/2019 1116   PROTEINUR NEGATIVE 07/06/2019 1116   NITRITE  NEGATIVE 07/06/2019 1116   LEUKOCYTESUR NEGATIVE 07/06/2019 1116   Sepsis Labs: @LABRCNTIP (procalcitonin:4,lacticacidven:4)  ) Recent Results (from the past 240 hour(s))  SARS CORONAVIRUS 2 (TAT 6-24 HRS) Nasopharyngeal Nasopharyngeal Swab     Status: None   Collection Time: 07/01/19  3:05 PM   Specimen: Nasopharyngeal Swab  Result Value Ref Range Status   SARS Coronavirus 2 NEGATIVE NEGATIVE Final    Comment: (NOTE) SARS-CoV-2 target nucleic acids are NOT DETECTED. The SARS-CoV-2 RNA is generally detectable in upper and lower respiratory specimens during the acute phase of infection. Negative results do not preclude SARS-CoV-2 infection, do not rule out co-infections with other pathogens, and should not be used as the sole basis for treatment or other patient management decisions. Negative results must be combined with clinical observations, patient history, and epidemiological information. The expected result is Negative. Fact Sheet for Patients: SugarRoll.be Fact Sheet for Healthcare Providers: https://www.woods-mathews.com/ This test is not yet approved or cleared by the [place] FDA and  has been authorized for detection and/or diagnosis of SARS-CoV-2 by FDA under an Emergency Use Authorization (EUA). This EUA will remain  in effect (meaning this test can be used) for the duration of the COVID-19 declaration under Section 56 4(b)(1) of the Act, 21 U.S.C. section 360bbb-3(b)(1), unless the authorization is terminated or revoked sooner. Performed at [facility], [place] [address] [place],  [address]   CULTURE, BLOOD (ROUTINE X 2) w Reflex to ID Panel     Status: None   Collection Time: 07/01/19  6:42 PM   Specimen: BLOOD  Result Value Ref Range Status   Specimen Description BLOOD RIGHT ANTECUBITAL  Final   Special Requests   Final    BOTTLES DRAWN AEROBIC AND  ANAEROBIC Blood Culture adequate volume   Culture    Final    NO GROWTH 5 DAYS Performed at [facility], [address] [place], [place] [address]    Report Status 07/06/2019 FINAL  Final  CULTURE, BLOOD (ROUTINE X 2) w Reflex to ID Panel     Status: None   Collection Time: 07/01/19  6:54 PM   Specimen: BLOOD  Result Value Ref Range Status   Specimen Description BLOOD BLOOD RIGHT HAND  Final   Special Requests   Final    BOTTLES DRAWN AEROBIC AND ANAEROBIC Blood Culture adequate volume   Culture   Final    NO GROWTH 5 DAYS Performed at [facility], [address] [place], [place] [address]    Report Status 07/06/2019 FINAL  Final         Radiology Studies: No results found.      Scheduled Meds: . amitriptyline  75 mg Oral QHS  . atorvastatin  20 mg Oral Daily  . B-complex with vitamin C  1 tablet Oral Daily  . dicyclomine  20 mg Oral TID AC & HS  . DULoxetine  60 mg Oral Daily  . enoxaparin (LOVENOX) injection  40 mg Subcutaneous Q24H  . feeding supplement (ENSURE ENLIVE)  237 mL Oral BID BM  . folic acid  1 mg Oral Daily  . multivitamin with minerals  1 tablet Oral Daily  . octreotide  100 mcg Subcutaneous Q8H  . sodium chloride flush  10-40 mL Intracatheter Q12H  . vitamin B-12  1,000 mcg Oral Daily   Continuous Infusions: . sodium chloride Stopped (07/05/19 0932)  . sodium chloride    . potassium chloride       LOS: 6 days    Time spent: 25 minutes    Val Riles, MD [facility] 07/07/2019, 12:50 PM

## 2019-07-07 NOTE — Progress Notes (Signed)
[facility]  ROUNDING NOTE   Subjective:  Patient seen at bedside. Creatinine was up to 1.37 today. Potassium currently 3.2.   Objective:  Vital signs in last 24 hours:  Temp:  [98.1 F (36.7 C)-98.6 F (37 C)] 98.1 F (36.7 C) (02/18 1317) Pulse Rate:  [97-107] 98 (02/18 1317) Resp:  [16-18] 18 (02/18 1317) BP: (97-111)/(63-69) 99/68 (02/18 1317) SpO2:  [97 %-98 %] 98 % (02/18 1317)  Weight change:  Filed Weights   07/01/19 1240  Weight: 97.7 kg    Intake/Output: I/O last 3 completed shifts: In: 260 [IV Piggyback:260] Out: -    Intake/Output this shift:  No intake/output data recorded.  Physical Exam: General: No acute distress  Head: Normocephalic, atraumatic. Moist oral mucosal membranes  Eyes: Anicteric  Neck: Supple, trachea midline  Lungs:  Clear to auscultation, normal effort  Heart: S1S2 no rubs  Abdomen:  Soft, nontender, nondistended, bowel sounds present  Extremities: No peripheral edema.  Neurologic: Awake, alert, following commands  Skin: No lesions       Basic Metabolic Panel: Recent Labs  Lab 07/01/19 1117 07/01/19 1117 07/01/19 1245 07/01/19 1640 07/04/19 0538 07/04/19 0538 07/05/19 0547 07/05/19 0547 07/05/19 1421 07/06/19 0626 07/06/19 1819 07/07/19 0601  NA 114*   < > 118*   < > 134*  --  138  --   --  138 134* 136  K 2.2*   < > 2.1*   < > 3.2*   < > 3.1*  --  3.5 3.3* 2.8* 3.2*  CL 85*   < > 85*   < > 107  --  107  --   --  101 101 102  CO2 18*   < > 19*   < > 21*  --  25  --   --  25 22 24   GLUCOSE 104*   < > 99   < > 96  --  97  --   --  132* 151* 98  BUN 16   < > 16   < > 13  --  10  --   --  11 10 13   CREATININE 1.41*   < > 1.53*   < > 1.22*  --  1.15*  --   --  1.22* 1.15* 1.37*  CALCIUM 9.0   < > 9.4   < > 8.8*   < > 8.6*   < >  --  8.8* 8.7* 8.8*  MG 1.8  --  2.1  --   --   --   --   --   --   --  1.1* 1.4*  PHOS 2.7   < > 3.0   < > 1.4*   < > 1.4*  --  1.5* 5.9* 3.3 4.1   < > = values in this interval  not displayed.    Liver Function Tests: Recent Labs  Lab 07/01/19 1117 07/04/19 0538 07/05/19 0547 07/06/19 0626  AST 22  --   --   --   ALT 19  --   --   --   ALKPHOS 56  --   --   --   BILITOT 0.4  --   --   --   PROT 7.3  --   --   --   ALBUMIN 3.9 3.2* 2.9* 3.4*   No results for input(s): LIPASE, AMYLASE in the last 168 hours. No results for input(s): AMMONIA in the last 168 hours.  CBC: Recent Labs  Lab 07/01/19 1245 07/02/19 0353 07/03/19 0511 07/07/19 0601  WBC 12.2* 8.7 8.4 13.8*  NEUTROABS 7.6  --   --   --   HGB 13.2 11.1* 10.8* 11.6*  HCT 35.3* 30.0* 30.0* 33.4*  MCV 77.6* 79.8* 81.3 83.5  PLT 617* 491* 499* 522*    Cardiac Enzymes: No results for input(s): CKTOTAL, CKMB, CKMBINDEX, TROPONINI in the last 168 hours.  BNP: Invalid input(s): POCBNP  CBG: No results for input(s): GLUCAP in the last 168 hours.  Microbiology: Results for orders placed or performed during the hospital encounter of 07/01/19  SARS CORONAVIRUS 2 (TAT 6-24 HRS) Nasopharyngeal Nasopharyngeal Swab     Status: None   Collection Time: 07/01/19  3:05 PM   Specimen: Nasopharyngeal Swab  Result Value Ref Range Status   SARS Coronavirus 2 NEGATIVE NEGATIVE Final    Comment: (NOTE) SARS-CoV-2 target nucleic acids are NOT DETECTED. The SARS-CoV-2 RNA is generally detectable in upper and lower respiratory specimens during the acute phase of infection. Negative results do not preclude SARS-CoV-2 infection, do not rule out co-infections with other pathogens, and should not be used as the sole basis for treatment or other patient management decisions. Negative results must be combined with clinical observations, patient history, and epidemiological information. The expected result is Negative. Fact Sheet for Patients: SugarRoll.be Fact Sheet for Healthcare Providers: https://www.woods-mathews.com/ This test is not yet approved or cleared by  the [place] FDA and  has been authorized for detection and/or diagnosis of SARS-CoV-2 by FDA under an Emergency Use Authorization (EUA). This EUA will remain  in effect (meaning this test can be used) for the duration of the COVID-19 declaration under Section 56 4(b)(1) of the Act, 21 U.S.C. section 360bbb-3(b)(1), unless the authorization is terminated or revoked sooner. Performed at [facility], [place] [address], [place], [place] [address]   CULTURE, BLOOD (ROUTINE X 2) w Reflex to ID Panel     Status: None   Collection Time: 07/01/19  6:42 PM   Specimen: BLOOD  Result Value Ref Range Status   Specimen Description BLOOD RIGHT ANTECUBITAL  Final   Special Requests   Final    BOTTLES DRAWN AEROBIC AND ANAEROBIC Blood Culture adequate volume   Culture   Final    NO GROWTH 5 DAYS Performed at [facility], [address] [place], [place] [address]    Report Status 07/06/2019 FINAL  Final  CULTURE, BLOOD (ROUTINE X 2) w Reflex to ID Panel     Status: None   Collection Time: 07/01/19  6:54 PM   Specimen: BLOOD  Result Value Ref Range Status   Specimen Description BLOOD BLOOD RIGHT HAND  Final   Special Requests   Final    BOTTLES DRAWN AEROBIC AND ANAEROBIC Blood Culture adequate volume   Culture   Final    NO GROWTH 5 DAYS Performed at [facility], [address], [place], [place] [address]    Report Status 07/06/2019 FINAL  Final    Coagulation Studies: No results for input(s): LABPROT, INR in the last 72 hours.  Urinalysis: Recent Labs    07/06/19 1116  COLORURINE YELLOW*  LABSPEC 1.009  PHURINE 6.0  GLUCOSEU NEGATIVE  HGBUR NEGATIVE  BILIRUBINUR NEGATIVE  KETONESUR NEGATIVE  PROTEINUR NEGATIVE  NITRITE NEGATIVE  LEUKOCYTESUR NEGATIVE      Imaging: No results found.   Medications:   . sodium chloride Stopped (07/05/19 0932)  . sodium chloride    . magnesium sulfate bolus  IVPB 2 g (07/07/19 1608)  . potassium chloride 10  mEq (07/07/19 1508)  . potassium chloride     . amitriptyline  75 mg Oral QHS  . aspirin EC  81 mg Oral Daily  . atorvastatin  20 mg Oral Daily  . B-complex with vitamin C  1 tablet Oral Daily  . dicyclomine  20 mg Oral TID AC & HS  . DULoxetine  60 mg Oral Daily  . enoxaparin (LOVENOX) injection  40 mg Subcutaneous Q24H  . feeding supplement (ENSURE ENLIVE)  237 mL Oral BID BM  . folic acid  1 mg Oral Daily  . multivitamin with minerals  1 tablet Oral Daily  . octreotide  100 mcg Subcutaneous Q8H  . pantoprazole  40 mg Oral Daily  . sodium chloride flush  10-40 mL Intracatheter Q12H  . vitamin B-12  1,000 mcg Oral Daily  . Vitamin D (Ergocalciferol)  50,000 Units Oral Q7 days   sodium chloride, acetaminophen, ALPRAZolam, diphenoxylate-atropine, gabapentin, hydrOXYzine, ondansetron (ZOFRAN) IV, oxyCODONE, promethazine **OR** promethazine **OR** promethazine, sodium chloride flush, traMADol  Assessment/ Plan:  34 y.o. female with a PMHx of chronic diarrhea, hypertension, hypokalemia, hyperlipidemia, anxiety, who was admitted to [facility] on 07/01/2019 for evaluation of diarrhea that has been chronic in nature along with nausea and abdominal pain.  She states that she has had significant diarrhea since April.   1.  Acute kidney injury likely secondary to diarrhea and prolonged volume depletion. 2.  Hypokalemia likely due to diarrhea. 3.  Metabolic acidosis 4.  Chronic diarrhea being followed closely by gastroenterology.  Plan: Patient's renal function worse today at 1.37.  She did temporarily lose IV access as she was having pain at the side of the midline.  This was to be replaced today.  Restart IV fluids thereafter.  Agree with ongoing efforts at potassium and magnesium repletion.   LOS: 6 Kiyoshi Schaab 2/18/20214:14 PM

## 2019-07-07 NOTE — Progress Notes (Signed)
Lab called me this am and instructed me to collect another stool specimen because the sample that lab at [place] received was not enough for the test. At this time ,awaiting for stool sample for GI panel testing. MD aware.

## 2019-07-08 ENCOUNTER — Encounter: Payer: Self-pay | Admitting: Hematology and Oncology

## 2019-07-08 LAB — BASIC METABOLIC PANEL
Anion gap: 9 (ref 5–15)
BUN: 15 mg/dL (ref 6–20)
CO2: 24 mmol/L (ref 22–32)
Calcium: 8.6 mg/dL — ABNORMAL LOW (ref 8.9–10.3)
Chloride: 105 mmol/L (ref 98–111)
Creatinine, Ser: 1.22 mg/dL — ABNORMAL HIGH (ref 0.44–1.00)
GFR calc Af Amer: >60 mL/min (ref >60)
GFR calc non Af Amer: 57 mL/min — ABNORMAL LOW (ref >60)
Glucose, Bld: 111 mg/dL — ABNORMAL HIGH (ref 70–99)
Potassium: 3.5 mmol/L (ref 3.5–5.1)
Sodium: 138 mmol/L (ref 135–145)

## 2019-07-08 LAB — CBC
HCT: 31.2 % — ABNORMAL LOW (ref 36.0–46.0)
Hemoglobin: 10.8 g/dL — ABNORMAL LOW (ref 12.0–15.0)
MCH: 28.7 pg (ref 26.0–34.0)
MCHC: 34.6 g/dL (ref 30.0–36.0)
MCV: 83 fL (ref 80.0–100.0)
Platelets: 505 10*3/uL — ABNORMAL HIGH (ref 150–400)
RBC: 3.76 MIL/uL — ABNORMAL LOW (ref 3.87–5.11)
RDW: 13 % (ref 11.5–15.5)
WBC: 13.3 10*3/uL — ABNORMAL HIGH (ref 4.0–10.5)
nRBC: 0 % (ref 0.0–0.2)

## 2019-07-08 LAB — PROCALCITONIN: Procalcitonin: <0.1 ng/mL

## 2019-07-08 LAB — PHOSPHORUS: Phosphorus: 3.1 mg/dL (ref 2.5–4.6)

## 2019-07-08 LAB — MAGNESIUM: Magnesium: 1.9 mg/dL (ref 1.7–2.4)

## 2019-07-08 MED ORDER — POTASSIUM CHLORIDE 10 MEQ/100ML IV SOLN
10.0000 meq | INTRAVENOUS | Status: AC
  Administered 2019-07-08 (×4): 10 meq via INTRAVENOUS
  Filled 2019-07-08 (×4): qty 100

## 2019-07-08 MED ORDER — POTASSIUM CHLORIDE CRYS ER 20 MEQ PO TBCR
40.0000 meq | EXTENDED_RELEASE_TABLET | Freq: Once | ORAL | Status: AC
  Administered 2019-07-08: 12:00:00 40 meq via ORAL
  Filled 2019-07-08: qty 2

## 2019-07-08 NOTE — Progress Notes (Signed)
PROGRESS NOTE    Molly Cisneros  [id number] DOB: 1985-07-26 DOA: 07/01/2019 PCP: Glean Hess, MD    Brief Narrative:  Ms Ketcherside is a 34 y.o. F with chronic diarrhea, HTN, hx Cdiff and mood disorder who presented to her oncologist's office with few days worsening of her chronic abdominal pain and diarrhea and was sent to the ER.  In the ER, BP 89/60, patient pale and syncopized at the triage desk.  WBC 12K, K 2.1.  Na 118, Cr 1.53 (from baseline 0.9).   Assessment & Plan:  Severe chronic diarrhea causing severe symptomatic hypokalemia, hyponatremia, and acute kidney injury Patient admitted and started on octreotide, IV fluids, home dicyclomine.   Diarrhea 5 episodes yesterday, 3 episodes today small amount patient feels improvement -Continue octreotide -continue  IV fluids 75 ml/hr and IV K and po K given today.  continue oral diet -s/p IV K phos, resolved, phosphorus normal -Monitor potassium daily and replete accordingly -Consult GI, appreciate expert advice -Continue dicyclomine, Lomotil  Hypokalemia Admitted with severe hypokalemia 2.1.  Mag normal. s/p K supplemented aggressively. K 2.1 > 2.9 > 3.1 > 2.8 > 3.2 > 3.3 > 3.2>3.5 today  Patient received K po Monitor potassium daily and replete accordingly  Hyponatremia due to severe diarrhea Na 124=>138 >134>136>138 resolved   Hypomagnesemia, magnesium repleted IV.  Continue to monitor and replete as needed. Mag 1.9  Today  Acute kidney injury Non-gap metabolic acidosis Cr 99991111 at admission (baseline 0.9).  Likely hypovolemic injury. -Continue IV fluids, Cr 1.22 >1.37>1.22 today -Consulted Nephrology  Hypertension Chart history, not on meds.  BP stable, not hypotensive  Other medications -Continue amitriptyline -Continue atorvastatin -Continue duloxetine -Hold pantoprazole for now  Anemia This is likely all dilutional.  Mood disorder -Hold Adderall   Thrombocytosis, elevated platelet count, recommend  to follow with hematologist as an outpatient for further work-up Started aspirin 81 mg p.o. daily and PPI for GI prophylaxis  Leukocytosis noticed on 07/07/2019 WBC count 13.8 => 13.3 today Patient remained afebrile, procalcitonin negative most likely reactive leukocytosis Continue to monitor daily   Disposition: Patient still weak, diarrhea 6 episodes yesterday and 2 episodes today, still nauseous and decreased appetite.  Renal functions gradually improving, continuing IV fluids today. Potassium repleted orally. We will continue to monitor electrolytes, and replete.   Nephrology following plan to discharge tomorrow if electrolytes within normal range, check with nephrologist first before discharge planning.  If better, likely home tomorrow, as octreotide has been approved and ordered by Dr. Mike Gip from [place] after much diligent work and delivered from [employer] to the patient's home, which she confirmed and knows how to inject herself.   MDM: The below labs and imaging reports reviewed and summarized above.  Medication management as above.    DVT prophylaxis: Lovenox Code Status: FULL Family Communication: No one available at bedside, previous provider had discussion with Dr. Barbette Reichmann at the bedside   Consultants:   GI  Nephrology  Procedures:     Antimicrobials:      Culture data:   2/12 GI pathogen panel still pending (insufficient sample 2/18)  2/12 blood cultures x2 NG  2/18 GI pathogen and C. diff   Subjective: No overnight issues, pt was seen and examined at bedside. Patient had 6 episodes of diarrhea yesterday, 2 episodes today morning.  Patient feels improvement in the volume of diarrhea.  Still has decreased appetite and feels tired.     Objective: Vitals:   07/07/19 1317 07/07/19  2050 07/08/19 0353 07/08/19 1356  BP: 99/68 99/77 101/61 (!) 89/59  Pulse: 98 91 (!) 108 98  Resp: 18 16 18    Temp: 98.1 F (36.7 C) 98.3 F (36.8 C)  98.5 F (36.9 C) 98.6 F (37 C)  TempSrc: Oral Oral Oral Axillary  SpO2: 98% 97% 96% 98%  Weight:      Height:        Intake/Output Summary (Last 24 hours) at 07/08/2019 1421 Last data filed at 07/08/2019 0300 Gross per 24 hour  Intake 899.89 ml  Output --  Net 899.89 ml   Filed Weights   07/01/19 1240  Weight: 97.7 kg    Examination: General appearance:  adult female, alert and in no acute distress but appears sick and weak.   HEENT: Anicteric, conjunctiva pink, lids and lashes normal. No nasal deformity, discharge, epistaxis.  Lips dry, teeth normal. OP tacky dry  Skin: Warm and dry.  No suspicious rashes or lesions. Cardiac: RRR, no murmurs appreciated.  No LE edema.    Respiratory: Normal respiratory rate and rhythm.  CTAB without rales or wheezes. Abdomen: Abdomen soft.  No focal TTP or guarding. No ascites, distension, hepatosplenomegaly.   MSK: No deformities or effusions of the large joints of the upper or lower extremities bilaterally. Neuro: Awake and alert. Naming is grossly intact, and the patient's recall, recent and remote, as well as general fund of knowledge seem within normal limits.  Muscle tone normal, without fasciculations.  Moves all extremities equally and with normal coordination but generalized weakness.  Speech fluent.    Psych: Sensorium intact and responding to questions, attention nomal. Affect normal.  Judgment and insight appear normal.    Data Reviewed: I have personally reviewed following labs and imaging studies:  CBC: Recent Labs  Lab 07/02/19 0353 07/03/19 0511 07/07/19 0601 07/08/19 0258  WBC 8.7 8.4 13.8* 13.3*  HGB 11.1* 10.8* 11.6* 10.8*  HCT 30.0* 30.0* 33.4* 31.2*  MCV 79.8* 81.3 83.5 83.0  PLT 491* 499* 522* [id number]*   Basic Metabolic Panel: Recent Labs  Lab 07/05/19 0547 07/05/19 0547 07/05/19 1421 07/06/19 0626 07/06/19 1819 07/07/19 0601 07/08/19 0258  NA 138  --   --  138 134* 136 138  K 3.1*   < > 3.5 3.3* 2.8* 3.2*  3.5  CL 107  --   --  101 101 102 105  CO2 25  --   --  25 22 24 24   GLUCOSE 97  --   --  132* 151* 98 111*  BUN 10  --   --  11 10 13 15   CREATININE 1.15*  --   --  1.22* 1.15* 1.37* 1.22*  CALCIUM 8.6*  --   --  8.8* 8.7* 8.8* 8.6*  MG  --   --   --   --  1.1* 1.4* 1.9  PHOS 1.4*   < > 1.5* 5.9* 3.3 4.1 3.1   < > = values in this interval not displayed.   GFR: Estimated Creatinine Clearance: 82.9 mL/min (A) (by C-G formula based on SCr of 1.22 mg/dL (H)). Liver Function Tests: Recent Labs  Lab 07/04/19 0538 07/05/19 0547 07/06/19 0626  ALBUMIN 3.2* 2.9* 3.4*   No results for input(s): LIPASE, AMYLASE in the last 168 hours. No results for input(s): AMMONIA in the last 168 hours. Coagulation Profile: No results for input(s): INR, PROTIME in the last 168 hours. Cardiac Enzymes: No results for input(s): CKTOTAL, CKMB, CKMBINDEX, TROPONINI in the last 168  hours. BNP (last 3 results) No results for input(s): PROBNP in the last 8760 hours. HbA1C: No results for input(s): HGBA1C in the last 72 hours. CBG: No results for input(s): GLUCAP in the last 168 hours. Lipid Profile: No results for input(s): CHOL, HDL, LDLCALC, TRIG, CHOLHDL, LDLDIRECT in the last 72 hours. Thyroid Function Tests: No results for input(s): TSH, T4TOTAL, FREET4, T3FREE, THYROIDAB in the last 72 hours. Anemia Panel: No results for input(s): VITAMINB12, FOLATE, FERRITIN, TIBC, IRON, RETICCTPCT in the last 72 hours. Urine analysis:    Component Value Date/Time   COLORURINE YELLOW (A) 07/06/2019 1116   APPEARANCEUR HAZY (A) 07/06/2019 1116   LABSPEC 1.009 07/06/2019 1116   PHURINE 6.0 07/06/2019 1116   GLUCOSEU NEGATIVE 07/06/2019 1116   HGBUR NEGATIVE 07/06/2019 1116   [place] 07/06/2019 1116   [place] 07/06/2019 1116   PROTEINUR NEGATIVE 07/06/2019 1116   NITRITE NEGATIVE 07/06/2019 1116   LEUKOCYTESUR NEGATIVE 07/06/2019 1116   Sepsis  Labs: @LABRCNTIP (procalcitonin:4,lacticacidven:4)  ) Recent Results (from the past 240 hour(s))  SARS CORONAVIRUS 2 (TAT 6-24 HRS) Nasopharyngeal Nasopharyngeal Swab     Status: None   Collection Time: 07/01/19  3:05 PM   Specimen: Nasopharyngeal Swab  Result Value Ref Range Status   SARS Coronavirus 2 NEGATIVE NEGATIVE Final    Comment: (NOTE) SARS-CoV-2 target nucleic acids are NOT DETECTED. The SARS-CoV-2 RNA is generally detectable in upper and lower respiratory specimens during the acute phase of infection. Negative results do not preclude SARS-CoV-2 infection, do not rule out co-infections with other pathogens, and should not be used as the sole basis for treatment or other patient management decisions. Negative results must be combined with clinical observations, patient history, and epidemiological information. The expected result is Negative. Fact Sheet for Patients: SugarRoll.be Fact Sheet for Healthcare Providers: https://www.woods-mathews.com/ This test is not yet approved or cleared by the [place] FDA and  has been authorized for detection and/or diagnosis of SARS-CoV-2 by FDA under an Emergency Use Authorization (EUA). This EUA will remain  in effect (meaning this test can be used) for the duration of the COVID-19 declaration under Section 56 4(b)(1) of the Act, 21 U.S.C. section 360bbb-3(b)(1), unless the authorization is terminated or revoked sooner. Performed at [facility], [place] [address] [place], [place] [address]   CULTURE, BLOOD (ROUTINE X 2) w Reflex to ID Panel     Status: None   Collection Time: 07/01/19  6:42 PM   Specimen: BLOOD  Result Value Ref Range Status   Specimen Description BLOOD RIGHT ANTECUBITAL  Final   Special Requests   Final    BOTTLES DRAWN AEROBIC AND ANAEROBIC Blood Culture adequate volume   Culture   Final    NO GROWTH 5 DAYS Performed at [facility], [address] [place], [place] [address]    Report Status 07/06/2019 FINAL  Final  CULTURE, BLOOD (ROUTINE X 2) w Reflex to ID Panel     Status: None   Collection Time: 07/01/19  6:54 PM   Specimen: BLOOD  Result Value Ref Range Status   Specimen Description BLOOD BLOOD RIGHT HAND  Final   Special Requests   Final    BOTTLES DRAWN AEROBIC AND ANAEROBIC Blood Culture adequate volume   Culture   Final    NO GROWTH 5 DAYS Performed at [facility], [address], [place], [place] [address]    Report Status 07/06/2019 FINAL  Final  C Difficile Quick Screen w PCR  reflex     Status: Abnormal   Collection Time: 07/07/19  4:18 PM  Result Value Ref Range Status   C Diff antigen POSITIVE (A) NEGATIVE Final   C Diff toxin NEGATIVE NEGATIVE Final   C Diff interpretation Results are indeterminate. See PCR results.  Final    Comment: Performed at [facility], [address] [place], [place] [address]  Gastrointestinal Panel by PCR , Stool     Status: None   Collection Time: 07/07/19  4:18 PM  Result Value Ref Range Status   Campylobacter species NOT DETECTED NOT DETECTED Final   Plesimonas shigelloides NOT DETECTED NOT DETECTED Final   Salmonella species NOT DETECTED NOT DETECTED Final   Yersinia enterocolitica NOT DETECTED NOT DETECTED Final   Vibrio species NOT DETECTED NOT DETECTED Final   Vibrio cholerae NOT DETECTED NOT DETECTED Final   Enteroaggregative E coli (EAEC) NOT DETECTED NOT DETECTED Final   Enteropathogenic E coli (EPEC) NOT DETECTED NOT DETECTED Final   Enterotoxigenic E coli (ETEC) NOT DETECTED NOT DETECTED Final   Shiga like toxin producing E coli (STEC) NOT DETECTED NOT DETECTED Final   Shigella/Enteroinvasive E coli (EIEC) NOT DETECTED NOT DETECTED Final   Cryptosporidium NOT DETECTED NOT DETECTED Final   Cyclospora cayetanensis NOT DETECTED NOT DETECTED Final   Entamoeba histolytica NOT DETECTED NOT DETECTED Final   Giardia lamblia NOT DETECTED NOT DETECTED  Final   Adenovirus F40/41 NOT DETECTED NOT DETECTED Final   Astrovirus NOT DETECTED NOT DETECTED Final   Norovirus GI/GII NOT DETECTED NOT DETECTED Final   Rotavirus A NOT DETECTED NOT DETECTED Final   Sapovirus (I, II, IV, and V) NOT DETECTED NOT DETECTED Final    Comment: Performed at [facility], [address] [place], [place] [address]  C. Diff by PCR, Reflexed     Status: None   Collection Time: 07/07/19  4:18 PM  Result Value Ref Range Status   Toxigenic C. Difficile by PCR NEGATIVE NEGATIVE Final    Comment: Patient is colonized with non toxigenic C. difficile. May not need treatment unless significant symptoms are present. Performed at [facility], [address] [place], [place] [address]          Radiology Studies: No results found.      Scheduled Meds: . amitriptyline  75 mg Oral QHS  . aspirin EC  81 mg Oral Daily  . atorvastatin  20 mg Oral Daily  . B-complex with vitamin C  1 tablet Oral Daily  . dicyclomine  20 mg Oral TID AC & HS  . DULoxetine  60 mg Oral Daily  . enoxaparin (LOVENOX) injection  40 mg Subcutaneous Q24H  . feeding supplement (ENSURE ENLIVE)  237 mL Oral BID BM  . folic acid  1 mg Oral Daily  . multivitamin with minerals  1 tablet Oral Daily  . octreotide  100 mcg Subcutaneous Q8H  . pantoprazole  40 mg Oral Daily  . sodium chloride flush  10-40 mL Intracatheter Q12H  . vitamin B-12  1,000 mcg Oral Daily  . Vitamin D (Ergocalciferol)  50,000 Units Oral Q7 days   Continuous Infusions: . sodium chloride Stopped (07/05/19 0932)  . sodium chloride 75 mL/hr at 07/08/19 0639  . potassium chloride 10 mEq (07/08/19 1242)     LOS: 7 days    Time spent: 25 minutes    Val Riles, MD [facility] 07/08/2019, 2:21 PM

## 2019-07-08 NOTE — Progress Notes (Signed)
[facility]  ROUNDING NOTE   Subjective:  Patient reports feeling a bit nauseous today. Creatinine down to 1.2. Potassium also normalized to 3.5. Resting comfortably.   Objective:  Vital signs in last 24 hours:  Temp:  [98.3 F (36.8 C)-98.6 F (37 C)] 98.6 F (37 C) (02/19 1356) Pulse Rate:  [86-108] 86 (02/19 1500) Resp:  [16-18] 18 (02/19 0353) BP: (89-101)/(59-77) 94/67 (02/19 1500) SpO2:  [96 %-99 %] 99 % (02/19 1500)  Weight change:  Filed Weights   07/01/19 1240  Weight: 97.7 kg    Intake/Output: I/O last 3 completed shifts: In: 899.9 [I.V.:699.9; IV Piggyback:200] Out: -    Intake/Output this shift:  Total I/O In: 881.6 [I.V.:874.2; IV Piggyback:7.4] Out: 5 [Urine:3; Stool:2]  Physical Exam: General: No acute distress  Head: Normocephalic, atraumatic. Moist oral mucosal membranes  Eyes: Anicteric  Neck: Supple, trachea midline  Lungs:  Clear to auscultation, normal effort  Heart: S1S2 no rubs  Abdomen:  Soft, nontender, nondistended, bowel sounds present  Extremities: No peripheral edema.  Neurologic: Awake, alert, following commands  Skin: No lesions       Basic Metabolic Panel: Recent Labs  Lab 07/05/19 0547 07/05/19 0547 07/05/19 1421 07/06/19 0626 07/06/19 0626 07/06/19 1819 07/07/19 0601 07/08/19 0258  NA 138  --   --  138  --  134* 136 138  K 3.1*   < > 3.5 3.3*  --  2.8* 3.2* 3.5  CL 107  --   --  101  --  101 102 105  CO2 25  --   --  25  --  22 24 24   GLUCOSE 97  --   --  132*  --  151* 98 111*  BUN 10  --   --  11  --  10 13 15   CREATININE 1.15*  --   --  1.22*  --  1.15* 1.37* 1.22*  CALCIUM 8.6*   < >  --  8.8*   < > 8.7* 8.8* 8.6*  MG  --   --   --   --   --  1.1* 1.4* 1.9  PHOS 1.4*   < > 1.5* 5.9*  --  3.3 4.1 3.1   < > = values in this interval not displayed.    Liver Function Tests: Recent Labs  Lab 07/04/19 0538 07/05/19 0547 07/06/19 0626  ALBUMIN 3.2* 2.9* 3.4*   No results for input(s):  LIPASE, AMYLASE in the last 168 hours. No results for input(s): AMMONIA in the last 168 hours.  CBC: Recent Labs  Lab 07/02/19 0353 07/03/19 0511 07/07/19 0601 07/08/19 0258  WBC 8.7 8.4 13.8* 13.3*  HGB 11.1* 10.8* 11.6* 10.8*  HCT 30.0* 30.0* 33.4* 31.2*  MCV 79.8* 81.3 83.5 83.0  PLT 491* 499* 522* 505*    Cardiac Enzymes: No results for input(s): CKTOTAL, CKMB, CKMBINDEX, TROPONINI in the last 168 hours.  BNP: Invalid input(s): POCBNP  CBG: No results for input(s): GLUCAP in the last 168 hours.  Microbiology: Results for orders placed or performed during the hospital encounter of 07/01/19  SARS CORONAVIRUS 2 (TAT 6-24 HRS) Nasopharyngeal Nasopharyngeal Swab     Status: None   Collection Time: 07/01/19  3:05 PM   Specimen: Nasopharyngeal Swab  Result Value Ref Range Status   SARS Coronavirus 2 NEGATIVE NEGATIVE Final    Comment: (NOTE) SARS-CoV-2 target nucleic acids are NOT DETECTED. The SARS-CoV-2 RNA is generally detectable in upper and lower respiratory specimens during the  acute phase of infection. Negative results do not preclude SARS-CoV-2 infection, do not rule out co-infections with other pathogens, and should not be used as the sole basis for treatment or other patient management decisions. Negative results must be combined with clinical observations, patient history, and epidemiological information. The expected result is Negative. Fact Sheet for Patients: SugarRoll.be Fact Sheet for Healthcare Providers: https://www.woods-mathews.com/ This test is not yet approved or cleared by the [place] FDA and  has been authorized for detection and/or diagnosis of SARS-CoV-2 by FDA under an Emergency Use Authorization (EUA). This EUA will remain  in effect (meaning this test can be used) for the duration of the COVID-19 declaration under Section 56 4(b)(1) of the Act, 21 U.S.C. section 360bbb-3(b)(1), unless the  authorization is terminated or revoked sooner. Performed at [facility], [place] [address], [place], [place] [address]   CULTURE, BLOOD (ROUTINE X 2) w Reflex to ID Panel     Status: None   Collection Time: 07/01/19  6:42 PM   Specimen: BLOOD  Result Value Ref Range Status   Specimen Description BLOOD RIGHT ANTECUBITAL  Final   Special Requests   Final    BOTTLES DRAWN AEROBIC AND ANAEROBIC Blood Culture adequate volume   Culture   Final    NO GROWTH 5 DAYS Performed at [facility], [address] [place], [place] [address]    Report Status 07/06/2019 FINAL  Final  CULTURE, BLOOD (ROUTINE X 2) w Reflex to ID Panel     Status: None   Collection Time: 07/01/19  6:54 PM   Specimen: BLOOD  Result Value Ref Range Status   Specimen Description BLOOD BLOOD RIGHT HAND  Final   Special Requests   Final    BOTTLES DRAWN AEROBIC AND ANAEROBIC Blood Culture adequate volume   Culture   Final    NO GROWTH 5 DAYS Performed at [facility], [address], [place], [place] [address]    Report Status 07/06/2019 FINAL  Final  C Difficile Quick Screen w PCR reflex     Status: Abnormal   Collection Time: 07/07/19  4:18 PM  Result Value Ref Range Status   C Diff antigen POSITIVE (A) NEGATIVE Final   C Diff toxin NEGATIVE NEGATIVE Final   C Diff interpretation Results are indeterminate. See PCR results.  Final    Comment: Performed at [facility], [address] [place], [place] [address]  Gastrointestinal Panel by PCR , Stool     Status: None   Collection Time: 07/07/19  4:18 PM  Result Value Ref Range Status   Campylobacter species NOT DETECTED NOT DETECTED Final   Plesimonas shigelloides NOT DETECTED NOT DETECTED Final   Salmonella species NOT DETECTED NOT DETECTED Final   Yersinia enterocolitica NOT DETECTED NOT DETECTED Final   Vibrio species NOT DETECTED NOT DETECTED Final   Vibrio cholerae NOT DETECTED NOT DETECTED Final   Enteroaggregative E coli  (EAEC) NOT DETECTED NOT DETECTED Final   Enteropathogenic E coli (EPEC) NOT DETECTED NOT DETECTED Final   Enterotoxigenic E coli (ETEC) NOT DETECTED NOT DETECTED Final   Shiga like toxin producing E coli (STEC) NOT DETECTED NOT DETECTED Final   Shigella/Enteroinvasive E coli (EIEC) NOT DETECTED NOT DETECTED Final   Cryptosporidium NOT DETECTED NOT DETECTED Final   Cyclospora cayetanensis NOT DETECTED NOT DETECTED Final   Entamoeba histolytica NOT DETECTED NOT DETECTED Final   Giardia lamblia NOT DETECTED NOT DETECTED Final   Adenovirus F40/41 NOT DETECTED  NOT DETECTED Final   Astrovirus NOT DETECTED NOT DETECTED Final   Norovirus GI/GII NOT DETECTED NOT DETECTED Final   Rotavirus A NOT DETECTED NOT DETECTED Final   Sapovirus (I, II, IV, and V) NOT DETECTED NOT DETECTED Final    Comment: Performed at [facility], [address] [place], [place] [address]  C. Diff by PCR, Reflexed     Status: None   Collection Time: 07/07/19  4:18 PM  Result Value Ref Range Status   Toxigenic C. Difficile by PCR NEGATIVE NEGATIVE Final    Comment: Patient is colonized with non toxigenic C. difficile. May not need treatment unless significant symptoms are present. Performed at [facility], [address] [place], [place] [address]     Coagulation Studies: No results for input(s): LABPROT, INR in the last 72 hours.  Urinalysis: Recent Labs    07/06/19 1116  COLORURINE YELLOW*  LABSPEC 1.009  PHURINE 6.0  GLUCOSEU NEGATIVE  HGBUR NEGATIVE  BILIRUBINUR NEGATIVE  KETONESUR NEGATIVE  PROTEINUR NEGATIVE  NITRITE NEGATIVE  LEUKOCYTESUR NEGATIVE      Imaging: No results found.   Medications:   . sodium chloride Stopped (07/05/19 0932)  . potassium chloride 10 mEq (07/08/19 1446)   . amitriptyline  75 mg Oral QHS  . aspirin EC  81 mg Oral Daily  . atorvastatin  20 mg Oral Daily  . B-complex with vitamin C  1 tablet Oral Daily  . dicyclomine  20 mg Oral TID AC & HS   . DULoxetine  60 mg Oral Daily  . enoxaparin (LOVENOX) injection  40 mg Subcutaneous Q24H  . feeding supplement (ENSURE ENLIVE)  237 mL Oral BID BM  . folic acid  1 mg Oral Daily  . multivitamin with minerals  1 tablet Oral Daily  . octreotide  100 mcg Subcutaneous Q8H  . pantoprazole  40 mg Oral Daily  . sodium chloride flush  10-40 mL Intracatheter Q12H  . vitamin B-12  1,000 mcg Oral Daily  . Vitamin D (Ergocalciferol)  50,000 Units Oral Q7 days   sodium chloride, acetaminophen, ALPRAZolam, diphenoxylate-atropine, gabapentin, hydrOXYzine, ondansetron (ZOFRAN) IV, oxyCODONE, promethazine **OR** promethazine **OR** promethazine, sodium chloride flush, traMADol  Assessment/ Plan:  34 y.o. female with a PMHx of chronic diarrhea, hypertension, hypokalemia, hyperlipidemia, anxiety, who was admitted to [facility] on 07/01/2019 for evaluation of diarrhea that has been chronic in nature along with nausea and abdominal pain.  She states that she has had significant diarrhea since April.   1.  Acute kidney injury likely secondary to diarrhea and prolonged volume depletion. 2.  Hypokalemia likely due to diarrhea. 3.  Metabolic acidosis 4.  Chronic diarrhea being followed closely by gastroenterology.  Plan: Renal function appears to be improved today.  Creatinine down to 1.2.  Potassium also normalized at 3.5.  We will continue IV fluid hydration for 1 additional day and I will administer another 40 mEq of potassium chloride IV today.  Metabolic acidosis also significantly improved as compared to admission.  Patient being administered octreotide for her diarrhea.   LOS: 7 Molly Cisneros 2/19/20213:49 PM

## 2019-07-08 NOTE — Plan of Care (Signed)
Patient c/o nausea with effective relief with prn zofran.  Patient able to tolerate food, no emesis. Potassium replacements completed, patient tolerated well. Up independent in room.  States has received home octreotide medication and has sucessfully demonstrated drawing up medication and giving self injection appropriately.

## 2019-07-09 DIAGNOSIS — K219 Gastro-esophageal reflux disease without esophagitis: Secondary | ICD-10-CM

## 2019-07-09 DIAGNOSIS — F418 Other specified anxiety disorders: Secondary | ICD-10-CM

## 2019-07-09 LAB — CBC
HCT: 32.7 % — ABNORMAL LOW (ref 36.0–46.0)
Hemoglobin: 11.2 g/dL — ABNORMAL LOW (ref 12.0–15.0)
MCH: 28.9 pg (ref 26.0–34.0)
MCHC: 34.3 g/dL (ref 30.0–36.0)
MCV: 84.5 fL (ref 80.0–100.0)
Platelets: 503 10*3/uL — ABNORMAL HIGH (ref 150–400)
RBC: 3.87 MIL/uL (ref 3.87–5.11)
RDW: 13.5 % (ref 11.5–15.5)
WBC: 11.8 10*3/uL — ABNORMAL HIGH (ref 4.0–10.5)
nRBC: 0 % (ref 0.0–0.2)

## 2019-07-09 LAB — BASIC METABOLIC PANEL
Anion gap: 9 (ref 5–15)
BUN: 19 mg/dL (ref 6–20)
CO2: 25 mmol/L (ref 22–32)
Calcium: 8.6 mg/dL — ABNORMAL LOW (ref 8.9–10.3)
Chloride: 106 mmol/L (ref 98–111)
Creatinine, Ser: 1.23 mg/dL — ABNORMAL HIGH (ref 0.44–1.00)
GFR calc Af Amer: >60 mL/min (ref >60)
GFR calc non Af Amer: 57 mL/min — ABNORMAL LOW (ref >60)
Glucose, Bld: 104 mg/dL — ABNORMAL HIGH (ref 70–99)
Potassium: 4 mmol/L (ref 3.5–5.1)
Sodium: 140 mmol/L (ref 135–145)

## 2019-07-09 LAB — PROCALCITONIN: Procalcitonin: <0.1 ng/mL

## 2019-07-09 MED ORDER — SODIUM CHLORIDE 0.9 % IV SOLN: INTRAVENOUS | Status: DC

## 2019-07-09 NOTE — Progress Notes (Signed)
PROGRESS NOTE    Molly Cisneros  [id number] DOB: [birth year] DOA: 07/01/2019 PCP: Molly Hess, MD   Brief Narrative:  Ms Molly Cisneros is a 34 y.o. F with chronic diarrhea, HTN, hx Cdiff and mood disorder who presented to her oncologist's office with few days worsening of her chronic abdominal pain and diarrhea and was sent to the ER.  In the ER, BP 89/60, patient pale and syncopized at the triage desk.  WBC 12K, K 2.1.  Na 118, Cr 1.53 (from baseline 0.9).   Assessment & Plan:   Principal Problem:   Chronic diarrhea of unknown origin Active Problems:   Essential hypertension   Hyperlipidemia, mixed   Hypokalemia   Hyponatremia   Sepsis (HCC)   AKI (acute kidney injury) ([place])   GERD (gastroesophageal reflux disease)   Depression with anxiety   Severe chronic diarrhea causing severe symptomatic hypokalemia, hyponatremia, and acute kidney injury Patient admitted and started on octreotide, IV fluids, home dicyclomine.   Diarrhea still persisting  -Continue octreotide -Restarted fluids at 100 mL/h - continue oral diet -Recheck electrolytes in the morning  Hypokalemia Admitted with severe hypokalemia 2.1.  Mag normal. s/p K supplemented aggressively. K 2.1 > 2.9 > 3.1 > 2.8 > 3.2 > 3.3 > 3.2>3.5>4 today  Monitor potassium daily and replete accordingly  Hyponatremia due to severe diarrhea Na 124=>138 >134>136>138>140 resolved   Hypomagnesemia, magnesium repleted IV.  Continue to monitor and replete as needed. Mag 1.9   Acute kidney injury Non-gap metabolic acidosis Cr [id number] at admission (baseline 0.9).  Likely hypovolemic injury. -Continue IV fluids, Cr 1.22 >1.37>1.22>1.23  today -Consulted Nephrology  Hypertension Chart history, not on meds.    Patient hypotensive We will add fluids back in, Cancel discharge Monitor and additional day with fluid resuscitation  Other medications -Continue amitriptyline -Continue atorvastatin -Continue duloxetine -Hold  pantoprazole for now  Anemia This is likely all dilutional.  Mood disorder -Hold Adderall   Thrombocytosis, elevated platelet count, recommend to follow with hematologist as an outpatient for further work-up Started aspirin 81 mg p.o. daily and PPI for GI prophylaxis  Leukocytosis noticed on 07/07/2019 WBC count 13.8 => 13.3 today Patient remained afebrile, procalcitonin negative most likely reactive leukocytosis Continue to monitor daily  DVT prophylaxis: Lovenox SQ  Code Status: Full code    Code Status Orders  (From admission, onward)         Start     Ordered   07/01/19 1836  Full code  Continuous     07/01/19 1836        Code Status History    Date Active Date Inactive Code Status Order ID Comments User Context   06/11/2019 1431 06/16/2019 2206 Full Code [id number]  Para Skeans, MD ED   02/13/2019 0034 02/18/2019 1741 Full Code [id number]  Mansy, Arvella Merles, MD ED   02/05/2019 2230 02/09/2019 1747 Full Code [id number]  Lance Coon, MD Inpatient   11/06/2018 1742 11/06/2018 2253 Full Code [id number]  Herbert Pun, MD ED   Advance Care Planning Activity     Family Communication: None today Disposition Plan: Cussed discharge today, repeat blood pressure still remains soft with persistent diarrhea.  Patient requiring IV fluids for fluid resuscitation is not yet stable for discharge.  Consults called: None Admission status: Inpatient   Consultants:   gi  Procedures:  MR ENTERO ABDOMEN W WO CONTRAST  Result Date: 06/28/2019 CLINICAL DATA:  Diarrhea, weight loss, epigastric pain, recent appendectomy EXAM: MR ABDOMEN AND PELVIS WITHOUT  AND WITH CONTRAST (MR ENTEROGRAPHY) TECHNIQUE: Multiplanar, multisequence MRI of the abdomen and pelvis was performed both before and during bolus administration of intravenous contrast. Negative oral contrast VoLumen was given. CONTRAST:  22mL GADAVIST GADOBUTROL 1 MMOL/ML IV SOLN COMPARISON:  CT abdomen/pelvis dated 02/13/2019 FINDINGS:  Lower chest: Not visualized Hepatobiliary: Visualized liver is within normal limits. Gallbladder is unremarkable. No intrahepatic or extrahepatic ductal dilatation. No choledocholithiasis is seen. Pancreas:  Within normal limits. Spleen:  Within normal limits. Adrenals/Urinary Tract:  Adrenal glands are within normal limits. Kidneys are within normal limits.  No hydronephrosis. Bladder is within normal limits. Stomach/Bowel: Stomach is within normal limits. No evidence of bowel obstruction. No small bowel wall thickening, mass, or abnormality is seen. Specifically, the mesenteric stranding/inflammatory changes involving a loop of jejunum in the left mid abdomen has essentially resolved. Prior appendectomy. No colonic wall thickening or mass is evident. Vascular/Lymphatic:  No evidence of abdominal aortic aneurysm. No suspicious abdominopelvic lymphadenopathy. Reproductive: Uterus is within normal limits. Left ovary is within normal limits.  No right adnexal mass. Other:  No abdominopelvic ascites. Musculoskeletal: No focal osseous lesions. IMPRESSION: No small bowel wall thickening, mass, or abnormality is seen. Specifically, the prior mesenteric stranding/inflammatory changes involving a loop of jejunum in the left mid abdomen has essentially resolved. No colonic wall thickening or mass is evident. No evidence of bowel obstruction.  Prior appendectomy. Electronically Signed   By: Julian Hy M.D.   On: 06/28/2019 11:29   MR ENTERO PELVIS W WO CONTRAST  Result Date: 06/28/2019 CLINICAL DATA:  Diarrhea, weight loss, epigastric pain, recent appendectomy EXAM: MR ABDOMEN AND PELVIS WITHOUT AND WITH CONTRAST (MR ENTEROGRAPHY) TECHNIQUE: Multiplanar, multisequence MRI of the abdomen and pelvis was performed both before and during bolus administration of intravenous contrast. Negative oral contrast VoLumen was given. CONTRAST:  70mL GADAVIST GADOBUTROL 1 MMOL/ML IV SOLN COMPARISON:  CT abdomen/pelvis dated  02/13/2019 FINDINGS: Lower chest: Not visualized Hepatobiliary: Visualized liver is within normal limits. Gallbladder is unremarkable. No intrahepatic or extrahepatic ductal dilatation. No choledocholithiasis is seen. Pancreas:  Within normal limits. Spleen:  Within normal limits. Adrenals/Urinary Tract:  Adrenal glands are within normal limits. Kidneys are within normal limits.  No hydronephrosis. Bladder is within normal limits. Stomach/Bowel: Stomach is within normal limits. No evidence of bowel obstruction. No small bowel wall thickening, mass, or abnormality is seen. Specifically, the mesenteric stranding/inflammatory changes involving a loop of jejunum in the left mid abdomen has essentially resolved. Prior appendectomy. No colonic wall thickening or mass is evident. Vascular/Lymphatic:  No evidence of abdominal aortic aneurysm. No suspicious abdominopelvic lymphadenopathy. Reproductive: Uterus is within normal limits. Left ovary is within normal limits.  No right adnexal mass. Other:  No abdominopelvic ascites. Musculoskeletal: No focal osseous lesions. IMPRESSION: No small bowel wall thickening, mass, or abnormality is seen. Specifically, the prior mesenteric stranding/inflammatory changes involving a loop of jejunum in the left mid abdomen has essentially resolved. No colonic wall thickening or mass is evident. No evidence of bowel obstruction.  Prior appendectomy. Electronically Signed   By: Julian Hy M.D.   On: 06/28/2019 11:29     Antimicrobials:   None   Subjective: Patient reports headache yesterday with hypotension. Also reported 2 episodes of diarrhea this morning and midnight  Objective: Vitals:   07/08/19 1500 07/08/19 2032 07/09/19 0510 07/09/19 1008  BP: 94/67 103/73 103/67 103/71  Pulse: 86 (!) 108 92 100  Resp:  18    Temp:  98.4 F (36.9 C) 98.3 F (  36.8 C) 97.9 F (36.6 C)  TempSrc:  [place]  SpO2: 99% 100% 97% 98%  Weight:      Height:         Intake/Output Summary (Last 24 hours) at 07/09/2019 1053 Last data filed at 07/09/2019 0900 Gross per 24 hour  Intake 1971.84 ml  Output 5 ml  Net 1966.84 ml   Filed Weights   07/01/19 1240  Weight: 97.7 kg    Examination:  General exam: Appears calm and comfortable  Respiratory system: Clear to auscultation. Respiratory effort normal. Cardiovascular system: S1 & S2 heard, RRR. No JVD, murmurs, rubs, gallops or clicks. No pedal edema. Gastrointestinal system: Abdomen is nondistended, soft and mildly tender. No organomegaly or masses felt. Normal bowel sounds heard. Central nervous system: Alert and oriented. No focal neurological deficits. Extremities: Warm well perfused, no edema. Skin: No rashes, lesions or ulcers Psychiatry: Judgement and insight appear normal. Mood & affect appropriate.     Data Reviewed: I have personally reviewed following labs and imaging studies  CBC: Recent Labs  Lab 07/03/19 0511 07/07/19 0601 07/08/19 0258 07/09/19 0656  WBC 8.4 13.8* 13.3* 11.8*  HGB 10.8* 11.6* 10.8* 11.2*  HCT 30.0* 33.4* 31.2* 32.7*  MCV 81.3 83.5 83.0 84.5  PLT 499* 522* 505* [id number]*   Basic Metabolic Panel: Recent Labs  Lab 07/05/19 0547 07/05/19 1421 07/06/19 0626 07/06/19 1819 07/07/19 0601 07/08/19 0258 07/09/19 0656  NA   < >  --  138 134* 136 138 140  K   < > 3.5 3.3* 2.8* 3.2* 3.5 4.0  CL   < >  --  101 101 102 105 106  CO2   < >  --  25 22 24 24 25   GLUCOSE   < >  --  132* 151* 98 111* 104*  BUN   < >  --  11 10 13 15 19   CREATININE   < >  --  1.22* 1.15* 1.37* 1.22* 1.23*  CALCIUM   < >  --  8.8* 8.7* 8.8* 8.6* 8.6*  MG  --   --   --  1.1* 1.4* 1.9  --   PHOS  --  1.5* 5.9* 3.3 4.1 3.1  --    < > = values in this interval not displayed.   GFR: Estimated Creatinine Clearance: 82.2 mL/min (A) (by C-G formula based on SCr of 1.23 mg/dL (H)). Liver Function Tests: Recent Labs  Lab 07/04/19 0538 07/05/19 0547 07/06/19 0626  ALBUMIN 3.2* 2.9*  3.4*   No results for input(s): LIPASE, AMYLASE in the last 168 hours. No results for input(s): AMMONIA in the last 168 hours. Coagulation Profile: No results for input(s): INR, PROTIME in the last 168 hours. Cardiac Enzymes: No results for input(s): CKTOTAL, CKMB, CKMBINDEX, TROPONINI in the last 168 hours. BNP (last 3 results) No results for input(s): PROBNP in the last 8760 hours. HbA1C: No results for input(s): HGBA1C in the last 72 hours. CBG: No results for input(s): GLUCAP in the last 168 hours. Lipid Profile: No results for input(s): CHOL, HDL, LDLCALC, TRIG, CHOLHDL, LDLDIRECT in the last 72 hours. Thyroid Function Tests: No results for input(s): TSH, T4TOTAL, FREET4, T3FREE, THYROIDAB in the last 72 hours. Anemia Panel: No results for input(s): VITAMINB12, FOLATE, FERRITIN, TIBC, IRON, RETICCTPCT in the last 72 hours. Sepsis Labs: Recent Labs  Lab 07/08/19 0258 07/09/19 0656  PROCALCITON <0.10 <0.10    Recent Results (from the past 240 hour(s))  SARS CORONAVIRUS 2 (  TAT 6-24 HRS) Nasopharyngeal Nasopharyngeal Swab     Status: None   Collection Time: 07/01/19  3:05 PM   Specimen: Nasopharyngeal Swab  Result Value Ref Range Status   SARS Coronavirus 2 NEGATIVE NEGATIVE Final    Comment: (NOTE) SARS-CoV-2 target nucleic acids are NOT DETECTED. The SARS-CoV-2 RNA is generally detectable in upper and lower respiratory specimens during the acute phase of infection. Negative results do not preclude SARS-CoV-2 infection, do not rule out co-infections with other pathogens, and should not be used as the sole basis for treatment or other patient management decisions. Negative results must be combined with clinical observations, patient history, and epidemiological information. The expected result is Negative. Fact Sheet for Patients: SugarRoll.be Fact Sheet for Healthcare Providers: https://www.woods-mathews.com/ This test is not  yet approved or cleared by the [place] FDA and  has been authorized for detection and/or diagnosis of SARS-CoV-2 by FDA under an Emergency Use Authorization (EUA). This EUA will remain  in effect (meaning this test can be used) for the duration of the COVID-19 declaration under Section 56 4(b)(1) of the Act, 21 U.S.C. section 360bbb-3(b)(1), unless the authorization is terminated or revoked sooner. Performed at [facility], [place] [address], [place], [place] [address]   CULTURE, BLOOD (ROUTINE X 2) w Reflex to ID Panel     Status: None   Collection Time: 07/01/19  6:42 PM   Specimen: BLOOD  Result Value Ref Range Status   Specimen Description BLOOD RIGHT ANTECUBITAL  Final   Special Requests   Final    BOTTLES DRAWN AEROBIC AND ANAEROBIC Blood Culture adequate volume   Culture   Final    NO GROWTH 5 DAYS Performed at [facility], [address] [place], [place] [address]    Report Status 07/06/2019 FINAL  Final  CULTURE, BLOOD (ROUTINE X 2) w Reflex to ID Panel     Status: None   Collection Time: 07/01/19  6:54 PM   Specimen: BLOOD  Result Value Ref Range Status   Specimen Description BLOOD BLOOD RIGHT HAND  Final   Special Requests   Final    BOTTLES DRAWN AEROBIC AND ANAEROBIC Blood Culture adequate volume   Culture   Final    NO GROWTH 5 DAYS Performed at [facility], [address] [place], [place] [address]    Report Status 07/06/2019 FINAL  Final  C Difficile Quick Screen w PCR reflex     Status: Abnormal   Collection Time: 07/07/19  4:18 PM  Result Value Ref Range Status   C Diff antigen POSITIVE (A) NEGATIVE Final   C Diff toxin NEGATIVE NEGATIVE Final   C Diff interpretation Results are indeterminate. See PCR results.  Final    Comment: Performed at [facility], [address] [place], [place] [address]  Gastrointestinal Panel by PCR , Stool     Status: None   Collection Time: 07/07/19  4:18 PM  Result Value Ref Range  Status   Campylobacter species NOT DETECTED NOT DETECTED Final   Plesimonas shigelloides NOT DETECTED NOT DETECTED Final   Salmonella species NOT DETECTED NOT DETECTED Final   Yersinia enterocolitica NOT DETECTED NOT DETECTED Final   Vibrio species NOT DETECTED NOT DETECTED Final   Vibrio cholerae NOT DETECTED NOT DETECTED Final   Enteroaggregative E coli (EAEC) NOT DETECTED NOT DETECTED Final   Enteropathogenic E coli (EPEC) NOT DETECTED NOT DETECTED Final   Enterotoxigenic E coli (ETEC) NOT DETECTED NOT DETECTED Final  Shiga like toxin producing E coli (STEC) NOT DETECTED NOT DETECTED Final   Shigella/Enteroinvasive E coli (EIEC) NOT DETECTED NOT DETECTED Final   Cryptosporidium NOT DETECTED NOT DETECTED Final   Cyclospora cayetanensis NOT DETECTED NOT DETECTED Final   Entamoeba histolytica NOT DETECTED NOT DETECTED Final   Giardia lamblia NOT DETECTED NOT DETECTED Final   Adenovirus F40/41 NOT DETECTED NOT DETECTED Final   Astrovirus NOT DETECTED NOT DETECTED Final   Norovirus GI/GII NOT DETECTED NOT DETECTED Final   Rotavirus A NOT DETECTED NOT DETECTED Final   Sapovirus (I, II, IV, and V) NOT DETECTED NOT DETECTED Final    Comment: Performed at [facility], [address] [place], [place] [address]  C. Diff by PCR, Reflexed     Status: None   Collection Time: 07/07/19  4:18 PM  Result Value Ref Range Status   Toxigenic C. Difficile by PCR NEGATIVE NEGATIVE Final    Comment: Patient is colonized with non toxigenic C. difficile. May not need treatment unless significant symptoms are present. Performed at [facility], [address] [place], [place] [address]          Radiology Studies: No results found.      Scheduled Meds: . amitriptyline  75 mg Oral QHS  . aspirin EC  81 mg Oral Daily  . atorvastatin  20 mg Oral Daily  . B-complex with vitamin C  1 tablet Oral Daily  . dicyclomine  20 mg Oral TID AC & HS  . DULoxetine  60 mg Oral Daily  .  enoxaparin (LOVENOX) injection  40 mg Subcutaneous Q24H  . feeding supplement (ENSURE ENLIVE)  237 mL Oral BID BM  . folic acid  1 mg Oral Daily  . multivitamin with minerals  1 tablet Oral Daily  . octreotide  100 mcg Subcutaneous Q8H  . pantoprazole  40 mg Oral Daily  . sodium chloride flush  10-40 mL Intracatheter Q12H  . vitamin B-12  1,000 mcg Oral Daily  . Vitamin D (Ergocalciferol)  50,000 Units Oral Q7 days   Continuous Infusions: . sodium chloride Stopped (07/05/19 0932)  . sodium chloride       LOS: 8 days    Time spent: 35 min    Nicolette Bang, MD [facility]  If 7PM-7AM, please contact night-coverage  07/09/2019, 10:53 AM

## 2019-07-09 NOTE — Progress Notes (Signed)
[facility]  ROUNDING NOTE   Subjective:   Complains of nausea, vomiting and headache.   Objective:  Vital signs in last 24 hours:  Temp:  [97.9 F (36.6 C)-98.6 F (37 C)] 97.9 F (36.6 C) (02/20 1008) Pulse Rate:  [86-108] 100 (02/20 1008) Resp:  [18] 18 (02/19 2032) BP: (89-103)/(59-73) 103/71 (02/20 1008) SpO2:  [97 %-100 %] 98 % (02/20 1008)  Weight change:  Filed Weights   07/01/19 1240  Weight: 97.7 kg    Intake/Output: I/O last 3 completed shifts: In: 2871.7 [P.O.:780; I.V.:1867.8; IV Piggyback:223.9] Out: 5 [Urine:3; Stool:2]   Intake/Output this shift:  Total I/O In: 240 [P.O.:240] Out: -   Physical Exam: General: No acute distress, laying in bed  Head: Normocephalic, atraumatic. Moist oral mucosal membranes  Eyes: Anicteric  Neck: Supple, trachea midline  Lungs:  Clear to auscultation   Heart: regular  Abdomen:  Soft, nontender, nondistended, bowel sounds present  Extremities: No peripheral edema.  Neurologic: Awake, alert, following commands  Skin: No lesions       Basic Metabolic Panel: Recent Labs  Lab 07/05/19 0547 07/05/19 1421 07/06/19 0626 07/06/19 0626 07/06/19 1819 07/06/19 1819 07/07/19 0601 07/08/19 0258 07/09/19 0656  NA   < >  --  138  --  134*  --  136 138 140  K   < > 3.5 3.3*  --  2.8*  --  3.2* 3.5 4.0  CL   < >  --  101  --  101  --  102 105 106  CO2   < >  --  25  --  22  --  24 24 25   GLUCOSE   < >  --  132*  --  151*  --  98 111* 104*  BUN   < >  --  11  --  10  --  13 15 19   CREATININE   < >  --  1.22*  --  1.15*  --  1.37* 1.22* 1.23*  CALCIUM   < >  --  8.8*   < > 8.7*   < > 8.8* 8.6* 8.6*  MG  --   --   --   --  1.1*  --  1.4* 1.9  --   PHOS  --  1.5* 5.9*  --  3.3  --  4.1 3.1  --    < > = values in this interval not displayed.    Liver Function Tests: Recent Labs  Lab 07/04/19 0538 07/05/19 0547 07/06/19 0626  ALBUMIN 3.2* 2.9* 3.4*   No results for input(s): LIPASE, AMYLASE in the  last 168 hours. No results for input(s): AMMONIA in the last 168 hours.  CBC: Recent Labs  Lab 07/03/19 0511 07/07/19 0601 07/08/19 0258 07/09/19 0656  WBC 8.4 13.8* 13.3* 11.8*  HGB 10.8* 11.6* 10.8* 11.2*  HCT 30.0* 33.4* 31.2* 32.7*  MCV 81.3 83.5 83.0 84.5  PLT 499* 522* 505* 503*    Cardiac Enzymes: No results for input(s): CKTOTAL, CKMB, CKMBINDEX, TROPONINI in the last 168 hours.  BNP: Invalid input(s): POCBNP  CBG: No results for input(s): GLUCAP in the last 168 hours.  Microbiology: Results for orders placed or performed during the hospital encounter of 07/01/19  SARS CORONAVIRUS 2 (TAT 6-24 HRS) Nasopharyngeal Nasopharyngeal Swab     Status: None   Collection Time: 07/01/19  3:05 PM   Specimen: Nasopharyngeal Swab  Result Value Ref Range Status   SARS Coronavirus 2 NEGATIVE  NEGATIVE Final    Comment: (NOTE) SARS-CoV-2 target nucleic acids are NOT DETECTED. The SARS-CoV-2 RNA is generally detectable in upper and lower respiratory specimens during the acute phase of infection. Negative results do not preclude SARS-CoV-2 infection, do not rule out co-infections with other pathogens, and should not be used as the sole basis for treatment or other patient management decisions. Negative results must be combined with clinical observations, patient history, and epidemiological information. The expected result is Negative. Fact Sheet for Patients: SugarRoll.be Fact Sheet for Healthcare Providers: https://www.woods-mathews.com/ This test is not yet approved or cleared by the [place] FDA and  has been authorized for detection and/or diagnosis of SARS-CoV-2 by FDA under an Emergency Use Authorization (EUA). This EUA will remain  in effect (meaning this test can be used) for the duration of the COVID-19 declaration under Section 56 4(b)(1) of the Act, 21 U.S.C. section 360bbb-3(b)(1), unless the authorization is terminated  or revoked sooner. Performed at [facility], [place] [address] [place], [place] [address]   CULTURE, BLOOD (ROUTINE X 2) w Reflex to ID Panel     Status: None   Collection Time: 07/01/19  6:42 PM   Specimen: BLOOD  Result Value Ref Range Status   Specimen Description BLOOD RIGHT ANTECUBITAL  Final   Special Requests   Final    BOTTLES DRAWN AEROBIC AND ANAEROBIC Blood Culture adequate volume   Culture   Final    NO GROWTH 5 DAYS Performed at [facility], [address] [place], [place] [address]    Report Status 07/06/2019 FINAL  Final  CULTURE, BLOOD (ROUTINE X 2) w Reflex to ID Panel     Status: None   Collection Time: 07/01/19  6:54 PM   Specimen: BLOOD  Result Value Ref Range Status   Specimen Description BLOOD BLOOD RIGHT HAND  Final   Special Requests   Final    BOTTLES DRAWN AEROBIC AND ANAEROBIC Blood Culture adequate volume   Culture   Final    NO GROWTH 5 DAYS Performed at [facility], [address], [place], [place] [address]    Report Status 07/06/2019 FINAL  Final  C Difficile Quick Screen w PCR reflex     Status: Abnormal   Collection Time: 07/07/19  4:18 PM  Result Value Ref Range Status   C Diff antigen POSITIVE (A) NEGATIVE Final   C Diff toxin NEGATIVE NEGATIVE Final   C Diff interpretation Results are indeterminate. See PCR results.  Final    Comment: Performed at [facility], [address] [place], [place] [address]  Gastrointestinal Panel by PCR , Stool     Status: None   Collection Time: 07/07/19  4:18 PM  Result Value Ref Range Status   Campylobacter species NOT DETECTED NOT DETECTED Final   Plesimonas shigelloides NOT DETECTED NOT DETECTED Final   Salmonella species NOT DETECTED NOT DETECTED Final   Yersinia enterocolitica NOT DETECTED NOT DETECTED Final   Vibrio species NOT DETECTED NOT DETECTED Final   Vibrio cholerae NOT DETECTED NOT DETECTED Final   Enteroaggregative E coli (EAEC) NOT DETECTED NOT  DETECTED Final   Enteropathogenic E coli (EPEC) NOT DETECTED NOT DETECTED Final   Enterotoxigenic E coli (ETEC) NOT DETECTED NOT DETECTED Final   Shiga like toxin producing E coli (STEC) NOT DETECTED NOT DETECTED Final   Shigella/Enteroinvasive E coli (EIEC) NOT DETECTED NOT DETECTED Final   Cryptosporidium NOT DETECTED NOT DETECTED Final   Cyclospora cayetanensis NOT  DETECTED NOT DETECTED Final   Entamoeba histolytica NOT DETECTED NOT DETECTED Final   Giardia lamblia NOT DETECTED NOT DETECTED Final   Adenovirus F40/41 NOT DETECTED NOT DETECTED Final   Astrovirus NOT DETECTED NOT DETECTED Final   Norovirus GI/GII NOT DETECTED NOT DETECTED Final   Rotavirus A NOT DETECTED NOT DETECTED Final   Sapovirus (I, II, IV, and V) NOT DETECTED NOT DETECTED Final    Comment: Performed at [facility], [address], [place], [place] [address]  C. Diff by PCR, Reflexed     Status: None   Collection Time: 07/07/19  4:18 PM  Result Value Ref Range Status   Toxigenic C. Difficile by PCR NEGATIVE NEGATIVE Final    Comment: Patient is colonized with non toxigenic C. difficile. May not need treatment unless significant symptoms are present. Performed at [facility], [address] [place], [place] [address]     Coagulation Studies: No results for input(s): LABPROT, INR in the last 72 hours.  Urinalysis: No results for input(s): COLORURINE, LABSPEC, PHURINE, GLUCOSEU, HGBUR, BILIRUBINUR, KETONESUR, PROTEINUR, UROBILINOGEN, NITRITE, LEUKOCYTESUR in the last 72 hours.  Invalid input(s): APPERANCEUR    Imaging: No results found.   Medications:   . sodium chloride Stopped (07/05/19 0932)  . sodium chloride     . amitriptyline  75 mg Oral QHS  . aspirin EC  81 mg Oral Daily  . atorvastatin  20 mg Oral Daily  . B-complex with vitamin C  1 tablet Oral Daily  . dicyclomine  20 mg Oral TID AC & HS  . DULoxetine  60 mg Oral Daily  . enoxaparin (LOVENOX) injection  40 mg  Subcutaneous Q24H  . feeding supplement (ENSURE ENLIVE)  237 mL Oral BID BM  . folic acid  1 mg Oral Daily  . multivitamin with minerals  1 tablet Oral Daily  . octreotide  100 mcg Subcutaneous Q8H  . pantoprazole  40 mg Oral Daily  . sodium chloride flush  10-40 mL Intracatheter Q12H  . vitamin B-12  1,000 mcg Oral Daily  . Vitamin D (Ergocalciferol)  50,000 Units Oral Q7 days   sodium chloride, acetaminophen, ALPRAZolam, diphenoxylate-atropine, gabapentin, hydrOXYzine, ondansetron (ZOFRAN) IV, oxyCODONE, promethazine **OR** promethazine **OR** promethazine, sodium chloride flush, traMADol  Assessment/ Plan:  34 y.o. female  Molly Cisneros is a 34 y.o. white female with chronic diarrhea since April 2020, hypertension, hypokalemia, hyperlipidemia, anxiety, who was admitted to [facility] on 07/01/2019 for Dehydration [E86.0] Hyponatremia [E87.1] Chronic diarrhea [K52.9] Syncope, unspecified syncope type [R55]   1.  Acute kidney injury likely secondary to diarrhea and prolonged volume depletion. Baseline creatinine 0.93. normal GFR 2.  Hypokalemia due to diarrhea. 3.  Metabolic acidosis: anion gap closed.  4.  Chronic diarrhea    Plan:  Improved with octreotide Continue PO intake.    LOS: 8 Molly Cisneros 2/20/20211:28 PM

## 2019-07-10 LAB — BASIC METABOLIC PANEL
Anion gap: 10 (ref 5–15)
BUN: 21 mg/dL — ABNORMAL HIGH (ref 6–20)
CO2: 24 mmol/L (ref 22–32)
Calcium: 8.5 mg/dL — ABNORMAL LOW (ref 8.9–10.3)
Chloride: 105 mmol/L (ref 98–111)
Creatinine, Ser: 1.1 mg/dL — ABNORMAL HIGH (ref 0.44–1.00)
GFR calc Af Amer: >60 mL/min (ref >60)
GFR calc non Af Amer: >60 mL/min (ref >60)
Glucose, Bld: 97 mg/dL (ref 70–99)
Potassium: 3.6 mmol/L (ref 3.5–5.1)
Sodium: 139 mmol/L (ref 135–145)

## 2019-07-10 LAB — PROCALCITONIN: Procalcitonin: <0.1 ng/mL

## 2019-07-10 MED ORDER — DIPHENHYDRAMINE HCL 25 MG PO CAPS
25.0000 mg | ORAL_CAPSULE | Freq: Four times a day (QID) | ORAL | Status: DC | PRN
  Administered 2019-07-10: 25 mg via ORAL
  Filled 2019-07-10: qty 1

## 2019-07-10 MED ORDER — POTASSIUM CHLORIDE IN NACL 20-0.9 MEQ/L-% IV SOLN
INTRAVENOUS | Status: DC
  Filled 2019-07-10 (×4): qty 1000

## 2019-07-10 MED ORDER — POTASSIUM CHLORIDE CRYS ER 20 MEQ PO TBCR
40.0000 meq | EXTENDED_RELEASE_TABLET | Freq: Once | ORAL | Status: AC
  Administered 2019-07-10: 40 meq via ORAL
  Filled 2019-07-10: qty 2

## 2019-07-10 NOTE — Progress Notes (Signed)
PROGRESS NOTE    Molly Cisneros  [id number] DOB: 1985-11-12 DOA: 07/01/2019 PCP: Glean Hess, MD   Brief Narrative:  Molly Cisneros is a77 y.o.F with chronic diarrhea, HTN, hx Cdiff and mood disorder who presented to her oncologist's office with few days worsening of her chronic abdominal pain and diarrhea and was sent to the ER.  In the ER, BP 89/60, patient pale and syncopized at the triage desk. WBC 12K, K 2.1. Na 118, Cr 1.53 (from baseline 0.9).   Assessment & Plan:   Principal Problem:   Chronic diarrhea of unknown origin Active Problems:   Essential hypertension   Hyperlipidemia, mixed   Hypokalemia   Hyponatremia   Sepsis (HCC)   AKI (acute kidney injury) ([place])   GERD (gastroesophageal reflux disease)   Depression with anxiety   Severe chronic diarrhea causing severe symptomatic hypokalemia, hyponatremia, and acute kidney injury Patient was admitted and started on octreotide, IV fluids, home dicyclomine.  Diarrhea still persisting with multiple episodes again overnight and this morning -Continue octreotide -Restarted fluids at 100 mL/h on Saturday we will continue -continue oral diet -Recheck electrolytes in the morning -Pressure still soft with a 95 systolic this morning Subjectively patient reports she is feels worse today than yesterday  Hypokalemia Admitted with severe hypokalemia 2.1. Mag normal. s/p K supplemented aggressively. K 2.1 > 2.9 > 3.1 > 2.8 > 3.2 > 3.3 > 3.2>3.5>4>3.6 today Added potassium to fluids Monitor potassium daily and replete accordingly  Hyponatremiadue to severe diarrhea Na 124=>138 >134>136>138>140resolved   Hypomagnesemia, magnesium repleted IV. Continue to monitor and replete as needed. Mag 1.9   Acute kidney injury Non-gap metabolic acidosis Cr [id number] at admission (baseline 0.9). Likely hypovolemic injury. -Continue IV fluids, Cr 1.22 >1.37>1.22>1.23>1.1 today -ConsultedNephrology, appreciate their  input  Hypertension Chart history, not on meds.   Patient hypotensive Continue with current fluids, Cancel discharge Monitor and additional day with fluid resuscitation  Other medications -Continue amitriptyline -Continue atorvastatin -Continue duloxetine -Hold pantoprazole for now  Anemia This is likely all dilutional.  Mood disorder -Hold Adderall  Thrombocytosis,elevated platelet count, recommend to follow with hematologist as an outpatient for further work-up Started aspirin 81 mg p.o. daily and PPI for GI prophylaxis  Leukocytosisnoticed on 07/07/2019 WBC count 13.8>13.3>11.8  Patient remained afebrile, procalcitonin negative most likely reactive leukocytosis Continue to monitor daily  DVT prophylaxis: Lovenox SQ  Code Status: Full code    Code Status Orders  (From admission, onward)         Start     Ordered   07/01/19 1836  Full code  Continuous     07/01/19 1836        Code Status History    Date Active Date Inactive Code Status Order ID Comments User Context   06/11/2019 1431 06/16/2019 2206 Full Code [id number]  Para Skeans, MD ED   02/13/2019 0034 02/18/2019 1741 Full Code [id number]  Mansy, Arvella Merles, MD ED   02/05/2019 2230 02/09/2019 1747 Full Code [id number]  Lance Coon, MD Inpatient   11/06/2018 1742 11/06/2018 2253 Full Code [id number]  Herbert Pun, MD ED   Advance Care Planning Activity     Family Communication: None today Disposition Plan:   Patient will remain inpatient, requires continued IV fluid resuscitation, IV antiemetics.  Patient not yet stable for discharge Consults called: None Admission status: Inpatient   Consultants:   Nephrology  Procedures:  MR ENTERO ABDOMEN W WO CONTRAST  Result Date: 06/28/2019 CLINICAL DATA:  Diarrhea, weight loss,  epigastric pain, recent appendectomy EXAM: MR ABDOMEN AND PELVIS WITHOUT AND WITH CONTRAST (MR ENTEROGRAPHY) TECHNIQUE: Multiplanar, multisequence MRI of the abdomen and pelvis  was performed both before and during bolus administration of intravenous contrast. Negative oral contrast VoLumen was given. CONTRAST:  65mL GADAVIST GADOBUTROL 1 MMOL/ML IV SOLN COMPARISON:  CT abdomen/pelvis dated 02/13/2019 FINDINGS: Lower chest: Not visualized Hepatobiliary: Visualized liver is within normal limits. Gallbladder is unremarkable. No intrahepatic or extrahepatic ductal dilatation. No choledocholithiasis is seen. Pancreas:  Within normal limits. Spleen:  Within normal limits. Adrenals/Urinary Tract:  Adrenal glands are within normal limits. Kidneys are within normal limits.  No hydronephrosis. Bladder is within normal limits. Stomach/Bowel: Stomach is within normal limits. No evidence of bowel obstruction. No small bowel wall thickening, mass, or abnormality is seen. Specifically, the mesenteric stranding/inflammatory changes involving a loop of jejunum in the left mid abdomen has essentially resolved. Prior appendectomy. No colonic wall thickening or mass is evident. Vascular/Lymphatic:  No evidence of abdominal aortic aneurysm. No suspicious abdominopelvic lymphadenopathy. Reproductive: Uterus is within normal limits. Left ovary is within normal limits.  No right adnexal mass. Other:  No abdominopelvic ascites. Musculoskeletal: No focal osseous lesions. IMPRESSION: No small bowel wall thickening, mass, or abnormality is seen. Specifically, the prior mesenteric stranding/inflammatory changes involving a loop of jejunum in the left mid abdomen has essentially resolved. No colonic wall thickening or mass is evident. No evidence of bowel obstruction.  Prior appendectomy. Electronically Signed   By: Julian Hy M.D.   On: 06/28/2019 11:29   MR ENTERO PELVIS W WO CONTRAST  Result Date: 06/28/2019 CLINICAL DATA:  Diarrhea, weight loss, epigastric pain, recent appendectomy EXAM: MR ABDOMEN AND PELVIS WITHOUT AND WITH CONTRAST (MR ENTEROGRAPHY) TECHNIQUE: Multiplanar, multisequence MRI of the  abdomen and pelvis was performed both before and during bolus administration of intravenous contrast. Negative oral contrast VoLumen was given. CONTRAST:  75mL GADAVIST GADOBUTROL 1 MMOL/ML IV SOLN COMPARISON:  CT abdomen/pelvis dated 02/13/2019 FINDINGS: Lower chest: Not visualized Hepatobiliary: Visualized liver is within normal limits. Gallbladder is unremarkable. No intrahepatic or extrahepatic ductal dilatation. No choledocholithiasis is seen. Pancreas:  Within normal limits. Spleen:  Within normal limits. Adrenals/Urinary Tract:  Adrenal glands are within normal limits. Kidneys are within normal limits.  No hydronephrosis. Bladder is within normal limits. Stomach/Bowel: Stomach is within normal limits. No evidence of bowel obstruction. No small bowel wall thickening, mass, or abnormality is seen. Specifically, the mesenteric stranding/inflammatory changes involving a loop of jejunum in the left mid abdomen has essentially resolved. Prior appendectomy. No colonic wall thickening or mass is evident. Vascular/Lymphatic:  No evidence of abdominal aortic aneurysm. No suspicious abdominopelvic lymphadenopathy. Reproductive: Uterus is within normal limits. Left ovary is within normal limits.  No right adnexal mass. Other:  No abdominopelvic ascites. Musculoskeletal: No focal osseous lesions. IMPRESSION: No small bowel wall thickening, mass, or abnormality is seen. Specifically, the prior mesenteric stranding/inflammatory changes involving a loop of jejunum in the left mid abdomen has essentially resolved. No colonic wall thickening or mass is evident. No evidence of bowel obstruction.  Prior appendectomy. Electronically Signed   By: Julian Hy M.D.   On: 06/28/2019 11:29     Antimicrobials:   None   Subjective: Patient reports she feels worse today decreased energy nausea and persistent diarrhea  Objective: Vitals:   07/09/19 1008 07/09/19 1402 07/09/19 2037 07/10/19 0506  BP: 103/71 100/70  104/69 95/61  Pulse: 100 (!) 102 (!) 110 93  Resp:  18 20 20  Temp: 97.9 F (36.6 C) 98.2 F (36.8 C) 98.6 F (37 C) 98.2 F (36.8 C)  TempSrc: Oral Oral Oral Oral  SpO2: 98% 100% 98% 98%  Weight:      Height:        Intake/Output Summary (Last 24 hours) at 07/10/2019 1119 Last data filed at 07/10/2019 0647 Gross per 24 hour  Intake 1285.11 ml  Output --  Net 1285.11 ml   Filed Weights   07/01/19 1240  Weight: 97.7 kg    Examination:  General exam: Appears calm and comfortable  Respiratory system: Clear to auscultation. Respiratory effort normal. Cardiovascular system: S1 & S2 heard, RRR. No JVD, murmurs, rubs, gallops or clicks. No pedal edema. Gastrointestinal system: Abdomen is nondistended, soft and mildly tender but nonfocal. No organomegaly or masses felt.  Quiet bowel sounds heard. Central nervous system: Alert and oriented. No focal neurological deficits. Extremities: Warm well perfused, neurovascularly intact Skin: No rashes, lesions or ulcers Psychiatry: Judgement and insight appear normal. Mood & affect appropriate.     Data Reviewed: I have personally reviewed following labs and imaging studies  CBC: Recent Labs  Lab 07/07/19 0601 07/08/19 0258 07/09/19 0656  WBC 13.8* 13.3* 11.8*  HGB 11.6* 10.8* 11.2*  HCT 33.4* 31.2* 32.7*  MCV 83.5 83.0 84.5  PLT 522* 505* [id number]*   Basic Metabolic Panel: Recent Labs  Lab 07/05/19 0547 07/05/19 1421 07/06/19 0626 07/06/19 0626 07/06/19 1819 07/07/19 0601 07/08/19 0258 07/09/19 0656 07/10/19 0425  NA   < >  --  138   < > 134* 136 138 140 139  K   < > 3.5 3.3*   < > 2.8* 3.2* 3.5 4.0 3.6  CL   < >  --  101   < > 101 102 105 106 105  CO2   < >  --  25   < > 22 24 24 25 24   GLUCOSE   < >  --  132*   < > 151* 98 111* 104* 97  BUN   < >  --  11   < > 10 13 15 19  21*  CREATININE   < >  --  1.22*   < > 1.15* 1.37* 1.22* 1.23* 1.10*  CALCIUM   < >  --  8.8*   < > 8.7* 8.8* 8.6* 8.6* 8.5*  MG  --   --   --    --  1.1* 1.4* 1.9  --   --   PHOS  --  1.5* 5.9*  --  3.3 4.1 3.1  --   --    < > = values in this interval not displayed.   GFR: Estimated Creatinine Clearance: 92 mL/min (A) (by C-G formula based on SCr of 1.1 mg/dL (H)). Liver Function Tests: Recent Labs  Lab 07/04/19 0538 07/05/19 0547 07/06/19 0626  ALBUMIN 3.2* 2.9* 3.4*   No results for input(s): LIPASE, AMYLASE in the last 168 hours. No results for input(s): AMMONIA in the last 168 hours. Coagulation Profile: No results for input(s): INR, PROTIME in the last 168 hours. Cardiac Enzymes: No results for input(s): CKTOTAL, CKMB, CKMBINDEX, TROPONINI in the last 168 hours. BNP (last 3 results) No results for input(s): PROBNP in the last 8760 hours. HbA1C: No results for input(s): HGBA1C in the last 72 hours. CBG: No results for input(s): GLUCAP in the last 168 hours. Lipid Profile: No results for input(s): CHOL, HDL, LDLCALC, TRIG, CHOLHDL, LDLDIRECT in the last 72 hours. Thyroid  Function Tests: No results for input(s): TSH, T4TOTAL, FREET4, T3FREE, THYROIDAB in the last 72 hours. Anemia Panel: No results for input(s): VITAMINB12, FOLATE, FERRITIN, TIBC, IRON, RETICCTPCT in the last 72 hours. Sepsis Labs: Recent Labs  Lab 07/08/19 0258 07/09/19 0656 07/10/19 0425  PROCALCITON <0.10 <0.10 <0.10    Recent Results (from the past 240 hour(s))  SARS CORONAVIRUS 2 (TAT 6-24 HRS) Nasopharyngeal Nasopharyngeal Swab     Status: None   Collection Time: 07/01/19  3:05 PM   Specimen: Nasopharyngeal Swab  Result Value Ref Range Status   SARS Coronavirus 2 NEGATIVE NEGATIVE Final    Comment: (NOTE) SARS-CoV-2 target nucleic acids are NOT DETECTED. The SARS-CoV-2 RNA is generally detectable in upper and lower respiratory specimens during the acute phase of infection. Negative results do not preclude SARS-CoV-2 infection, do not rule out co-infections with other pathogens, and should not be used as the sole basis for treatment  or other patient management decisions. Negative results must be combined with clinical observations, patient history, and epidemiological information. The expected result is Negative. Fact Sheet for Patients: SugarRoll.be Fact Sheet for Healthcare Providers: https://www.woods-mathews.com/ This test is not yet approved or cleared by the [place] FDA and  has been authorized for detection and/or diagnosis of SARS-CoV-2 by FDA under an Emergency Use Authorization (EUA). This EUA will remain  in effect (meaning this test can be used) for the duration of the COVID-19 declaration under Section 56 4(b)(1) of the Act, 21 U.S.C. section 360bbb-3(b)(1), unless the authorization is terminated or revoked sooner. Performed at [facility], [place] [address], [place], [place] [address]   CULTURE, BLOOD (ROUTINE X 2) w Reflex to ID Panel     Status: None   Collection Time: 07/01/19  6:42 PM   Specimen: BLOOD  Result Value Ref Range Status   Specimen Description BLOOD RIGHT ANTECUBITAL  Final   Special Requests   Final    BOTTLES DRAWN AEROBIC AND ANAEROBIC Blood Culture adequate volume   Culture   Final    NO GROWTH 5 DAYS Performed at [facility], [address] [place], [place] [address]    Report Status 07/06/2019 FINAL  Final  CULTURE, BLOOD (ROUTINE X 2) w Reflex to ID Panel     Status: None   Collection Time: 07/01/19  6:54 PM   Specimen: BLOOD  Result Value Ref Range Status   Specimen Description BLOOD BLOOD RIGHT HAND  Final   Special Requests   Final    BOTTLES DRAWN AEROBIC AND ANAEROBIC Blood Culture adequate volume   Culture   Final    NO GROWTH 5 DAYS Performed at [facility], [address] [place], [place] [address]    Report Status 07/06/2019 FINAL  Final  C Difficile Quick Screen w PCR reflex     Status: Abnormal   Collection Time: 07/07/19  4:18 PM  Result Value Ref Range Status   C Diff antigen  POSITIVE (A) NEGATIVE Final   C Diff toxin NEGATIVE NEGATIVE Final   C Diff interpretation Results are indeterminate. See PCR results.  Final    Comment: Performed at [facility], [address] [place], [place] [address]  Gastrointestinal Panel by PCR , Stool     Status: None   Collection Time: 07/07/19  4:18 PM  Result Value Ref Range Status   Campylobacter species NOT DETECTED NOT DETECTED Final   Plesimonas shigelloides NOT DETECTED NOT DETECTED Final   Salmonella species NOT DETECTED  NOT DETECTED Final   Yersinia enterocolitica NOT DETECTED NOT DETECTED Final   Vibrio species NOT DETECTED NOT DETECTED Final   Vibrio cholerae NOT DETECTED NOT DETECTED Final   Enteroaggregative E coli (EAEC) NOT DETECTED NOT DETECTED Final   Enteropathogenic E coli (EPEC) NOT DETECTED NOT DETECTED Final   Enterotoxigenic E coli (ETEC) NOT DETECTED NOT DETECTED Final   Shiga like toxin producing E coli (STEC) NOT DETECTED NOT DETECTED Final   Shigella/Enteroinvasive E coli (EIEC) NOT DETECTED NOT DETECTED Final   Cryptosporidium NOT DETECTED NOT DETECTED Final   Cyclospora cayetanensis NOT DETECTED NOT DETECTED Final   Entamoeba histolytica NOT DETECTED NOT DETECTED Final   Giardia lamblia NOT DETECTED NOT DETECTED Final   Adenovirus F40/41 NOT DETECTED NOT DETECTED Final   Astrovirus NOT DETECTED NOT DETECTED Final   Norovirus GI/GII NOT DETECTED NOT DETECTED Final   Rotavirus A NOT DETECTED NOT DETECTED Final   Sapovirus (I, II, IV, and V) NOT DETECTED NOT DETECTED Final    Comment: Performed at [facility], [address] [place], [place] [address]  C. Diff by PCR, Reflexed     Status: None   Collection Time: 07/07/19  4:18 PM  Result Value Ref Range Status   Toxigenic C. Difficile by PCR NEGATIVE NEGATIVE Final    Comment: Patient is colonized with non toxigenic C. difficile. May not need treatment unless significant symptoms are present. Performed at [facility], [address] [place], [place] [address]          Radiology Studies: No results found.      Scheduled Meds: . amitriptyline  75 mg Oral QHS  . aspirin EC  81 mg Oral Daily  . atorvastatin  20 mg Oral Daily  . B-complex with vitamin C  1 tablet Oral Daily  . dicyclomine  20 mg Oral TID AC & HS  . DULoxetine  60 mg Oral Daily  . enoxaparin (LOVENOX) injection  40 mg Subcutaneous Q24H  . feeding supplement (ENSURE ENLIVE)  237 mL Oral BID BM  . folic acid  1 mg Oral Daily  . multivitamin with minerals  1 tablet Oral Daily  . octreotide  100 mcg Subcutaneous Q8H  . pantoprazole  40 mg Oral Daily  . potassium chloride  40 mEq Oral Once  . sodium chloride flush  10-40 mL Intracatheter Q12H  . vitamin B-12  1,000 mcg Oral Daily  . Vitamin D (Ergocalciferol)  50,000 Units Oral Q7 days   Continuous Infusions: . sodium chloride Stopped (07/05/19 0932)  . 0.9 % NaCl with KCl 20 mEq / L       LOS: 9 days    Time spent: 35 minutes    Nicolette Bang, MD [facility]  If 7PM-7AM, please contact night-coverage  07/10/2019, 11:19 AM

## 2019-07-10 NOTE — Progress Notes (Signed)
[facility]  ROUNDING NOTE   Subjective:   Continues to have diarrhea and headache. However she states she is subjectively better.   Objective:  Vital signs in last 24 hours:  Temp:  [98.2 F (36.8 C)-98.6 F (37 C)] 98.2 F (36.8 C) (02/21 0506) Pulse Rate:  [93-110] 93 (02/21 0506) Resp:  [18-20] 20 (02/21 0506) BP: (95-104)/(61-70) 95/61 (02/21 0506) SpO2:  [98 %-100 %] 98 % (02/21 0506)  Weight change:  Filed Weights   07/01/19 1240  Weight: 97.7 kg    Intake/Output: I/O last 3 completed shifts: In: 1535.1 [P.O.:360; I.V.:1175.1] Out: -    Intake/Output this shift:  No intake/output data recorded.  Physical Exam: General: No acute distress, laying in bed  Head: Normocephalic, atraumatic. Moist oral mucosal membranes  Eyes: Anicteric  Neck: Supple, trachea midline  Lungs:  Clear to auscultation   Heart: regular  Abdomen:  Soft, nontender, nondistended, bowel sounds present  Extremities: No peripheral edema.  Neurologic: Awake, alert, following commands  Skin: No lesions       Basic Metabolic Panel: Recent Labs  Lab 07/05/19 0547 07/05/19 1421 07/06/19 0626 07/06/19 0626 07/06/19 1819 07/06/19 1819 07/07/19 0601 07/07/19 0601 07/08/19 0258 07/09/19 0656 07/10/19 0425  NA   < >  --  138   < > 134*  --  136  --  138 140 139  K   < > 3.5 3.3*   < > 2.8*  --  3.2*  --  3.5 4.0 3.6  CL   < >  --  101   < > 101  --  102  --  105 106 105  CO2   < >  --  25   < > 22  --  24  --  24 25 24   GLUCOSE   < >  --  132*   < > 151*  --  98  --  111* 104* 97  BUN   < >  --  11   < > 10  --  13  --  15 19 21*  CREATININE   < >  --  1.22*   < > 1.15*  --  1.37*  --  1.22* 1.23* 1.10*  CALCIUM   < >  --  8.8*   < > 8.7*   < > 8.8*   < > 8.6* 8.6* 8.5*  MG  --   --   --   --  1.1*  --  1.4*  --  1.9  --   --   PHOS  --  1.5* 5.9*  --  3.3  --  4.1  --  3.1  --   --    < > = values in this interval not displayed.    Liver Function Tests: Recent Labs   Lab 07/04/19 0538 07/05/19 0547 07/06/19 0626  ALBUMIN 3.2* 2.9* 3.4*   No results for input(s): LIPASE, AMYLASE in the last 168 hours. No results for input(s): AMMONIA in the last 168 hours.  CBC: Recent Labs  Lab 07/07/19 0601 07/08/19 0258 07/09/19 0656  WBC 13.8* 13.3* 11.8*  HGB 11.6* 10.8* 11.2*  HCT 33.4* 31.2* 32.7*  MCV 83.5 83.0 84.5  PLT 522* 505* 503*    Cardiac Enzymes: No results for input(s): CKTOTAL, CKMB, CKMBINDEX, TROPONINI in the last 168 hours.  BNP: Invalid input(s): POCBNP  CBG: No results for input(s): GLUCAP in the last 168 hours.  Microbiology: Results for orders placed  or performed during the hospital encounter of 07/01/19  SARS CORONAVIRUS 2 (TAT 6-24 HRS) Nasopharyngeal Nasopharyngeal Swab     Status: None   Collection Time: 07/01/19  3:05 PM   Specimen: Nasopharyngeal Swab  Result Value Ref Range Status   SARS Coronavirus 2 NEGATIVE NEGATIVE Final    Comment: (NOTE) SARS-CoV-2 target nucleic acids are NOT DETECTED. The SARS-CoV-2 RNA is generally detectable in upper and lower respiratory specimens during the acute phase of infection. Negative results do not preclude SARS-CoV-2 infection, do not rule out co-infections with other pathogens, and should not be used as the sole basis for treatment or other patient management decisions. Negative results must be combined with clinical observations, patient history, and epidemiological information. The expected result is Negative. Fact Sheet for Patients: SugarRoll.be Fact Sheet for Healthcare Providers: https://www.woods-mathews.com/ This test is not yet approved or cleared by the [place] FDA and  has been authorized for detection and/or diagnosis of SARS-CoV-2 by FDA under an Emergency Use Authorization (EUA). This EUA will remain  in effect (meaning this test can be used) for the duration of the COVID-19 declaration under Section 56  4(b)(1) of the Act, 21 U.S.C. section 360bbb-3(b)(1), unless the authorization is terminated or revoked sooner. Performed at [facility], [place] [address], [place], [place] [address]   CULTURE, BLOOD (ROUTINE X 2) w Reflex to ID Panel     Status: None   Collection Time: 07/01/19  6:42 PM   Specimen: BLOOD  Result Value Ref Range Status   Specimen Description BLOOD RIGHT ANTECUBITAL  Final   Special Requests   Final    BOTTLES DRAWN AEROBIC AND ANAEROBIC Blood Culture adequate volume   Culture   Final    NO GROWTH 5 DAYS Performed at [facility], [address] [place], [place] [address]    Report Status 07/06/2019 FINAL  Final  CULTURE, BLOOD (ROUTINE X 2) w Reflex to ID Panel     Status: None   Collection Time: 07/01/19  6:54 PM   Specimen: BLOOD  Result Value Ref Range Status   Specimen Description BLOOD BLOOD RIGHT HAND  Final   Special Requests   Final    BOTTLES DRAWN AEROBIC AND ANAEROBIC Blood Culture adequate volume   Culture   Final    NO GROWTH 5 DAYS Performed at [facility], [address], [place], [place] [address]    Report Status 07/06/2019 FINAL  Final  C Difficile Quick Screen w PCR reflex     Status: Abnormal   Collection Time: 07/07/19  4:18 PM  Result Value Ref Range Status   C Diff antigen POSITIVE (A) NEGATIVE Final   C Diff toxin NEGATIVE NEGATIVE Final   C Diff interpretation Results are indeterminate. See PCR results.  Final    Comment: Performed at [facility], [address] [place],  [address]  Gastrointestinal Panel by PCR , Stool     Status: None   Collection Time: 07/07/19  4:18 PM  Result Value Ref Range Status   Campylobacter species NOT DETECTED NOT DETECTED Final   Plesimonas shigelloides NOT DETECTED NOT DETECTED Final   Salmonella species NOT DETECTED NOT DETECTED Final   Yersinia enterocolitica NOT DETECTED NOT DETECTED Final   Vibrio species NOT DETECTED NOT DETECTED Final   Vibrio  cholerae NOT DETECTED NOT DETECTED Final   Enteroaggregative E coli (EAEC) NOT DETECTED NOT DETECTED Final   Enteropathogenic E coli (EPEC) NOT DETECTED NOT DETECTED Final  Enterotoxigenic E coli (ETEC) NOT DETECTED NOT DETECTED Final   Shiga like toxin producing E coli (STEC) NOT DETECTED NOT DETECTED Final   Shigella/Enteroinvasive E coli (EIEC) NOT DETECTED NOT DETECTED Final   Cryptosporidium NOT DETECTED NOT DETECTED Final   Cyclospora cayetanensis NOT DETECTED NOT DETECTED Final   Entamoeba histolytica NOT DETECTED NOT DETECTED Final   Giardia lamblia NOT DETECTED NOT DETECTED Final   Adenovirus F40/41 NOT DETECTED NOT DETECTED Final   Astrovirus NOT DETECTED NOT DETECTED Final   Norovirus GI/GII NOT DETECTED NOT DETECTED Final   Rotavirus A NOT DETECTED NOT DETECTED Final   Sapovirus (I, II, IV, and V) NOT DETECTED NOT DETECTED Final    Comment: Performed at [facility], [address] [place], [place] [address]  C. Diff by PCR, Reflexed     Status: None   Collection Time: 07/07/19  4:18 PM  Result Value Ref Range Status   Toxigenic C. Difficile by PCR NEGATIVE NEGATIVE Final    Comment: Patient is colonized with non toxigenic C. difficile. May not need treatment unless significant symptoms are present. Performed at [facility], [address] [place], [place] [address]     Coagulation Studies: No results for input(s): LABPROT, INR in the last 72 hours.  Urinalysis: No results for input(s): COLORURINE, LABSPEC, PHURINE, GLUCOSEU, HGBUR, BILIRUBINUR, KETONESUR, PROTEINUR, UROBILINOGEN, NITRITE, LEUKOCYTESUR in the last 72 hours.  Invalid input(s): APPERANCEUR    Imaging: No results found.   Medications:   . sodium chloride Stopped (07/05/19 0932)  . 0.9 % NaCl with KCl 20 mEq / L     . amitriptyline  75 mg Oral QHS  . aspirin EC  81 mg Oral Daily  . atorvastatin  20 mg Oral Daily  . B-complex with vitamin C  1 tablet Oral Daily  .  dicyclomine  20 mg Oral TID AC & HS  . DULoxetine  60 mg Oral Daily  . enoxaparin (LOVENOX) injection  40 mg Subcutaneous Q24H  . feeding supplement (ENSURE ENLIVE)  237 mL Oral BID BM  . folic acid  1 mg Oral Daily  . multivitamin with minerals  1 tablet Oral Daily  . octreotide  100 mcg Subcutaneous Q8H  . pantoprazole  40 mg Oral Daily  . potassium chloride  40 mEq Oral Once  . sodium chloride flush  10-40 mL Intracatheter Q12H  . vitamin B-12  1,000 mcg Oral Daily  . Vitamin D (Ergocalciferol)  50,000 Units Oral Q7 days   sodium chloride, acetaminophen, ALPRAZolam, diphenoxylate-atropine, gabapentin, hydrOXYzine, ondansetron (ZOFRAN) IV, oxyCODONE, promethazine **OR** promethazine **OR** promethazine, sodium chloride flush, traMADol  Assessment/ Plan:    Ms. Molly Cisneros is a 34 y.o. white female with chronic diarrhea since April 2020, hypertension, hypokalemia, hyperlipidemia, anxiety, who was admitted to [facility] on 07/01/2019 for Dehydration [E86.0] Hyponatremia [E87.1] Chronic diarrhea [K52.9] Syncope, unspecified syncope type [R55]   1.  Acute kidney injury likely secondary to diarrhea and prolonged volume depletion. Baseline creatinine 0.93. normal GFR 2.  Hypokalemia due to diarrhea. 3.  Metabolic acidosis: anion gap closed.  4.  Chronic diarrhea    Plan:  Improved with octreotide Continue PO intake.  - change IV fluids to NS with 20 mEq KCl   LOS: 9 Carrington Mullenax 2/21/202111:10 AM

## 2019-07-11 ENCOUNTER — Other Ambulatory Visit: Payer: Self-pay | Admitting: Gastroenterology

## 2019-07-11 DIAGNOSIS — E559 Vitamin D deficiency, unspecified: Secondary | ICD-10-CM | POA: Diagnosis present

## 2019-07-11 LAB — BASIC METABOLIC PANEL
Anion gap: 7 (ref 5–15)
BUN: 19 mg/dL (ref 6–20)
CO2: 27 mmol/L (ref 22–32)
Calcium: 8.3 mg/dL — ABNORMAL LOW (ref 8.9–10.3)
Chloride: 106 mmol/L (ref 98–111)
Creatinine, Ser: 1.12 mg/dL — ABNORMAL HIGH (ref 0.44–1.00)
GFR calc Af Amer: >60 mL/min (ref >60)
GFR calc non Af Amer: >60 mL/min (ref >60)
Glucose, Bld: 90 mg/dL (ref 70–99)
Potassium: 3.9 mmol/L (ref 3.5–5.1)
Sodium: 140 mmol/L (ref 135–145)

## 2019-07-11 MED ORDER — FOLIC ACID 1 MG PO TABS: 1.0000 mg | ORAL_TABLET | Freq: Every day | ORAL | 0 refills | Status: DC

## 2019-07-11 MED ORDER — TRAMADOL HCL 50 MG PO TABS: 50.0000 mg | ORAL_TABLET | Freq: Four times a day (QID) | ORAL | 0 refills | Status: DC | PRN

## 2019-07-11 MED ORDER — VITAMIN D (ERGOCALCIFEROL) 1.25 MG (50000 UNIT) PO CAPS: 50000.0000 [IU] | ORAL_CAPSULE | ORAL | 0 refills | Status: AC

## 2019-07-11 MED ORDER — CYANOCOBALAMIN 1000 MCG PO TABS: 1000.0000 ug | ORAL_TABLET | Freq: Every day | ORAL | 0 refills | Status: DC

## 2019-07-11 MED ORDER — B COMPLEX-C PO TABS: 1.0000 | ORAL_TABLET | Freq: Every day | ORAL | 1 refills | Status: DC

## 2019-07-11 MED ORDER — DIPHENOXYLATE-ATROPINE 2.5-0.025 MG PO TABS: 2.0000 | ORAL_TABLET | Freq: Four times a day (QID) | ORAL | 0 refills | Status: DC | PRN

## 2019-07-11 MED ORDER — PROMETHAZINE HCL 25 MG PO TABS: 25.0000 mg | ORAL_TABLET | Freq: Three times a day (TID) | ORAL | 0 refills | Status: DC | PRN

## 2019-07-11 MED ORDER — ENSURE ENLIVE PO LIQD: 237.0000 mL | Freq: Two times a day (BID) | ORAL | 12 refills | Status: DC

## 2019-07-11 NOTE — Discharge Instructions (Signed)

## 2019-07-11 NOTE — Progress Notes (Signed)
Aster E Bell A and O x4. VSS. Pt tolerating diet well. No complaints of nausea or vomiting. IV removed intact, prescriptions given. Pt voices understanding of discharge instructions with no further questions. Patient discharged via wheelchair with SN  Allergies as of 07/11/2019      Reactions   Shellfish Allergy Anaphylaxis   Aloe Vera Dermatitis      Medication List    TAKE these medications   ALPRAZolam 1 MG tablet Commonly known as: XANAX Take 1 mg by mouth 3 (three) times daily as needed for anxiety.   amitriptyline 50 MG tablet Commonly known as: ELAVIL Take 50 mg by mouth at bedtime.   amphetamine-dextroamphetamine 20 MG tablet Commonly known as: ADDERALL Take 20 mg by mouth 2 (two) times daily.   amphetamine-dextroamphetamine 30 MG 24 hr capsule Commonly known as: ADDERALL XR Take 30 mg by mouth daily.   atorvastatin 20 MG tablet Commonly known as: LIPITOR TAKE (1) TABLET BY MOUTH EVERY DAY What changed: See the new instructions.   B-complex with vitamin C tablet Take 1 tablet by mouth daily.   cyanocobalamin 1000 MCG tablet Take 1 tablet (1,000 mcg total) by mouth daily.   dicyclomine 20 MG tablet Commonly known as: BENTYL Take 1 tablet (20 mg total) by mouth 3 (three) times daily before meals. What changed: when to take this   diphenoxylate-atropine 2.5-0.025 MG tablet Commonly known as: LOMOTIL Take 2 tablets by mouth 4 (four) times daily as needed for diarrhea or loose stools.   DULoxetine 60 MG capsule Commonly known as: CYMBALTA Take 60 mg by mouth daily.   feeding supplement (ENSURE ENLIVE) Liqd Take 237 mLs by mouth 2 (two) times daily between meals.   folic acid 1 MG tablet Commonly known as: FOLVITE Take 1 tablet (1 mg total) by mouth daily.   gabapentin 300 MG capsule Commonly known as: NEURONTIN TAKE (1) CAPSULE BY MOUTH TWICE DAILY What changed: See the new instructions.   hydrOXYzine 25 MG capsule Commonly known as: VISTARIL TAKE 1  TO 2 CAPSULES BY MOUTH EVERY 6 HOURS What changed: See the new instructions.   Octreotide Acetate 200 MCG/ML Soln Inject 0.5 mLs (100 mcg total) as directed every 8 (eight) hours as needed (diarrhea).   ondansetron 8 MG disintegrating tablet Commonly known as: ZOFRAN-ODT TAKE 1 TABLET BY MOUTH EVERY 8 HOURS AS NEEDED FOR NAUSEA OR VOMITING.   pantoprazole 40 MG tablet Commonly known as: PROTONIX TAKE ONE TABLET BY MOUTH TWICE DAILY. 30MINUTES PRIOR TO MEALS What changed: See the new instructions.   potassium chloride 20 MEQ/15ML (10%) Soln Take 15 mLs (20 mEq total) by mouth 2 (two) times daily.   promethazine 25 MG tablet Commonly known as: PHENERGAN Take 1 tablet (25 mg total) by mouth every 8 (eight) hours as needed for nausea or vomiting. What changed: when to take this   traMADol 50 MG tablet Commonly known as: ULTRAM Take 1 tablet (50 mg total) by mouth every 6 (six) hours as needed.   TUBERCULIN SYR 1CC/27GX1/2" 27G X 1/2" 1 ML Misc Use for octreotide injection every 8 hours as needed for diarrhea   valACYclovir 1000 MG tablet Commonly known as: VALTREX Take 1,000 mg by mouth 2 (two) times daily as needed.   Vitamin D (Ergocalciferol) 1.25 MG (50000 UNIT) Caps capsule Commonly known as: DRISDOL Take 1 capsule (50,000 Units total) by mouth every 7 (seven) days for 7 doses. Start taking on: July 14, 2019       Vitals:  07/11/19 0503 07/11/19 0800  BP: 117/77 117/80  Pulse: 91 89  Resp: 20 18  Temp: 98.2 F (36.8 C) 98.3 F (36.8 C)  SpO2: 100% 99%    Darnelle Catalan

## 2019-07-11 NOTE — Discharge Summary (Signed)
Physician Discharge Summary  Molly Cisneros [id number] DOB: 1985/09/29 DOA: 07/01/2019  PCP: Glean Hess, MD  Admit date: 07/01/2019 Discharge date: 07/11/2019  Admitted From:home Disposition:  home  Recommendations for Outpatient Follow-up:  1. Follow up with PCP in 1 week  Home Health: None Equipment/Devices: None  Discharge Condition: Fair CODE STATUS: Full code Diet recommendation: regular   Discharge Diagnoses:  Principal Problem:   Chronic diarrhea of unknown origin   Active Problems: Dehydration secondary to diarrhea.   Hypokalemia   AKI (acute kidney injury) ([place])   Essential hypertension   Hyperlipidemia, mixed   Hyponatremia   Sepsis ([place])   GERD (gastroesophageal reflux disease)   Depression with anxiety   Vitamin D deficiency   Brief narrative/HPI 34 year old female with history of ongoing diarrhea since April 2020, hypertension, anxiety and depression, history of C. difficile colitis, extensive work-up by GI with findings of elevated chromogranin level and negative overall work-up.  Patient followed by hematology Dr. Mike Gip with suspicion for neuroendocrine tumor and awaiting authorization for a PET scan.  She was seen in the office and found to have hypotension and sent to the ED for further management. Patient had prolonged hospitalization with ongoing diarrhea with severe symptomatic hypokalemia, hyponatremia and acute kidney injury.  Hospital course  Principal problem Severe chronic diarrhea with symptomatic electrolyte imbalance Had severe hypokalemia, hyponatremia and acute kidney injury.  Started on octreotide, IV fluids and resume home dicyclomine.  Stool for C. difficile negative. Prolonged hospitalization due to persistent diarrhea and electrolyte abnormality. Diarrhea now slowly improving (had one episode last night only).  Blood pressure stable. Resume Lomotil at home. Have prescribed tramadol as needed for pain (uses at home) and  Phenergan as needed for nausea. Added multivitamin.  Patient stable to be discharged home with outpatient follow-up  Acute problems Hypokalemia Potassium of 2.1 on presentation, now improved.  Received p.o. and IV potassium.  Resume home supplement.  Hyponatremia Sodium of 124 on presentation, now improved with fluids  Hypomagnesemia Replenished  Acute kidney injury Nonanion gap metabolic acidosis Prerenal AKI secondary to dehydration.  Resolved with fluids  Vitamin D deficiency Level of 8.  Prescribed ergocalciferol (50,000 unit) weekly for total 8 doses.  B12 deficiency On supplement  Hypotension Secondary to dehydration.  Improved with fluids  Disposition: Home Family communication: None    Discharge Instructions   Allergies as of 07/11/2019      Reactions   Shellfish Allergy Anaphylaxis   Aloe Vera Dermatitis      Medication List    TAKE these medications   ALPRAZolam 1 MG tablet Commonly known as: XANAX Take 1 mg by mouth 3 (three) times daily as needed for anxiety.   amitriptyline 50 MG tablet Commonly known as: ELAVIL Take 50 mg by mouth at bedtime.   amphetamine-dextroamphetamine 20 MG tablet Commonly known as: ADDERALL Take 20 mg by mouth 2 (two) times daily.   amphetamine-dextroamphetamine 30 MG 24 hr capsule Commonly known as: ADDERALL XR Take 30 mg by mouth daily.   atorvastatin 20 MG tablet Commonly known as: LIPITOR TAKE (1) TABLET BY MOUTH EVERY DAY What changed: See the new instructions.   B-complex with vitamin C tablet Take 1 tablet by mouth daily.   cyanocobalamin 1000 MCG tablet Take 1 tablet (1,000 mcg total) by mouth daily.   dicyclomine 20 MG tablet Commonly known as: BENTYL Take 1 tablet (20 mg total) by mouth 3 (three) times daily before meals. What changed: when to take this  diphenoxylate-atropine 2.5-0.025 MG tablet Commonly known as: LOMOTIL Take 2 tablets by mouth 4 (four) times daily as needed for diarrhea  or loose stools.   DULoxetine 60 MG capsule Commonly known as: CYMBALTA Take 60 mg by mouth daily.   feeding supplement (ENSURE ENLIVE) Liqd Take 237 mLs by mouth 2 (two) times daily between meals.   folic acid 1 MG tablet Commonly known as: FOLVITE Take 1 tablet (1 mg total) by mouth daily.   gabapentin 300 MG capsule Commonly known as: NEURONTIN TAKE (1) CAPSULE BY MOUTH TWICE DAILY What changed: See the new instructions.   hydrOXYzine 25 MG capsule Commonly known as: VISTARIL TAKE 1 TO 2 CAPSULES BY MOUTH EVERY 6 HOURS What changed: See the new instructions.   Octreotide Acetate 200 MCG/ML Soln Inject 0.5 mLs (100 mcg total) as directed every 8 (eight) hours as needed (diarrhea).   ondansetron 8 MG disintegrating tablet Commonly known as: ZOFRAN-ODT TAKE 1 TABLET BY MOUTH EVERY 8 HOURS AS NEEDED FOR NAUSEA OR VOMITING.   pantoprazole 40 MG tablet Commonly known as: PROTONIX TAKE ONE TABLET BY MOUTH TWICE DAILY. 30MINUTES PRIOR TO MEALS What changed: See the new instructions.   potassium chloride 20 MEQ/15ML (10%) Soln Take 15 mLs (20 mEq total) by mouth 2 (two) times daily.   promethazine 25 MG tablet Commonly known as: PHENERGAN Take 1 tablet (25 mg total) by mouth every 8 (eight) hours as needed for nausea or vomiting. What changed: when to take this   traMADol 50 MG tablet Commonly known as: ULTRAM Take 1 tablet (50 mg total) by mouth every 6 (six) hours as needed.   TUBERCULIN SYR 1CC/27GX1/2" 27G X 1/2" 1 ML Misc Use for octreotide injection every 8 hours as needed for diarrhea   valACYclovir 1000 MG tablet Commonly known as: VALTREX Take 1,000 mg by mouth 2 (two) times daily as needed.   Vitamin D (Ergocalciferol) 1.25 MG (50000 UNIT) Caps capsule Commonly known as: DRISDOL Take 1 capsule (50,000 Units total) by mouth every 7 (seven) days for 7 doses. Start taking on: July 14, 2019      Follow-up Information    Glean Hess, MD. Schedule  an appointment as soon as possible for a visit in 5 day(s).   Specialty: Internal Medicine Why: Please schedule follow up for 3-5 days after discharge.  Contact information: [address] [place] [address] [phone]          Allergies  Allergen Reactions  . Shellfish Allergy Anaphylaxis  . Aloe Vera Dermatitis     Procedures/Studies: MR ENTERO ABDOMEN W WO CONTRAST  Result Date: 06/28/2019 CLINICAL DATA:  Diarrhea, weight loss, epigastric pain, recent appendectomy EXAM: MR ABDOMEN AND PELVIS WITHOUT AND WITH CONTRAST (MR ENTEROGRAPHY) TECHNIQUE: Multiplanar, multisequence MRI of the abdomen and pelvis was performed both before and during bolus administration of intravenous contrast. Negative oral contrast VoLumen was given. CONTRAST:  38mL GADAVIST GADOBUTROL 1 MMOL/ML IV SOLN COMPARISON:  CT abdomen/pelvis dated 02/13/2019 FINDINGS: Lower chest: Not visualized Hepatobiliary: Visualized liver is within normal limits. Gallbladder is unremarkable. No intrahepatic or extrahepatic ductal dilatation. No choledocholithiasis is seen. Pancreas:  Within normal limits. Spleen:  Within normal limits. Adrenals/Urinary Tract:  Adrenal glands are within normal limits. Kidneys are within normal limits.  No hydronephrosis. Bladder is within normal limits. Stomach/Bowel: Stomach is within normal limits. No evidence of bowel obstruction. No small bowel wall thickening, mass, or abnormality is seen. Specifically, the mesenteric stranding/inflammatory changes involving a loop  of jejunum in the left mid abdomen has essentially resolved. Prior appendectomy. No colonic wall thickening or mass is evident. Vascular/Lymphatic:  No evidence of abdominal aortic aneurysm. No suspicious abdominopelvic lymphadenopathy. Reproductive: Uterus is within normal limits. Left ovary is within normal limits.  No right adnexal mass. Other:  No abdominopelvic ascites. Musculoskeletal: No focal osseous lesions.  IMPRESSION: No small bowel wall thickening, mass, or abnormality is seen. Specifically, the prior mesenteric stranding/inflammatory changes involving a loop of jejunum in the left mid abdomen has essentially resolved. No colonic wall thickening or mass is evident. No evidence of bowel obstruction.  Prior appendectomy. Electronically Signed   By: Julian Hy M.D.   On: 06/28/2019 11:29   MR ENTERO PELVIS W WO CONTRAST  Result Date: 06/28/2019 CLINICAL DATA:  Diarrhea, weight loss, epigastric pain, recent appendectomy EXAM: MR ABDOMEN AND PELVIS WITHOUT AND WITH CONTRAST (MR ENTEROGRAPHY) TECHNIQUE: Multiplanar, multisequence MRI of the abdomen and pelvis was performed both before and during bolus administration of intravenous contrast. Negative oral contrast VoLumen was given. CONTRAST:  75mL GADAVIST GADOBUTROL 1 MMOL/ML IV SOLN COMPARISON:  CT abdomen/pelvis dated 02/13/2019 FINDINGS: Lower chest: Not visualized Hepatobiliary: Visualized liver is within normal limits. Gallbladder is unremarkable. No intrahepatic or extrahepatic ductal dilatation. No choledocholithiasis is seen. Pancreas:  Within normal limits. Spleen:  Within normal limits. Adrenals/Urinary Tract:  Adrenal glands are within normal limits. Kidneys are within normal limits.  No hydronephrosis. Bladder is within normal limits. Stomach/Bowel: Stomach is within normal limits. No evidence of bowel obstruction. No small bowel wall thickening, mass, or abnormality is seen. Specifically, the mesenteric stranding/inflammatory changes involving a loop of jejunum in the left mid abdomen has essentially resolved. Prior appendectomy. No colonic wall thickening or mass is evident. Vascular/Lymphatic:  No evidence of abdominal aortic aneurysm. No suspicious abdominopelvic lymphadenopathy. Reproductive: Uterus is within normal limits. Left ovary is within normal limits.  No right adnexal mass. Other:  No abdominopelvic ascites. Musculoskeletal: No focal  osseous lesions. IMPRESSION: No small bowel wall thickening, mass, or abnormality is seen. Specifically, the prior mesenteric stranding/inflammatory changes involving a loop of jejunum in the left mid abdomen has essentially resolved. No colonic wall thickening or mass is evident. No evidence of bowel obstruction.  Prior appendectomy. Electronically Signed   By: Julian Hy M.D.   On: 06/28/2019 11:29       Subjective: Reports one episode of diarrhea last night.  Feels much better as diarrhea and abdominal cramping has improved.  Tolerating diet.  Wants to go home.  Discharge Exam: Vitals:   07/11/19 0503 07/11/19 0800  BP: 117/77 117/80  Pulse: 91 89  Resp: 20 18  Temp: 98.2 F (36.8 C) 98.3 F (36.8 C)  SpO2: 100% 99%   Vitals:   07/10/19 1500 07/10/19 2157 07/11/19 0503 07/11/19 0800  BP: 92/60 109/74 117/77 117/80  Pulse: 98 (!) 105 91 89  Resp: 18 20 20 18   Temp: 98.2 F (36.8 C) 98.4 F (36.9 C) 98.2 F (36.8 C) 98.3 F (36.8 C)  TempSrc: Oral Oral Oral Oral  SpO2: 98% 98% 100% 99%  Weight:      Height:        General: Middle-aged female not in distress HEENT: Moist mucosa, supple neck Chest: Clear bilaterally CVs: Normal S1-S2 GI: Soft, nondistended, nontender Musculoskeletal: Warm, no edema    The results of significant diagnostics from this hospitalization (including imaging, microbiology, ancillary and laboratory) are listed below for reference.     Microbiology: Recent Results (  from the past 240 hour(s))  SARS CORONAVIRUS 2 (TAT 6-24 HRS) Nasopharyngeal Nasopharyngeal Swab     Status: None   Collection Time: 07/01/19  3:05 PM   Specimen: Nasopharyngeal Swab  Result Value Ref Range Status   SARS Coronavirus 2 NEGATIVE NEGATIVE Final    Comment: (NOTE) SARS-CoV-2 target nucleic acids are NOT DETECTED. The SARS-CoV-2 RNA is generally detectable in upper and lower respiratory specimens during the acute phase of infection. Negative results do not  preclude SARS-CoV-2 infection, do not rule out co-infections with other pathogens, and should not be used as the sole basis for treatment or other patient management decisions. Negative results must be combined with clinical observations, patient history, and epidemiological information. The expected result is Negative. Fact Sheet for Patients: SugarRoll.be Fact Sheet for Healthcare Providers: https://www.woods-mathews.com/ This test is not yet approved or cleared by the [place] FDA and  has been authorized for detection and/or diagnosis of SARS-CoV-2 by FDA under an Emergency Use Authorization (EUA). This EUA will remain  in effect (meaning this test can be used) for the duration of the COVID-19 declaration under Section 56 4(b)(1) of the Act, 21 U.S.C. section 360bbb-3(b)(1), unless the authorization is terminated or revoked sooner. Performed at [facility], [place] [address] [place], [place] [address]   CULTURE, BLOOD (ROUTINE X 2) w Reflex to ID Panel     Status: None   Collection Time: 07/01/19  6:42 PM   Specimen: BLOOD  Result Value Ref Range Status   Specimen Description BLOOD RIGHT ANTECUBITAL  Final   Special Requests   Final    BOTTLES DRAWN AEROBIC AND ANAEROBIC Blood Culture adequate volume   Culture   Final    NO GROWTH 5 DAYS Performed at [facility], [address] [place], [place] [address]    Report Status 07/06/2019 FINAL  Final  CULTURE, BLOOD (ROUTINE X 2) w Reflex to ID Panel     Status: None   Collection Time: 07/01/19  6:54 PM   Specimen: BLOOD  Result Value Ref Range Status   Specimen Description BLOOD BLOOD RIGHT HAND  Final   Special Requests   Final    BOTTLES DRAWN AEROBIC AND ANAEROBIC Blood Culture adequate volume   Culture   Final    NO GROWTH 5 DAYS Performed at [facility], [address] [place], [place] [address]    Report Status 07/06/2019 FINAL  Final  C  Difficile Quick Screen w PCR reflex     Status: Abnormal   Collection Time: 07/07/19  4:18 PM  Result Value Ref Range Status   C Diff antigen POSITIVE (A) NEGATIVE Final   C Diff toxin NEGATIVE NEGATIVE Final   C Diff interpretation Results are indeterminate. See PCR results.  Final    Comment: Performed at [facility], [address] [place], [place] [address]  Gastrointestinal Panel by PCR , Stool     Status: None   Collection Time: 07/07/19  4:18 PM  Result Value Ref Range Status   Campylobacter species NOT DETECTED NOT DETECTED Final   Plesimonas shigelloides NOT DETECTED NOT DETECTED Final   Salmonella species NOT DETECTED NOT DETECTED Final   Yersinia enterocolitica NOT DETECTED NOT DETECTED Final   Vibrio species NOT DETECTED NOT DETECTED Final   Vibrio cholerae NOT DETECTED NOT DETECTED Final   Enteroaggregative E coli (EAEC) NOT DETECTED NOT DETECTED Final   Enteropathogenic E coli (EPEC) NOT DETECTED NOT DETECTED Final   Enterotoxigenic  E coli (ETEC) NOT DETECTED NOT DETECTED Final   Shiga like toxin producing E coli (STEC) NOT DETECTED NOT DETECTED Final   Shigella/Enteroinvasive E coli (EIEC) NOT DETECTED NOT DETECTED Final   Cryptosporidium NOT DETECTED NOT DETECTED Final   Cyclospora cayetanensis NOT DETECTED NOT DETECTED Final   Entamoeba histolytica NOT DETECTED NOT DETECTED Final   Giardia lamblia NOT DETECTED NOT DETECTED Final   Adenovirus F40/41 NOT DETECTED NOT DETECTED Final   Astrovirus NOT DETECTED NOT DETECTED Final   Norovirus GI/GII NOT DETECTED NOT DETECTED Final   Rotavirus A NOT DETECTED NOT DETECTED Final   Sapovirus (I, II, IV, and V) NOT DETECTED NOT DETECTED Final    Comment: Performed at [facility], [address], [place], [place] [address]  C. Diff by PCR, Reflexed     Status: None   Collection Time: 07/07/19  4:18 PM  Result Value Ref Range Status   Toxigenic C. Difficile by PCR NEGATIVE NEGATIVE Final    Comment:  Patient is colonized with non toxigenic C. difficile. May not need treatment unless significant symptoms are present. Performed at [facility], [address] [place], [place] [address]      Labs: BNP (last 3 results) No results for input(s): BNP in the last 8760 hours. Basic Metabolic Panel: Recent Labs  Lab 07/05/19 0547 07/05/19 1421 07/06/19 0626 07/06/19 0626 07/06/19 1819 07/06/19 1819 07/07/19 0601 07/08/19 0258 07/09/19 0656 07/10/19 0425 07/11/19 0338  NA   < >  --  138   < > 134*   < > 136 138 140 139 140  K   < > 3.5 3.3*   < > 2.8*   < > 3.2* 3.5 4.0 3.6 3.9  CL   < >  --  101   < > 101   < > 102 105 106 105 106  CO2   < >  --  25   < > 22   < > 24 24 25 24 27   GLUCOSE   < >  --  132*   < > 151*   < > 98 111* 104* 97 90  BUN   < >  --  11   < > 10   < > 13 15 19  21* 19  CREATININE   < >  --  1.22*   < > 1.15*   < > 1.37* 1.22* 1.23* 1.10* 1.12*  CALCIUM   < >  --  8.8*   < > 8.7*   < > 8.8* 8.6* 8.6* 8.5* 8.3*  MG  --   --   --   --  1.1*  --  1.4* 1.9  --   --   --   PHOS  --  1.5* 5.9*  --  3.3  --  4.1 3.1  --   --   --    < > = values in this interval not displayed.   Liver Function Tests: Recent Labs  Lab 07/05/19 0547 07/06/19 0626  ALBUMIN 2.9* 3.4*   No results for input(s): LIPASE, AMYLASE in the last 168 hours. No results for input(s): AMMONIA in the last 168 hours. CBC: Recent Labs  Lab 07/07/19 0601 07/08/19 0258 07/09/19 0656  WBC 13.8* 13.3* 11.8*  HGB 11.6* 10.8* 11.2*  HCT 33.4* 31.2* 32.7*  MCV 83.5 83.0 84.5  PLT 522* 505* 503*   Cardiac Enzymes: No results for input(s): CKTOTAL, CKMB, CKMBINDEX, TROPONINI in the last 168 hours. BNP:  Invalid input(s): POCBNP CBG: No results for input(s): GLUCAP in the last 168 hours. D-Dimer No results for input(s): DDIMER in the last 72 hours. Hgb A1c No results for input(s): HGBA1C in the last 72 hours. Lipid Profile No results for input(s): CHOL, HDL, LDLCALC, TRIG,  CHOLHDL, LDLDIRECT in the last 72 hours. Thyroid function studies No results for input(s): TSH, T4TOTAL, T3FREE, THYROIDAB in the last 72 hours.  Invalid input(s): FREET3 Anemia work up No results for input(s): VITAMINB12, FOLATE, FERRITIN, TIBC, IRON, RETICCTPCT in the last 72 hours. Urinalysis    Component Value Date/Time   COLORURINE YELLOW (A) 07/06/2019 1116   APPEARANCEUR HAZY (A) 07/06/2019 1116   LABSPEC 1.009 07/06/2019 1116   PHURINE 6.0 07/06/2019 1116   GLUCOSEU NEGATIVE 07/06/2019 1116   [place] 07/06/2019 1116   [place] 07/06/2019 1116   [place] 07/06/2019 1116   PROTEINUR NEGATIVE 07/06/2019 1116   NITRITE NEGATIVE 07/06/2019 1116   LEUKOCYTESUR NEGATIVE 07/06/2019 1116   Sepsis Labs Invalid input(s): PROCALCITONIN,  WBC,  LACTICIDVEN Microbiology Recent Results (from the past 240 hour(s))  SARS CORONAVIRUS 2 (TAT 6-24 HRS) Nasopharyngeal Nasopharyngeal Swab     Status: None   Collection Time: 07/01/19  3:05 PM   Specimen: Nasopharyngeal Swab  Result Value Ref Range Status   SARS Coronavirus 2 NEGATIVE NEGATIVE Final    Comment: (NOTE) SARS-CoV-2 target nucleic acids are NOT DETECTED. The SARS-CoV-2 RNA is generally detectable in upper and lower respiratory specimens during the acute phase of infection. Negative results do not preclude SARS-CoV-2 infection, do not rule out co-infections with other pathogens, and should not be used as the sole basis for treatment or other patient management decisions. Negative results must be combined with clinical observations, patient history, and epidemiological information. The expected result is Negative. Fact Sheet for Patients: SugarRoll.be Fact Sheet for Healthcare Providers: https://www.woods-mathews.com/ This test is not yet approved or cleared by the [place] FDA and  has been authorized for detection and/or diagnosis of SARS-CoV-2  by FDA under an Emergency Use Authorization (EUA). This EUA will remain  in effect (meaning this test can be used) for the duration of the COVID-19 declaration under Section 56 4(b)(1) of the Act, 21 U.S.C. section 360bbb-3(b)(1), unless the authorization is terminated or revoked sooner. Performed at [facility], [place] [address], [place], [place] [address]   CULTURE, BLOOD (ROUTINE X 2) w Reflex to ID Panel     Status: None   Collection Time: 07/01/19  6:42 PM   Specimen: BLOOD  Result Value Ref Range Status   Specimen Description BLOOD RIGHT ANTECUBITAL  Final   Special Requests   Final    BOTTLES DRAWN AEROBIC AND ANAEROBIC Blood Culture adequate volume   Culture   Final    NO GROWTH 5 DAYS Performed at [facility], [address] [place], [place] [address]    Report Status 07/06/2019 FINAL  Final  CULTURE, BLOOD (ROUTINE X 2) w Reflex to ID Panel     Status: None   Collection Time: 07/01/19  6:54 PM   Specimen: BLOOD  Result Value Ref Range Status   Specimen Description BLOOD BLOOD RIGHT HAND  Final   Special Requests   Final    BOTTLES DRAWN AEROBIC AND ANAEROBIC Blood Culture adequate volume   Culture   Final    NO GROWTH 5 DAYS Performed at [facility], [address], [place], [place] [address]    Report Status 07/06/2019 FINAL  Final  C Difficile Quick Screen w PCR reflex     Status: Abnormal   Collection Time: 07/07/19  4:18 PM  Result Value Ref Range Status   C Diff antigen POSITIVE (A) NEGATIVE Final   C Diff toxin NEGATIVE NEGATIVE Final   C Diff interpretation Results are indeterminate. See PCR results.  Final    Comment: Performed at [facility], [address] [place], [place] [address]  Gastrointestinal Panel by PCR , Stool     Status: None   Collection Time: 07/07/19  4:18 PM  Result Value Ref Range Status   Campylobacter species NOT DETECTED NOT DETECTED Final   Plesimonas shigelloides NOT DETECTED NOT DETECTED  Final   Salmonella species NOT DETECTED NOT DETECTED Final   Yersinia enterocolitica NOT DETECTED NOT DETECTED Final   Vibrio species NOT DETECTED NOT DETECTED Final   Vibrio cholerae NOT DETECTED NOT DETECTED Final   Enteroaggregative E coli (EAEC) NOT DETECTED NOT DETECTED Final   Enteropathogenic E coli (EPEC) NOT DETECTED NOT DETECTED Final   Enterotoxigenic E coli (ETEC) NOT DETECTED NOT DETECTED Final   Shiga like toxin producing E coli (STEC) NOT DETECTED NOT DETECTED Final   Shigella/Enteroinvasive E coli (EIEC) NOT DETECTED NOT DETECTED Final   Cryptosporidium NOT DETECTED NOT DETECTED Final   Cyclospora cayetanensis NOT DETECTED NOT DETECTED Final   Entamoeba histolytica NOT DETECTED NOT DETECTED Final   Giardia lamblia NOT DETECTED NOT DETECTED Final   Adenovirus F40/41 NOT DETECTED NOT DETECTED Final   Astrovirus NOT DETECTED NOT DETECTED Final   Norovirus GI/GII NOT DETECTED NOT DETECTED Final   Rotavirus A NOT DETECTED NOT DETECTED Final   Sapovirus (I, II, IV, and V) NOT DETECTED NOT DETECTED Final    Comment: Performed at [facility], [address] [place], [place] [address]  C. Diff by PCR, Reflexed     Status: None   Collection Time: 07/07/19  4:18 PM  Result Value Ref Range Status   Toxigenic C. Difficile by PCR NEGATIVE NEGATIVE Final    Comment: Patient is colonized with non toxigenic C. difficile. May not need treatment unless significant symptoms are present. Performed at [facility], [address] [place],  [address]      Time coordinating discharge: 35 minutes  SIGNED:   Louellen Molder, MD  [facility] 07/11/2019, 10:03 AM Pager   If 7PM-7AM, please contact night-coverage [email or web address] Password [email or web address]

## 2019-07-11 NOTE — TOC Transition Note (Signed)
Transition of Care [facility]) - CM/SW Discharge Note   Patient Details  Name: Molly Cisneros MRN: [id number] Date of Birth: 01-Oct-1985  Transition of Care [facility] CM/SW Contact:  Candie Chroman, LCSW Phone Number: 07/11/2019, 10:55 AM   Clinical Narrative: Patient has orders to discharge home today. No further concerns. CSW signing off.    Final next level of care: Home/Self Care Barriers to Discharge: Barriers Resolved   Patient Goals and CMS Choice        Discharge Placement                    Patient and family notified of of transfer: 07/11/19  Discharge Plan and Services                                     Social Determinants of Health (SDOH) Interventions     Readmission Risk Interventions Readmission Risk Prevention Plan 07/05/2019 06/16/2019 06/14/2019  Transportation Screening Complete Complete -  PCP or Specialist Appt within 3-5 Days - Complete -  HRI or [place] - (No Data) -  Palliative Care Screening - Not Applicable Not Applicable  Medication Review ([place]) Complete Complete Complete  PCP or Specialist appointment within 3-5 days of discharge Complete - -  [place] or Home Care Consult Complete - -  Palliative Care Screening Not Applicable - -  [place] Not Applicable - -  Some recent data might be hidden

## 2019-07-12 ENCOUNTER — Encounter: Payer: Self-pay | Admitting: Internal Medicine

## 2019-07-12 ENCOUNTER — Other Ambulatory Visit: Payer: Self-pay | Admitting: Internal Medicine

## 2019-07-12 ENCOUNTER — Encounter: Payer: Self-pay | Admitting: Gastroenterology

## 2019-07-12 ENCOUNTER — Other Ambulatory Visit: Payer: Self-pay | Admitting: Gastroenterology

## 2019-07-12 DIAGNOSIS — E876 Hypokalemia: Secondary | ICD-10-CM

## 2019-07-12 DIAGNOSIS — K580 Irritable bowel syndrome with diarrhea: Secondary | ICD-10-CM

## 2019-07-12 DIAGNOSIS — R197 Diarrhea, unspecified: Secondary | ICD-10-CM

## 2019-07-12 MED ORDER — AMITRIPTYLINE HCL 50 MG PO TABS: 75.0000 mg | ORAL_TABLET | Freq: Every day | ORAL | 0 refills | Status: DC

## 2019-07-13 ENCOUNTER — Other Ambulatory Visit: Payer: Self-pay

## 2019-07-13 ENCOUNTER — Other Ambulatory Visit
Admission: RE | Admit: 2019-07-13 | Discharge: 2019-07-13 | Disposition: A | Discharge status: Home / Self Care | Payer: PRIVATE HEALTH INSURANCE | Attending: Internal Medicine | Admitting: Internal Medicine

## 2019-07-13 DIAGNOSIS — R197 Diarrhea, unspecified: Secondary | ICD-10-CM | POA: Diagnosis not present

## 2019-07-13 DIAGNOSIS — E876 Hypokalemia: Secondary | ICD-10-CM | POA: Diagnosis not present

## 2019-07-13 LAB — COMPREHENSIVE METABOLIC PANEL
ALT: 22 U/L (ref 0–44)
AST: 18 U/L (ref 15–41)
Albumin: 3.6 g/dL (ref 3.5–5.0)
Alkaline Phosphatase: 47 U/L (ref 38–126)
Anion gap: 12 (ref 5–15)
BUN: 19 mg/dL (ref 6–20)
CO2: 22 mmol/L (ref 22–32)
Calcium: 8.7 mg/dL — ABNORMAL LOW (ref 8.9–10.3)
Chloride: 104 mmol/L (ref 98–111)
Creatinine, Ser: 1.05 mg/dL — ABNORMAL HIGH (ref 0.44–1.00)
GFR calc Af Amer: >60 mL/min (ref >60)
GFR calc non Af Amer: >60 mL/min (ref >60)
Glucose, Bld: 86 mg/dL (ref 70–99)
Potassium: 4.4 mmol/L (ref 3.5–5.1)
Sodium: 138 mmol/L (ref 135–145)
Total Bilirubin: 0.4 mg/dL (ref 0.3–1.2)
Total Protein: 6.7 g/dL (ref 6.5–8.1)

## 2019-07-13 LAB — PHOSPHORUS: Phosphorus: 3.7 mg/dL (ref 2.5–4.6)

## 2019-07-14 ENCOUNTER — Ambulatory Visit: Payer: PRIVATE HEALTH INSURANCE | Attending: Internal Medicine

## 2019-07-14 DIAGNOSIS — Z23 Encounter for immunization: Secondary | ICD-10-CM | POA: Insufficient documentation

## 2019-07-14 NOTE — Progress Notes (Signed)
   Covid-19 Vaccination Clinic  Name:  Molly Cisneros    MRN: [id number] DOB: 1986/01/02  07/14/2019  [name] was observed post Covid-19 immunization for 30 minutes based on pre-vaccination screening without incidence. She was provided with Vaccine Information Sheet and instruction to access the V-Safe system.   [name] was instructed to call 911 with any severe reactions post vaccine: Marland Kitchen Difficulty breathing  . Swelling of your face and throat  . A fast heartbeat  . A bad rash all over your body  . Dizziness and weakness    Immunizations Administered    Name Date Dose VIS Date Route   Pfizer COVID-19 Vaccine 07/14/2019 12:06 PM 0.3 mL 04/29/2019 Intramuscular   Manufacturer: [place]   Lot: [id number]   [place]: [id number]

## 2019-07-15 ENCOUNTER — Encounter: Payer: Self-pay | Admitting: Hematology and Oncology

## 2019-07-16 DIAGNOSIS — Z8619 Personal history of other infectious and parasitic diseases: Secondary | ICD-10-CM

## 2019-07-16 HISTORY — DX: Personal history of other infectious and parasitic diseases: Z86.19

## 2019-07-18 MED ORDER — GENERIC EXTERNAL MEDICATION
Status: DC
Start: ? — End: 2019-07-18

## 2019-07-18 MED ORDER — CYANOCOBALAMIN 500 MCG PO TABS
1000.00 | ORAL_TABLET | ORAL | Status: DC
Start: 2019-07-21 — End: 2019-07-18

## 2019-07-18 MED ORDER — GENERIC EXTERNAL MEDICATION
6.25 | Status: DC
Start: ? — End: 2019-07-18

## 2019-07-18 MED ORDER — PANTOPRAZOLE SODIUM 40 MG PO TBEC
40.00 | DELAYED_RELEASE_TABLET | ORAL | Status: DC
Start: 2019-07-20 — End: 2019-07-18

## 2019-07-18 MED ORDER — LIDOCAINE HCL 1 % IJ SOLN
0.50 | INTRAMUSCULAR | Status: DC
Start: ? — End: 2019-07-18

## 2019-07-18 MED ORDER — DULOXETINE HCL 60 MG PO CPEP
60.00 | ORAL_CAPSULE | ORAL | Status: DC
Start: 2019-07-21 — End: 2019-07-18

## 2019-07-18 MED ORDER — ATORVASTATIN CALCIUM 20 MG PO TABS
20.00 | ORAL_TABLET | ORAL | Status: DC
Start: 2019-07-21 — End: 2019-07-18

## 2019-07-18 MED ORDER — DIPHENOXYLATE-ATROPINE 2.5-0.025 MG PO TABS
2.00 | ORAL_TABLET | ORAL | Status: DC
Start: 2019-07-20 — End: 2019-07-18

## 2019-07-18 MED ORDER — DIPHENOXYLATE-ATROPINE 2.5-0.025 MG PO TABS
1.00 | ORAL_TABLET | ORAL | Status: DC
Start: ? — End: 2019-07-18

## 2019-07-18 MED ORDER — ACETAMINOPHEN 325 MG PO TABS
650.00 | ORAL_TABLET | ORAL | Status: DC
Start: ? — End: 2019-07-18

## 2019-07-18 MED ORDER — FOLIC ACID 1 MG PO TABS
1.00 | ORAL_TABLET | ORAL | Status: DC
Start: 2019-07-21 — End: 2019-07-18

## 2019-07-18 MED ORDER — DEXTROSE 50 % IV SOLN
12.50 | INTRAVENOUS | Status: DC
Start: ? — End: 2019-07-18

## 2019-07-18 MED ORDER — HYDROXYZINE PAMOATE 25 MG PO CAPS
25.00 | ORAL_CAPSULE | ORAL | Status: DC
Start: ? — End: 2019-07-18

## 2019-07-18 MED ORDER — OCTREOTIDE ACETATE 100 MCG/ML IJ SOLN
100.00 | INTRAMUSCULAR | Status: DC
Start: 2019-07-18 — End: 2019-07-18

## 2019-07-18 MED ORDER — TRAMADOL HCL 50 MG PO TABS
50.00 | ORAL_TABLET | ORAL | Status: DC
Start: ? — End: 2019-07-18

## 2019-07-18 MED ORDER — ENOXAPARIN SODIUM 40 MG/0.4ML ~~LOC~~ SOLN
40.00 | SUBCUTANEOUS | Status: DC
Start: 2019-07-21 — End: 2019-07-18

## 2019-07-18 MED ORDER — DICYCLOMINE HCL 10 MG PO CAPS
20.00 | ORAL_CAPSULE | ORAL | Status: DC
Start: 2019-07-20 — End: 2019-07-18

## 2019-07-19 NOTE — Telephone Encounter (Signed)
Last refill 07/12/2019 0 refills

## 2019-07-20 MED ORDER — GENERIC EXTERNAL MEDICATION
Status: DC
Start: ? — End: 2019-07-20

## 2019-07-25 ENCOUNTER — Other Ambulatory Visit: Payer: Self-pay | Admitting: Internal Medicine

## 2019-07-25 DIAGNOSIS — E876 Hypokalemia: Secondary | ICD-10-CM

## 2019-07-26 ENCOUNTER — Inpatient Hospital Stay: Payer: PRIVATE HEALTH INSURANCE | Admitting: Internal Medicine

## 2019-07-26 ENCOUNTER — Other Ambulatory Visit: Payer: Self-pay | Admitting: Internal Medicine

## 2019-07-26 DIAGNOSIS — E876 Hypokalemia: Secondary | ICD-10-CM

## 2019-07-27 LAB — MAGNESIUM: Magnesium: 1.7 mg/dL (ref 1.6–2.3)

## 2019-07-27 LAB — BASIC METABOLIC PANEL
BUN/Creatinine Ratio: 7 — ABNORMAL LOW (ref 9–23)
BUN: 7 mg/dL (ref 6–20)
CO2: 23 mmol/L (ref 20–29)
Calcium: 9.7 mg/dL (ref 8.7–10.2)
Chloride: 101 mmol/L (ref 96–106)
Creatinine, Ser: 1.03 mg/dL — ABNORMAL HIGH (ref 0.57–1.00)
GFR calc Af Amer: 81 mL/min/{1.73_m2} (ref >59)
GFR calc non Af Amer: 71 mL/min/{1.73_m2} (ref >59)
Glucose: 96 mg/dL (ref 65–99)
Potassium: 3.8 mmol/L (ref 3.5–5.2)
Sodium: 139 mmol/L (ref 134–144)

## 2019-07-27 LAB — PHOSPHORUS: Phosphorus: 4.1 mg/dL (ref 3.0–4.3)

## 2019-08-01 ENCOUNTER — Other Ambulatory Visit: Payer: Self-pay | Admitting: Internal Medicine

## 2019-08-01 DIAGNOSIS — R197 Diarrhea, unspecified: Secondary | ICD-10-CM

## 2019-08-03 ENCOUNTER — Other Ambulatory Visit: Payer: Self-pay | Admitting: Internal Medicine

## 2019-08-03 DIAGNOSIS — I10 Essential (primary) hypertension: Secondary | ICD-10-CM

## 2019-08-03 DIAGNOSIS — R197 Diarrhea, unspecified: Secondary | ICD-10-CM

## 2019-08-04 LAB — BASIC METABOLIC PANEL
BUN/Creatinine Ratio: 10 (ref 9–23)
BUN: 12 mg/dL (ref 6–20)
CO2: 24 mmol/L (ref 20–29)
Calcium: 10 mg/dL (ref 8.7–10.2)
Chloride: 98 mmol/L (ref 96–106)
Creatinine, Ser: 1.15 mg/dL — ABNORMAL HIGH (ref 0.57–1.00)
GFR calc Af Amer: 71 mL/min/{1.73_m2} (ref >59)
GFR calc non Af Amer: 62 mL/min/{1.73_m2} (ref >59)
Glucose: 95 mg/dL (ref 65–99)
Potassium: 3.9 mmol/L (ref 3.5–5.2)
Sodium: 141 mmol/L (ref 134–144)

## 2019-08-04 LAB — MAGNESIUM: Magnesium: 2 mg/dL (ref 1.6–2.3)

## 2019-08-10 ENCOUNTER — Ambulatory Visit: Payer: PRIVATE HEALTH INSURANCE

## 2019-08-12 ENCOUNTER — Other Ambulatory Visit: Payer: Self-pay

## 2019-08-12 DIAGNOSIS — E876 Hypokalemia: Secondary | ICD-10-CM

## 2019-08-12 DIAGNOSIS — R197 Diarrhea, unspecified: Secondary | ICD-10-CM

## 2019-08-12 MED ORDER — AMITRIPTYLINE HCL 25 MG PO TABS
50.00 | ORAL_TABLET | ORAL | Status: DC
Start: ? — End: 2019-08-12

## 2019-08-12 MED ORDER — GENERIC EXTERNAL MEDICATION
5000.00 | Status: DC
Start: 2019-08-13 — End: 2019-08-12

## 2019-08-12 MED ORDER — TRAMADOL HCL 50 MG PO TABS
50.00 | ORAL_TABLET | ORAL | Status: DC
Start: ? — End: 2019-08-12

## 2019-08-12 MED ORDER — CYANOCOBALAMIN 500 MCG PO TABS
1000.00 | ORAL_TABLET | ORAL | Status: DC
Start: 2019-08-13 — End: 2019-08-12

## 2019-08-12 MED ORDER — OCTREOTIDE ACETATE 100 MCG/ML IJ SOLN
100.00 | INTRAMUSCULAR | Status: DC
Start: 2019-08-12 — End: 2019-08-12

## 2019-08-12 MED ORDER — PANTOPRAZOLE SODIUM 40 MG PO TBEC
40.00 | DELAYED_RELEASE_TABLET | ORAL | Status: DC
Start: 2019-08-12 — End: 2019-08-12

## 2019-08-12 MED ORDER — DULOXETINE HCL 60 MG PO CPEP
60.00 | ORAL_CAPSULE | ORAL | Status: DC
Start: 2019-08-13 — End: 2019-08-12

## 2019-08-12 MED ORDER — GABAPENTIN 300 MG PO CAPS
300.00 | ORAL_CAPSULE | ORAL | Status: DC
Start: ? — End: 2019-08-12

## 2019-08-12 MED ORDER — DIPHENOXYLATE-ATROPINE 2.5-0.025 MG PO TABS
2.00 | ORAL_TABLET | ORAL | Status: DC
Start: 2019-08-12 — End: 2019-08-12

## 2019-08-12 MED ORDER — DICYCLOMINE HCL 10 MG PO CAPS
20.00 | ORAL_CAPSULE | ORAL | Status: DC
Start: 2019-08-12 — End: 2019-08-12

## 2019-08-12 MED ORDER — FOLIC ACID 1 MG PO TABS
1.00 | ORAL_TABLET | ORAL | Status: DC
Start: 2019-08-13 — End: 2019-08-12

## 2019-08-12 MED ORDER — ATORVASTATIN CALCIUM 20 MG PO TABS
20.00 | ORAL_TABLET | ORAL | Status: DC
Start: 2019-08-13 — End: 2019-08-12

## 2019-08-12 MED ORDER — HYDROXYZINE PAMOATE 25 MG PO CAPS
25.00 | ORAL_CAPSULE | ORAL | Status: DC
Start: ? — End: 2019-08-12

## 2019-08-12 MED ORDER — LIDOCAINE HCL 1 % IJ SOLN
0.50 | INTRAMUSCULAR | Status: DC
Start: ? — End: 2019-08-12

## 2019-08-12 MED ORDER — PROMETHAZINE HCL 25 MG PO TABS
25.00 | ORAL_TABLET | ORAL | Status: DC
Start: ? — End: 2019-08-12

## 2019-08-12 MED ORDER — ONDANSETRON HCL 4 MG/2ML IJ SOLN
4.00 | INTRAMUSCULAR | Status: DC
Start: ? — End: 2019-08-12

## 2019-08-12 MED ORDER — ALPRAZOLAM 1 MG PO TABS
1.00 | ORAL_TABLET | ORAL | Status: DC
Start: ? — End: 2019-08-12

## 2019-08-13 ENCOUNTER — Ambulatory Visit: Payer: PRIVATE HEALTH INSURANCE

## 2019-08-16 ENCOUNTER — Encounter: Payer: Self-pay | Admitting: Internal Medicine

## 2019-08-16 ENCOUNTER — Ambulatory Visit: Payer: PRIVATE HEALTH INSURANCE | Attending: Internal Medicine

## 2019-08-16 ENCOUNTER — Other Ambulatory Visit: Payer: Self-pay | Admitting: Internal Medicine

## 2019-08-16 DIAGNOSIS — N3000 Acute cystitis without hematuria: Secondary | ICD-10-CM

## 2019-08-16 DIAGNOSIS — Z23 Encounter for immunization: Secondary | ICD-10-CM

## 2019-08-16 LAB — BASIC METABOLIC PANEL
BUN/Creatinine Ratio: 9 (ref 9–23)
BUN: 10 mg/dL (ref 6–20)
CO2: 19 mmol/L — ABNORMAL LOW (ref 20–29)
Calcium: 9.2 mg/dL (ref 8.7–10.2)
Chloride: 108 mmol/L — ABNORMAL HIGH (ref 96–106)
Creatinine, Ser: 1.08 mg/dL — ABNORMAL HIGH (ref 0.57–1.00)
GFR calc Af Amer: 77 mL/min/{1.73_m2} (ref >59)
GFR calc non Af Amer: 67 mL/min/{1.73_m2} (ref >59)
Glucose: 117 mg/dL — ABNORMAL HIGH (ref 65–99)
Potassium: 4.1 mmol/L (ref 3.5–5.2)
Sodium: 141 mmol/L (ref 134–144)

## 2019-08-16 LAB — CBC WITH DIFFERENTIAL/PLATELET
Basophils Absolute: 0.1 10*3/uL (ref 0.0–0.2)
Basos: 0 %
EOS (ABSOLUTE): 0.5 10*3/uL — ABNORMAL HIGH (ref 0.0–0.4)
Eos: 3 %
Hematocrit: 29.7 % — ABNORMAL LOW (ref 34.0–46.6)
Hemoglobin: 10 g/dL — ABNORMAL LOW (ref 11.1–15.9)
Immature Grans (Abs): 0 10*3/uL (ref 0.0–0.1)
Immature Granulocytes: 0 %
Lymphocytes Absolute: 2.4 10*3/uL (ref 0.7–3.1)
Lymphs: 13 %
MCH: 29.8 pg (ref 26.6–33.0)
MCHC: 33.7 g/dL (ref 31.5–35.7)
MCV: 88 fL (ref 79–97)
Monocytes Absolute: 1 10*3/uL — ABNORMAL HIGH (ref 0.1–0.9)
Monocytes: 6 %
Neutrophils Absolute: 14.6 10*3/uL — ABNORMAL HIGH (ref 1.4–7.0)
Neutrophils: 78 %
Platelets: 444 10*3/uL (ref 150–450)
RBC: 3.36 x10E6/uL — ABNORMAL LOW (ref 3.77–5.28)
RDW: 13.3 % (ref 11.7–15.4)
WBC: 18.6 10*3/uL — ABNORMAL HIGH (ref 3.4–10.8)

## 2019-08-16 LAB — MAGNESIUM: Magnesium: 1.6 mg/dL (ref 1.6–2.3)

## 2019-08-16 MED ORDER — NITROFURANTOIN MONOHYD MACRO 100 MG PO CAPS: 100.0000 mg | ORAL_CAPSULE | Freq: Two times a day (BID) | ORAL | 0 refills | Status: AC

## 2019-08-16 NOTE — Progress Notes (Signed)
   Covid-19 Vaccination Clinic  Name:  [name]    MRN: [id number] DOB: [birth year]  08/16/2019  [name] was observed post Covid-19 immunization for 15 minutes without incident. She was provided with Vaccine Information Sheet and instruction to access the V-Safe system.   Ms. Honey was instructed to call 911 with any severe reactions post vaccine: Marland Kitchen Difficulty breathing  . Swelling of face and throat  . A fast heartbeat  . A bad rash all over body  . Dizziness and weakness   Immunizations Administered    Name Date Dose VIS Date Route   Pfizer COVID-19 Vaccine 08/16/2019 11:20 AM 0.3 mL 04/29/2019 Intramuscular   Manufacturer: [place]   Lot: [phone] [id number]   [place]: [id number]

## 2019-08-22 ENCOUNTER — Other Ambulatory Visit: Payer: Self-pay

## 2019-08-22 DIAGNOSIS — R197 Diarrhea, unspecified: Secondary | ICD-10-CM

## 2019-08-23 LAB — BASIC METABOLIC PANEL
BUN/Creatinine Ratio: 7 — ABNORMAL LOW (ref 9–23)
BUN: 8 mg/dL (ref 6–20)
CO2: 18 mmol/L — ABNORMAL LOW (ref 20–29)
Calcium: 9.1 mg/dL (ref 8.7–10.2)
Chloride: 109 mmol/L — ABNORMAL HIGH (ref 96–106)
Creatinine, Ser: 1.08 mg/dL — ABNORMAL HIGH (ref 0.57–1.00)
GFR calc Af Amer: 77 mL/min/{1.73_m2} (ref >59)
GFR calc non Af Amer: 67 mL/min/{1.73_m2} (ref >59)
Glucose: 92 mg/dL (ref 65–99)
Potassium: 3.9 mmol/L (ref 3.5–5.2)
Sodium: 141 mmol/L (ref 134–144)

## 2019-09-01 ENCOUNTER — Other Ambulatory Visit: Payer: Self-pay | Admitting: Internal Medicine

## 2019-09-01 DIAGNOSIS — E876 Hypokalemia: Secondary | ICD-10-CM

## 2019-09-01 DIAGNOSIS — R197 Diarrhea, unspecified: Secondary | ICD-10-CM

## 2019-09-02 LAB — BASIC METABOLIC PANEL
BUN/Creatinine Ratio: 7 — ABNORMAL LOW (ref 9–23)
BUN: 7 mg/dL (ref 6–20)
CO2: 17 mmol/L — ABNORMAL LOW (ref 20–29)
Calcium: 9.8 mg/dL (ref 8.7–10.2)
Chloride: 103 mmol/L (ref 96–106)
Creatinine, Ser: 0.94 mg/dL (ref 0.57–1.00)
GFR calc Af Amer: 91 mL/min/{1.73_m2} (ref >59)
GFR calc non Af Amer: 79 mL/min/{1.73_m2} (ref >59)
Glucose: 118 mg/dL — ABNORMAL HIGH (ref 65–99)
Potassium: 3.3 mmol/L — ABNORMAL LOW (ref 3.5–5.2)
Sodium: 137 mmol/L (ref 134–144)

## 2019-09-02 LAB — 5-HIAA QN,RANDOM URINE
5-HIAA, Urine: 4.7 mg/L
5-HIAA,Qn,Random,Ur: 3 mg/g Creat (ref 0.0–6.9)
Creatinine, Random U: 155 mg/dL

## 2019-09-02 LAB — PHOSPHORUS: Phosphorus: 4 mg/dL (ref 3.0–4.3)

## 2019-09-02 LAB — MAGNESIUM, FECAL: Magnesium, Fecal-Per Volume: 8 mg/dL (ref 0–110)

## 2019-09-02 LAB — FECAL LACTOFERRIN, QUANT: Lactoferrin, Fecal, Quant.: 1.64 ug/mL(g) (ref 0.00–7.24)

## 2019-09-02 LAB — CHROMOGRANIN A: Chromogranin A (ng/mL): 158 ng/mL — ABNORMAL HIGH (ref 0.0–101.8)

## 2019-09-02 LAB — SODIUM, STOOL: Sodium, Stl: 106 mmol/L

## 2019-09-02 LAB — OSMOLALITY, STOOL: Osmolality,Stl: 329 mOsmol/kg

## 2019-09-02 LAB — POTASSIUM, STOOL: Potassium, Stl: 42 mmol/L

## 2019-09-02 LAB — VASOACTIVE INTESTINAL PEPTIDE (VIP): Vasoactive Intest Polypeptide: 49.3 pg/mL (ref 0.0–58.8)

## 2019-09-02 LAB — OSMOLALITY: Osmolality Meas: 284 mOsmol/kg (ref 275–295)

## 2019-09-02 LAB — GASTRIN: Gastrin: 64 pg/mL (ref 0–115)

## 2019-09-05 ENCOUNTER — Other Ambulatory Visit: Payer: Self-pay | Admitting: Gastroenterology

## 2019-09-08 ENCOUNTER — Ambulatory Visit: Payer: Self-pay | Admitting: Internal Medicine

## 2019-09-09 ENCOUNTER — Other Ambulatory Visit: Payer: Self-pay | Admitting: Family Medicine

## 2019-09-09 NOTE — Telephone Encounter (Signed)
Labs reported as stable- RF granted

## 2019-09-15 ENCOUNTER — Other Ambulatory Visit: Payer: Self-pay | Admitting: Internal Medicine

## 2019-09-15 NOTE — Telephone Encounter (Signed)
Requested medication (s) are due for refill today:   Yes  Requested medication (s) are on the active medication list:   Yes  Future visit scheduled:   No    Last ordered: 03/03/2019  #90  1 refill  Clinic note:   Returning because labs due.   Last ones were 2019.   Requested Prescriptions  Pending Prescriptions Disp Refills   atorvastatin (LIPITOR) 20 MG tablet [Pharmacy Med Name: ATORVASTATIN CALCIUM 20 MG TAB] 90 tablet 1    Sig: TAKE (1) TABLET BY MOUTH EVERY DAY      Cardiovascular:  Antilipid - Statins Failed - 09/15/2019 12:37 PM      Failed - Total Cholesterol in normal range and within 360 days    Cholesterol  Date Value Ref Range Status  05/26/2017 266 (H) 0 - 200 mg/dL Final          Failed - LDL in normal range and within 360 days    LDL Cholesterol  Date Value Ref Range Status  05/26/2017 147 (H) 0 - 99 mg/dL Final    Comment:           Total Cholesterol/HDL:CHD Risk Coronary Heart Disease Risk Table                     Men   Women  1/2 Average Risk   3.4   3.3  Average Risk       5.0   4.4  2 X Average Risk   9.6   7.1  3 X Average Risk  23.4   11.0        Use the calculated Patient Ratio above and the CHD Risk Table to determine the patient's CHD Risk.        ATP III CLASSIFICATION (LDL):  <100     mg/dL   Optimal  100-129  mg/dL   Near or Above                    Optimal  130-159  mg/dL   Borderline  160-189  mg/dL   High  >190     mg/dL   Very High Performed at [facility], [address] [place], [place] [address]           Failed - HDL in normal range and within 360 days    HDL  Date Value Ref Range Status  05/26/2017 40 (L) >40 mg/dL Final          Failed - Triglycerides in normal range and within 360 days    Triglycerides  Date Value Ref Range Status  05/26/2017 394 (H) <150 mg/dL Final          Passed - Patient is not pregnant      Passed - Valid encounter within last 12 months    Recent Outpatient Visits        3 months ago Syncope, unspecified syncope type   [facility] Glean Hess, MD   3 months ago Chronic diarrhea   [facility] Glean Hess, MD   1 year ago Post-concussion headache   [facility] Glean Hess, MD   1 year ago Acute non-recurrent maxillary sinusitis   [facility] Glean Hess, MD   2 years ago Acute right-sided low back pain with right-sided sciatica   [facility] Glean Hess, MD

## 2019-09-16 ENCOUNTER — Other Ambulatory Visit: Payer: Self-pay | Admitting: Internal Medicine

## 2019-09-16 MED ORDER — PROMETHAZINE HCL 25 MG PO TABS: 25.0000 mg | ORAL_TABLET | Freq: Three times a day (TID) | ORAL | 0 refills | Status: DC | PRN

## 2019-09-19 ENCOUNTER — Other Ambulatory Visit: Payer: Self-pay | Admitting: Internal Medicine

## 2019-09-19 ENCOUNTER — Other Ambulatory Visit: Payer: Self-pay

## 2019-09-19 DIAGNOSIS — M50322 Other cervical disc degeneration at C5-C6 level: Secondary | ICD-10-CM

## 2019-09-19 MED ORDER — PROMETHAZINE HCL 25 MG PO TABS: 25.0000 mg | ORAL_TABLET | Freq: Three times a day (TID) | ORAL | 1 refills | Status: DC | PRN

## 2019-09-19 MED ORDER — CYCLOBENZAPRINE HCL 5 MG PO TABS: 5.0000 mg | ORAL_TABLET | Freq: Three times a day (TID) | ORAL | 0 refills | Status: DC

## 2019-09-21 ENCOUNTER — Other Ambulatory Visit: Payer: Self-pay

## 2019-09-21 DIAGNOSIS — R197 Diarrhea, unspecified: Secondary | ICD-10-CM

## 2019-09-21 DIAGNOSIS — E876 Hypokalemia: Secondary | ICD-10-CM

## 2019-09-22 LAB — BASIC METABOLIC PANEL
BUN/Creatinine Ratio: 8 — ABNORMAL LOW (ref 9–23)
BUN: 9 mg/dL (ref 6–20)
CO2: 17 mmol/L — ABNORMAL LOW (ref 20–29)
Calcium: 10.4 mg/dL — ABNORMAL HIGH (ref 8.7–10.2)
Chloride: 94 mmol/L — ABNORMAL LOW (ref 96–106)
Creatinine, Ser: 1.14 mg/dL — ABNORMAL HIGH (ref 0.57–1.00)
GFR calc Af Amer: 72 mL/min/{1.73_m2} (ref >59)
GFR calc non Af Amer: 62 mL/min/{1.73_m2} (ref >59)
Glucose: 84 mg/dL (ref 65–99)
Potassium: 3.6 mmol/L (ref 3.5–5.2)
Sodium: 134 mmol/L (ref 134–144)

## 2019-10-04 ENCOUNTER — Other Ambulatory Visit: Payer: Self-pay | Admitting: Family Medicine

## 2019-10-04 ENCOUNTER — Other Ambulatory Visit: Payer: Self-pay | Admitting: Internal Medicine

## 2019-10-04 NOTE — Telephone Encounter (Signed)
Requested medication (s) are due for refill today - not current on medication list  Requested medication (s) are on the active medication list -no  Future visit scheduled - no  Last refill: 11/02/17  Notes to clinic: Request refill of discontinued Rx  Requested Prescriptions  Pending Prescriptions Disp Refills   NUVARING 0.12-0.015 MG/24HR vaginal ring [Pharmacy Med Name: NUVARING 0.12-0.015 MG/24HR VAGINAL] 3 each 3    Sig: USE AS DIRECTED      OB/GYN:  Contraceptives Passed - 10/04/2019 11:55 AM      Passed - Last BP in normal range    BP Readings from Last 1 Encounters:  07/11/19 117/80          Passed - Valid encounter within last 12 months    Recent Outpatient Visits           3 months ago Syncope, unspecified syncope type   [facility] Glean Hess, MD   4 months ago Chronic diarrhea   [facility] Glean Hess, MD   1 year ago Post-concussion headache   [facility] Glean Hess, MD   1 year ago Acute non-recurrent maxillary sinusitis   [facility] Glean Hess, MD   2 years ago Acute right-sided low back pain with right-sided sciatica   [facility] Glean Hess, MD                  Requested Prescriptions  Pending Prescriptions Disp Refills   NUVARING 0.12-0.015 MG/24HR vaginal ring [Pharmacy Med Name: NUVARING 0.12-0.015 MG/24HR VAGINAL] 3 each 3    Sig: USE AS DIRECTED      OB/GYN:  Contraceptives Passed - 10/04/2019 11:55 AM      Passed - Last BP in normal range    BP Readings from Last 1 Encounters:  07/11/19 117/80          Passed - Valid encounter within last 12 months    Recent Outpatient Visits           3 months ago Syncope, unspecified syncope type   [facility] Glean Hess, MD   4 months ago Chronic diarrhea   [facility] Glean Hess, MD   1 year ago Post-concussion headache   [facility] Glean Hess, MD   1 year  ago Acute non-recurrent maxillary sinusitis   [facility] Glean Hess, MD   2 years ago Acute right-sided low back pain with right-sided sciatica   [facility] Glean Hess, MD

## 2019-10-14 ENCOUNTER — Other Ambulatory Visit: Payer: Self-pay | Admitting: Internal Medicine

## 2019-10-14 ENCOUNTER — Other Ambulatory Visit
Admission: RE | Admit: 2019-10-14 | Discharge: 2019-10-14 | Disposition: A | Discharge status: Home / Self Care | Payer: PRIVATE HEALTH INSURANCE | Source: Home / Self Care | Attending: Internal Medicine | Admitting: Internal Medicine

## 2019-10-14 DIAGNOSIS — R197 Diarrhea, unspecified: Secondary | ICD-10-CM

## 2019-10-14 DIAGNOSIS — N179 Acute kidney failure, unspecified: Secondary | ICD-10-CM | POA: Diagnosis not present

## 2019-10-14 DIAGNOSIS — N170 Acute kidney failure with tubular necrosis: Secondary | ICD-10-CM | POA: Diagnosis not present

## 2019-10-14 DIAGNOSIS — E876 Hypokalemia: Secondary | ICD-10-CM

## 2019-10-14 LAB — BASIC METABOLIC PANEL
Anion gap: 11 (ref 5–15)
BUN: 10 mg/dL (ref 6–20)
CO2: 22 mmol/L (ref 22–32)
Calcium: 9.3 mg/dL (ref 8.9–10.3)
Chloride: 100 mmol/L (ref 98–111)
Creatinine, Ser: 1.09 mg/dL — ABNORMAL HIGH (ref 0.44–1.00)
GFR calc Af Amer: >60 mL/min (ref >60)
GFR calc non Af Amer: >60 mL/min (ref >60)
Glucose, Bld: 97 mg/dL (ref 70–99)
Potassium: 3.4 mmol/L — ABNORMAL LOW (ref 3.5–5.1)
Sodium: 133 mmol/L — ABNORMAL LOW (ref 135–145)

## 2019-10-15 ENCOUNTER — Other Ambulatory Visit: Payer: Self-pay

## 2019-10-15 ENCOUNTER — Encounter: Payer: Self-pay | Admitting: Emergency Medicine

## 2019-10-15 ENCOUNTER — Inpatient Hospital Stay: Payer: PRIVATE HEALTH INSURANCE

## 2019-10-15 ENCOUNTER — Inpatient Hospital Stay
Admission: EM | Admit: 2019-10-15 | Discharge: 2019-10-28 | DRG: 683 | Disposition: A | Discharge status: Home / Self Care | Payer: PRIVATE HEALTH INSURANCE | Attending: Internal Medicine | Admitting: Internal Medicine

## 2019-10-15 DIAGNOSIS — Z8619 Personal history of other infectious and parasitic diseases: Secondary | ICD-10-CM

## 2019-10-15 DIAGNOSIS — A419 Sepsis, unspecified organism: Secondary | ICD-10-CM | POA: Diagnosis not present

## 2019-10-15 DIAGNOSIS — E860 Dehydration: Secondary | ICD-10-CM | POA: Diagnosis present

## 2019-10-15 DIAGNOSIS — F418 Other specified anxiety disorders: Secondary | ICD-10-CM | POA: Diagnosis present

## 2019-10-15 DIAGNOSIS — M26622 Arthralgia of left temporomandibular joint: Secondary | ICD-10-CM | POA: Diagnosis not present

## 2019-10-15 DIAGNOSIS — Z808 Family history of malignant neoplasm of other organs or systems: Secondary | ICD-10-CM

## 2019-10-15 DIAGNOSIS — R197 Diarrhea, unspecified: Secondary | ICD-10-CM | POA: Diagnosis not present

## 2019-10-15 DIAGNOSIS — E7800 Pure hypercholesterolemia, unspecified: Secondary | ICD-10-CM | POA: Diagnosis present

## 2019-10-15 DIAGNOSIS — R519 Headache, unspecified: Secondary | ICD-10-CM | POA: Diagnosis not present

## 2019-10-15 DIAGNOSIS — N170 Acute kidney failure with tubular necrosis: Principal | ICD-10-CM | POA: Diagnosis present

## 2019-10-15 DIAGNOSIS — I10 Essential (primary) hypertension: Secondary | ICD-10-CM | POA: Diagnosis present

## 2019-10-15 DIAGNOSIS — Z87440 Personal history of urinary (tract) infections: Secondary | ICD-10-CM

## 2019-10-15 DIAGNOSIS — Z6830 Body mass index (BMI) 30.0-30.9, adult: Secondary | ICD-10-CM

## 2019-10-15 DIAGNOSIS — Z79899 Other long term (current) drug therapy: Secondary | ICD-10-CM | POA: Diagnosis not present

## 2019-10-15 DIAGNOSIS — I959 Hypotension, unspecified: Secondary | ICD-10-CM

## 2019-10-15 DIAGNOSIS — R6520 Severe sepsis without septic shock: Secondary | ICD-10-CM | POA: Diagnosis not present

## 2019-10-15 DIAGNOSIS — E861 Hypovolemia: Secondary | ICD-10-CM | POA: Diagnosis present

## 2019-10-15 DIAGNOSIS — Z20822 Contact with and (suspected) exposure to covid-19: Secondary | ICD-10-CM | POA: Diagnosis present

## 2019-10-15 DIAGNOSIS — H538 Other visual disturbances: Secondary | ICD-10-CM

## 2019-10-15 DIAGNOSIS — B001 Herpesviral vesicular dermatitis: Secondary | ICD-10-CM | POA: Diagnosis present

## 2019-10-15 DIAGNOSIS — E876 Hypokalemia: Secondary | ICD-10-CM | POA: Diagnosis present

## 2019-10-15 DIAGNOSIS — H4922 Sixth [abducent] nerve palsy, left eye: Secondary | ICD-10-CM | POA: Diagnosis not present

## 2019-10-15 DIAGNOSIS — Z800 Family history of malignant neoplasm of digestive organs: Secondary | ICD-10-CM | POA: Diagnosis not present

## 2019-10-15 DIAGNOSIS — R109 Unspecified abdominal pain: Secondary | ICD-10-CM | POA: Diagnosis not present

## 2019-10-15 DIAGNOSIS — G500 Trigeminal neuralgia: Secondary | ICD-10-CM | POA: Diagnosis present

## 2019-10-15 DIAGNOSIS — E785 Hyperlipidemia, unspecified: Secondary | ICD-10-CM | POA: Diagnosis present

## 2019-10-15 DIAGNOSIS — N179 Acute kidney failure, unspecified: Secondary | ICD-10-CM | POA: Diagnosis present

## 2019-10-15 DIAGNOSIS — D649 Anemia, unspecified: Secondary | ICD-10-CM | POA: Diagnosis present

## 2019-10-15 DIAGNOSIS — T502X5A Adverse effect of carbonic-anhydrase inhibitors, benzothiadiazides and other diuretics, initial encounter: Secondary | ICD-10-CM | POA: Diagnosis present

## 2019-10-15 DIAGNOSIS — R55 Syncope and collapse: Secondary | ICD-10-CM

## 2019-10-15 DIAGNOSIS — K529 Noninfective gastroenteritis and colitis, unspecified: Secondary | ICD-10-CM | POA: Diagnosis present

## 2019-10-15 DIAGNOSIS — E872 Acidosis, unspecified: Secondary | ICD-10-CM | POA: Diagnosis present

## 2019-10-15 DIAGNOSIS — G44209 Tension-type headache, unspecified, not intractable: Secondary | ICD-10-CM | POA: Diagnosis present

## 2019-10-15 DIAGNOSIS — E669 Obesity, unspecified: Secondary | ICD-10-CM | POA: Diagnosis present

## 2019-10-15 DIAGNOSIS — Z807 Family history of other malignant neoplasms of lymphoid, hematopoietic and related tissues: Secondary | ICD-10-CM

## 2019-10-15 DIAGNOSIS — R000 Tachycardia, unspecified: Secondary | ICD-10-CM | POA: Diagnosis present

## 2019-10-15 DIAGNOSIS — E871 Hypo-osmolality and hyponatremia: Secondary | ICD-10-CM | POA: Diagnosis present

## 2019-10-15 LAB — COMPREHENSIVE METABOLIC PANEL
ALT: 40 U/L (ref 0–44)
AST: 34 U/L (ref 15–41)
Albumin: 4.3 g/dL (ref 3.5–5.0)
Alkaline Phosphatase: 70 U/L (ref 38–126)
Anion gap: 12 (ref 5–15)
BUN: 18 mg/dL (ref 6–20)
CO2: 19 mmol/L — ABNORMAL LOW (ref 22–32)
Calcium: 9.5 mg/dL (ref 8.9–10.3)
Chloride: 99 mmol/L (ref 98–111)
Creatinine, Ser: 2.03 mg/dL — ABNORMAL HIGH (ref 0.44–1.00)
GFR calc Af Amer: 36 mL/min — ABNORMAL LOW (ref >60)
GFR calc non Af Amer: 31 mL/min — ABNORMAL LOW (ref >60)
Glucose, Bld: 122 mg/dL — ABNORMAL HIGH (ref 70–99)
Potassium: 3.4 mmol/L — ABNORMAL LOW (ref 3.5–5.1)
Sodium: 130 mmol/L — ABNORMAL LOW (ref 135–145)
Total Bilirubin: 0.6 mg/dL (ref 0.3–1.2)
Total Protein: 7.6 g/dL (ref 6.5–8.1)

## 2019-10-15 LAB — GASTROINTESTINAL PANEL BY PCR, STOOL (REPLACES STOOL CULTURE)

## 2019-10-15 LAB — POCT PREGNANCY, URINE: Preg Test, Ur: NEGATIVE

## 2019-10-15 LAB — SARS CORONAVIRUS 2 BY RT PCR (HOSPITAL ORDER, PERFORMED IN ~~LOC~~ HOSPITAL LAB): SARS Coronavirus 2: NEGATIVE

## 2019-10-15 LAB — CBC
HCT: 39.9 % (ref 36.0–46.0)
Hemoglobin: 13.5 g/dL (ref 12.0–15.0)
MCH: 28.8 pg (ref 26.0–34.0)
MCHC: 33.8 g/dL (ref 30.0–36.0)
MCV: 85.1 fL (ref 80.0–100.0)
Platelets: 495 10*3/uL — ABNORMAL HIGH (ref 150–400)
RBC: 4.69 MIL/uL (ref 3.87–5.11)
RDW: 12.7 % (ref 11.5–15.5)
WBC: 16 10*3/uL — ABNORMAL HIGH (ref 4.0–10.5)
nRBC: 0 % (ref 0.0–0.2)

## 2019-10-15 LAB — URINALYSIS, COMPLETE (UACMP) WITH MICROSCOPIC
Bilirubin Urine: NEGATIVE
Glucose, UA: NEGATIVE mg/dL
Hgb urine dipstick: NEGATIVE
Ketones, ur: NEGATIVE mg/dL
Leukocytes,Ua: NEGATIVE
Nitrite: NEGATIVE
Protein, ur: NEGATIVE mg/dL
Specific Gravity, Urine: 1.004 — ABNORMAL LOW (ref 1.005–1.030)
pH: 6 (ref 5.0–8.0)

## 2019-10-15 LAB — C DIFFICILE QUICK SCREEN W PCR REFLEX
C Diff antigen: NEGATIVE
C Diff interpretation: NOT DETECTED
C Diff toxin: NEGATIVE

## 2019-10-15 LAB — LIPASE, BLOOD: Lipase: 41 U/L (ref 11–51)

## 2019-10-15 LAB — LACTIC ACID, PLASMA
Lactic Acid, Venous: 2.8 mmol/L (ref 0.5–1.9)
Lactic Acid, Venous: 2.1 mmol/L (ref 0.5–1.9)

## 2019-10-15 LAB — TROPONIN I (HIGH SENSITIVITY)
Troponin I (High Sensitivity): 3 ng/L (ref <18)
Troponin I (High Sensitivity): <2 ng/L (ref <18)

## 2019-10-15 MED ORDER — AMPHETAMINE-DEXTROAMPHETAMINE 20 MG PO TABS: 20.0000 mg | ORAL_TABLET | Freq: Two times a day (BID) | ORAL | Status: DC

## 2019-10-15 MED ORDER — CYCLOBENZAPRINE HCL 10 MG PO TABS
5.0000 mg | ORAL_TABLET | Freq: Three times a day (TID) | ORAL | Status: DC
  Administered 2019-10-15 – 2019-10-28 (×38): 5 mg via ORAL
  Filled 2019-10-15 (×39): qty 1

## 2019-10-15 MED ORDER — DICYCLOMINE HCL 20 MG PO TABS
20.0000 mg | ORAL_TABLET | Freq: Three times a day (TID) | ORAL | Status: DC
  Administered 2019-10-15 – 2019-10-28 (×37): 20 mg via ORAL
  Filled 2019-10-15 (×40): qty 1

## 2019-10-15 MED ORDER — LOPERAMIDE HCL 2 MG PO CAPS: 2.0000 mg | ORAL_CAPSULE | ORAL | Status: DC | PRN

## 2019-10-15 MED ORDER — PANTOPRAZOLE SODIUM 40 MG PO TBEC
40.0000 mg | DELAYED_RELEASE_TABLET | Freq: Two times a day (BID) | ORAL | Status: DC
  Administered 2019-10-15 – 2019-10-28 (×26): 40 mg via ORAL
  Filled 2019-10-15 (×26): qty 1

## 2019-10-15 MED ORDER — B COMPLEX-C PO TABS
1.0000 | ORAL_TABLET | Freq: Every day | ORAL | Status: DC
  Administered 2019-10-15 – 2019-10-28 (×14): 1 via ORAL
  Filled 2019-10-15 (×14): qty 1

## 2019-10-15 MED ORDER — HYDROCODONE-ACETAMINOPHEN 5-325 MG PO TABS
1.0000 | ORAL_TABLET | Freq: Four times a day (QID) | ORAL | Status: DC | PRN
  Administered 2019-10-15 – 2019-10-16 (×3): 1 via ORAL
  Filled 2019-10-15 (×3): qty 1

## 2019-10-15 MED ORDER — ONDANSETRON HCL 4 MG/2ML IJ SOLN
4.0000 mg | Freq: Four times a day (QID) | INTRAMUSCULAR | Status: DC | PRN
  Administered 2019-10-15 – 2019-10-27 (×7): 4 mg via INTRAVENOUS
  Filled 2019-10-15 (×7): qty 2

## 2019-10-15 MED ORDER — AMPHETAMINE-DEXTROAMPHET ER 30 MG PO CP24
30.0000 mg | ORAL_CAPSULE | Freq: Every day | ORAL | Status: DC
  Filled 2019-10-15 (×6): qty 1

## 2019-10-15 MED ORDER — SODIUM CHLORIDE 0.9 % IV BOLUS
1000.0000 mL | Freq: Once | INTRAVENOUS | Status: AC
  Administered 2019-10-15: 1000 mL via INTRAVENOUS

## 2019-10-15 MED ORDER — ENOXAPARIN SODIUM 40 MG/0.4ML ~~LOC~~ SOLN
40.0000 mg | SUBCUTANEOUS | Status: DC
  Administered 2019-10-15 – 2019-10-27 (×13): 40 mg via SUBCUTANEOUS
  Filled 2019-10-15 (×13): qty 0.4

## 2019-10-15 MED ORDER — VITAMIN B-12 1000 MCG PO TABS
1000.0000 ug | ORAL_TABLET | Freq: Every day | ORAL | Status: DC
  Administered 2019-10-15 – 2019-10-28 (×14): 1000 ug via ORAL
  Filled 2019-10-15 (×14): qty 1

## 2019-10-15 MED ORDER — ATORVASTATIN CALCIUM 20 MG PO TABS
20.0000 mg | ORAL_TABLET | Freq: Every day | ORAL | Status: DC
  Administered 2019-10-15 – 2019-10-28 (×14): 20 mg via ORAL
  Filled 2019-10-15 (×14): qty 1

## 2019-10-15 MED ORDER — HYDROXYZINE PAMOATE 25 MG PO CAPS: 25.0000 mg | ORAL_CAPSULE | Freq: Four times a day (QID) | ORAL | Status: DC | PRN

## 2019-10-15 MED ORDER — ONDANSETRON HCL 4 MG/2ML IJ SOLN
4.0000 mg | Freq: Once | INTRAMUSCULAR | Status: AC
  Administered 2019-10-15: 4 mg via INTRAVENOUS
  Filled 2019-10-15: qty 2

## 2019-10-15 MED ORDER — SODIUM CHLORIDE 0.9% FLUSH
3.0000 mL | Freq: Two times a day (BID) | INTRAVENOUS | Status: DC
  Administered 2019-10-15 – 2019-10-28 (×16): 3 mL via INTRAVENOUS

## 2019-10-15 MED ORDER — ONDANSETRON HCL 4 MG PO TABS
4.0000 mg | ORAL_TABLET | Freq: Four times a day (QID) | ORAL | Status: DC | PRN
  Administered 2019-10-18 – 2019-10-27 (×4): 4 mg via ORAL
  Filled 2019-10-15 (×5): qty 1

## 2019-10-15 MED ORDER — ALPRAZOLAM 0.5 MG PO TABS
1.0000 mg | ORAL_TABLET | Freq: Three times a day (TID) | ORAL | Status: DC | PRN
  Administered 2019-10-16: 1 mg via ORAL
  Filled 2019-10-15: qty 2

## 2019-10-15 MED ORDER — POTASSIUM CHLORIDE IN NACL 40-0.9 MEQ/L-% IV SOLN
INTRAVENOUS | Status: DC
  Administered 2019-10-15 – 2019-10-20 (×11): 125 mL/h via INTRAVENOUS
  Filled 2019-10-15 (×17): qty 1000

## 2019-10-15 MED ORDER — AMITRIPTYLINE HCL 50 MG PO TABS: 50.0000 mg | ORAL_TABLET | Freq: Every day | ORAL | Status: DC

## 2019-10-15 MED ORDER — FOLIC ACID 1 MG PO TABS
1.0000 mg | ORAL_TABLET | Freq: Every day | ORAL | Status: DC
  Administered 2019-10-15 – 2019-10-28 (×14): 1 mg via ORAL
  Filled 2019-10-15 (×14): qty 1

## 2019-10-15 MED ORDER — SODIUM CHLORIDE 0.9 % IV SOLN: INTRAVENOUS | Status: DC

## 2019-10-15 MED ORDER — DULOXETINE HCL 30 MG PO CPEP
60.0000 mg | ORAL_CAPSULE | Freq: Every day | ORAL | Status: DC
  Administered 2019-10-15 – 2019-10-28 (×14): 60 mg via ORAL
  Filled 2019-10-15 (×6): qty 2
  Filled 2019-10-15: qty 1
  Filled 2019-10-15 (×7): qty 2

## 2019-10-15 MED ORDER — IOHEXOL 9 MG/ML PO SOLN
500.0000 mL | ORAL | Status: AC
  Administered 2019-10-15 (×2): 500 mL via ORAL

## 2019-10-15 NOTE — ED Notes (Signed)
Asked pharmacy to retime adderall.

## 2019-10-15 NOTE — ED Provider Notes (Signed)
[facility] Emergency Department Provider Note   ____________________________________________   First MD Initiated Contact with Patient 10/15/19 [phone]     (approximate)  I have reviewed the triage vital signs and the nursing notes.   HISTORY  Chief Complaint Loss of Consciousness    HPI Molly Cisneros is a 34 y.o. female who presents to the ED from home with a chief complaint of diarrhea and syncope.  Patient reports chronic diarrhea since April 2020.  She has had extensive negative GI work-up in the past. States diarrhea increased over the last 3 days.  Has had one episode of nausea and vomiting.  Prior to arrival patient had 2 syncopal episodes, 1 trying to get up from the commode and another while ambulating in her hallway.  Denies striking head.  Denies fever, chills, chest pain, shortness of breath.  Reports mid abdominal pain. Took Macrobid for UTI several weeks ago.  Denies recent travel or trauma.       Past Medical History:  Diagnosis Date  . Acute appendicitis with localized peritonitis 11/06/2018  . Anxiety   . C. difficile diarrhea 02/05/2019  . Clostridium difficile diarrhea   . High cholesterol   . Hypertension   . Hypokalemia 02/05/2019  . Loss of weight   . Severe sepsis ([place]) 02/05/2019  . Shingles    reoccuring shingles on face    Patient Active Problem List   Diagnosis Date Noted  . Vitamin D deficiency 07/11/2019  . Sepsis ([place]) 07/01/2019  . AKI (acute kidney injury) ([place]) 07/01/2019  . GERD (gastroesophageal reflux disease) 07/01/2019  . Depression with anxiety 07/01/2019  . Dehydration   . Folate deficiency 06/30/2019  . B12 deficiency 06/30/2019  . Orthostasis 06/21/2019  . Hypochloremia   . Hyponatremia 02/13/2019  . Hypokalemia 02/05/2019  . Chronic diarrhea of unknown origin   . Essential hypertension 11/03/2018  . Hyperlipidemia, mixed 11/03/2018  . Degeneration of C5-C6 intervertebral disc 08/31/2018  . Herniation  of left side of L4-L5 intervertebral disc 06/05/2017  . Obesity (BMI 30-39.9) 06/05/2017  . Chronic left-sided low back pain without sciatica 07/23/2015    Past Surgical History:  Procedure Laterality Date  . COLONOSCOPY WITH PROPOFOL N/A 12/23/2018   Procedure: COLONOSCOPY WITH PROPOFOL;  Surgeon: Lin Landsman, MD;  Location: [facility] ENDOSCOPY;  Service: Gastroenterology;  Laterality: N/A;  . ESOPHAGOGASTRODUODENOSCOPY (EGD) WITH PROPOFOL N/A 12/23/2018   Procedure: ESOPHAGOGASTRODUODENOSCOPY (EGD) WITH PROPOFOL;  Surgeon: Lin Landsman, MD;  Location[facility] ENDOSCOPY;  Service: Gastroenterology;  Laterality: N/A;  . GIVENS CAPSULE STUDY N/A 06/14/2019   Procedure: GIVENS CAPSULE STUDY;  Surgeon: Lin Landsman, MD;  Location: [facility] ENDOSCOPY;  Service: Gastroenterology;  Laterality: N/A;  . GIVENS CAPSULE STUDY N/A 06/16/2019   Procedure: GIVENS CAPSULE STUDY;  Surgeon: Lin Landsman, MD;  Location: [facility] ENDOSCOPY;  Service: Gastroenterology;  Laterality: N/A;  . LAPAROSCOPIC APPENDECTOMY N/A 11/06/2018   Procedure: APPENDECTOMY LAPAROSCOPIC;  Surgeon: Herbert Pun, MD;  Location: [facility] ORS;  Service: General;  Laterality: N/A;    Prior to Admission medications   Medication Sig Start Date End Date Taking? Authorizing Provider  ALPRAZolam Duanne Moron) 1 MG tablet Take 1 mg by mouth 3 (three) times daily as needed for anxiety.     [provider]  amitriptyline (ELAVIL) 50 MG tablet Take 50 mg by mouth at bedtime.  01/03/19   [provider]  amitriptyline (ELAVIL) 50 MG tablet Take 1.5 tablets (75 mg total) by mouth at bedtime. 07/12/19  08/11/19  Lin Landsman, MD  amphetamine-dextroamphetamine (ADDERALL XR) 30 MG 24 hr capsule Take 30 mg by mouth daily.  09/27/18   [provider]  amphetamine-dextroamphetamine (ADDERALL) 20 MG tablet Take 20 mg by mouth 2 (two) times daily.    [provider]  atorvastatin (LIPITOR) 20 MG tablet TAKE  (1) TABLET BY MOUTH EVERY DAY 09/19/19   Glean Hess, MD  B Complex-C (B-COMPLEX WITH VITAMIN C) tablet Take 1 tablet by mouth daily. 07/11/19   Dhungel, Flonnie Overman, MD  cyclobenzaprine (FLEXERIL) 5 MG tablet Take 1 tablet (5 mg total) by mouth 3 (three) times daily. 09/19/19   Glean Hess, MD  dicyclomine (BENTYL) 20 MG tablet TAKE (1) TABLET BY MOUTH THREE TIMES A DAY BEFORE MEALS 09/06/19   Vanga, Tally Due, MD  diphenoxylate-atropine (LOMOTIL) 2.5-0.025 MG tablet Take 2 tablets by mouth 4 (four) times daily as needed for diarrhea or loose stools. 07/11/19   Dhungel, Flonnie Overman, MD  DULoxetine (CYMBALTA) 60 MG capsule Take 60 mg by mouth daily.  07/18/15   [provider]  etonogestrel-ethinyl estradiol (NUVARING) 0.12-0.015 MG/24HR vaginal ring USE AS DIRECTED 10/04/19   Glean Hess, MD  feeding supplement, ENSURE ENLIVE, (ENSURE ENLIVE) LIQD Take 237 mLs by mouth 2 (two) times daily between meals. 07/11/19   Dhungel, Flonnie Overman, MD  folic acid (FOLVITE) 1 MG tablet Take 1 tablet (1 mg total) by mouth daily. 07/11/19   Dhungel, Nishant, MD  gabapentin (NEURONTIN) 300 MG capsule TAKE (1) CAPSULE BY MOUTH TWICE DAILY Patient taking differently: Take 300 mg by mouth 2 (two) times daily as needed (pain).  05/16/19   Glean Hess, MD  hydrOXYzine (VISTARIL) 25 MG capsule Take 1-2 capsules (25-50 mg total) by mouth 4 (four) times daily as needed for anxiety or itching. 10/04/19   Glean Hess, MD  lisinopril-hydrochlorothiazide (ZESTORETIC) 20-25 MG tablet TAKE (1) TABLET BY MOUTH EVERY DAY 09/09/19   Glean Hess, MD  Octreotide Acetate 200 MCG/ML SOLN Inject 0.5 mLs (100 mcg total) as directed every 8 (eight) hours as needed (diarrhea). 07/05/19   Lequita Asal, MD  ondansetron (ZOFRAN-ODT) 8 MG disintegrating tablet TAKE 1 TABLET BY MOUTH EVERY 8 HOURS AS NEEDED FOR NAUSEA OR VOMITING. 06/28/19   Glean Hess, MD  pantoprazole (PROTONIX) 40 MG tablet TAKE ONE TABLET BY  MOUTH TWICE DAILY. 30MINUTES PRIOR TO MEALS Patient taking differently: Take 40 mg by mouth 2 (two) times daily before a meal.  05/04/19   Glean Hess, MD  potassium chloride 20 MEQ/15ML (10%) SOLN Take 15 mLs (20 mEq total) by mouth 2 (two) times daily. 06/21/19   Glean Hess, MD  promethazine (PHENERGAN) 25 MG tablet Take 1 tablet (25 mg total) by mouth every 8 (eight) hours as needed for nausea or vomiting. 09/19/19   Glean Hess, MD  traMADol (ULTRAM) 50 MG tablet Take 1 tablet (50 mg total) by mouth every 6 (six) hours as needed. 07/11/19   Dhungel, Flonnie Overman, MD  TUBERCULIN SYR 1CC/27GX1/2" 27G X 1/2" 1 ML MISC Use for octreotide injection every 8 hours as needed for diarrhea 07/05/19   Lequita Asal, MD  valACYclovir (VALTREX) 1000 MG tablet Take 1,000 mg by mouth 2 (two) times daily as needed.    [provider]  vitamin B-12 1000 MCG tablet Take 1 tablet (1,000 mcg total) by mouth daily. 07/11/19   Dhungel, Flonnie Overman, MD  amitriptyline (ELAVIL) 25 MG tablet TAKE (1) TABLET BY  MOUTH DAILY AT BEDTIME 11/23/18   Vanga, Tally Due, MD  dicyclomine (BENTYL) 10 MG capsule TAKE (1) CAPSULE BY MOUTH FOUR TIMES A DAY BEFORE MEALS AND AT BEDTIME. Patient taking differently: Take 10 mg by mouth 4 (four) times daily.  12/21/18   Lin Landsman, MD    Allergies Shellfish allergy and Aloe vera  Family History  Problem Relation Age of Onset  . Non-Hodgkin's lymphoma Father 6       Basil Cell  . Pancreatic cancer Maternal Grandmother 56  . Thyroid cancer Maternal Grandmother 23  . Throat cancer Paternal Grandfather 95    Social History Social History   Tobacco Use  . Smoking status: Never Smoker  . Smokeless tobacco: Never Used  Substance Use Topics  . Alcohol use: Yes    Alcohol/week: 2.0 standard drinks    Types: 2 Cans of beer per week  . Drug use: No    Review of Systems  Constitutional: No fever/chills Eyes: No visual changes. ENT: No sore  throat. Cardiovascular: Denies chest pain. Respiratory: Denies shortness of breath. Gastrointestinal: Positive for abdominal pain.  No nausea, no vomiting.  Positive for diarrhea.  No constipation. Genitourinary: Negative for dysuria. Musculoskeletal: Negative for back pain. Skin: Negative for rash. Neurological: Positive for syncope.  Negative for headaches, focal weakness or numbness.   ____________________________________________   PHYSICAL EXAM:  VITAL SIGNS: ED Triage Vitals  Enc Vitals Group     BP 10/15/19 0637 (!) 65/38     Pulse Rate 10/15/19 0637 (!) 117     Resp 10/15/19 0637 18     Temp 10/15/19 0637 98.2 F (36.8 C)     Temp Source 10/15/19 0637 Oral     SpO2 10/15/19 0637 99 %     Weight 10/15/19 0638 215 lb 6.2 oz (97.7 kg)     Height 10/15/19 0638 5\' 11"  (1.803 m)     Head Circumference --      Peak Flow --      Pain Score 10/15/19 0638 8     Pain Loc --      Pain Edu? --      Excl. in [place]? --     Constitutional: Alert and oriented.  Pale appearing and in mild acute distress. Eyes: Conjunctivae are normal. PERRL. EOMI. Head: Atraumatic. Nose: No congestion/rhinnorhea. Mouth/Throat: Mucous membranes are mildly dry. Neck: No stridor.  No cervical spine tenderness to palpation. Cardiovascular: Tachycardic rate, regular rhythm. Grossly normal heart sounds.  Good peripheral circulation. Respiratory: Normal respiratory effort.  No retractions. Lungs CTAB. Gastrointestinal: Soft and mildly tender to palpation umbilicus without rebound or guarding. No distention. No abdominal bruits. No CVA tenderness. Musculoskeletal: No lower extremity tenderness nor edema.  No joint effusions. Neurologic:  Normal speech and language. No gross focal neurologic deficits are appreciated.  Skin:  Skin is pale, warm, dry and intact. No rash noted. Psychiatric: Mood and affect are normal. Speech and behavior are normal.  ____________________________________________   LABS (all  labs ordered are listed, but only abnormal results are displayed)  Labs Reviewed  C DIFFICILE QUICK SCREEN W PCR REFLEX  GASTROINTESTINAL PANEL BY PCR, STOOL (REPLACES STOOL CULTURE)  SARS CORONAVIRUS 2 BY RT PCR (HOSPITAL ORDER, [place])  COMPREHENSIVE METABOLIC PANEL  CBC  URINALYSIS, COMPLETE (UACMP) WITH MICROSCOPIC  LIPASE, BLOOD  LACTIC ACID, PLASMA  LACTIC ACID, PLASMA  POC URINE PREG, ED  TROPONIN I (HIGH SENSITIVITY)   ____________________________________________  EKG  ED ECG  REPORT I, Charlane Westry J, the attending physician, personally viewed and interpreted this ECG.   Date: 10/15/2019  EKG Time: 0646  Rate: 98  Rhythm: normal EKG, normal sinus rhythm  Axis: Normal  Intervals:none  ST&T Change: Nonspecific  ____________________________________________  RADIOLOGY  ED MD interpretation: None  Official radiology report(s): No results found.  ____________________________________________   PROCEDURES  Procedure(s) performed (including Critical Care):  .1-3 Lead EKG Interpretation Performed by: Paulette Blanch, MD Authorized by: Paulette Blanch, MD     Interpretation: abnormal     ECG rate:  115   ECG rate assessment: tachycardic     Rhythm: sinus tachycardia     Ectopy: none     Conduction: normal   Comments:     Patient placed on cardiac monitor to monitor for arrhythmias     ____________________________________________   INITIAL IMPRESSION / ASSESSMENT AND PLAN / ED COURSE  As part of my medical decision making, I reviewed the following data within the [place] notes reviewed and incorporated, Labs reviewed, EKG interpreted, Old chart reviewed and Notes from prior ED visits     Tajae E Weinstock was evaluated in Emergency Department on 10/15/2019 for the symptoms described in the history of present illness. She was evaluated in the context of the global COVID-19 pandemic, which necessitated  consideration that the patient might be at risk for infection with the SARS-CoV-2 virus that causes COVID-19. Institutional protocols and algorithms that pertain to the evaluation of patients at risk for COVID-19 are in a state of rapid change based on information released by regulatory bodies including the CDC and federal and state organizations. These policies and algorithms were followed during the patient's care in the ED.    34 year old female presenting with diarrhea and syncope. Differential diagnosis includes, but is not limited to, ovarian cyst, ovarian torsion, acute appendicitis, diverticulitis, urinary tract infection/pyelonephritis, endometriosis, bowel obstruction, colitis, renal colic, gastroenteritis, hernia, fibroids, endometriosis, pregnancy related pain including ectopic pregnancy, etc.  Will obtain lab work, UA, stool studies.  Initiate IV fluid resuscitation, IV Zofran for nausea.   Clinical Course as of Oct 14 698  Sat Oct 15, 2019  0700 Care transferred to Dr. Corky Downs at change of shifts pending all labwork and reassessment.   [JS]    Clinical Course User Index [JS] Paulette Blanch, MD     ____________________________________________   FINAL CLINICAL IMPRESSION(S) / ED DIAGNOSES  Final diagnoses:  Syncope, unspecified syncope type  Hypotension, unspecified hypotension type  Diarrhea, unspecified type     ED Discharge Orders    None       Note:  This document was prepared using Dragon voice recognition software and may include unintentional dictation errors.   Paulette Blanch, MD 10/15/19 [phone]

## 2019-10-15 NOTE — H&P (Addendum)
History and Physical    Molly Cisneros [id number] DOB: 09/23/1985 DOA: 10/15/2019  PCP: Glean Hess, MD   Patient coming from: Home  I have personally briefly reviewed patient's old medical records in [place]  Chief Complaint: " I passed out"  HPI: Molly Cisneros is a 34 y.o. female with medical history significant for hypertension, dyslipidemia and anxiety who presents to the emergency room after she had 2 syncopal episodes at home.  Patient states that she has been having loose watery stools since April, 2020 and usually has bouts where it gets worse with increased frequency resulting in dehydration requiring hospitalization.  Per patient frequency of diarrhea increased over the last 3 days and has been associated with nausea and vomiting as well as abdominal pain mostly in the periumbilical area.  Abdominal pain is spasmodic, intermittent and rated 6 x 10 in intensity at its worst.   Patient had 2 syncopal episodes this morning, one while trying to get up from the commode and another while ambulating in her hallway.  She denies striking her head.  She denies having any fever, chills, chest pain, shortness of breath, headache or any mental status changes.   When she arrived in the emergency room she was noted to be hypotensive with systolic blood pressure of 38mmHg. She was also tachycardic with heart rate of 117bpm.  She received 3 L IV fluid bolus in the ER with improvement in her blood pressures. Patient's labs reveal leukocytosis with a white count of 16,000, serum creatinine of 2.03 compared to baseline of 1, sodium of 130 and potassium of 3.4 with a lactate level of 2.8. Patient has had multiple imaging studies in the past for evaluation of chronic diarrhea and abdominal pain which essentially have been negative.   ED Course: 34 year old female with a history of chronic diarrhea who presents to the emergency room after she had 2 syncopal episodes.  Patient was hypotensive upon  arrival and is noted to have electrolyte abnormalities.  She will be admitted to the hospital for further evaluation.  Review of Systems: As per HPI otherwise 10 point review of systems negative.    Past Medical History:  Diagnosis Date  . Acute appendicitis with localized peritonitis 11/06/2018  . Anxiety   . C. difficile diarrhea 02/05/2019  . Clostridium difficile diarrhea   . High cholesterol   . Hypertension   . Hypokalemia 02/05/2019  . Loss of weight   . Severe sepsis ([place]) 02/05/2019  . Shingles    reoccuring shingles on face    Past Surgical History:  Procedure Laterality Date  . COLONOSCOPY WITH PROPOFOL N/A 12/23/2018   Procedure: COLONOSCOPY WITH PROPOFOL;  Surgeon: Lin Landsman, MD;  Location: [facility] ENDOSCOPY;  Service: Gastroenterology;  Laterality: N/A;  . ESOPHAGOGASTRODUODENOSCOPY (EGD) WITH PROPOFOL N/A 12/23/2018   Procedure: ESOPHAGOGASTRODUODENOSCOPY (EGD) WITH PROPOFOL;  Surgeon: Lin Landsman, MD;  Location: [facility] ENDOSCOPY;  Service: Gastroenterology;  Laterality: N/A;  . GIVENS CAPSULE STUDY N/A 06/14/2019   Procedure: GIVENS CAPSULE STUDY;  Surgeon: Lin Landsman, MD;  Location: [facility] ENDOSCOPY;  Service: Gastroenterology;  Laterality: N/A;  . GIVENS CAPSULE STUDY N/A 06/16/2019   Procedure: GIVENS CAPSULE STUDY;  Surgeon: Lin Landsman, MD;  Location: [facility] ENDOSCOPY;  Service: Gastroenterology;  Laterality: N/A;  . LAPAROSCOPIC APPENDECTOMY N/A 11/06/2018   Procedure: APPENDECTOMY LAPAROSCOPIC;  Surgeon: Herbert Pun, MD;  Location: [facility] ORS;  Service: General;  Laterality: N/A;     reports that she has never  smoked. She has never used smokeless tobacco. She reports current alcohol use of about 2.0 standard drinks of alcohol per week. She reports that she does not use drugs.  Allergies  Allergen Reactions  . Shellfish Allergy Anaphylaxis  . Aloe Vera Dermatitis    Family History  Problem Relation Age of Onset  . Non-Hodgkin's  lymphoma Father 67       Basil Cell  . Pancreatic cancer Maternal Grandmother 15  . Thyroid cancer Maternal Grandmother 74  . Throat cancer Paternal Grandfather 75     Prior to Admission medications   Medication Sig Start Date End Date Taking? Authorizing Provider  ALPRAZolam Duanne Moron) 1 MG tablet Take 1 mg by mouth 3 (three) times daily as needed for anxiety.     [provider]  amitriptyline (ELAVIL) 50 MG tablet Take 50 mg by mouth at bedtime.  01/03/19   [provider]  amitriptyline (ELAVIL) 50 MG tablet Take 1.5 tablets (75 mg total) by mouth at bedtime. 07/12/19 08/11/19  Lin Landsman, MD  amphetamine-dextroamphetamine (ADDERALL XR) 30 MG 24 hr capsule Take 30 mg by mouth daily.  09/27/18   [provider]  amphetamine-dextroamphetamine (ADDERALL) 20 MG tablet Take 20 mg by mouth 2 (two) times daily.    [provider]  atorvastatin (LIPITOR) 20 MG tablet TAKE (1) TABLET BY MOUTH EVERY DAY 09/19/19   Glean Hess, MD  B Complex-C (B-COMPLEX WITH VITAMIN C) tablet Take 1 tablet by mouth daily. 07/11/19   Dhungel, Flonnie Overman, MD  cyclobenzaprine (FLEXERIL) 5 MG tablet Take 1 tablet (5 mg total) by mouth 3 (three) times daily. 09/19/19   Glean Hess, MD  dicyclomine (BENTYL) 20 MG tablet TAKE (1) TABLET BY MOUTH THREE TIMES A DAY BEFORE MEALS 09/06/19   Vanga, Tally Due, MD  diphenoxylate-atropine (LOMOTIL) 2.5-0.025 MG tablet Take 2 tablets by mouth 4 (four) times daily as needed for diarrhea or loose stools. 07/11/19   Dhungel, Flonnie Overman, MD  DULoxetine (CYMBALTA) 60 MG capsule Take 60 mg by mouth daily.  07/18/15   [provider]  etonogestrel-ethinyl estradiol (NUVARING) 0.12-0.015 MG/24HR vaginal ring USE AS DIRECTED 10/04/19   Glean Hess, MD  feeding supplement, ENSURE ENLIVE, (ENSURE ENLIVE) LIQD Take 237 mLs by mouth 2 (two) times daily between meals. 07/11/19   Dhungel, Flonnie Overman, MD  folic acid (FOLVITE) 1 MG tablet Take 1  tablet (1 mg total) by mouth daily. 07/11/19   Dhungel, Nishant, MD  gabapentin (NEURONTIN) 300 MG capsule TAKE (1) CAPSULE BY MOUTH TWICE DAILY Patient taking differently: Take 300 mg by mouth 2 (two) times daily as needed (pain).  05/16/19   Glean Hess, MD  hydrOXYzine (VISTARIL) 25 MG capsule Take 1-2 capsules (25-50 mg total) by mouth 4 (four) times daily as needed for anxiety or itching. 10/04/19   Glean Hess, MD  lisinopril-hydrochlorothiazide (ZESTORETIC) 20-25 MG tablet TAKE (1) TABLET BY MOUTH EVERY DAY 09/09/19   Glean Hess, MD  Octreotide Acetate 200 MCG/ML SOLN Inject 0.5 mLs (100 mcg total) as directed every 8 (eight) hours as needed (diarrhea). 07/05/19   Lequita Asal, MD  ondansetron (ZOFRAN-ODT) 8 MG disintegrating tablet TAKE 1 TABLET BY MOUTH EVERY 8 HOURS AS NEEDED FOR NAUSEA OR VOMITING. 06/28/19   Glean Hess, MD  pantoprazole (PROTONIX) 40 MG tablet TAKE ONE TABLET BY MOUTH TWICE DAILY. 30MINUTES PRIOR TO MEALS Patient taking differently: Take 40 mg by mouth 2 (two) times daily before a meal.  05/04/19   Glean Hess, MD  potassium chloride 20 MEQ/15ML (10%) SOLN Take 15 mLs (20 mEq total) by mouth 2 (two) times daily. 06/21/19   Glean Hess, MD  promethazine (PHENERGAN) 25 MG tablet Take 1 tablet (25 mg total) by mouth every 8 (eight) hours as needed for nausea or vomiting. 09/19/19   Glean Hess, MD  traMADol (ULTRAM) 50 MG tablet Take 1 tablet (50 mg total) by mouth every 6 (six) hours as needed. 07/11/19   Dhungel, Flonnie Overman, MD  TUBERCULIN SYR 1CC/27GX1/2" 27G X 1/2" 1 ML MISC Use for octreotide injection every 8 hours as needed for diarrhea 07/05/19   Lequita Asal, MD  valACYclovir (VALTREX) 1000 MG tablet Take 1,000 mg by mouth 2 (two) times daily as needed.    [provider]  vitamin B-12 1000 MCG tablet Take 1 tablet (1,000 mcg total) by mouth daily. 07/11/19   Dhungel, Flonnie Overman, MD  amitriptyline (ELAVIL) 25 MG tablet  TAKE (1) TABLET BY MOUTH DAILY AT BEDTIME 11/23/18   Vanga, Tally Due, MD  dicyclomine (BENTYL) 10 MG capsule TAKE (1) CAPSULE BY MOUTH FOUR TIMES A DAY BEFORE MEALS AND AT BEDTIME. Patient taking differently: Take 10 mg by mouth 4 (four) times daily.  12/21/18   Lin Landsman, MD    Physical Exam: Vitals:   10/15/19 0800 10/15/19 0815 10/15/19 0830 10/15/19 0845  BP: (!) 86/56 (!) 77/57 (!) 80/57 (!) 78/43  Pulse: 90 92 97 91  Resp: 15 16 18 16   Temp:      TempSrc:      SpO2: 94% 94% 96% 98%  Weight:      Height:         Vitals:   10/15/19 0800 10/15/19 0815 10/15/19 0830 10/15/19 0845  BP: (!) 86/56 (!) 77/57 (!) 80/57 (!) 78/43  Pulse: 90 92 97 91  Resp: 15 16 18 16   Temp:      TempSrc:      SpO2: 94% 94% 96% 98%  Weight:      Height:        Constitutional: NAD, alert and oriented x 3 Eyes: PERRL, lids and conjunctivae normal ENMT: Mucous membranes are dry Neck: normal, supple, no masses, no thyromegaly Respiratory: clear to auscultation bilaterally, no wheezing, no crackles. Normal respiratory effort. No accessory muscle use.  Cardiovascular: Regular rate and rhythm, no murmurs / rubs / gallops. No extremity edema. 2+ pedal pulses. No carotid bruits.  Abdomen: no tenderness, soft, no masses palpated. No hepatosplenomegaly. Bowel sounds positive.  Musculoskeletal: no clubbing / cyanosis. No joint deformity upper and lower extremities.  Skin: no rashes, lesions, ulcers.  Neurologic: No gross focal neurologic deficit. Psychiatric: Normal mood and affect.   Labs on Admission: I have personally reviewed following labs and imaging studies  CBC: Recent Labs  Lab 10/15/19 0645  WBC 16.0*  HGB 13.5  HCT 39.9  MCV 85.1  PLT [id number]*   Basic Metabolic Panel: Recent Labs  Lab 10/14/19 1418 10/15/19 0645  NA 133* 130*  K 3.4* 3.4*  CL 100 99  CO2 22 19*  GLUCOSE 97 122*  BUN 10 18  CREATININE 1.09* 2.03*  CALCIUM 9.3 9.5   GFR: Estimated Creatinine  Clearance: 49.8 mL/min (A) (by C-G formula based on SCr of 2.03 mg/dL (H)). Liver Function Tests: Recent Labs  Lab 10/15/19 0645  AST 34  ALT 40  ALKPHOS 70  BILITOT 0.6  PROT 7.6  ALBUMIN 4.3   Recent  Labs  Lab 10/15/19 0645  LIPASE 41   No results for input(s): AMMONIA in the last 168 hours. Coagulation Profile: No results for input(s): INR, PROTIME in the last 168 hours. Cardiac Enzymes: No results for input(s): CKTOTAL, CKMB, CKMBINDEX, TROPONINI in the last 168 hours. BNP (last 3 results) No results for input(s): PROBNP in the last 8760 hours. HbA1C: No results for input(s): HGBA1C in the last 72 hours. CBG: No results for input(s): GLUCAP in the last 168 hours. Lipid Profile: No results for input(s): CHOL, HDL, LDLCALC, TRIG, CHOLHDL, LDLDIRECT in the last 72 hours. Thyroid Function Tests: No results for input(s): TSH, T4TOTAL, FREET4, T3FREE, THYROIDAB in the last 72 hours. Anemia Panel: No results for input(s): VITAMINB12, FOLATE, FERRITIN, TIBC, IRON, RETICCTPCT in the last 72 hours. Urine analysis:    Component Value Date/Time   COLORURINE YELLOW (A) 07/06/2019 1116   APPEARANCEUR HAZY (A) 07/06/2019 1116   LABSPEC 1.009 07/06/2019 1116   PHURINE 6.0 07/06/2019 1116   [place] 07/06/2019 1116   [place] 07/06/2019 1116   [place] 07/06/2019 [place] 07/06/2019 1116   PROTEINUR NEGATIVE 07/06/2019 1116   NITRITE NEGATIVE 07/06/2019 1116   LEUKOCYTESUR NEGATIVE 07/06/2019 1116    Radiological Exams on Admission: No results found.  EKG: Independently reviewed.  Sinus rhythm  Assessment/Plan Principal Problem:   Sepsis ([place]) Active Problems:   Essential hypertension   Chronic diarrhea of unknown origin   Hypokalemia   Hyponatremia   AKI (acute kidney injury) ([place])    Sepsis ([place]A) with acute organ dysfunction As evidenced by hypotension, tachycardia, leukocytosis and elevated lactate level Source of  sepsis may be gastrointestinal  Patient presents with worsening diarrhea and has a history of C. difficile toxin She was recently treated with antibiotics for UTI Awaiting results of C. difficile toxin Hold off on antibiotic therapy for now until results become available Aggressive IV fluid resuscitation Patient noted to have worsening of her renal function from baseline, her serum creatinine at baseline is 1 and on admission today it is 2.03 Monitor renal function closely   Acute kidney injury Multifactorial and secondary to ATN from hypotension related to volume depletion from diarrhea as well as concomitant use of diuretic therapy Patient was on hydrochlorothiazide/Lisinopril which will be discontinued Continue aggressive fluid resuscitation Repeat renal parameters in a.m.   Hypokalemia Most likely related to GI losses from diarrhea as well as from diuretic use Supplement potassium   Hyponatremia Most likely hypovolemic Expect improvement in sodium levels with IV fluid resuscitation   Anxiety Continue Xanax Hold amitriptyline until patient's blood pressure stabilizes   Chronic diarrhea Etiology unclear Patient has had extensive work-up in the past Follow-up with GI as an outpatient  DVT prophylaxis: Lovenox  Code Status: Full Family Communication: Greater than 50% of time was spent discussing patient's condition and plan of care with her at the bedside.  All questions and concerns have been addressed. Disposition Plan: Back to previous home environment Consults called: None    Monet North MD [facility]     10/15/2019, 9:20 AM

## 2019-10-15 NOTE — ED Notes (Signed)
Care assumed from jennifer, rn. Pt very upset, states has been in room for over one hour without access to call bell. Pt states she had to unhook her iv to go to the bathroom and hook herself back to ivf. Pt requesting bedside commode and meal tray be removed from room as they are making her "nauseaous". Pt requesting pain medication and nausea medication. Emesis bag provided. Skin pwd, resps unlabored, hrr. Bed in low and locked position. Call bell provided. Meal tray removed approx 5% of tray consumed.

## 2019-10-15 NOTE — ED Triage Notes (Signed)
Pt arrives POV to triage with c/o syncopal episodes. Pt is extremely pale at this time.

## 2019-10-15 NOTE — ED Notes (Signed)
Report to royce, rn.  

## 2019-10-15 NOTE — ED Provider Notes (Signed)
Patient reevaluated, she is feeling much better after fluids, lab work significant for hyponatremia, hypokalemia, acute kidney injury, likely  related to hypovolemia. Will contact the hospitalist for admission   Lavonia Drafts, MD 10/15/19 [phone]

## 2019-10-16 LAB — BASIC METABOLIC PANEL
Anion gap: 9 (ref 5–15)
BUN: 11 mg/dL (ref 6–20)
CO2: 18 mmol/L — ABNORMAL LOW (ref 22–32)
Calcium: 9.3 mg/dL (ref 8.9–10.3)
Chloride: 105 mmol/L (ref 98–111)
Creatinine, Ser: 1.2 mg/dL — ABNORMAL HIGH (ref 0.44–1.00)
GFR calc Af Amer: >60 mL/min (ref >60)
GFR calc non Af Amer: 59 mL/min — ABNORMAL LOW (ref >60)
Glucose, Bld: 125 mg/dL — ABNORMAL HIGH (ref 70–99)
Potassium: 3.7 mmol/L (ref 3.5–5.1)
Sodium: 132 mmol/L — ABNORMAL LOW (ref 135–145)

## 2019-10-16 LAB — CBC
HCT: 38.3 % (ref 36.0–46.0)
Hemoglobin: 12.9 g/dL (ref 12.0–15.0)
MCH: 28.8 pg (ref 26.0–34.0)
MCHC: 33.7 g/dL (ref 30.0–36.0)
MCV: 85.5 fL (ref 80.0–100.0)
Platelets: 453 10*3/uL — ABNORMAL HIGH (ref 150–400)
RBC: 4.48 MIL/uL (ref 3.87–5.11)
RDW: 12.8 % (ref 11.5–15.5)
WBC: 10.3 10*3/uL (ref 4.0–10.5)
nRBC: 0 % (ref 0.0–0.2)

## 2019-10-16 MED ORDER — LOPERAMIDE HCL 2 MG PO CAPS
2.0000 mg | ORAL_CAPSULE | Freq: Two times a day (BID) | ORAL | Status: DC
  Administered 2019-10-16 – 2019-10-17 (×3): 2 mg via ORAL
  Filled 2019-10-16 (×3): qty 1

## 2019-10-16 MED ORDER — PANCRELIPASE (LIP-PROT-AMYL) 12000-38000 UNITS PO CPEP
24000.0000 [IU] | ORAL_CAPSULE | Freq: Three times a day (TID) | ORAL | Status: DC
  Administered 2019-10-16 – 2019-10-26 (×30): 24000 [IU] via ORAL
  Filled 2019-10-16 (×32): qty 2

## 2019-10-16 MED ORDER — HYDROCODONE-ACETAMINOPHEN 5-325 MG PO TABS
1.0000 | ORAL_TABLET | ORAL | Status: DC | PRN
  Administered 2019-10-17 – 2019-10-26 (×19): 1 via ORAL
  Filled 2019-10-16 (×20): qty 1

## 2019-10-16 MED ORDER — OCTREOTIDE ACETATE 100 MCG/ML IJ SOLN
100.0000 ug | Freq: Three times a day (TID) | INTRAMUSCULAR | Status: DC
  Administered 2019-10-16 – 2019-10-28 (×36): 100 ug via SUBCUTANEOUS
  Filled 2019-10-16 (×39): qty 1

## 2019-10-16 MED ORDER — PROMETHAZINE HCL 25 MG/ML IJ SOLN
25.0000 mg | Freq: Four times a day (QID) | INTRAMUSCULAR | Status: DC | PRN
  Administered 2019-10-20 – 2019-10-27 (×15): 25 mg via INTRAVENOUS
  Filled 2019-10-16 (×15): qty 1

## 2019-10-16 NOTE — Progress Notes (Signed)
Patient has had at least 2 falls at home within 6 months , patient educated on our falls protocol and the need for the bed alarm. Patient acknowledged understanding of protocol but still refused the bed alarm. Molly Cisneros

## 2019-10-16 NOTE — Progress Notes (Signed)
PROGRESS NOTE    Molly Cisneros  [id number] DOB: 03-Oct-1985 DOA: 10/15/2019 PCP: Glean Hess, MD    Assessment & Plan:   Principal Problem:   Sepsis [facility]) Active Problems:   Essential hypertension   Chronic diarrhea of unknown origin   Hypokalemia   Hyponatremia   AKI (acute kidney injury) ([place])    Molly Cisneros is a 34 y.o. female with medical history significant for hypertension, dyslipidemia and anxiety who presents to the emergency room after she had 2 syncopal episodes at home.  Patient states that she has been having loose watery stools since April, 2020 and usually has bouts where it gets worse with increased frequency resulting in dehydration requiring hospitalization.  Per patient frequency of diarrhea increased over the last 3 days and has been associated with nausea and vomiting as well as abdominal pain mostly in the periumbilical area.    Sepsis, ruled out -- hypotension, tachycardia, leukocytosis due to dehydration and hemoconcentration, all improved after IVF. --C diff and GI path both neg  Chronic diarrhea and abdominal pain Etiology unclear Patient has had extensive work-up in the past with [facility].  Ruled out inflammatory bowel diseases, Celiac. --C diff and GI path both neg on presentation PLAN: --continue MIVF --schedule Imodium BID --resume home Bentyl and octreotide  --Trial Creon --Norco PRN for pain --Follow-up with GI as an outpatient  Acute kidney injury, POA, improved Multifactorial and secondary to ATN from hypotension related to volume depletion from diarrhea as well as concomitant use of diuretic therapy PLAN: --continue IVF hydration --Hold home hydrochlorothiazide/Lisinopril   Hypokalemia, resolved Most likely related to GI losses from diarrhea as well as from diuretic use Supplement potassium  Hyponatremia, improved Most likely hypovolemic Expect improvement in sodium levels with IV fluid resuscitation  Anxiety Continue  home Xanax Hold amitriptyline until patient's blood pressure stabilizes    DVT prophylaxis: Lovenox SQ Code Status: Full code  Family Communication:  Status is: inpatient Dispo:   The patient is from: home Anticipated d/c is to: home Anticipated d/c date is: 1-2 days Patient currently is not medically stable to d/c due to: severe diarrhea causing AKI and electrolyte abnormalities, requiring IVF.   Subjective and Interval History:  Pt reported continued abdominal pain and frequent watery diarrhea, which is chronic but more severe recently.  No fever, dyspnea, chest pain, vomiting, dysuria.    Objective: Vitals:   10/16/19 0114 10/16/19 0618 10/16/19 0809 10/16/19 1352  BP: 111/79 106/74 100/74 102/60  Pulse: 99 (!) 106 (!) 101 91  Resp: 20 20 16 16   Temp: 98.7 F (37.1 C) 98.5 F (36.9 C) (!) 97.3 F (36.3 C) 98.4 F (36.9 C)  TempSrc: Oral Oral Axillary Oral  SpO2: 97% 98% 96% 97%  Weight:      Height:        Intake/Output Summary (Last 24 hours) at 10/16/2019 1816 Last data filed at 10/16/2019 1359 Gross per 24 hour  Intake 3294.15 ml  Output 0 ml  Net 3294.15 ml   Filed Weights   10/15/19 0638 10/15/19 2104  Weight: 97.7 kg 101 kg    Examination:   Constitutional: NAD, AAOx3 HEENT: conjunctivae and lids normal, EOMI CV: RRR no M,R,G. Distal pulses +2.  No cyanosis.   RESP: CTA B/L, normal respiratory effort  GI: +BS, ND, soft, diffusely tender Extremities: No effusions, edema, or tenderness in BLE SKIN: warm, dry and intact Neuro: II - XII grossly intact.  Sensation intact Psych: Depressed mood and affect.  Appropriate judgement and reason   Data Reviewed: I have personally reviewed following labs and imaging studies  CBC: Recent Labs  Lab 10/15/19 0645 10/16/19 0609  WBC 16.0* 10.3  HGB 13.5 12.9  HCT 39.9 38.3  MCV 85.1 85.5  PLT 495* 0000000*   Basic Metabolic Panel: Recent Labs  Lab 10/14/19 1418 10/15/19 0645 10/16/19 0609  NA 133*  130* 132*  K 3.4* 3.4* 3.7  CL 100 99 105  CO2 22 19* 18*  GLUCOSE 97 122* 125*  BUN 10 18 11   CREATININE 1.09* 2.03* 1.20*  CALCIUM 9.3 9.5 9.3   GFR: Estimated Creatinine Clearance: 87.1 mL/min (A) (by C-G formula based on SCr of 1.2 mg/dL (H)). Liver Function Tests: Recent Labs  Lab 10/15/19 0645  AST 34  ALT 40  ALKPHOS 70  BILITOT 0.6  PROT 7.6  ALBUMIN 4.3   Recent Labs  Lab 10/15/19 0645  LIPASE 41   No results for input(s): AMMONIA in the last 168 hours. Coagulation Profile: No results for input(s): INR, PROTIME in the last 168 hours. Cardiac Enzymes: No results for input(s): CKTOTAL, CKMB, CKMBINDEX, TROPONINI in the last 168 hours. BNP (last 3 results) No results for input(s): PROBNP in the last 8760 hours. HbA1C: No results for input(s): HGBA1C in the last 72 hours. CBG: No results for input(s): GLUCAP in the last 168 hours. Lipid Profile: No results for input(s): CHOL, HDL, LDLCALC, TRIG, CHOLHDL, LDLDIRECT in the last 72 hours. Thyroid Function Tests: No results for input(s): TSH, T4TOTAL, FREET4, T3FREE, THYROIDAB in the last 72 hours. Anemia Panel: No results for input(s): VITAMINB12, FOLATE, FERRITIN, TIBC, IRON, RETICCTPCT in the last 72 hours. Sepsis Labs: Recent Labs  Lab 10/15/19 0645 10/15/19 0915  LATICACIDVEN 2.8* 2.1*    Recent Results (from the past 240 hour(s))  SARS Coronavirus 2 by RT PCR (hospital order, performed in [facility] hospital lab) Nasopharyngeal Nasopharyngeal Swab     Status: None   Collection Time: 10/15/19  7:24 AM   Specimen: Nasopharyngeal Swab  Result Value Ref Range Status   SARS Coronavirus 2 NEGATIVE NEGATIVE Final    Comment: (NOTE) SARS-CoV-2 target nucleic acids are NOT DETECTED. The SARS-CoV-2 RNA is generally detectable in upper and lower respiratory specimens during the acute phase of infection. The lowest concentration of SARS-CoV-2 viral copies this assay can detect is 250 copies / mL. A negative  result does not preclude SARS-CoV-2 infection and should not be used as the sole basis for treatment or other patient management decisions.  A negative result may occur with improper specimen collection / handling, submission of specimen other than nasopharyngeal swab, presence of viral mutation(s) within the areas targeted by this assay, and inadequate number of viral copies (<250 copies / mL). A negative result must be combined with clinical observations, patient history, and epidemiological information. Fact Sheet for Patients:   StrictlyIdeas.no Fact Sheet for Healthcare Providers: BankingDealers.co.za This test is not yet approved or cleared  by the [place] FDA and has been authorized for detection and/or diagnosis of SARS-CoV-2 by FDA under an Emergency Use Authorization (EUA).  This EUA will remain in effect (meaning this test can be used) for the duration of the COVID-19 declaration under Section 564(b)(1) of the Act, 21 U.S.C. section 360bbb-3(b)(1), unless the authorization is terminated or revoked sooner. Performed at [facility], [address] [place], [place] [address]   C Difficile Quick Screen w PCR reflex     Status: None   Collection  Time: 10/15/19  9:15 AM   Specimen: Urine, Clean Catch; Stool  Result Value Ref Range Status   C Diff antigen NEGATIVE NEGATIVE Final   C Diff toxin NEGATIVE NEGATIVE Final   C Diff interpretation No C. difficile detected.  Final    Comment: Performed at [facility], [address] [place], [place] [address]  Gastrointestinal Panel by PCR , Stool     Status: None   Collection Time: 10/15/19  9:15 AM   Specimen: Urine, Clean Catch; Stool  Result Value Ref Range Status   Campylobacter species NOT DETECTED NOT DETECTED Final   Plesimonas shigelloides NOT DETECTED NOT DETECTED Final   Salmonella species NOT DETECTED NOT DETECTED Final   Yersinia enterocolitica  NOT DETECTED NOT DETECTED Final   Vibrio species NOT DETECTED NOT DETECTED Final   Vibrio cholerae NOT DETECTED NOT DETECTED Final   Enteroaggregative E coli (EAEC) NOT DETECTED NOT DETECTED Final   Enteropathogenic E coli (EPEC) NOT DETECTED NOT DETECTED Final   Enterotoxigenic E coli (ETEC) NOT DETECTED NOT DETECTED Final   Shiga like toxin producing E coli (STEC) NOT DETECTED NOT DETECTED Final   Shigella/Enteroinvasive E coli (EIEC) NOT DETECTED NOT DETECTED Final   Cryptosporidium NOT DETECTED NOT DETECTED Final   Cyclospora cayetanensis NOT DETECTED NOT DETECTED Final   Entamoeba histolytica NOT DETECTED NOT DETECTED Final   Giardia lamblia NOT DETECTED NOT DETECTED Final   Adenovirus F40/41 NOT DETECTED NOT DETECTED Final   Astrovirus NOT DETECTED NOT DETECTED Final   Norovirus GI/GII NOT DETECTED NOT DETECTED Final   Rotavirus A NOT DETECTED NOT DETECTED Final   Sapovirus (I, II, IV, and V) NOT DETECTED NOT DETECTED Final    Comment: Performed at [facility], [address] [place], [place] [address]      Radiology Studies: CT ABDOMEN PELVIS WO CONTRAST  Result Date: 10/15/2019 CLINICAL DATA:  Hypertension.  Tachycardia.  Leukocytosis. EXAM: CT ABDOMEN AND PELVIS WITHOUT CONTRAST TECHNIQUE: Multidetector CT imaging of the abdomen and pelvis was performed following the standard protocol without IV contrast. COMPARISON:  February 12, 2019 FINDINGS: Lower chest: No acute abnormality. Hepatobiliary: No focal liver abnormality is seen. No gallstones, gallbladder wall thickening, or biliary dilatation. Pancreas: Unremarkable. No pancreatic ductal dilatation or surrounding inflammatory changes. Spleen: Normal in size without focal abnormality. Adrenals/Urinary Tract: Adrenal glands are unremarkable. Kidneys are normal, without renal calculi, focal lesion, or hydronephrosis. Bladder is unremarkable. Stomach/Bowel: The stomach is mildly distended with contrast. The stomach is  otherwise normal in appearance. The small bowel is normal. No obstruction. There is fluid within the descending colon, sigmoid colon, and the rectum, likely due to the patient's history of diarrhea. Recommend clinical correlation. No colonic wall thickening or pericolonic stranding. The patient is status post appendectomy. Vascular/Lymphatic: No significant vascular findings are present. No enlarged abdominal or pelvic lymph nodes. Reproductive: Uterus and bilateral adnexa are unremarkable. Other: No abdominal wall hernia or abnormality. No abdominopelvic ascites. Musculoskeletal: No acute or significant osseous findings. IMPRESSION: 1. No cause for the patient's symptoms identified. 2. Fluid in the distal colon and rectum is likely due to the patient's reported history of chronic diarrhea. Recommend clinical correlation. No colonic wall thickening or pericolonic stranding. 3. No other abnormalities. Electronically Signed   By: Dorise Bullion III M.D   On: 10/15/2019 13:26     Scheduled Meds: . amphetamine-dextroamphetamine  30 mg Oral Daily  . atorvastatin  20 mg Oral Daily  . B-complex with vitamin C  1 tablet Oral Daily  . cyclobenzaprine  5 mg Oral TID  . dicyclomine  20 mg Oral TID AC  . DULoxetine  60 mg Oral Daily  . enoxaparin (LOVENOX) injection  40 mg Subcutaneous Q24H  . folic acid  1 mg Oral Daily  . lipase/protease/amylase  24,000 Units Oral TID AC  . loperamide  2 mg Oral BID  . octreotide  100 mcg Subcutaneous Q8H  . pantoprazole  40 mg Oral BID AC  . sodium chloride flush  3 mL Intravenous Q12H  . cyanocobalamin  1,000 mcg Oral Daily   Continuous Infusions: . 0.9 % NaCl with KCl 40 mEq / L 125 mL/hr at 10/16/19 1359     LOS: 1 day     Enzo Bi, MD [facility] If 7PM-7AM, please contact night-coverage 10/16/2019, 6:16 PM

## 2019-10-17 LAB — CBC
HCT: 33.6 % — ABNORMAL LOW (ref 36.0–46.0)
Hemoglobin: 11.5 g/dL — ABNORMAL LOW (ref 12.0–15.0)
MCH: 29.3 pg (ref 26.0–34.0)
MCHC: 34.2 g/dL (ref 30.0–36.0)
MCV: 85.5 fL (ref 80.0–100.0)
Platelets: 385 10*3/uL (ref 150–400)
RBC: 3.93 MIL/uL (ref 3.87–5.11)
RDW: 13 % (ref 11.5–15.5)
WBC: 10.6 10*3/uL — ABNORMAL HIGH (ref 4.0–10.5)
nRBC: 0 % (ref 0.0–0.2)

## 2019-10-17 LAB — MAGNESIUM: Magnesium: 1.6 mg/dL — ABNORMAL LOW (ref 1.7–2.4)

## 2019-10-17 LAB — BASIC METABOLIC PANEL
Anion gap: 5 (ref 5–15)
BUN: 8 mg/dL (ref 6–20)
CO2: 20 mmol/L — ABNORMAL LOW (ref 22–32)
Calcium: 8.7 mg/dL — ABNORMAL LOW (ref 8.9–10.3)
Chloride: 113 mmol/L — ABNORMAL HIGH (ref 98–111)
Creatinine, Ser: 1.27 mg/dL — ABNORMAL HIGH (ref 0.44–1.00)
GFR calc Af Amer: >60 mL/min (ref >60)
GFR calc non Af Amer: 55 mL/min — ABNORMAL LOW (ref >60)
Glucose, Bld: 103 mg/dL — ABNORMAL HIGH (ref 70–99)
Potassium: 4.7 mmol/L (ref 3.5–5.1)
Sodium: 138 mmol/L (ref 135–145)

## 2019-10-17 LAB — LACTIC ACID, PLASMA: Lactic Acid, Venous: 2 mmol/L (ref 0.5–1.9)

## 2019-10-17 MED ORDER — SODIUM CHLORIDE 0.9% FLUSH
10.0000 mL | INTRAVENOUS | Status: DC | PRN
  Administered 2019-10-24 – 2019-10-27 (×2): 10 mL

## 2019-10-17 MED ORDER — MAGNESIUM SULFATE 2 GM/50ML IV SOLN
2.0000 g | Freq: Once | INTRAVENOUS | Status: AC
  Administered 2019-10-17: 2 g via INTRAVENOUS
  Filled 2019-10-17: qty 50

## 2019-10-17 MED ORDER — DIPHENOXYLATE-ATROPINE 2.5-0.025 MG PO TABS
2.0000 | ORAL_TABLET | Freq: Two times a day (BID) | ORAL | Status: DC
  Administered 2019-10-17 – 2019-10-25 (×17): 2 via ORAL
  Filled 2019-10-17 (×17): qty 2

## 2019-10-17 NOTE — Progress Notes (Signed)
PROGRESS NOTE    Molly Cisneros  [id number] DOB: [birth year] DOA: 10/15/2019 PCP: Glean Hess, MD    Assessment & Plan:   Principal Problem:   Sepsis [facility]) Active Problems:   Essential hypertension   Chronic diarrhea of unknown origin   Hypokalemia   Hyponatremia   AKI (acute kidney injury) ([place])    Molly Cisneros is a 34 y.o. Caucasian female with medical history significant for hypertension, dyslipidemia and anxiety who presents to the emergency room after she had 2 syncopal episodes at home.  Patient states that she has been having loose watery stools since April, 2020 and usually has bouts where it gets worse with increased frequency resulting in dehydration requiring hospitalization.  Per patient frequency of diarrhea increased over the last 3 days and has been associated with nausea and vomiting as well as abdominal pain mostly in the periumbilical area.    Sepsis, ruled out -- hypotension, tachycardia, leukocytosis due to dehydration and hemoconcentration, all improved after IVF. --C diff and GI path both neg  Chronic diarrhea and abdominal pain Etiology unclear Patient has had extensive work-up in the past with [facility].  Ruled out inflammatory bowel diseases, Celiac. --C diff and GI path both neg on presentation PLAN: --continue MIVF --schedule Lomotil BID --home Bentyl and octreotide as scheduled --Trial Creon scheduled TID --Norco PRN for pain --Follow-up with GI as an outpatient  Acute kidney injury, POA, improved Multifactorial and secondary to ATN from hypotension related to volume depletion from diarrhea as well as concomitant use of diuretic therapy PLAN: --continue IVF hydration --Hold home hydrochlorothiazide/Lisinopril   Hypokalemia, resolved [place] Most likely related to GI losses from diarrhea as well as from diuretic use Supplement potassium and IV mag PRN  Hyponatremia, resolved Most likely hypovolemic Expect improvement in sodium levels  with IV fluid resuscitation  Anxiety Continue home Xanax Hold amitriptyline until patient's blood pressure stabilizes    DVT prophylaxis: Lovenox SQ Code Status: Full code  Family Communication:  Status is: inpatient Dispo:   The patient is from: home Anticipated d/c is to: home Anticipated d/c date is: Wednesday Patient currently is not medically stable to d/c due to: severe diarrhea causing AKI and electrolyte abnormalities, requiring IVF.  Will discontinue IVF tomorrow and stop electrolytes repletion, and if pt can maintain hydration and electrolytes on her own on Wednesday, then can discharge Wed.   Subjective and Interval History:  Overall, pt's symptoms have improved.  Still having diarrhea, but reduced in frequency.  No fever, dyspnea, chest pain, dysuria.     Objective: Vitals:   10/16/19 1352 10/16/19 2008 10/17/19 0447 10/17/19 1148  BP: 102/60 106/70 116/76 115/78  Pulse: 91 88 96 (!) 102  Resp: 16 20 16    Temp: 98.4 F (36.9 C) 98 F (36.7 C) 97.7 F (36.5 C) 98 F (36.7 C)  TempSrc: Oral Oral Oral Oral  SpO2: 97% 98% 100% 97%  Weight:      Height:        Intake/Output Summary (Last 24 hours) at 10/17/2019 1450 Last data filed at 10/17/2019 1345 Gross per 24 hour  Intake 1824.82 ml  Output --  Net 1824.82 ml   Filed Weights   10/15/19 0638 10/15/19 2104  Weight: 97.7 kg 101 kg    Examination:   Constitutional: NAD, AAOx3 HEENT: conjunctivae and lids normal, EOMI CV: RRR no M,R,G. Distal pulses +2.  No cyanosis.   RESP: CTA B/L, normal respiratory effort  GI: +BS, ND, soft, diffusely tender  Extremities: No effusions, edema, or tenderness in BLE SKIN: warm, dry and intact Neuro: II - XII grossly intact.  Sensation intact Psych: Depressed mood and affect.  Appropriate judgement and reason   Data Reviewed: I have personally reviewed following labs and imaging studies  CBC: Recent Labs  Lab 10/15/19 0645 10/16/19 0609 10/17/19 0459  WBC  16.0* 10.3 10.6*  HGB 13.5 12.9 11.5*  HCT 39.9 38.3 33.6*  MCV 85.1 85.5 85.5  PLT 495* 453* 0000000   Basic Metabolic Panel: Recent Labs  Lab 10/14/19 1418 10/15/19 0645 10/16/19 0609 10/17/19 0459  NA 133* 130* 132* 138  K 3.4* 3.4* 3.7 4.7  CL 100 99 105 113*  CO2 22 19* 18* 20*  GLUCOSE 97 122* 125* 103*  BUN 10 18 11 8   CREATININE 1.09* 2.03* 1.20* 1.27*  CALCIUM 9.3 9.5 9.3 8.7*  MG  --   --   --  1.6*   GFR: Estimated Creatinine Clearance: 82.3 mL/min (A) (by C-G formula based on SCr of 1.27 mg/dL (H)). Liver Function Tests: Recent Labs  Lab 10/15/19 0645  AST 34  ALT 40  ALKPHOS 70  BILITOT 0.6  PROT 7.6  ALBUMIN 4.3   Recent Labs  Lab 10/15/19 0645  LIPASE 41   No results for input(s): AMMONIA in the last 168 hours. Coagulation Profile: No results for input(s): INR, PROTIME in the last 168 hours. Cardiac Enzymes: No results for input(s): CKTOTAL, CKMB, CKMBINDEX, TROPONINI in the last 168 hours. BNP (last 3 results) No results for input(s): PROBNP in the last 8760 hours. HbA1C: No results for input(s): HGBA1C in the last 72 hours. CBG: No results for input(s): GLUCAP in the last 168 hours. Lipid Profile: No results for input(s): CHOL, HDL, LDLCALC, TRIG, CHOLHDL, LDLDIRECT in the last 72 hours. Thyroid Function Tests: No results for input(s): TSH, T4TOTAL, FREET4, T3FREE, THYROIDAB in the last 72 hours. Anemia Panel: No results for input(s): VITAMINB12, FOLATE, FERRITIN, TIBC, IRON, RETICCTPCT in the last 72 hours. Sepsis Labs: Recent Labs  Lab 10/15/19 0645 10/15/19 0915 10/17/19 0943  LATICACIDVEN 2.8* 2.1* 2.0*    Recent Results (from the past 240 hour(s))  SARS Coronavirus 2 by RT PCR (hospital order, performed in [facility] hospital lab) Nasopharyngeal Nasopharyngeal Swab     Status: None   Collection Time: 10/15/19  7:24 AM   Specimen: Nasopharyngeal Swab  Result Value Ref Range Status   SARS Coronavirus 2 NEGATIVE NEGATIVE Final     Comment: (NOTE) SARS-CoV-2 target nucleic acids are NOT DETECTED. The SARS-CoV-2 RNA is generally detectable in upper and lower respiratory specimens during the acute phase of infection. The lowest concentration of SARS-CoV-2 viral copies this assay can detect is 250 copies / mL. A negative result does not preclude SARS-CoV-2 infection and should not be used as the sole basis for treatment or other patient management decisions.  A negative result may occur with improper specimen collection / handling, submission of specimen other than nasopharyngeal swab, presence of viral mutation(s) within the areas targeted by this assay, and inadequate number of viral copies (<250 copies / mL). A negative result must be combined with clinical observations, patient history, and epidemiological information. Fact Sheet for Patients:   StrictlyIdeas.no Fact Sheet for Healthcare Providers: BankingDealers.co.za This test is not yet approved or cleared  by the [place] FDA and has been authorized for detection and/or diagnosis of SARS-CoV-2 by FDA under an Emergency Use Authorization (EUA).  This EUA will remain in effect (meaning  this test can be used) for the duration of the COVID-19 declaration under Section 564(b)(1) of the Act, 21 U.S.C. section 360bbb-3(b)(1), unless the authorization is terminated or revoked sooner. Performed at [facility], [place], [place] [address]   C Difficile Quick Screen w PCR reflex     Status: None   Collection Time: 10/15/19  9:15 AM   Specimen: Urine, Clean Catch; Stool  Result Value Ref Range Status   C Diff antigen NEGATIVE NEGATIVE Final   C Diff toxin NEGATIVE NEGATIVE Final   C Diff interpretation No C. difficile detected.  Final    Comment: Performed at [facility], [address] [place], [place] [address]  Gastrointestinal Panel by PCR , Stool     Status: None    Collection Time: 10/15/19  9:15 AM   Specimen: Urine, Clean Catch; Stool  Result Value Ref Range Status   Campylobacter species NOT DETECTED NOT DETECTED Final   Plesimonas shigelloides NOT DETECTED NOT DETECTED Final   Salmonella species NOT DETECTED NOT DETECTED Final   Yersinia enterocolitica NOT DETECTED NOT DETECTED Final   Vibrio species NOT DETECTED NOT DETECTED Final   Vibrio cholerae NOT DETECTED NOT DETECTED Final   Enteroaggregative E coli (EAEC) NOT DETECTED NOT DETECTED Final   Enteropathogenic E coli (EPEC) NOT DETECTED NOT DETECTED Final   Enterotoxigenic E coli (ETEC) NOT DETECTED NOT DETECTED Final   Shiga like toxin producing E coli (STEC) NOT DETECTED NOT DETECTED Final   Shigella/Enteroinvasive E coli (EIEC) NOT DETECTED NOT DETECTED Final   Cryptosporidium NOT DETECTED NOT DETECTED Final   Cyclospora cayetanensis NOT DETECTED NOT DETECTED Final   Entamoeba histolytica NOT DETECTED NOT DETECTED Final   Giardia lamblia NOT DETECTED NOT DETECTED Final   Adenovirus F40/41 NOT DETECTED NOT DETECTED Final   Astrovirus NOT DETECTED NOT DETECTED Final   Norovirus GI/GII NOT DETECTED NOT DETECTED Final   Rotavirus A NOT DETECTED NOT DETECTED Final   Sapovirus (I, II, IV, and V) NOT DETECTED NOT DETECTED Final    Comment: Performed at [facility], [address] [place], [place] [address]      Radiology Studies: No results found.   Scheduled Meds: . amphetamine-dextroamphetamine  30 mg Oral Daily  . atorvastatin  20 mg Oral Daily  . B-complex with vitamin C  1 tablet Oral Daily  . cyclobenzaprine  5 mg Oral TID  . dicyclomine  20 mg Oral TID AC  . diphenoxylate-atropine  2 tablet Oral BID  . DULoxetine  60 mg Oral Daily  . enoxaparin (LOVENOX) injection  40 mg Subcutaneous Q24H  . folic acid  1 mg Oral Daily  . lipase/protease/amylase  24,000 Units Oral TID AC  . octreotide  100 mcg Subcutaneous Q8H  . pantoprazole  40 mg Oral BID AC  . sodium  chloride flush  3 mL Intravenous Q12H  . cyanocobalamin  1,000 mcg Oral Daily   Continuous Infusions: . 0.9 % NaCl with KCl 40 mEq / L 100 mL/hr at 10/17/19 0451     LOS: 2 days     Enzo Bi, MD [facility] If 7PM-7AM, please contact night-coverage 10/17/2019, 2:50 PM

## 2019-10-18 DIAGNOSIS — E872 Acidosis, unspecified: Secondary | ICD-10-CM | POA: Diagnosis present

## 2019-10-18 DIAGNOSIS — R109 Unspecified abdominal pain: Secondary | ICD-10-CM

## 2019-10-18 HISTORY — DX: Acidosis, unspecified: E87.20

## 2019-10-18 HISTORY — DX: Unspecified abdominal pain: R10.9

## 2019-10-18 HISTORY — DX: Acidosis: E87.2

## 2019-10-18 LAB — LACTIC ACID, PLASMA: Lactic Acid, Venous: 1.4 mmol/L (ref 0.5–1.9)

## 2019-10-18 LAB — BASIC METABOLIC PANEL
Anion gap: 5 (ref 5–15)
BUN: 8 mg/dL (ref 6–20)
CO2: 21 mmol/L — ABNORMAL LOW (ref 22–32)
Calcium: 8.2 mg/dL — ABNORMAL LOW (ref 8.9–10.3)
Chloride: 111 mmol/L (ref 98–111)
Creatinine, Ser: 1.36 mg/dL — ABNORMAL HIGH (ref 0.44–1.00)
GFR calc Af Amer: 58 mL/min — ABNORMAL LOW (ref >60)
GFR calc non Af Amer: 50 mL/min — ABNORMAL LOW (ref >60)
Glucose, Bld: 93 mg/dL (ref 70–99)
Potassium: 4.4 mmol/L (ref 3.5–5.1)
Sodium: 137 mmol/L (ref 135–145)

## 2019-10-18 LAB — CBC
HCT: 30.1 % — ABNORMAL LOW (ref 36.0–46.0)
Hemoglobin: 10.1 g/dL — ABNORMAL LOW (ref 12.0–15.0)
MCH: 28.6 pg (ref 26.0–34.0)
MCHC: 33.6 g/dL (ref 30.0–36.0)
MCV: 85.3 fL (ref 80.0–100.0)
Platelets: 345 10*3/uL (ref 150–400)
RBC: 3.53 MIL/uL — ABNORMAL LOW (ref 3.87–5.11)
RDW: 12.9 % (ref 11.5–15.5)
WBC: 9.1 10*3/uL (ref 4.0–10.5)
nRBC: 0 % (ref 0.0–0.2)

## 2019-10-18 LAB — MAGNESIUM: Magnesium: 1.6 mg/dL — ABNORMAL LOW (ref 1.7–2.4)

## 2019-10-18 MED ORDER — MAGNESIUM SULFATE 2 GM/50ML IV SOLN
2.0000 g | Freq: Once | INTRAVENOUS | Status: AC
  Administered 2019-10-18: 2 g via INTRAVENOUS
  Filled 2019-10-18: qty 50

## 2019-10-18 MED ORDER — VALACYCLOVIR HCL 500 MG PO TABS
1000.0000 mg | ORAL_TABLET | Freq: Two times a day (BID) | ORAL | Status: DC
  Administered 2019-10-18 – 2019-10-22 (×9): 1000 mg via ORAL
  Filled 2019-10-18 (×12): qty 2

## 2019-10-18 NOTE — Progress Notes (Addendum)
PROGRESS NOTE    Molly Cisneros  [id number] DOB: Feb 09, 1986 DOA: 10/15/2019 PCP: Glean Hess, MD    Assessment & Plan:   Principal Problem:   Sepsis [facility]) Active Problems:   Essential hypertension   Chronic diarrhea of unknown origin   Hypokalemia   Hyponatremia   AKI (acute kidney injury) ([place])    Molly Cisneros is a 34 y.o. Caucasian female with medical history significant for hypertension, dyslipidemia and anxiety who presents to the emergency room after she had 2 syncopal episodes at home.  Patient states that she has been having loose watery stools since April, 2020 and usually has bouts where it gets worse with increased frequency resulting in dehydration requiring hospitalization.  Per patient frequency of diarrhea increased over the last 3 days and has been associated with nausea and vomiting as well as abdominal pain mostly in the periumbilical area.    Sepsis, ruled out -- hypotension, tachycardia, leukocytosis due to dehydration and hemoconcentration, all improved after IVF. --C diff and GI path both neg  Acute on Chronic diarrhea and abdominal pain Etiology unclear Patient has had extensive work-up in the past with [facility].  Ruled out inflammatory bowel diseases, Celiac. --C diff and GI path both neg on presentation PLAN: --continue MIVF@125  --Lomotil BID --home Bentyl and octreotide as scheduled --Trial Creon scheduled TID --Norco PRN for pain --Follow-up with GI as an outpatient  Acute kidney injury, POA, improved --Cr 2.03 on presentation.  Baseline ~1.2.  Multifactorial and secondary to ATN from hypotension related to volume depletion from diarrhea as well as concomitant use of diuretic therapy. --Cr trended up slightly from 1.27 to 1.36. PLAN: --continue MIVF@125  --Hold home hydrochlorothiazide/Lisinopril   Hypokalemia, resolved Hypomag Most likely related to GI losses from diarrhea as well as from diuretic use Supplement PO potassium and IV mag  PRN  Hyponatremia, resolved Most likely hypovolemic Expect improvement in sodium levels with IV fluid resuscitation  Lactic acidosis, POA, improved --continue IVF --trend lactic acid until normal  Anxiety Continue home Xanax   DVT prophylaxis: Lovenox SQ Code Status: Full code  Family Communication:  Status is: inpatient Dispo:   The patient is from: home Anticipated d/c is to: home Anticipated d/c date is: 1-2 days Patient currently is not medically stable to d/c due to: severe diarrhea causing AKI and electrolyte abnormalities, requiring IVF.  Cr trended up some today.  Pt will need to demonstrate Cr and electrolytes stability without IVF and repletion before discharge.    Subjective and Interval History:  Still having profuse diarrhea multiple times day and night.  Abdomen tender.  Not felt well.  Poor appetite.  Having good urine output though.  No fever, dyspnea, dysuria.   Objective: Vitals:   10/17/19 1148 10/17/19 2215 10/18/19 0556 10/18/19 1157  BP: 115/78 (!) 113/92 105/80 113/77  Pulse: (!) 102 78 88 88  Resp:  18 16   Temp: 98 F (36.7 C) 98.2 F (36.8 C) 97.6 F (36.4 C) 97.7 F (36.5 C)  TempSrc: Oral Oral Oral Oral  SpO2: 97% 100% 98% 99%  Weight:      Height:        Intake/Output Summary (Last 24 hours) at 10/18/2019 1529 Last data filed at 10/18/2019 1300 Gross per 24 hour  Intake 2146.41 ml  Output --  Net 2146.41 ml   Filed Weights   10/15/19 0638 10/15/19 2104  Weight: 97.7 kg 101 kg    Examination:   Constitutional: NAD, AAOx3 HEENT: conjunctivae and  lids normal, EOMI CV: RRR no M,R,G. Distal pulses +2.  No cyanosis.   RESP: CTA B/L, normal respiratory effort  GI: +BS, ND, soft, diffusely tender Extremities: No effusions, edema, or tenderness in BLE SKIN: warm, dry and intact Neuro: II - XII grossly intact.  Sensation intact Psych: normal mood and affect.  Appropriate judgement and reason   Data Reviewed: I have personally  reviewed following labs and imaging studies  CBC: Recent Labs  Lab 10/15/19 0645 10/16/19 0609 10/17/19 0459 10/18/19 0654  WBC 16.0* 10.3 10.6* 9.1  HGB 13.5 12.9 11.5* 10.1*  HCT 39.9 38.3 33.6* 30.1*  MCV 85.1 85.5 85.5 85.3  PLT 495* 453* 385 [id number]   Basic Metabolic Panel: Recent Labs  Lab 10/14/19 1418 10/15/19 0645 10/16/19 0609 10/17/19 0459 10/18/19 0654  NA 133* 130* 132* 138 137  K 3.4* 3.4* 3.7 4.7 4.4  CL 100 99 105 113* 111  CO2 22 19* 18* 20* 21*  GLUCOSE 97 122* 125* 103* 93  BUN 10 18 11 8 8   CREATININE 1.09* 2.03* 1.20* 1.27* 1.36*  CALCIUM 9.3 9.5 9.3 8.7* 8.2*  MG  --   --   --  1.6* 1.6*   GFR: Estimated Creatinine Clearance: 76.8 mL/min (A) (by C-G formula based on SCr of 1.36 mg/dL (H)). Liver Function Tests: Recent Labs  Lab 10/15/19 0645  AST 34  ALT 40  ALKPHOS 70  BILITOT 0.6  PROT 7.6  ALBUMIN 4.3   Recent Labs  Lab 10/15/19 0645  LIPASE 41   No results for input(s): AMMONIA in the last 168 hours. Coagulation Profile: No results for input(s): INR, PROTIME in the last 168 hours. Cardiac Enzymes: No results for input(s): CKTOTAL, CKMB, CKMBINDEX, TROPONINI in the last 168 hours. BNP (last 3 results) No results for input(s): PROBNP in the last 8760 hours. HbA1C: No results for input(s): HGBA1C in the last 72 hours. CBG: No results for input(s): GLUCAP in the last 168 hours. Lipid Profile: No results for input(s): CHOL, HDL, LDLCALC, TRIG, CHOLHDL, LDLDIRECT in the last 72 hours. Thyroid Function Tests: No results for input(s): TSH, T4TOTAL, FREET4, T3FREE, THYROIDAB in the last 72 hours. Anemia Panel: No results for input(s): VITAMINB12, FOLATE, FERRITIN, TIBC, IRON, RETICCTPCT in the last 72 hours. Sepsis Labs: Recent Labs  Lab 10/15/19 0645 10/15/19 0915 10/17/19 0943  LATICACIDVEN 2.8* 2.1* 2.0*    Recent Results (from the past 240 hour(s))  SARS Coronavirus 2 by RT PCR (hospital order, performed in [facility]  hospital lab) Nasopharyngeal Nasopharyngeal Swab     Status: None   Collection Time: 10/15/19  7:24 AM   Specimen: Nasopharyngeal Swab  Result Value Ref Range Status   SARS Coronavirus 2 NEGATIVE NEGATIVE Final    Comment: (NOTE) SARS-CoV-2 target nucleic acids are NOT DETECTED. The SARS-CoV-2 RNA is generally detectable in upper and lower respiratory specimens during the acute phase of infection. The lowest concentration of SARS-CoV-2 viral copies this assay can detect is 250 copies / mL. A negative result does not preclude SARS-CoV-2 infection and should not be used as the sole basis for treatment or other patient management decisions.  A negative result may occur with improper specimen collection / handling, submission of specimen other than nasopharyngeal swab, presence of viral mutation(s) within the areas targeted by this assay, and inadequate number of viral copies (<250 copies / mL). A negative result must be combined with clinical observations, patient history, and epidemiological information. Fact Sheet for Patients:   StrictlyIdeas.no  Fact Sheet for Healthcare Providers: BankingDealers.co.za This test is not yet approved or cleared  by the [place] FDA and has been authorized for detection and/or diagnosis of SARS-CoV-2 by FDA under an Emergency Use Authorization (EUA).  This EUA will remain in effect (meaning this test can be used) for the duration of the COVID-19 declaration under Section 564(b)(1) of the Act, 21 U.S.C. section 360bbb-3(b)(1), unless the authorization is terminated or revoked sooner. Performed at [facility], [place], [place] [address]   C Difficile Quick Screen w PCR reflex     Status: None   Collection Time: 10/15/19  9:15 AM   Specimen: Urine, Clean Catch; Stool  Result Value Ref Range Status   C Diff antigen NEGATIVE NEGATIVE Final   C Diff toxin NEGATIVE NEGATIVE Final    C Diff interpretation No C. difficile detected.  Final    Comment: Performed at [facility], [address] [place], [place] [address]  Gastrointestinal Panel by PCR , Stool     Status: None   Collection Time: 10/15/19  9:15 AM   Specimen: Urine, Clean Catch; Stool  Result Value Ref Range Status   Campylobacter species NOT DETECTED NOT DETECTED Final   Plesimonas shigelloides NOT DETECTED NOT DETECTED Final   Salmonella species NOT DETECTED NOT DETECTED Final   Yersinia enterocolitica NOT DETECTED NOT DETECTED Final   Vibrio species NOT DETECTED NOT DETECTED Final   Vibrio cholerae NOT DETECTED NOT DETECTED Final   Enteroaggregative E coli (EAEC) NOT DETECTED NOT DETECTED Final   Enteropathogenic E coli (EPEC) NOT DETECTED NOT DETECTED Final   Enterotoxigenic E coli (ETEC) NOT DETECTED NOT DETECTED Final   Shiga like toxin producing E coli (STEC) NOT DETECTED NOT DETECTED Final   Shigella/Enteroinvasive E coli (EIEC) NOT DETECTED NOT DETECTED Final   Cryptosporidium NOT DETECTED NOT DETECTED Final   Cyclospora cayetanensis NOT DETECTED NOT DETECTED Final   Entamoeba histolytica NOT DETECTED NOT DETECTED Final   Giardia lamblia NOT DETECTED NOT DETECTED Final   Adenovirus F40/41 NOT DETECTED NOT DETECTED Final   Astrovirus NOT DETECTED NOT DETECTED Final   Norovirus GI/GII NOT DETECTED NOT DETECTED Final   Rotavirus A NOT DETECTED NOT DETECTED Final   Sapovirus (I, II, IV, and V) NOT DETECTED NOT DETECTED Final    Comment: Performed at [facility], [address], [place], [place] [address]      Radiology Studies: No results found.   Scheduled Meds: . amphetamine-dextroamphetamine  30 mg Oral Daily  . atorvastatin  20 mg Oral Daily  . B-complex with vitamin C  1 tablet Oral Daily  . cyclobenzaprine  5 mg Oral TID  . dicyclomine  20 mg Oral TID AC  . diphenoxylate-atropine  2 tablet Oral BID  . DULoxetine  60 mg Oral Daily  . enoxaparin (LOVENOX)  injection  40 mg Subcutaneous Q24H  . folic acid  1 mg Oral Daily  . lipase/protease/amylase  24,000 Units Oral TID AC  . octreotide  100 mcg Subcutaneous Q8H  . pantoprazole  40 mg Oral BID AC  . sodium chloride flush  3 mL Intravenous Q12H  . valACYclovir  1,000 mg Oral Q12H  . cyanocobalamin  1,000 mcg Oral Daily   Continuous Infusions: . 0.9 % NaCl with KCl 40 mEq / L 125 mL/hr (10/18/19 1201)     LOS: 3 days     Enzo Bi, MD [facility] If 7PM-7AM, please contact night-coverage 10/18/2019, 3:29 PM

## 2019-10-18 NOTE — TOC Initial Note (Signed)
Transition of Care [facility]) - Initial/Assessment Note    Patient Details  Name: Molly Cisneros MRN: [id number] Date of Birth: [birth year]  Transition of [facility]) CM/SW Contact:    Candie Chroman, LCSW Phone Number: 10/18/2019, 9:47 AM  Clinical Narrative: Readmission prevention screen complete. CSW met with patient. No supports at bedside. CSW introduced role and explained that discharge planning would be discussed. Patient's PCP is Halina Maidens, MD at [facility]. She drives herself to appointments. Pharmacy is [facility] in [place]. She typically has no issues with obtaining medications but said that Octreotide and Dificid are expensive. She was able to get the Dificid brought down from $4000 to $40 and the Octreotide costs around $200 for a 3 month supply. She reports her GI physician, Dr. Marius Ditch is considering switching her to the IV form of this medication which will last 3 months. Patient had no home health and did not use any DME prior to admission. No further concerns. CSW encouraged patient to contact CSW as needed. CSW will continue to follow patient for support and facilitate return home when stable.  Expected Discharge Plan: Home/Self Care Barriers to Discharge: Continued Medical Work up   Patient Goals and CMS Choice     Choice offered to / list presented to : NA  Expected Discharge Plan and Services Expected Discharge Plan: Home/Self Care     Post Acute Care Choice: NA Living arrangements for the past 2 months: Single Family Home                                      Prior Living Arrangements/Services Living arrangements for the past 2 months: Single Family Home Lives with:: Parents Patient language and need for interpreter reviewed:: Yes Do you feel safe going back to the place where you live?: Yes      Need for Family Participation in Patient Care: Yes (Comment) Care giver support system in place?: Yes (comment)   Criminal Activity/Legal Involvement  Pertinent to Current Situation/Hospitalization: No - Comment as needed  Activities of Daily Living Home Assistive Devices/Equipment: None ADL Screening (condition at time of admission) Patient's cognitive ability adequate to safely complete daily activities?: Yes Is the patient deaf or have difficulty hearing?: No Does the patient have difficulty seeing, even when wearing glasses/contacts?: No Does the patient have difficulty concentrating, remembering, or making decisions?: No Patient able to express need for assistance with ADLs?: Yes Does the patient have difficulty dressing or bathing?: No Independently performs ADLs?: Yes (appropriate for developmental age) Does the patient have difficulty walking or climbing stairs?: No Weakness of Legs: None Weakness of Arms/Hands: None  Permission Sought/Granted                  Emotional Assessment Appearance:: Appears stated age Attitude/Demeanor/Rapport: Engaged, Gracious Affect (typically observed): Accepting, Appropriate, Calm, Pleasant Orientation: : Oriented to Self, Oriented to Place, Oriented to  Time, Oriented to Situation Alcohol / Substance Use: Not Applicable Psych Involvement: No (comment)  Admission diagnosis:  AKI (acute kidney injury) ([place]) [N17.9] Acute kidney injury ([place]) [N17.9] Hypotension, unspecified hypotension type [I95.9] Syncope, unspecified syncope type [R55] Diarrhea, unspecified type [R19.7] Patient Active Problem List   Diagnosis Date Noted  . Vitamin D deficiency 07/11/2019  . Sepsis ([place]) 07/01/2019  . AKI (acute kidney injury) ([place]) 07/01/2019  . GERD (gastroesophageal reflux disease) 07/01/2019  . Depression with anxiety 07/01/2019  . Dehydration   .  Folate deficiency 06/30/2019  . B12 deficiency 06/30/2019  . Orthostasis 06/21/2019  . Hypochloremia   . Hyponatremia 02/13/2019  . Hypokalemia 02/05/2019  . Chronic diarrhea of unknown origin   . Essential hypertension 11/03/2018  .  Hyperlipidemia, mixed 11/03/2018  . Degeneration of C5-C6 intervertebral disc 08/31/2018  . Herniation of left side of L4-L5 intervertebral disc 06/05/2017  . Obesity (BMI 30-39.9) 06/05/2017  . Chronic left-sided low back pain without sciatica 07/23/2015   PCP:  Glean Hess, MD Pharmacy:   [facility], [place] [address] Phone: [phone] Fax: [phone]  [id number]: [phone]: [phone] [id number]: [phone]: [address] [id number] [place]: [phone]: [phone]   Social Determinants of Health (SDCrescentInterventions    Readmission Risk Interventions Readmission Risk Prevention Plan 10/18/2019 07/05/2019 06/16/2019  Transportation Screening Complete Complete Complete  PCP or Specialist Appt within 3-5 Days Complete - Complete  HRI or [place] - (No Data)  Social Work Consult for Re[place]nectadylanning/Counseling Complete - -  Palliative Care Screening Not Applicable - Not Applicable  Medication Review (RNLlano del MedioComplete Complete Complete  PCP or Specialist appointment within 3-5 days of discharge - Complete -  HRExcelr [place] Complete -  Pa[place] Not Applicable -  Some recent data might be hidden

## 2019-10-19 DIAGNOSIS — R109 Unspecified abdominal pain: Secondary | ICD-10-CM

## 2019-10-19 LAB — CBC
HCT: 30 % — ABNORMAL LOW (ref 36.0–46.0)
Hemoglobin: 10.2 g/dL — ABNORMAL LOW (ref 12.0–15.0)
MCH: 28.8 pg (ref 26.0–34.0)
MCHC: 34 g/dL (ref 30.0–36.0)
MCV: 84.7 fL (ref 80.0–100.0)
Platelets: 367 10*3/uL (ref 150–400)
RBC: 3.54 MIL/uL — ABNORMAL LOW (ref 3.87–5.11)
RDW: 12.8 % (ref 11.5–15.5)
WBC: 9.8 10*3/uL (ref 4.0–10.5)
nRBC: 0 % (ref 0.0–0.2)

## 2019-10-19 LAB — BASIC METABOLIC PANEL
Anion gap: 4 — ABNORMAL LOW (ref 5–15)
BUN: 14 mg/dL (ref 6–20)
CO2: 21 mmol/L — ABNORMAL LOW (ref 22–32)
Calcium: 8.3 mg/dL — ABNORMAL LOW (ref 8.9–10.3)
Chloride: 115 mmol/L — ABNORMAL HIGH (ref 98–111)
Creatinine, Ser: 1.33 mg/dL — ABNORMAL HIGH (ref 0.44–1.00)
GFR calc Af Amer: 60 mL/min — ABNORMAL LOW (ref >60)
GFR calc non Af Amer: 52 mL/min — ABNORMAL LOW (ref >60)
Glucose, Bld: 93 mg/dL (ref 70–99)
Potassium: 4.2 mmol/L (ref 3.5–5.1)
Sodium: 140 mmol/L (ref 135–145)

## 2019-10-19 LAB — MAGNESIUM: Magnesium: 1.8 mg/dL (ref 1.7–2.4)

## 2019-10-19 NOTE — Progress Notes (Signed)
PROGRESS NOTE    Molly Cisneros  [id number] DOB: March 12, 1986 DOA: 10/15/2019 PCP: Glean Hess, MD   Brief Narrative:  Molly E Jonesis a 34 y.o.Caucasian femalewith medical history significant forhypertension, dyslipidemia and anxiety who presents to the emergency room after she had 2 syncopal episodes at home. Patient states that she has been having loose watery stools since April, 2020and usually has bouts where it gets worse with increased frequencyresulting in dehydration requiring hospitalization. Per patient frequency of diarrhea increased over the last 3 days and has been associated with nausea and vomitingas well as abdominal pain mostly in the periumbilical area.  6/2: Patient seen and examined.  Frequency of diarrhea has improved somewhat but still persistent.  Kidney function improving.  Followed by nephrology.   Assessment & Plan:   Active Problems:   Obesity (BMI 30-39.9)   Essential hypertension   Chronic diarrhea of unknown origin   Hypokalemia   Hypomagnesemia   Hyponatremia   Dehydration   AKI (acute kidney injury) ([place])   Depression with anxiety   Abdominal pain   Lactic acidosis  Acute on Chronic diarrhea and abdominal pain Etiology unclear Patient has had extensive work-up in the past with [facility].  Ruled out inflammatory bowel diseases, Celiac. --C diff and GI path both neg on presentation PLAN: --continue MIVF@125  --Lomotil BID --home Bentyl and octreotide as scheduled --Trial Creon scheduled TID --Norco PRN for pain --Follow-up with GI as an outpatient  Sepsis, ruled out -- hypotension, tachycardia, leukocytosis due to dehydration and hemoconcentration, all improved after IVF. --C diff and GI path both neg  Acute kidney injury, POA, improved --Cr 2.03 on presentation.  Baseline ~1.2.  Multifactorial and secondary to ATN from hypotension related to volume depletion from diarrhea as well as concomitant use of diuretic therapy. --Creatinine  slowly improving --Followed by nephrology PLAN: --continue MIVF@125  --Hold home hydrochlorothiazide/Lisinopril  Hypokalemia, resolved Hypomag Most likely related to GI losses from diarrhea as well as from diuretic use Supplement PO potassium and IV mag PRN  Hyponatremia, resolved Most likely hypovolemic Expect improvement in sodium levels with IV fluid resuscitation  Anxiety Continue home Xanax   DVT prophylaxis: Lovenox Code Status: Full Family Communication: None today Disposition Plan: Status is: Inpatient  Remains inpatient appropriate because:Inpatient level of care appropriate due to severity of illness   Dispo: The patient is from: Home              Anticipated d/c is to: Home              Anticipated d/c date is: 2 days              Patient currently is not medically stable to d/c.  Still with persistent diarrhea.  Kidney function improving but not quite at baseline.  Remains on IV fluids and aggressive antidiarrheal regimen.        Consultants:   Nephrology  Procedures:   none  Antimicrobials:   none    Subjective: Seen and examined.  No acute events overnight.  Continues to have some diarrhea.  No frequency decreasing.  Objective: Vitals:   10/18/19 0556 10/18/19 1157 10/18/19 2026 10/19/19 1417  BP: 105/80 113/77 108/78 119/83  Pulse: 88 88 80 96  Resp: 16  16 16   Temp: 97.6 F (36.4 C) 97.7 F (36.5 C) 98.1 F (36.7 C) 98 F (36.7 C)  TempSrc: Oral Oral Oral Oral  SpO2: 98% 99% 99% 99%  Weight:      Height:  Intake/Output Summary (Last 24 hours) at 10/19/2019 1536 Last data filed at 10/19/2019 1042 Gross per 24 hour  Intake 2809.33 ml  Output --  Net 2809.33 ml   Filed Weights   10/15/19 0638 10/15/19 2104  Weight: 97.7 kg 101 kg    Examination:  General exam: Appears calm and comfortable  Respiratory system: Clear to auscultation. Respiratory effort normal. Cardiovascular system: S1 & S2 heard, RRR. No JVD,  murmurs, rubs, gallops or clicks. No pedal edema. Gastrointestinal system: Abdomen is nondistended, soft and nontender. No organomegaly or masses felt. Normal bowel sounds heard. Central nervous system: Alert and oriented. No focal neurological deficits. Extremities: Symmetric 5 x 5 power. Skin: No rashes, lesions or ulcers Psychiatry: Judgement and insight appear normal. Mood & affect appropriate.     Data Reviewed: I have personally reviewed following labs and imaging studies  CBC: Recent Labs  Lab 10/15/19 0645 10/16/19 0609 10/17/19 0459 10/18/19 0654 10/19/19 0517  WBC 16.0* 10.3 10.6* 9.1 9.8  HGB 13.5 12.9 11.5* 10.1* 10.2*  HCT 39.9 38.3 33.6* 30.1* 30.0*  MCV 85.1 85.5 85.5 85.3 84.7  PLT 495* 453* 385 345 [id number]   Basic Metabolic Panel: Recent Labs  Lab 10/15/19 0645 10/16/19 0609 10/17/19 0459 10/18/19 0654 10/19/19 0517  NA 130* 132* 138 137 140  K 3.4* 3.7 4.7 4.4 4.2  CL 99 105 113* 111 115*  CO2 19* 18* 20* 21* 21*  GLUCOSE 122* 125* 103* 93 93  BUN 18 11 8 8 14   CREATININE 2.03* 1.20* 1.27* 1.36* 1.33*  CALCIUM 9.5 9.3 8.7* 8.2* 8.3*  MG  --   --  1.6* 1.6* 1.8   GFR: Estimated Creatinine Clearance: 78.6 mL/min (A) (by C-G formula based on SCr of 1.33 mg/dL (H)). Liver Function Tests: Recent Labs  Lab 10/15/19 0645  AST 34  ALT 40  ALKPHOS 70  BILITOT 0.6  PROT 7.6  ALBUMIN 4.3   Recent Labs  Lab 10/15/19 0645  LIPASE 41   No results for input(s): AMMONIA in the last 168 hours. Coagulation Profile: No results for input(s): INR, PROTIME in the last 168 hours. Cardiac Enzymes: No results for input(s): CKTOTAL, CKMB, CKMBINDEX, TROPONINI in the last 168 hours. BNP (last 3 results) No results for input(s): PROBNP in the last 8760 hours. HbA1C: No results for input(s): HGBA1C in the last 72 hours. CBG: No results for input(s): GLUCAP in the last 168 hours. Lipid Profile: No results for input(s): CHOL, HDL, LDLCALC, TRIG, CHOLHDL,  LDLDIRECT in the last 72 hours. Thyroid Function Tests: No results for input(s): TSH, T4TOTAL, FREET4, T3FREE, THYROIDAB in the last 72 hours. Anemia Panel: No results for input(s): VITAMINB12, FOLATE, FERRITIN, TIBC, IRON, RETICCTPCT in the last 72 hours. Sepsis Labs: Recent Labs  Lab 10/15/19 0645 10/15/19 0915 10/17/19 0943 10/18/19 1810  LATICACIDVEN 2.8* 2.1* 2.0* 1.4    Recent Results (from the past 240 hour(s))  SARS Coronavirus 2 by RT PCR (hospital order, performed in [facility] hospital lab) Nasopharyngeal Nasopharyngeal Swab     Status: None   Collection Time: 10/15/19  7:24 AM   Specimen: Nasopharyngeal Swab  Result Value Ref Range Status   SARS Coronavirus 2 NEGATIVE NEGATIVE Final    Comment: (NOTE) SARS-CoV-2 target nucleic acids are NOT DETECTED. The SARS-CoV-2 RNA is generally detectable in upper and lower respiratory specimens during the acute phase of infection. The lowest concentration of SARS-CoV-2 viral copies this assay can detect is 250 copies / mL. A negative result does  not preclude SARS-CoV-2 infection and should not be used as the sole basis for treatment or other patient management decisions.  A negative result may occur with improper specimen collection / handling, submission of specimen other than nasopharyngeal swab, presence of viral mutation(s) within the areas targeted by this assay, and inadequate number of viral copies (<250 copies / mL). A negative result must be combined with clinical observations, patient history, and epidemiological information. Fact Sheet for Patients:   StrictlyIdeas.no Fact Sheet for Healthcare Providers: BankingDealers.co.za This test is not yet approved or cleared  by the [place] FDA and has been authorized for detection and/or diagnosis of SARS-CoV-2 by FDA under an Emergency Use Authorization (EUA).  This EUA will remain in effect (meaning this test can be  used) for the duration of the COVID-19 declaration under Section 564(b)(1) of the Act, 21 U.S.C. section 360bbb-3(b)(1), unless the authorization is terminated or revoked sooner. Performed at [facility], [place], [place] [address]   C Difficile Quick Screen w PCR reflex     Status: None   Collection Time: 10/15/19  9:15 AM   Specimen: Urine, Clean Catch; Stool  Result Value Ref Range Status   C Diff antigen NEGATIVE NEGATIVE Final   C Diff toxin NEGATIVE NEGATIVE Final   C Diff interpretation No C. difficile detected.  Final    Comment: Performed at [facility], [address] [place], [place] [address]  Gastrointestinal Panel by PCR , Stool     Status: None   Collection Time: 10/15/19  9:15 AM   Specimen: Urine, Clean Catch; Stool  Result Value Ref Range Status   Campylobacter species NOT DETECTED NOT DETECTED Final   Plesimonas shigelloides NOT DETECTED NOT DETECTED Final   Salmonella species NOT DETECTED NOT DETECTED Final   Yersinia enterocolitica NOT DETECTED NOT DETECTED Final   Vibrio species NOT DETECTED NOT DETECTED Final   Vibrio cholerae NOT DETECTED NOT DETECTED Final   Enteroaggregative E coli (EAEC) NOT DETECTED NOT DETECTED Final   Enteropathogenic E coli (EPEC) NOT DETECTED NOT DETECTED Final   Enterotoxigenic E coli (ETEC) NOT DETECTED NOT DETECTED Final   Shiga like toxin producing E coli (STEC) NOT DETECTED NOT DETECTED Final   Shigella/Enteroinvasive E coli (EIEC) NOT DETECTED NOT DETECTED Final   Cryptosporidium NOT DETECTED NOT DETECTED Final   Cyclospora cayetanensis NOT DETECTED NOT DETECTED Final   Entamoeba histolytica NOT DETECTED NOT DETECTED Final   Giardia lamblia NOT DETECTED NOT DETECTED Final   Adenovirus F40/41 NOT DETECTED NOT DETECTED Final   Astrovirus NOT DETECTED NOT DETECTED Final   Norovirus GI/GII NOT DETECTED NOT DETECTED Final   Rotavirus A NOT DETECTED NOT DETECTED Final   Sapovirus (I, II, IV,  and V) NOT DETECTED NOT DETECTED Final    Comment: Performed at [facility], [address] [place], [place] [address]         Radiology Studies: No results found.      Scheduled Meds:  amphetamine-dextroamphetamine  30 mg Oral Daily   atorvastatin  20 mg Oral Daily   B-complex with vitamin C  1 tablet Oral Daily   cyclobenzaprine  5 mg Oral TID   dicyclomine  20 mg Oral TID AC   diphenoxylate-atropine  2 tablet Oral BID   DULoxetine  60 mg Oral Daily   enoxaparin (LOVENOX) injection  40 mg Subcutaneous [id number]   folic acid  1 mg Oral Daily   lipase/protease/amylase  24,000 Units Oral  TID AC   octreotide  100 mcg Subcutaneous Q8H   pantoprazole  40 mg Oral BID AC   sodium chloride flush  3 mL Intravenous Q12H   valACYclovir  1,000 mg Oral Q12H   cyanocobalamin  1,000 mcg Oral Daily   Continuous Infusions:  0.9 % NaCl with KCl 40 mEq / L 125 mL/hr (10/19/19 1244)     LOS: 4 days    Time spent: 35 minutes    Sidney Ace, MD [facility] Pager [phone]  If 7PM-7AM, please contact night-coverage 10/19/2019, 3:36 PM

## 2019-10-19 NOTE — Progress Notes (Signed)
[facility]  ROUNDING NOTE   Subjective:  Patient very well-known to [place]. She has underlying chronic diarrhea which has been evaluated extensively at [facility]. Now has had a recurrence. Patient did have acute kidney injury and creatinine was high as 2.0 recently. Currently creatinine down to 1.3.   Objective:  Vital signs in last 24 hours:  Temp:  [98 F (36.7 C)-98.1 F (36.7 C)] 98 F (36.7 C) (06/02 1417) Pulse Rate:  [80-96] 96 (06/02 1417) Resp:  [16] 16 (06/02 1417) BP: (108-119)/(78-83) 119/83 (06/02 1417) SpO2:  [99 %] 99 % (06/02 1417)  Weight change:  Filed Weights   10/15/19 0638 10/15/19 2104  Weight: 97.7 kg 101 kg    Intake/Output: I/O last 3 completed shifts: In: 4715.7 [P.O.:1080; I.V.:3635.7] Out: -    Intake/Output this shift:  Total I/O In: 240 [P.O.:240] Out: -   Physical Exam: General: No acute distress  Head: Normocephalic, atraumatic. Moist oral mucosal membranes  Eyes: Anicteric  Neck: Supple, trachea midline  Lungs:  Clear to auscultation, normal effort  Heart: S1S2 no rubs  Abdomen:  Soft, nontender, bowel sounds present  Extremities: No peripheral edema.  Neurologic: Awake, alert, following commands  Skin: No lesions  Access:     Basic Metabolic Panel: Recent Labs  Lab 10/15/19 0645 10/15/19 0645 10/16/19 [id number] 10/16/19 0609 10/17/19 0459 10/18/19 0654 10/19/19 0517  NA 130*  --  132*  --  138 137 140  K 3.4*  --  3.7  --  4.7 4.4 4.2  CL 99  --  105  --  113* 111 115*  CO2 19*  --  18*  --  20* 21* 21*  GLUCOSE 122*  --  125*  --  103* 93 93  BUN 18  --  11  --  8 8 14   CREATININE 2.03*  --  1.20*  --  1.27* 1.36* 1.33*  CALCIUM 9.5   < > 9.3   < > 8.7* 8.2* 8.3*  MG  --   --   --   --  1.6* 1.6* 1.8   < > = values in this interval not displayed.    Liver Function Tests: Recent Labs  Lab 10/15/19 0645  AST 34  ALT 40  ALKPHOS 70  BILITOT 0.6  PROT 7.6  ALBUMIN 4.3    Recent Labs  Lab 10/15/19 0645  LIPASE 41   No results for input(s): AMMONIA in the last 168 hours.  CBC: Recent Labs  Lab 10/15/19 0645 10/16/19 0609 10/17/19 0459 10/18/19 0654 10/19/19 0517  WBC 16.0* 10.3 10.6* 9.1 9.8  HGB 13.5 12.9 11.5* 10.1* 10.2*  HCT 39.9 38.3 33.6* 30.1* 30.0*  MCV 85.1 85.5 85.5 85.3 84.7  PLT 495* 453* 385 345 367    Cardiac Enzymes: No results for input(s): CKTOTAL, CKMB, CKMBINDEX, TROPONINI in the last 168 hours.  BNP: Invalid input(s): POCBNP  CBG: No results for input(s): GLUCAP in the last 168 hours.  Microbiology: Results for orders placed or performed during the hospital encounter of 10/15/19  SARS Coronavirus 2 by RT PCR (hospital order, performed in [facility] hospital lab) Nasopharyngeal Nasopharyngeal Swab     Status: None   Collection Time: 10/15/19  7:24 AM   Specimen: Nasopharyngeal Swab  Result Value Ref Range Status   SARS Coronavirus 2 NEGATIVE NEGATIVE Final    Comment: (NOTE) SARS-CoV-2 target nucleic acids are NOT DETECTED. The SARS-CoV-2 RNA is generally detectable in upper and lower  respiratory specimens during the acute phase of infection. The lowest concentration of SARS-CoV-2 viral copies this assay can detect is 250 copies / mL. A negative result does not preclude SARS-CoV-2 infection and should not be used as the sole basis for treatment or other patient management decisions.  A negative result may occur with improper specimen collection / handling, submission of specimen other than nasopharyngeal swab, presence of viral mutation(s) within the areas targeted by this assay, and inadequate number of viral copies (<250 copies / mL). A negative result must be combined with clinical observations, patient history, and epidemiological information. Fact Sheet for Patients:   StrictlyIdeas.no Fact Sheet for Healthcare Providers: BankingDealers.co.za This test is  not yet approved or cleared  by the [place] FDA and has been authorized for detection and/or diagnosis of SARS-CoV-2 by FDA under an Emergency Use Authorization (EUA).  This EUA will remain in effect (meaning this test can be used) for the duration of the COVID-19 declaration under Section 564(b)(1) of the Act, 21 U.S.C. section 360bbb-3(b)(1), unless the authorization is terminated or revoked sooner. Performed at [facility], [place], [place] [address]   C Difficile Quick Screen w PCR reflex     Status: None   Collection Time: 10/15/19  9:15 AM   Specimen: Urine, Clean Catch; Stool  Result Value Ref Range Status   C Diff antigen NEGATIVE NEGATIVE Final   C Diff toxin NEGATIVE NEGATIVE Final   C Diff interpretation No C. difficile detected.  Final    Comment: Performed at [facility], [address] [place], [place] [address]  Gastrointestinal Panel by PCR , Stool     Status: None   Collection Time: 10/15/19  9:15 AM   Specimen: Urine, Clean Catch; Stool  Result Value Ref Range Status   Campylobacter species NOT DETECTED NOT DETECTED Final   Plesimonas shigelloides NOT DETECTED NOT DETECTED Final   Salmonella species NOT DETECTED NOT DETECTED Final   Yersinia enterocolitica NOT DETECTED NOT DETECTED Final   Vibrio species NOT DETECTED NOT DETECTED Final   Vibrio cholerae NOT DETECTED NOT DETECTED Final   Enteroaggregative E coli (EAEC) NOT DETECTED NOT DETECTED Final   Enteropathogenic E coli (EPEC) NOT DETECTED NOT DETECTED Final   Enterotoxigenic E coli (ETEC) NOT DETECTED NOT DETECTED Final   Shiga like toxin producing E coli (STEC) NOT DETECTED NOT DETECTED Final   Shigella/Enteroinvasive E coli (EIEC) NOT DETECTED NOT DETECTED Final   Cryptosporidium NOT DETECTED NOT DETECTED Final   Cyclospora cayetanensis NOT DETECTED NOT DETECTED Final   Entamoeba histolytica NOT DETECTED NOT DETECTED Final   Giardia lamblia NOT DETECTED NOT  DETECTED Final   Adenovirus F40/41 NOT DETECTED NOT DETECTED Final   Astrovirus NOT DETECTED NOT DETECTED Final   Norovirus GI/GII NOT DETECTED NOT DETECTED Final   Rotavirus A NOT DETECTED NOT DETECTED Final   Sapovirus (I, II, IV, and V) NOT DETECTED NOT DETECTED Final    Comment: Performed at [facility], [address] [place],  [address]    Coagulation Studies: No results for input(s): LABPROT, INR in the last 72 hours.  Urinalysis: No results for input(s): COLORURINE, LABSPEC, PHURINE, GLUCOSEU, HGBUR, BILIRUBINUR, KETONESUR, PROTEINUR, UROBILINOGEN, NITRITE, LEUKOCYTESUR in the last 72 hours.  Invalid input(s): APPERANCEUR    Imaging: No results found.   Medications:   . 0.9 % NaCl with KCl 40 mEq / L 125 mL/hr (10/19/19 1244)   . amphetamine-dextroamphetamine  30 mg Oral Daily  .  atorvastatin  20 mg Oral Daily  . B-complex with vitamin C  1 tablet Oral Daily  . cyclobenzaprine  5 mg Oral TID  . dicyclomine  20 mg Oral TID AC  . diphenoxylate-atropine  2 tablet Oral BID  . DULoxetine  60 mg Oral Daily  . enoxaparin (LOVENOX) injection  40 mg Subcutaneous Q24H  . folic acid  1 mg Oral Daily  . lipase/protease/amylase  24,000 Units Oral TID AC  . octreotide  100 mcg Subcutaneous Q8H  . pantoprazole  40 mg Oral BID AC  . sodium chloride flush  3 mL Intravenous Q12H  . valACYclovir  1,000 mg Oral Q12H  . cyanocobalamin  1,000 mcg Oral Daily   ALPRAZolam, HYDROcodone-acetaminophen, ondansetron **OR** ondansetron (ZOFRAN) IV, promethazine, sodium chloride flush  Assessment/ Plan:  34 y.o. female with a PMHx ofchronic diarrhea, hypertension, hypokalemia, hyperlipidemia, anxiety admitted with worsening diarrhea previously evaluated extensively at [facility].  1.  Acute kidney injury is likely secondary to diarrhea and volume depletion. 2.  Metabolic acidosis.  Secondary to diarrhea. 3.  Anemia unspecified. 4.  Chronic diarrhea  with acute exacerbation.  Plan: Patient well-known to [place] from prior admission.  She has had extensive work-up at [facility] for the chronic diarrhea.  She has been on octreotide but still has some acute episodes of diarrhea.  Continue IV fluid hydration and close monitoring of serum electrolytes.   LOS: 4 Molly Cisneros 6/2/20213:08 PM

## 2019-10-20 LAB — BASIC METABOLIC PANEL
Anion gap: 5 (ref 5–15)
BUN: 11 mg/dL (ref 6–20)
CO2: 22 mmol/L (ref 22–32)
Calcium: 8.7 mg/dL — ABNORMAL LOW (ref 8.9–10.3)
Chloride: 114 mmol/L — ABNORMAL HIGH (ref 98–111)
Creatinine, Ser: 1.11 mg/dL — ABNORMAL HIGH (ref 0.44–1.00)
GFR calc Af Amer: >60 mL/min (ref >60)
GFR calc non Af Amer: >60 mL/min (ref >60)
Glucose, Bld: 115 mg/dL — ABNORMAL HIGH (ref 70–99)
Potassium: 5.3 mmol/L — ABNORMAL HIGH (ref 3.5–5.1)
Sodium: 141 mmol/L (ref 135–145)

## 2019-10-20 LAB — MAGNESIUM: Magnesium: 1.6 mg/dL — ABNORMAL LOW (ref 1.7–2.4)

## 2019-10-20 MED ORDER — SODIUM CHLORIDE 0.9 % IV SOLN: INTRAVENOUS | Status: DC

## 2019-10-20 MED ORDER — MAGNESIUM SULFATE 2 GM/50ML IV SOLN
2.0000 g | Freq: Once | INTRAVENOUS | Status: AC
  Administered 2019-10-20: 2 g via INTRAVENOUS
  Filled 2019-10-20: qty 50

## 2019-10-20 NOTE — Progress Notes (Signed)
[facility]  ROUNDING NOTE   Subjective:  Patient continues to have loose and frequent stools. Potassium up to 5.3 today. Creatinine down to 1.11.   Objective:  Vital signs in last 24 hours:  Temp:  [98 F (36.7 C)-98.4 F (36.9 C)] 98.2 F (36.8 C) (06/03 1316) Pulse Rate:  [87-96] 95 (06/03 1316) Resp:  [16-18] 18 (06/03 1316) BP: (117-137)/(80-93) 132/88 (06/03 1316) SpO2:  [96 %-100 %] 99 % (06/03 1316)  Weight change:  Filed Weights   10/15/19 0638 10/15/19 2104  Weight: 97.7 kg 101 kg    Intake/Output: I/O last 3 completed shifts: In: 5755.1 [P.O.:360; I.V.:5395.1] Out: -    Intake/Output this shift:  No intake/output data recorded.  Physical Exam: General: No acute distress  Head: Normocephalic, atraumatic. Moist oral mucosal membranes  Eyes: Anicteric  Neck: Supple, trachea midline  Lungs:  Clear to auscultation, normal effort  Heart: S1S2 no rubs  Abdomen:  Soft, nontender, bowel sounds present  Extremities: No peripheral edema.  Neurologic: Awake, alert, following commands  Skin: No lesions  Access:     Basic Metabolic Panel: Recent Labs  Lab 10/16/19 0609 10/16/19 0609 10/17/19 0459 10/17/19 0459 10/18/19 0654 10/19/19 0517 10/20/19 0808  NA 132*  --  138  --  137 140 141  K 3.7  --  4.7  --  4.4 4.2 5.3*  CL 105  --  113*  --  111 115* 114*  CO2 18*  --  20*  --  21* 21* 22  GLUCOSE 125*  --  103*  --  93 93 115*  BUN 11  --  8  --  8 14 11   CREATININE 1.20*  --  1.27*  --  1.36* 1.33* 1.11*  CALCIUM 9.3   < > 8.7*   < > 8.2* 8.3* 8.7*  MG  --   --  1.6*  --  1.6* 1.8 1.6*   < > = values in this interval not displayed.    Liver Function Tests: Recent Labs  Lab 10/15/19 0645  AST 34  ALT 40  ALKPHOS 70  BILITOT 0.6  PROT 7.6  ALBUMIN 4.3   Recent Labs  Lab 10/15/19 0645  LIPASE 41   No results for input(s): AMMONIA in the last 168 hours.  CBC: Recent Labs  Lab 10/15/19 0645 10/16/19 0609  10/17/19 0459 10/18/19 0654 10/19/19 0517  WBC 16.0* 10.3 10.6* 9.1 9.8  HGB 13.5 12.9 11.5* 10.1* 10.2*  HCT 39.9 38.3 33.6* 30.1* 30.0*  MCV 85.1 85.5 85.5 85.3 84.7  PLT 495* 453* 385 345 367    Cardiac Enzymes: No results for input(s): CKTOTAL, CKMB, CKMBINDEX, TROPONINI in the last 168 hours.  BNP: Invalid input(s): POCBNP  CBG: No results for input(s): GLUCAP in the last 168 hours.  Microbiology: Results for orders placed or performed during the hospital encounter of 10/15/19  SARS Coronavirus 2 by RT PCR (hospital order, performed in [facility] hospital lab) Nasopharyngeal Nasopharyngeal Swab     Status: None   Collection Time: 10/15/19  7:24 AM   Specimen: Nasopharyngeal Swab  Result Value Ref Range Status   SARS Coronavirus 2 NEGATIVE NEGATIVE Final    Comment: (NOTE) SARS-CoV-2 target nucleic acids are NOT DETECTED. The SARS-CoV-2 RNA is generally detectable in upper and lower respiratory specimens during the acute phase of infection. The lowest concentration of SARS-CoV-2 viral copies this assay can detect is 250 copies / mL. A negative result does not preclude SARS-CoV-2  infection and should not be used as the sole basis for treatment or other patient management decisions.  A negative result may occur with improper specimen collection / handling, submission of specimen other than nasopharyngeal swab, presence of viral mutation(s) within the areas targeted by this assay, and inadequate number of viral copies (<250 copies / mL). A negative result must be combined with clinical observations, patient history, and epidemiological information. Fact Sheet for Patients:   StrictlyIdeas.no Fact Sheet for Healthcare Providers: BankingDealers.co.za This test is not yet approved or cleared  by the [place] FDA and has been authorized for detection and/or diagnosis of SARS-CoV-2 by FDA under an Emergency Use  Authorization (EUA).  This EUA will remain in effect (meaning this test can be used) for the duration of the COVID-19 declaration under Section 564(b)(1) of the Act, 21 U.S.C. section 360bbb-3(b)(1), unless the authorization is terminated or revoked sooner. Performed at [facility], [place], [place] [address]   C Difficile Quick Screen w PCR reflex     Status: None   Collection Time: 10/15/19  9:15 AM   Specimen: Urine, Clean Catch; Stool  Result Value Ref Range Status   C Diff antigen NEGATIVE NEGATIVE Final   C Diff toxin NEGATIVE NEGATIVE Final   C Diff interpretation No C. difficile detected.  Final    Comment: Performed at [facility], [address] [place], [place] [address]  Gastrointestinal Panel by PCR , Stool     Status: None   Collection Time: 10/15/19  9:15 AM   Specimen: Urine, Clean Catch; Stool  Result Value Ref Range Status   Campylobacter species NOT DETECTED NOT DETECTED Final   Plesimonas shigelloides NOT DETECTED NOT DETECTED Final   Salmonella species NOT DETECTED NOT DETECTED Final   Yersinia enterocolitica NOT DETECTED NOT DETECTED Final   Vibrio species NOT DETECTED NOT DETECTED Final   Vibrio cholerae NOT DETECTED NOT DETECTED Final   Enteroaggregative E coli (EAEC) NOT DETECTED NOT DETECTED Final   Enteropathogenic E coli (EPEC) NOT DETECTED NOT DETECTED Final   Enterotoxigenic E coli (ETEC) NOT DETECTED NOT DETECTED Final   Shiga like toxin producing E coli (STEC) NOT DETECTED NOT DETECTED Final   Shigella/Enteroinvasive E coli (EIEC) NOT DETECTED NOT DETECTED Final   Cryptosporidium NOT DETECTED NOT DETECTED Final   Cyclospora cayetanensis NOT DETECTED NOT DETECTED Final   Entamoeba histolytica NOT DETECTED NOT DETECTED Final   Giardia lamblia NOT DETECTED NOT DETECTED Final   Adenovirus F40/41 NOT DETECTED NOT DETECTED Final   Astrovirus NOT DETECTED NOT DETECTED Final   Norovirus GI/GII NOT DETECTED NOT  DETECTED Final   Rotavirus A NOT DETECTED NOT DETECTED Final   Sapovirus (I, II, IV, and V) NOT DETECTED NOT DETECTED Final    Comment: Performed at [facility], [address] [place], [place] [address]    Coagulation Studies: No results for input(s): LABPROT, INR in the last 72 hours.  Urinalysis: No results for input(s): COLORURINE, LABSPEC, PHURINE, GLUCOSEU, HGBUR, BILIRUBINUR, KETONESUR, PROTEINUR, UROBILINOGEN, NITRITE, LEUKOCYTESUR in the last 72 hours.  Invalid input(s): APPERANCEUR    Imaging: No results found.   Medications:   . sodium chloride 125 mL/hr at 10/20/19 0931   . amphetamine-dextroamphetamine  30 mg Oral Daily  . atorvastatin  20 mg Oral Daily  . B-complex with vitamin C  1 tablet Oral Daily  . cyclobenzaprine  5 mg Oral TID  . dicyclomine  20 mg Oral TID AC  .  diphenoxylate-atropine  2 tablet Oral BID  . DULoxetine  60 mg Oral Daily  . enoxaparin (LOVENOX) injection  40 mg Subcutaneous Q24H  . folic acid  1 mg Oral Daily  . lipase/protease/amylase  24,000 Units Oral TID AC  . octreotide  100 mcg Subcutaneous Q8H  . pantoprazole  40 mg Oral BID AC  . sodium chloride flush  3 mL Intravenous Q12H  . valACYclovir  1,000 mg Oral Q12H  . cyanocobalamin  1,000 mcg Oral Daily   ALPRAZolam, HYDROcodone-acetaminophen, ondansetron **OR** ondansetron (ZOFRAN) IV, promethazine, sodium chloride flush  Assessment/ Plan:  34 y.o. female with a PMHx ofchronic diarrhea, hypertension, hypokalemia, hyperlipidemia, anxiety admitted with worsening diarrhea previously evaluated extensively at [facility].  1.  Acute kidney injury is likely secondary to diarrhea and volume depletion. 2.  Metabolic acidosis.  Secondary to diarrhea. 3.  Anemia unspecified. 4.  Chronic diarrhea with acute exacerbation.  Plan: Renal function has improved.  Creatinine down to 1.11.  Potassium a bit high at 5.3.  Continue to monitor for now.  Hold off on  Veltassa or Lokelma given her ongoing GI issues.  Further plan as patient progresses.   LOS: 5 Molly Cisneros 6/3/20211:49 PM

## 2019-10-20 NOTE — Progress Notes (Signed)
PROGRESS NOTE    Molly Cisneros  [id number] DOB: [birth year] DOA: 10/15/2019 PCP: Glean Hess, MD   Brief Narrative:  Molly E Jonesis a 34 y.o.Caucasian femalewith medical history significant forhypertension, dyslipidemia and anxiety who presents to the emergency room after she had 2 syncopal episodes at home. Patient states that she has been having loose watery stools since April, 2020and usually has bouts where it gets worse with increased frequencyresulting in dehydration requiring hospitalization. Per patient frequency of diarrhea increased over the last 3 days and has been associated with nausea and vomitingas well as abdominal pain mostly in the periumbilical area.  6/2: Patient seen and examined.  Frequency of diarrhea has improved somewhat but still persistent.  Kidney function improving.  Followed by nephrology.  6/3: Patient seen and examined.  Still with several episodes of loose stools.  Potassium slightly increased to 5.3 this morning.  Patient feels better overall.   Assessment & Plan:   Active Problems:   Obesity (BMI 30-39.9)   Essential hypertension   Chronic diarrhea of unknown origin   Hypokalemia   Hypomagnesemia   Hyponatremia   Dehydration   AKI (acute kidney injury) ([place])   Depression with anxiety   Abdominal pain   Lactic acidosis  Acute on Chronic diarrhea and abdominal pain Etiology unclear Patient has had extensive work-up in the past with [facility].  Ruled out inflammatory bowel diseases, Celiac. --C diff and GI path both neg on presentation PLAN: --continue MIVF@125  --Lomotil BID --home Bentyl and octreotide as scheduled --Trial Creon scheduled TID --Norco PRN for pain --Follow-up with GI as an outpatient  Sepsis, ruled out -- hypotension, tachycardia, leukocytosis due to dehydration and hemoconcentration, all improved after IVF. --C diff and GI path both neg  Acute kidney injury, POA, improved --Cr 2.03 on presentation.  Baseline  ~1.2.  Multifactorial and secondary to ATN from hypotension related to volume depletion from diarrhea as well as concomitant use of diuretic therapy. --Creatinine slowly improving --Followed by nephrology PLAN: --continue MIVF@125  --Hold home hydrochlorothiazide/Lisinopril  Hypokalemia, resolved Hypomag Most likely related to GI losses from diarrhea as well as from diuretic use Supplement PO potassium and IV mag PRN  Hyponatremia, resolved Most likely hypovolemic Expect improvement in sodium levels with IV fluid resuscitation  Anxiety Continue home Xanax   DVT prophylaxis: Lovenox Code Status: Full Family Communication: Family member at bedside Disposition Plan: Status is: Inpatient  Remains inpatient appropriate because:Inpatient level of care appropriate due to severity of illness   Dispo: The patient is from: Home              Anticipated d/c is to: Home              Anticipated d/c date is: 2 days              Patient currently is not medically stable to d/c.  Still with persistent diarrhea.  Kidney function improving but not quite at baseline.  Remains on IV fluids and aggressive antidiarrheal regimen.   Consultants:   Nephrology  Procedures:   none  Antimicrobials:   none    Subjective: Seen and examined.  No acute events overnight.  Continues to have some diarrhea.  No frequency decreasing.  Objective: Vitals:   10/19/19 1417 10/19/19 2003 10/20/19 0507 10/20/19 1316  BP: 119/83 (!) 137/93 117/80 132/88  Pulse: 96 87 94 95  Resp: 16 18 18 18   Temp: 98 F (36.7 C) 98.4 F (36.9 C) 98.4 F (36.9 C) 98.2  F (36.8 C)  TempSrc: Oral Oral Oral Oral  SpO2: 99% 100% 96% 99%  Weight:      Height:        Intake/Output Summary (Last 24 hours) at 10/20/2019 1445 Last data filed at 10/20/2019 0531 Gross per 24 hour  Intake 3305.78 ml  Output --  Net 3305.78 ml   Filed Weights   10/15/19 [id number] 10/15/19 2104  Weight: 97.7 kg 101 kg     Examination:  General exam: Appears calm and comfortable  Respiratory system: Clear to auscultation. Respiratory effort normal. Cardiovascular system: S1 & S2 heard, RRR. No JVD, murmurs, rubs, gallops or clicks. No pedal edema. Gastrointestinal system: Abdomen is nondistended, soft and nontender. No organomegaly or masses felt. Normal bowel sounds heard. Central nervous system: Alert and oriented. No focal neurological deficits. Extremities: Symmetric 5 x 5 power. Skin: No rashes, lesions or ulcers Psychiatry: Judgement and insight appear normal. Mood & affect appropriate.     Data Reviewed: I have personally reviewed following labs and imaging studies  CBC: Recent Labs  Lab 10/15/19 0645 10/16/19 0609 10/17/19 0459 10/18/19 0654 10/19/19 0517  WBC 16.0* 10.3 10.6* 9.1 9.8  HGB 13.5 12.9 11.5* 10.1* 10.2*  HCT 39.9 38.3 33.6* 30.1* 30.0*  MCV 85.1 85.5 85.5 85.3 84.7  PLT 495* 453* 385 345 [id number]   Basic Metabolic Panel: Recent Labs  Lab 10/16/19 0609 10/17/19 0459 10/18/19 0654 10/19/19 0517 10/20/19 0808  NA 132* 138 137 140 141  K 3.7 4.7 4.4 4.2 5.3*  CL 105 113* 111 115* 114*  CO2 18* 20* 21* 21* 22  GLUCOSE 125* 103* 93 93 115*  BUN 11 8 8 14 11   CREATININE 1.20* 1.27* 1.36* 1.33* 1.11*  CALCIUM 9.3 8.7* 8.2* 8.3* 8.7*  MG  --  1.6* 1.6* 1.8 1.6*   GFR: Estimated Creatinine Clearance: 94.1 mL/min (A) (by C-G formula based on SCr of 1.11 mg/dL (H)). Liver Function Tests: Recent Labs  Lab 10/15/19 0645  AST 34  ALT 40  ALKPHOS 70  BILITOT 0.6  PROT 7.6  ALBUMIN 4.3   Recent Labs  Lab 10/15/19 0645  LIPASE 41   No results for input(s): AMMONIA in the last 168 hours. Coagulation Profile: No results for input(s): INR, PROTIME in the last 168 hours. Cardiac Enzymes: No results for input(s): CKTOTAL, CKMB, CKMBINDEX, TROPONINI in the last 168 hours. BNP (last 3 results) No results for input(s): PROBNP in the last 8760 hours. HbA1C: No  results for input(s): HGBA1C in the last 72 hours. CBG: No results for input(s): GLUCAP in the last 168 hours. Lipid Profile: No results for input(s): CHOL, HDL, LDLCALC, TRIG, CHOLHDL, LDLDIRECT in the last 72 hours. Thyroid Function Tests: No results for input(s): TSH, T4TOTAL, FREET4, T3FREE, THYROIDAB in the last 72 hours. Anemia Panel: No results for input(s): VITAMINB12, FOLATE, FERRITIN, TIBC, IRON, RETICCTPCT in the last 72 hours. Sepsis Labs: Recent Labs  Lab 10/15/19 0645 10/15/19 0915 10/17/19 0943 10/18/19 1810  LATICACIDVEN 2.8* 2.1* 2.0* 1.4    Recent Results (from the past 240 hour(s))  SARS Coronavirus 2 by RT PCR (hospital order, performed in [facility] hospital lab) Nasopharyngeal Nasopharyngeal Swab     Status: None   Collection Time: 10/15/19  7:24 AM   Specimen: Nasopharyngeal Swab  Result Value Ref Range Status   SARS Coronavirus 2 NEGATIVE NEGATIVE Final    Comment: (NOTE) SARS-CoV-2 target nucleic acids are NOT DETECTED. The SARS-CoV-2 RNA is generally detectable in upper and lower  respiratory specimens during the acute phase of infection. The lowest concentration of SARS-CoV-2 viral copies this assay can detect is 250 copies / mL. A negative result does not preclude SARS-CoV-2 infection and should not be used as the sole basis for treatment or other patient management decisions.  A negative result may occur with improper specimen collection / handling, submission of specimen other than nasopharyngeal swab, presence of viral mutation(s) within the areas targeted by this assay, and inadequate number of viral copies (<250 copies / mL). A negative result must be combined with clinical observations, patient history, and epidemiological information. Fact Sheet for Patients:   StrictlyIdeas.no Fact Sheet for Healthcare Providers: BankingDealers.co.za This test is not yet approved or cleared  by the [place] FDA and has been authorized for detection and/or diagnosis of SARS-CoV-2 by FDA under an Emergency Use Authorization (EUA).  This EUA will remain in effect (meaning this test can be used) for the duration of the COVID-19 declaration under Section 564(b)(1) of the Act, 21 U.S.C. section 360bbb-3(b)(1), unless the authorization is terminated or revoked sooner. Performed at [facility], [place], [place] [address]   C Difficile Quick Screen w PCR reflex     Status: None   Collection Time: 10/15/19  9:15 AM   Specimen: Urine, Clean Catch; Stool  Result Value Ref Range Status   C Diff antigen NEGATIVE NEGATIVE Final   C Diff toxin NEGATIVE NEGATIVE Final   C Diff interpretation No C. difficile detected.  Final    Comment: Performed at [facility], [address] [place], [place] [address]  Gastrointestinal Panel by PCR , Stool     Status: None   Collection Time: 10/15/19  9:15 AM   Specimen: Urine, Clean Catch; Stool  Result Value Ref Range Status   Campylobacter species NOT DETECTED NOT DETECTED Final   Plesimonas shigelloides NOT DETECTED NOT DETECTED Final   Salmonella species NOT DETECTED NOT DETECTED Final   Yersinia enterocolitica NOT DETECTED NOT DETECTED Final   Vibrio species NOT DETECTED NOT DETECTED Final   Vibrio cholerae NOT DETECTED NOT DETECTED Final   Enteroaggregative E coli (EAEC) NOT DETECTED NOT DETECTED Final   Enteropathogenic E coli (EPEC) NOT DETECTED NOT DETECTED Final   Enterotoxigenic E coli (ETEC) NOT DETECTED NOT DETECTED Final   Shiga like toxin producing E coli (STEC) NOT DETECTED NOT DETECTED Final   Shigella/Enteroinvasive E coli (EIEC) NOT DETECTED NOT DETECTED Final   Cryptosporidium NOT DETECTED NOT DETECTED Final   Cyclospora cayetanensis NOT DETECTED NOT DETECTED Final   Entamoeba histolytica NOT DETECTED NOT DETECTED Final   Giardia lamblia NOT DETECTED NOT DETECTED Final   Adenovirus F40/41 NOT  DETECTED NOT DETECTED Final   Astrovirus NOT DETECTED NOT DETECTED Final   Norovirus GI/GII NOT DETECTED NOT DETECTED Final   Rotavirus A NOT DETECTED NOT DETECTED Final   Sapovirus (I, II, IV, and V) NOT DETECTED NOT DETECTED Final    Comment: Performed at [facility], [address] [place], [place] [address]         Radiology Studies: No results found.      Scheduled Meds: . amphetamine-dextroamphetamine  30 mg Oral Daily  . atorvastatin  20 mg Oral Daily  . B-complex with vitamin C  1 tablet Oral Daily  . cyclobenzaprine  5 mg Oral TID  . dicyclomine  20 mg Oral TID AC  . diphenoxylate-atropine  2 tablet Oral BID  . DULoxetine  60 mg  Oral Daily  . enoxaparin (LOVENOX) injection  40 mg Subcutaneous Q24H  . folic acid  1 mg Oral Daily  . lipase/protease/amylase  24,000 Units Oral TID AC  . octreotide  100 mcg Subcutaneous Q8H  . pantoprazole  40 mg Oral BID AC  . sodium chloride flush  3 mL Intravenous Q12H  . valACYclovir  1,000 mg Oral Q12H  . cyanocobalamin  1,000 mcg Oral Daily   Continuous Infusions: . sodium chloride 125 mL/hr at 10/20/19 0931     LOS: 5 days    Time spent: 35 minutes    Sidney Ace, MD [facility] Pager [phone]  If 7PM-7AM, please contact night-coverage 10/20/2019, 2:45 PM

## 2019-10-21 ENCOUNTER — Inpatient Hospital Stay: Payer: PRIVATE HEALTH INSURANCE

## 2019-10-21 LAB — BASIC METABOLIC PANEL
Anion gap: 5 (ref 5–15)
BUN: 9 mg/dL (ref 6–20)
CO2: 23 mmol/L (ref 22–32)
Calcium: 8.4 mg/dL — ABNORMAL LOW (ref 8.9–10.3)
Chloride: 111 mmol/L (ref 98–111)
Creatinine, Ser: 1.14 mg/dL — ABNORMAL HIGH (ref 0.44–1.00)
GFR calc Af Amer: >60 mL/min (ref >60)
GFR calc non Af Amer: >60 mL/min (ref >60)
Glucose, Bld: 91 mg/dL (ref 70–99)
Potassium: 4.5 mmol/L (ref 3.5–5.1)
Sodium: 139 mmol/L (ref 135–145)

## 2019-10-21 LAB — MAGNESIUM: Magnesium: 1.9 mg/dL (ref 1.7–2.4)

## 2019-10-21 MED ORDER — HYDRALAZINE HCL 20 MG/ML IJ SOLN
5.0000 mg | Freq: Four times a day (QID) | INTRAMUSCULAR | Status: DC | PRN
  Administered 2019-10-21: 5 mg via INTRAVENOUS
  Filled 2019-10-21: qty 1

## 2019-10-21 MED ORDER — HYDRALAZINE HCL 20 MG/ML IJ SOLN
10.0000 mg | INTRAMUSCULAR | Status: DC | PRN
  Administered 2019-10-21 – 2019-10-25 (×3): 10 mg via INTRAVENOUS
  Filled 2019-10-21 (×4): qty 1

## 2019-10-21 MED ORDER — HYDRALAZINE HCL 20 MG/ML IJ SOLN
5.0000 mg | INTRAMUSCULAR | Status: AC
  Administered 2019-10-21: 5 mg via INTRAVENOUS

## 2019-10-21 MED ORDER — HYDRALAZINE HCL 20 MG/ML IJ SOLN: 10.0000 mg | Freq: Four times a day (QID) | INTRAMUSCULAR | Status: DC | PRN

## 2019-10-21 NOTE — Progress Notes (Signed)
Patient still having the blurred vision and struggling to concentrate and make her thoughts clear.  Her BP was still elevated even after the Hydralazine.  I notifiied the MD of this and he increased the BP meds but also ordered a CT of the head as well.  Continue to monitor.

## 2019-10-21 NOTE — Progress Notes (Signed)
PROGRESS NOTE    Molly Cisneros  ZHY:[id number] DOB: 1985/08/24 DOA: 10/15/2019 PCP: Glean Hess, MD   Brief Narrative:  Molly E Jonesis a 34 y.o.Caucasian femalewith medical history significant forhypertension, dyslipidemia and anxiety who presents to the emergency room after she had 2 syncopal episodes at home. Patient states that she has been having loose watery stools since April, 2020and usually has bouts where it gets worse with increased frequencyresulting in dehydration requiring hospitalization. Per patient frequency of diarrhea increased over the last 3 days and has been associated with nausea and vomitingas well as abdominal pain mostly in the periumbilical area.  6/2: Patient seen and examined.  Frequency of diarrhea has improved somewhat but still persistent.  Kidney function improving.  Followed by nephrology.  6/3: Patient seen and examined.  Still with several episodes of loose stools.  Potassium slightly increased to 5.3 this morning.  Patient feels better overall.  6/4: Patient seen and examined.  Does not feel well this morning.  Unable to specify exactly why.  Little bit more nausea and pain the yesterday.  Still loose stools.  Blood pressure remains somewhat elevated.   Assessment & Plan:   Active Problems:   Obesity (BMI 30-39.9)   Essential hypertension   Chronic diarrhea of unknown origin   Hypokalemia   Hypomagnesemia   Hyponatremia   Dehydration   AKI (acute kidney injury) ([place])   Depression with anxiety   Abdominal pain   Lactic acidosis  Acute on Chronic diarrhea and abdominal pain Etiology unclear Patient has had extensive work-up in the past with [facility].  Ruled out inflammatory bowel diseases, Celiac. --C diff and GI path both neg on presentation PLAN: --continue MIVF@125  --Lomotil BID --home Bentyl and octreotide as scheduled --Trial Creon scheduled TID --Norco PRN for pain --Follow-up with GI as an outpatient  Sepsis, ruled out --  hypotension, tachycardia, leukocytosis due to dehydration and hemoconcentration, all improved after IVF. --C diff and GI path both neg  Acute kidney injury, POA, improved --Cr 2.03 on presentation.  Baseline ~1.2.  Multifactorial and secondary to ATN from hypotension related to volume depletion from diarrhea as well as concomitant use of diuretic therapy. --Creatinine slowly improving --Followed by nephrology PLAN: --continue MIVF@125  --Hold home hydrochlorothiazide/Lisinopril  Hypokalemia, resolved Hypomag Most likely related to GI losses from diarrhea as well as from diuretic use Supplement PO potassium and IV mag PRN  Hyponatremia, resolved Most likely hypovolemic Expect improvement in sodium levels with IV fluid resuscitation  Anxiety Continue home Xanax  Hypertension Likely exacerbated by acute illness Hydralazine 5 mg every 6 hours IV as needed   DVT prophylaxis: Lovenox Code Status: Full Family Communication: Family member at bedside Disposition Plan: Status is: Inpatient  Remains inpatient appropriate because:Inpatient level of care appropriate due to severity of illness   Dispo: The patient is from: Home              Anticipated d/c is to: Home              Anticipated d/c date is: 2 days              Patient currently is not medically stable to d/c.  Still with persistent diarrhea.  Kidney function improving but not quite at baseline.  Remains on IV fluids and aggressive antidiarrheal regimen.   Consultants:   Nephrology  Procedures:   none  Antimicrobials:   none    Subjective: Seen and examined.  No acute events overnight.  Continues to  have some diarrhea.  Does not feel well today  Objective: Vitals:   10/20/19 1926 10/21/19 0504 10/21/19 1200 10/21/19 1201  BP: (!) 133/99 (!) 132/97 (!) 131/101 (!) 142/108  Pulse: 92 82 95 91  Resp: 16 16 15    Temp: 98.6 F (37 C) 97.8 F (36.6 C) 98.1 F (36.7 C)   TempSrc: Oral Oral Oral    SpO2: 97% 99% 100% 100%  Weight:      Height:        Intake/Output Summary (Last 24 hours) at 10/21/2019 1340 Last data filed at 10/21/2019 1009 Gross per 24 hour  Intake 2627.71 ml  Output 1700 ml  Net 927.71 ml   Filed Weights   10/15/19 0638 10/15/19 2104  Weight: 97.7 kg 101 kg    Examination:  General exam: Appears calm and comfortable  Respiratory system: Clear to auscultation. Respiratory effort normal. Cardiovascular system: S1 & S2 heard, RRR. No JVD, murmurs, rubs, gallops or clicks. No pedal edema. Gastrointestinal system: Abdomen is nondistended, soft and nontender. No organomegaly or masses felt. Normal bowel sounds heard. Central nervous system: Alert and oriented. No focal neurological deficits. Extremities: Symmetric 5 x 5 power. Skin: No rashes, lesions or ulcers Psychiatry: Judgement and insight appear normal. Mood & affect appropriate.     Data Reviewed: I have personally reviewed following labs and imaging studies  CBC: Recent Labs  Lab 10/15/19 0645 10/16/19 0609 10/17/19 0459 10/18/19 0654 10/19/19 0517  WBC 16.0* 10.3 10.6* 9.1 9.8  HGB 13.5 12.9 11.5* 10.1* 10.2*  HCT 39.9 38.3 33.6* 30.1* 30.0*  MCV 85.1 85.5 85.5 85.3 84.7  PLT 495* 453* 385 345 387   Basic Metabolic Panel: Recent Labs  Lab 10/17/19 0459 10/18/19 0654 10/19/19 0517 10/20/19 0808 10/21/19 0517  NA 138 137 140 141 139  K 4.7 4.4 4.2 5.3* 4.5  CL 113* 111 115* 114* 111  CO2 20* 21* 21* 22 23  GLUCOSE 103* 93 93 115* 91  BUN 8 8 14 11 9   CREATININE 1.27* 1.36* 1.33* 1.11* 1.14*  CALCIUM 8.7* 8.2* 8.3* 8.7* 8.4*  MG 1.6* 1.6* 1.8 1.6* 1.9   GFR: Estimated Creatinine Clearance: 91.7 mL/min (A) (by C-G formula based on SCr of 1.14 mg/dL (H)). Liver Function Tests: Recent Labs  Lab 10/15/19 0645  AST 34  ALT 40  ALKPHOS 70  BILITOT 0.6  PROT 7.6  ALBUMIN 4.3   Recent Labs  Lab 10/15/19 0645  LIPASE 41   No results for input(s): AMMONIA in the last 168  hours. Coagulation Profile: No results for input(s): INR, PROTIME in the last 168 hours. Cardiac Enzymes: No results for input(s): CKTOTAL, CKMB, CKMBINDEX, TROPONINI in the last 168 hours. BNP (last 3 results) No results for input(s): PROBNP in the last 8760 hours. HbA1C: No results for input(s): HGBA1C in the last 72 hours. CBG: No results for input(s): GLUCAP in the last 168 hours. Lipid Profile: No results for input(s): CHOL, HDL, LDLCALC, TRIG, CHOLHDL, LDLDIRECT in the last 72 hours. Thyroid Function Tests: No results for input(s): TSH, T4TOTAL, FREET4, T3FREE, THYROIDAB in the last 72 hours. Anemia Panel: No results for input(s): VITAMINB12, FOLATE, FERRITIN, TIBC, IRON, RETICCTPCT in the last 72 hours. Sepsis Labs: Recent Labs  Lab 10/15/19 0645 10/15/19 0915 10/17/19 0943 10/18/19 1810  LATICACIDVEN 2.8* 2.1* 2.0* 1.4    Recent Results (from the past 240 hour(s))  SARS Coronavirus 2 by RT PCR (hospital order, performed in [facility] hospital lab) Nasopharyngeal Nasopharyngeal Swab  Status: None   Collection Time: 10/15/19  7:24 AM   Specimen: Nasopharyngeal Swab  Result Value Ref Range Status   SARS Coronavirus 2 NEGATIVE NEGATIVE Final    Comment: (NOTE) SARS-CoV-2 target nucleic acids are NOT DETECTED. The SARS-CoV-2 RNA is generally detectable in upper and lower respiratory specimens during the acute phase of infection. The lowest concentration of SARS-CoV-2 viral copies this assay can detect is 250 copies / mL. A negative result does not preclude SARS-CoV-2 infection and should not be used as the sole basis for treatment or other patient management decisions.  A negative result may occur with improper specimen collection / handling, submission of specimen other than nasopharyngeal swab, presence of viral mutation(s) within the areas targeted by this assay, and inadequate number of viral copies (<250 copies / mL). A negative result must be combined with  clinical observations, patient history, and epidemiological information. Fact Sheet for Patients:   StrictlyIdeas.no Fact Sheet for Healthcare Providers: BankingDealers.co.za This test is not yet approved or cleared  by the [place] FDA and has been authorized for detection and/or diagnosis of SARS-CoV-2 by FDA under an Emergency Use Authorization (EUA).  This EUA will remain in effect (meaning this test can be used) for the duration of the COVID-19 declaration under Section 564(b)(1) of the Act, 21 U.S.C. section 360bbb-3(b)(1), unless the authorization is terminated or revoked sooner. Performed at [facility], [place], [place] [address]   C Difficile Quick Screen w PCR reflex     Status: None   Collection Time: 10/15/19  9:15 AM   Specimen: Urine, Clean Catch; Stool  Result Value Ref Range Status   C Diff antigen NEGATIVE NEGATIVE Final   C Diff toxin NEGATIVE NEGATIVE Final   C Diff interpretation No C. difficile detected.  Final    Comment: Performed at [facility], [address] [place], [place] [address]  Gastrointestinal Panel by PCR , Stool     Status: None   Collection Time: 10/15/19  9:15 AM   Specimen: Urine, Clean Catch; Stool  Result Value Ref Range Status   Campylobacter species NOT DETECTED NOT DETECTED Final   Plesimonas shigelloides NOT DETECTED NOT DETECTED Final   Salmonella species NOT DETECTED NOT DETECTED Final   Yersinia enterocolitica NOT DETECTED NOT DETECTED Final   Vibrio species NOT DETECTED NOT DETECTED Final   Vibrio cholerae NOT DETECTED NOT DETECTED Final   Enteroaggregative E coli (EAEC) NOT DETECTED NOT DETECTED Final   Enteropathogenic E coli (EPEC) NOT DETECTED NOT DETECTED Final   Enterotoxigenic E coli (ETEC) NOT DETECTED NOT DETECTED Final   Shiga like toxin producing E coli (STEC) NOT DETECTED NOT DETECTED Final   Shigella/Enteroinvasive E coli  (EIEC) NOT DETECTED NOT DETECTED Final   Cryptosporidium NOT DETECTED NOT DETECTED Final   Cyclospora cayetanensis NOT DETECTED NOT DETECTED Final   Entamoeba histolytica NOT DETECTED NOT DETECTED Final   Giardia lamblia NOT DETECTED NOT DETECTED Final   Adenovirus F40/41 NOT DETECTED NOT DETECTED Final   Astrovirus NOT DETECTED NOT DETECTED Final   Norovirus GI/GII NOT DETECTED NOT DETECTED Final   Rotavirus A NOT DETECTED NOT DETECTED Final   Sapovirus (I, II, IV, and V) NOT DETECTED NOT DETECTED Final    Comment: Performed at [facility], [address] [place],  [address]         Radiology Studies: No results found.      Scheduled Meds: . amphetamine-dextroamphetamine  30 mg Oral  Daily  . atorvastatin  20 mg Oral Daily  . B-complex with vitamin C  1 tablet Oral Daily  . cyclobenzaprine  5 mg Oral TID  . dicyclomine  20 mg Oral TID AC  . diphenoxylate-atropine  2 tablet Oral BID  . DULoxetine  60 mg Oral Daily  . enoxaparin (LOVENOX) injection  40 mg Subcutaneous Q24H  . folic acid  1 mg Oral Daily  . lipase/protease/amylase  24,000 Units Oral TID AC  . octreotide  100 mcg Subcutaneous Q8H  . pantoprazole  40 mg Oral BID AC  . sodium chloride flush  3 mL Intravenous Q12H  . valACYclovir  1,000 mg Oral Q12H  . cyanocobalamin  1,000 mcg Oral Daily   Continuous Infusions: . sodium chloride 125 mL/hr at 10/21/19 0933     LOS: 6 days    Time spent: 35 minutes    Sidney Ace, MD [facility] Pager [phone]  If 7PM-7AM, please contact night-coverage 10/21/2019, 1:40 PM

## 2019-10-21 NOTE — Plan of Care (Signed)
Patient not feeling that great today.  A little more nausea and pain than yesterday.  Continue with plan of care.

## 2019-10-21 NOTE — Progress Notes (Signed)
Notified Dr. Priscella Mann that patient having a little blurred vision, BP is a little elevated so he put in order for IV BP meds.  Will administer and monitor.

## 2019-10-22 DIAGNOSIS — E860 Dehydration: Secondary | ICD-10-CM

## 2019-10-22 LAB — BASIC METABOLIC PANEL
Anion gap: 6 (ref 5–15)
BUN: 9 mg/dL (ref 6–20)
CO2: 23 mmol/L (ref 22–32)
Calcium: 8.5 mg/dL — ABNORMAL LOW (ref 8.9–10.3)
Chloride: 111 mmol/L (ref 98–111)
Creatinine, Ser: 1.02 mg/dL — ABNORMAL HIGH (ref 0.44–1.00)
GFR calc Af Amer: >60 mL/min (ref >60)
GFR calc non Af Amer: >60 mL/min (ref >60)
Glucose, Bld: 112 mg/dL — ABNORMAL HIGH (ref 70–99)
Potassium: 4.3 mmol/L (ref 3.5–5.1)
Sodium: 140 mmol/L (ref 135–145)

## 2019-10-22 LAB — MAGNESIUM: Magnesium: 1.5 mg/dL — ABNORMAL LOW (ref 1.7–2.4)

## 2019-10-22 MED ORDER — KETOROLAC TROMETHAMINE 15 MG/ML IJ SOLN
15.0000 mg | Freq: Three times a day (TID) | INTRAMUSCULAR | Status: AC | PRN
  Administered 2019-10-22 – 2019-10-25 (×6): 15 mg via INTRAVENOUS
  Filled 2019-10-22 (×6): qty 1

## 2019-10-22 MED ORDER — KETOROLAC TROMETHAMINE 15 MG/ML IJ SOLN: 15.0000 mg | Freq: Four times a day (QID) | INTRAMUSCULAR | Status: DC | PRN

## 2019-10-22 MED ORDER — KETOROLAC TROMETHAMINE 30 MG/ML IJ SOLN
30.0000 mg | Freq: Once | INTRAMUSCULAR | Status: AC
  Administered 2019-10-22: 30 mg via INTRAVENOUS
  Filled 2019-10-22: qty 1

## 2019-10-22 MED ORDER — AMLODIPINE BESYLATE 5 MG PO TABS
5.0000 mg | ORAL_TABLET | Freq: Every day | ORAL | Status: DC
  Administered 2019-10-22 – 2019-10-24 (×3): 5 mg via ORAL
  Filled 2019-10-22 (×3): qty 1

## 2019-10-22 NOTE — Progress Notes (Signed)
PROGRESS NOTE    Molly Cisneros  JOI:[id number] DOB: January 17, 1986 DOA: 10/15/2019 PCP: Glean Hess, MD   Brief Narrative:  Molly E Jonesis a 34 y.o.Caucasian femalewith medical history significant forhypertension, dyslipidemia and anxiety who presents to the emergency room after she had 2 syncopal episodes at home. Patient states that she has been having loose watery stools since April, 2020and usually has bouts where it gets worse with increased frequencyresulting in dehydration requiring hospitalization. Per patient frequency of diarrhea increased over the last 3 days and has been associated with nausea and vomitingas well as abdominal pain mostly in the periumbilical area.  6/2: Patient seen and examined.  Frequency of diarrhea has improved somewhat but still persistent.  Kidney function improving.  Followed by nephrology.  6/3: Patient seen and examined.  Still with several episodes of loose stools.  Potassium slightly increased to 5.3 this morning.  Patient feels better overall.  6/4: Patient seen and examined.  Does not feel well this morning.  Unable to specify exactly why.  Little bit more nausea and pain the yesterday.  Still loose stools.  Blood pressure remains somewhat elevated.  6/5: Patient seen and examined.  Still not quite feeling well.  Blurred vision persistent but only present when looking at back lit screens like iPad and iPhone.  Still with some loose stools.  Kidney function improving.  Blood pressure elevated.   Assessment & Plan:   Active Problems:   Obesity (BMI 30-39.9)   Essential hypertension   Chronic diarrhea of unknown origin   Hypokalemia   Hypomagnesemia   Hyponatremia   Dehydration   AKI (acute kidney injury) ([place])   Depression with anxiety   Abdominal pain   Lactic acidosis  Acute on Chronic diarrhea and abdominal pain Etiology unclear Patient has had extensive work-up in the past with [facility].  Ruled out inflammatory bowel diseases,  Celiac. --C diff and GI path both neg on presentation PLAN: --continue MIVF@125  --Lomotil BID --home Bentyl and octreotide as scheduled --Trial Creon scheduled TID --Norco PRN for pain --Follow-up with GI as an outpatient  Sepsis, ruled out -- hypotension, tachycardia, leukocytosis due to dehydration and hemoconcentration, all improved after IVF. --C diff and GI path both neg  Acute kidney injury, POA, improved --Cr 2.03 on presentation.  Baseline ~1.2.  Multifactorial and secondary to ATN from hypotension related to volume depletion from diarrhea as well as concomitant use of diuretic therapy. --Creatinine slowly improving --Followed by nephrology PLAN: --continue MIVF@125  --Hold home hydrochlorothiazide/Lisinopril  Hypokalemia, resolved Hypomag Most likely related to GI losses from diarrhea as well as from diuretic use Supplement PO potassium and IV mag PRN  Hyponatremia, resolved Most likely hypovolemic Expect improvement in sodium levels with IV fluid resuscitation  Anxiety Continue home Xanax  Hypertension Likely exacerbated by acute illness Hydralazine 5 mg every 6 hours IV as needed Amlodipine 5mg  qD   DVT prophylaxis: Lovenox Code Status: Full Family Communication: Family member at bedside Disposition Plan: Status is: Inpatient  Remains inpatient appropriate because:Inpatient level of care appropriate due to severity of illness   Dispo: The patient is from: Home              Anticipated d/c is to: Home              Anticipated d/c date is: 2 days              Patient currently is not medically stable to d/c.  Still with persistent diarrhea.  Kidney function  improving but not quite at baseline.  Remains on IV fluids and aggressive antidiarrheal regimen.   Consultants:   Nephrology  Procedures:   none  Antimicrobials:   none    Subjective: Seen and examined.  No acute events overnight.  Continues to have some diarrhea.  Does not feel  well today  Objective: Vitals:   10/21/19 2039 10/21/19 2300 10/22/19 0521 10/22/19 1302  BP: (!) 146/101 (!) 148/95 (!) 149/103 (!) 137/95  Pulse: (!) 103  92 95  Resp:    16  Temp:   98 F (36.7 C) 98.1 F (36.7 C)  TempSrc:   Oral   SpO2: 100%  100% 100%  Weight:      Height:        Intake/Output Summary (Last 24 hours) at 10/22/2019 1307 Last data filed at 10/22/2019 1300 Gross per 24 hour  Intake 2682.02 ml  Output 7500 ml  Net -4817.98 ml   Filed Weights   10/15/19 0638 10/15/19 2104  Weight: 97.7 kg 101 kg    Examination:  General exam: Appears calm and comfortable  Respiratory system: Clear to auscultation. Respiratory effort normal. Cardiovascular system: S1 & S2 heard, RRR. No JVD, murmurs, rubs, gallops or clicks. No pedal edema. Gastrointestinal system: Abdomen is nondistended, soft and nontender. No organomegaly or masses felt. Normal bowel sounds heard. Central nervous system: Alert and oriented. No focal neurological deficits. Extremities: Symmetric 5 x 5 power. Skin: No rashes, lesions or ulcers Psychiatry: Judgement and insight appear normal. Mood & affect appropriate.     Data Reviewed: I have personally reviewed following labs and imaging studies  CBC: Recent Labs  Lab 10/16/19 0609 10/17/19 0459 10/18/19 0654 10/19/19 0517  WBC 10.3 10.6* 9.1 9.8  HGB 12.9 11.5* 10.1* 10.2*  HCT 38.3 33.6* 30.1* 30.0*  MCV 85.5 85.5 85.3 84.7  PLT 453* 385 345 563   Basic Metabolic Panel: Recent Labs  Lab 10/18/19 0654 10/19/19 0517 10/20/19 0808 10/21/19 0517 10/22/19 0553  NA 137 140 141 139 140  K 4.4 4.2 5.3* 4.5 4.3  CL 111 115* 114* 111 111  CO2 21* 21* 22 23 23   GLUCOSE 93 93 115* 91 112*  BUN 8 14 11 9 9   CREATININE 1.36* 1.33* 1.11* 1.14* 1.02*  CALCIUM 8.2* 8.3* 8.7* 8.4* 8.5*  MG 1.6* 1.8 1.6* 1.9 1.5*   GFR: Estimated Creatinine Clearance: 102.4 mL/min (A) (by C-G formula based on SCr of 1.02 mg/dL (H)). Liver Function Tests: No  results for input(s): AST, ALT, ALKPHOS, BILITOT, PROT, ALBUMIN in the last 168 hours. No results for input(s): LIPASE, AMYLASE in the last 168 hours. No results for input(s): AMMONIA in the last 168 hours. Coagulation Profile: No results for input(s): INR, PROTIME in the last 168 hours. Cardiac Enzymes: No results for input(s): CKTOTAL, CKMB, CKMBINDEX, TROPONINI in the last 168 hours. BNP (last 3 results) No results for input(s): PROBNP in the last 8760 hours. HbA1C: No results for input(s): HGBA1C in the last 72 hours. CBG: No results for input(s): GLUCAP in the last 168 hours. Lipid Profile: No results for input(s): CHOL, HDL, LDLCALC, TRIG, CHOLHDL, LDLDIRECT in the last 72 hours. Thyroid Function Tests: No results for input(s): TSH, T4TOTAL, FREET4, T3FREE, THYROIDAB in the last 72 hours. Anemia Panel: No results for input(s): VITAMINB12, FOLATE, FERRITIN, TIBC, IRON, RETICCTPCT in the last 72 hours. Sepsis Labs: Recent Labs  Lab 10/17/19 0943 10/18/19 1810  LATICACIDVEN 2.0* 1.4    Recent Results (from the past  240 hour(s))  SARS Coronavirus 2 by RT PCR (hospital order, performed in [facility] hospital lab) Nasopharyngeal Nasopharyngeal Swab     Status: None   Collection Time: 10/15/19  7:24 AM   Specimen: Nasopharyngeal Swab  Result Value Ref Range Status   SARS Coronavirus 2 NEGATIVE NEGATIVE Final    Comment: (NOTE) SARS-CoV-2 target nucleic acids are NOT DETECTED. The SARS-CoV-2 RNA is generally detectable in upper and lower respiratory specimens during the acute phase of infection. The lowest concentration of SARS-CoV-2 viral copies this assay can detect is 250 copies / mL. A negative result does not preclude SARS-CoV-2 infection and should not be used as the sole basis for treatment or other patient management decisions.  A negative result may occur with improper specimen collection / handling, submission of specimen other than nasopharyngeal swab, presence of  viral mutation(s) within the areas targeted by this assay, and inadequate number of viral copies (<250 copies / mL). A negative result must be combined with clinical observations, patient history, and epidemiological information. Fact Sheet for Patients:   StrictlyIdeas.no Fact Sheet for Healthcare Providers: BankingDealers.co.za This test is not yet approved or cleared  by the [place] FDA and has been authorized for detection and/or diagnosis of SARS-CoV-2 by FDA under an Emergency Use Authorization (EUA).  This EUA will remain in effect (meaning this test can be used) for the duration of the COVID-19 declaration under Section 564(b)(1) of the Act, 21 U.S.C. section 360bbb-3(b)(1), unless the authorization is terminated or revoked sooner. Performed at [facility], [place], [place] [address]   C Difficile Quick Screen w PCR reflex     Status: None   Collection Time: 10/15/19  9:15 AM   Specimen: Urine, Clean Catch; Stool  Result Value Ref Range Status   C Diff antigen NEGATIVE NEGATIVE Final   C Diff toxin NEGATIVE NEGATIVE Final   C Diff interpretation No C. difficile detected.  Final    Comment: Performed at [facility], [address] [place], [place] [address]  Gastrointestinal Panel by PCR , Stool     Status: None   Collection Time: 10/15/19  9:15 AM   Specimen: Urine, Clean Catch; Stool  Result Value Ref Range Status   Campylobacter species NOT DETECTED NOT DETECTED Final   Plesimonas shigelloides NOT DETECTED NOT DETECTED Final   Salmonella species NOT DETECTED NOT DETECTED Final   Yersinia enterocolitica NOT DETECTED NOT DETECTED Final   Vibrio species NOT DETECTED NOT DETECTED Final   Vibrio cholerae NOT DETECTED NOT DETECTED Final   Enteroaggregative E coli (EAEC) NOT DETECTED NOT DETECTED Final   Enteropathogenic E coli (EPEC) NOT DETECTED NOT DETECTED Final   Enterotoxigenic E  coli (ETEC) NOT DETECTED NOT DETECTED Final   Shiga like toxin producing E coli (STEC) NOT DETECTED NOT DETECTED Final   Shigella/Enteroinvasive E coli (EIEC) NOT DETECTED NOT DETECTED Final   Cryptosporidium NOT DETECTED NOT DETECTED Final   Cyclospora cayetanensis NOT DETECTED NOT DETECTED Final   Entamoeba histolytica NOT DETECTED NOT DETECTED Final   Giardia lamblia NOT DETECTED NOT DETECTED Final   Adenovirus F40/41 NOT DETECTED NOT DETECTED Final   Astrovirus NOT DETECTED NOT DETECTED Final   Norovirus GI/GII NOT DETECTED NOT DETECTED Final   Rotavirus A NOT DETECTED NOT DETECTED Final   Sapovirus (I, II, IV, and V) NOT DETECTED NOT DETECTED Final    Comment: Performed at [facility], [address], [place], [place] [address]  Radiology Studies: CT HEAD WO CONTRAST  Result Date: 10/21/2019 CLINICAL DATA:  Hypertension, dyslipidemia, anxiety, syncope EXAM: CT HEAD WITHOUT CONTRAST TECHNIQUE: Contiguous axial images were obtained from the base of the skull through the vertex without intravenous contrast. COMPARISON:  07/18/2018 FINDINGS: Brain: No acute infarct or hemorrhage. Lateral ventricles and midline structures are unremarkable. No acute extra-axial fluid collections. No mass effect. Vascular: No hyperdense vessel or unexpected calcification. Skull: Normal. Negative for fracture or focal lesion. Sinuses/Orbits: No acute finding. Other: None. IMPRESSION: 1. Stable head CT, no acute process. Electronically Signed   By: Randa Ngo M.D.   On: 10/21/2019 18:52        Scheduled Meds: . amLODipine  5 mg Oral Daily  . amphetamine-dextroamphetamine  30 mg Oral Daily  . atorvastatin  20 mg Oral Daily  . B-complex with vitamin C  1 tablet Oral Daily  . cyclobenzaprine  5 mg Oral TID  . dicyclomine  20 mg Oral TID AC  . diphenoxylate-atropine  2 tablet Oral BID  . DULoxetine  60 mg Oral Daily  . enoxaparin (LOVENOX) injection  40 mg Subcutaneous Q24H  .  folic acid  1 mg Oral Daily  . lipase/protease/amylase  24,000 Units Oral TID AC  . octreotide  100 mcg Subcutaneous Q8H  . pantoprazole  40 mg Oral BID AC  . sodium chloride flush  3 mL Intravenous Q12H  . valACYclovir  1,000 mg Oral Q12H  . cyanocobalamin  1,000 mcg Oral Daily   Continuous Infusions: . sodium chloride 125 mL/hr at 10/22/19 0927     LOS: 7 days    Time spent: 35 minutes    Sidney Ace, MD [facility] Pager [phone]  If 7PM-7AM, please contact night-coverage 10/22/2019, 1:07 PM

## 2019-10-22 NOTE — Plan of Care (Signed)
Patient doing better today.  She had headache this morning which was relieved with medications.  Vision is still a bit blurred when reading her phone.  She is ambulating and voiding well.  Continue to monitor.

## 2019-10-23 LAB — BASIC METABOLIC PANEL
Anion gap: 6 (ref 5–15)
BUN: 8 mg/dL (ref 6–20)
CO2: 24 mmol/L (ref 22–32)
Calcium: 8.6 mg/dL — ABNORMAL LOW (ref 8.9–10.3)
Chloride: 111 mmol/L (ref 98–111)
Creatinine, Ser: 1.06 mg/dL — ABNORMAL HIGH (ref 0.44–1.00)
GFR calc Af Amer: >60 mL/min (ref >60)
GFR calc non Af Amer: >60 mL/min (ref >60)
Glucose, Bld: 107 mg/dL — ABNORMAL HIGH (ref 70–99)
Potassium: 4.4 mmol/L (ref 3.5–5.1)
Sodium: 141 mmol/L (ref 135–145)

## 2019-10-23 LAB — MAGNESIUM: Magnesium: 1.7 mg/dL (ref 1.7–2.4)

## 2019-10-23 MED ORDER — VALACYCLOVIR HCL 500 MG PO TABS
1000.0000 mg | ORAL_TABLET | Freq: Three times a day (TID) | ORAL | Status: DC
  Administered 2019-10-23 – 2019-10-28 (×16): 1000 mg via ORAL
  Filled 2019-10-23 (×18): qty 2

## 2019-10-23 NOTE — Progress Notes (Signed)
PROGRESS NOTE    Molly Cisneros  WUJ:[id number] DOB: 11-27-1985 DOA: 10/15/2019 PCP: Glean Hess, MD   Brief Narrative:  Molly E Jonesis a 34 y.o.Caucasian femalewith medical history significant forhypertension, dyslipidemia and anxiety who presents to the emergency room after she had 2 syncopal episodes at home. Patient states that she has been having loose watery stools since April, 2020and usually has bouts where it gets worse with increased frequencyresulting in dehydration requiring hospitalization. Per patient frequency of diarrhea increased over the last 3 days and has been associated with nausea and vomitingas well as abdominal pain mostly in the periumbilical area.  6/2: Patient seen and examined.  Frequency of diarrhea has improved somewhat but still persistent.  Kidney function improving.  Followed by nephrology.  6/3: Patient seen and examined.  Still with several episodes of loose stools.  Potassium slightly increased to 5.3 this morning.  Patient feels better overall.  6/4: Patient seen and examined.  Does not feel well this morning.  Unable to specify exactly why.  Little bit more nausea and pain the yesterday.  Still loose stools.  Blood pressure remains somewhat elevated.  6/5: Patient seen and examined.  Still not quite feeling well.  Blurred vision persistent but only present when looking at back lit screens like iPad and iPhone.  Still with some loose stools.  Kidney function improving.  Blood pressure elevated.  6/6: Patient seen and examined.  Remains hemodynamically stable however not feeling well.  Concerned about headache and potential shingles recurrence.  Still with blurred vision when looking at phone or iPad.   Assessment & Plan:   Active Problems:   Obesity (BMI 30-39.9)   Essential hypertension   Chronic diarrhea of unknown origin   Hypokalemia   Hypomagnesemia   Hyponatremia   Dehydration   AKI (acute kidney injury) ([place])   Depression with  anxiety   Abdominal pain   Lactic acidosis  Acute on Chronic diarrhea and abdominal pain Etiology unclear Patient has had extensive work-up in the past with [facility].  Ruled out inflammatory bowel diseases, Celiac. --C diff and GI path both neg on presentation PLAN: --continue MIVF@125  --Lomotil BID --home Bentyl and octreotide as scheduled --Trial Creon scheduled TID --Norco PRN for pain --Follow-up with GI as an outpatient  Sepsis, ruled out -- hypotension, tachycardia, leukocytosis due to dehydration and hemoconcentration, all improved after IVF. --C diff and GI path both neg  Acute kidney injury, POA, improved --Cr 2.03 on presentation.  Baseline ~1.2.  Multifactorial and secondary to ATN from hypotension related to volume depletion from diarrhea as well as concomitant use of diuretic therapy. --Creatinine slowly improving --Followed by nephrology PLAN: DC maintenance IV fluids --Hold home hydrochlorothiazide/Lisinopril  Hypokalemia, resolved Hypomag Most likely related to GI losses from diarrhea as well as from diuretic use Supplement PO potassium and IV mag PRN  Hyponatremia, resolved Most likely hypovolemic Expect improvement in sodium levels with IV fluid resuscitation  Anxiety Continue home Xanax  Hypertension Likely exacerbated by acute illness Hydralazine 5 mg every 6 hours IV as needed Amlodipine 5mg  qD  History of shingles Was previous on Valtrex however had refused a few doses due to perceived resolution No obvious shingles skin findings Given complaints of blurred vision there is concern for zoster ophthalmicus however clinical exam is inconsistent Plan: Valacyclovir 1000 mg 3 times daily x7 days Consider MRI if continued concern for ocular involvement   DVT prophylaxis: Lovenox Code Status: Full Family Communication: Family member at bedside 6/5 Disposition  Plan: Status is: Inpatient  Remains inpatient appropriate because:Inpatient level of  care appropriate due to severity of illness   Dispo: The patient is from: Home              Anticipated d/c is to: Home              Anticipated d/c date is: 2 days              Patient currently is not medically stable to d/c.  Still with persistent diarrhea.  Kidney function improving.  Tentative plan to discharge within 24 to 48 hours.  Consultants:   Nephrology  Procedures:   none  Antimicrobials:   none    Subjective: Seen and examined.  No acute events overnight.  Continues to have some diarrhea.  Does not feel well today  Objective: Vitals:   10/22/19 1302 10/22/19 2015 10/23/19 0441 10/23/19 0449  BP: (!) 137/95 (!) 134/96 (!) 143/105 (!) 136/93  Pulse: 95 (!) 102 100 96  Resp: 16 20 (!) 21 19  Temp: 98.1 F (36.7 C) 98.7 F (37.1 C) 98.5 F (36.9 C)   TempSrc:  Oral Oral   SpO2: 100% 99% 95% 98%  Weight:      Height:        Intake/Output Summary (Last 24 hours) at 10/23/2019 1133 Last data filed at 10/23/2019 1040 Gross per 24 hour  Intake 2813.05 ml  Output 4000 ml  Net -1186.95 ml   Filed Weights   10/15/19 0638 10/15/19 2104  Weight: 97.7 kg 101 kg    Examination:  General exam: Appears calm and comfortable  Respiratory system: Clear to auscultation. Respiratory effort normal. Cardiovascular system: S1 & S2 heard, RRR. No JVD, murmurs, rubs, gallops or clicks. No pedal edema. Gastrointestinal system: Abdomen is nondistended, soft and nontender. No organomegaly or masses felt. Normal bowel sounds heard. Central nervous system: Alert and oriented. No focal neurological deficits. Extremities: Symmetric 5 x 5 power. Skin: No rashes, lesions or ulcers Psychiatry: Judgement and insight appear normal. Mood & affect appropriate.     Data Reviewed: I have personally reviewed following labs and imaging studies  CBC: Recent Labs  Lab 10/17/19 0459 10/18/19 0654 10/19/19 0517  WBC 10.6* 9.1 9.8  HGB 11.5* 10.1* 10.2*  HCT 33.6* 30.1* 30.0*   MCV 85.5 85.3 84.7  PLT 385 345 956   Basic Metabolic Panel: Recent Labs  Lab 10/19/19 0517 10/20/19 0808 10/21/19 0517 10/22/19 0553 10/23/19 0625  NA 140 141 139 140 141  K 4.2 5.3* 4.5 4.3 4.4  CL 115* 114* 111 111 111  CO2 21* 22 23 23 24   GLUCOSE 93 115* 91 112* 107*  BUN 14 11 9 9 8   CREATININE 1.33* 1.11* 1.14* 1.02* 1.06*  CALCIUM 8.3* 8.7* 8.4* 8.5* 8.6*  MG 1.8 1.6* 1.9 1.5* 1.7   GFR: Estimated Creatinine Clearance: 98.6 mL/min (A) (by C-G formula based on SCr of 1.06 mg/dL (H)). Liver Function Tests: No results for input(s): AST, ALT, ALKPHOS, BILITOT, PROT, ALBUMIN in the last 168 hours. No results for input(s): LIPASE, AMYLASE in the last 168 hours. No results for input(s): AMMONIA in the last 168 hours. Coagulation Profile: No results for input(s): INR, PROTIME in the last 168 hours. Cardiac Enzymes: No results for input(s): CKTOTAL, CKMB, CKMBINDEX, TROPONINI in the last 168 hours. BNP (last 3 results) No results for input(s): PROBNP in the last 8760 hours. HbA1C: No results for input(s): HGBA1C in the last 72 hours.  CBG: No results for input(s): GLUCAP in the last 168 hours. Lipid Profile: No results for input(s): CHOL, HDL, LDLCALC, TRIG, CHOLHDL, LDLDIRECT in the last 72 hours. Thyroid Function Tests: No results for input(s): TSH, T4TOTAL, FREET4, T3FREE, THYROIDAB in the last 72 hours. Anemia Panel: No results for input(s): VITAMINB12, FOLATE, FERRITIN, TIBC, IRON, RETICCTPCT in the last 72 hours. Sepsis Labs: Recent Labs  Lab 10/17/19 0943 10/18/19 1810  LATICACIDVEN 2.0* 1.4    Recent Results (from the past 240 hour(s))  SARS Coronavirus 2 by RT PCR (hospital order, performed in [facility] hospital lab) Nasopharyngeal Nasopharyngeal Swab     Status: None   Collection Time: 10/15/19  7:24 AM   Specimen: Nasopharyngeal Swab  Result Value Ref Range Status   SARS Coronavirus 2 NEGATIVE NEGATIVE Final    Comment: (NOTE) SARS-CoV-2 target  nucleic acids are NOT DETECTED. The SARS-CoV-2 RNA is generally detectable in upper and lower respiratory specimens during the acute phase of infection. The lowest concentration of SARS-CoV-2 viral copies this assay can detect is 250 copies / mL. A negative result does not preclude SARS-CoV-2 infection and should not be used as the sole basis for treatment or other patient management decisions.  A negative result may occur with improper specimen collection / handling, submission of specimen other than nasopharyngeal swab, presence of viral mutation(s) within the areas targeted by this assay, and inadequate number of viral copies (<250 copies / mL). A negative result must be combined with clinical observations, patient history, and epidemiological information. Fact Sheet for Patients:   StrictlyIdeas.no Fact Sheet for Healthcare Providers: BankingDealers.co.za This test is not yet approved or cleared  by the [place] FDA and has been authorized for detection and/or diagnosis of SARS-CoV-2 by FDA under an Emergency Use Authorization (EUA).  This EUA will remain in effect (meaning this test can be used) for the duration of the COVID-19 declaration under Section 564(b)(1) of the Act, 21 U.S.C. section 360bbb-3(b)(1), unless the authorization is terminated or revoked sooner. Performed at [facility], [place], [place] [address]   C Difficile Quick Screen w PCR reflex     Status: None   Collection Time: 10/15/19  9:15 AM   Specimen: Urine, Clean Catch; Stool  Result Value Ref Range Status   C Diff antigen NEGATIVE NEGATIVE Final   C Diff toxin NEGATIVE NEGATIVE Final   C Diff interpretation No C. difficile detected.  Final    Comment: Performed at [facility], [address] [place], [place] [address]  Gastrointestinal Panel by PCR , Stool     Status: None   Collection Time: 10/15/19  9:15 AM    Specimen: Urine, Clean Catch; Stool  Result Value Ref Range Status   Campylobacter species NOT DETECTED NOT DETECTED Final   Plesimonas shigelloides NOT DETECTED NOT DETECTED Final   Salmonella species NOT DETECTED NOT DETECTED Final   Yersinia enterocolitica NOT DETECTED NOT DETECTED Final   Vibrio species NOT DETECTED NOT DETECTED Final   Vibrio cholerae NOT DETECTED NOT DETECTED Final   Enteroaggregative E coli (EAEC) NOT DETECTED NOT DETECTED Final   Enteropathogenic E coli (EPEC) NOT DETECTED NOT DETECTED Final   Enterotoxigenic E coli (ETEC) NOT DETECTED NOT DETECTED Final   Shiga like toxin producing E coli (STEC) NOT DETECTED NOT DETECTED Final   Shigella/Enteroinvasive E coli (EIEC) NOT DETECTED NOT DETECTED Final   Cryptosporidium NOT DETECTED NOT DETECTED Final   Cyclospora cayetanensis NOT DETECTED NOT DETECTED Final  Entamoeba histolytica NOT DETECTED NOT DETECTED Final   Giardia lamblia NOT DETECTED NOT DETECTED Final   Adenovirus F40/41 NOT DETECTED NOT DETECTED Final   Astrovirus NOT DETECTED NOT DETECTED Final   Norovirus GI/GII NOT DETECTED NOT DETECTED Final   Rotavirus A NOT DETECTED NOT DETECTED Final   Sapovirus (I, II, IV, and V) NOT DETECTED NOT DETECTED Final    Comment: Performed at [facility], [address] [place], [place] [address]         Radiology Studies: CT HEAD WO CONTRAST  Result Date: 10/21/2019 CLINICAL DATA:  Hypertension, dyslipidemia, anxiety, syncope EXAM: CT HEAD WITHOUT CONTRAST TECHNIQUE: Contiguous axial images were obtained from the base of the skull through the vertex without intravenous contrast. COMPARISON:  07/18/2018 FINDINGS: Brain: No acute infarct or hemorrhage. Lateral ventricles and midline structures are unremarkable. No acute extra-axial fluid collections. No mass effect. Vascular: No hyperdense vessel or unexpected calcification. Skull: Normal. Negative for fracture or focal lesion. Sinuses/Orbits: No acute  finding. Other: None. IMPRESSION: 1. Stable head CT, no acute process. Electronically Signed   By: Randa Ngo M.D.   On: 10/21/2019 18:52        Scheduled Meds: . amLODipine  5 mg Oral Daily  . amphetamine-dextroamphetamine  30 mg Oral Daily  . atorvastatin  20 mg Oral Daily  . B-complex with vitamin C  1 tablet Oral Daily  . cyclobenzaprine  5 mg Oral TID  . dicyclomine  20 mg Oral TID AC  . diphenoxylate-atropine  2 tablet Oral BID  . DULoxetine  60 mg Oral Daily  . enoxaparin (LOVENOX) injection  40 mg Subcutaneous Q24H  . folic acid  1 mg Oral Daily  . lipase/protease/amylase  24,000 Units Oral TID AC  . octreotide  100 mcg Subcutaneous Q8H  . pantoprazole  40 mg Oral BID AC  . sodium chloride flush  3 mL Intravenous Q12H  . valACYclovir  1,000 mg Oral TID  . cyanocobalamin  1,000 mcg Oral Daily   Continuous Infusions: . sodium chloride 125 mL/hr at 10/23/19 0205     LOS: 8 days    Time spent: 35 minutes    Sidney Ace, MD [facility] Pager [phone]  If 7PM-7AM, please contact night-coverage 10/23/2019, 11:33 AM

## 2019-10-23 NOTE — Plan of Care (Signed)
Patient still having a headache but is relieved with the Toradol. Fewer BMs today.  Voiding adequately.  Continue to monitor.

## 2019-10-24 LAB — BASIC METABOLIC PANEL
Anion gap: 7 (ref 5–15)
BUN: 9 mg/dL (ref 6–20)
CO2: 25 mmol/L (ref 22–32)
Calcium: 8.9 mg/dL (ref 8.9–10.3)
Chloride: 108 mmol/L (ref 98–111)
Creatinine, Ser: 1.05 mg/dL — ABNORMAL HIGH (ref 0.44–1.00)
GFR calc Af Amer: >60 mL/min (ref >60)
GFR calc non Af Amer: >60 mL/min (ref >60)
Glucose, Bld: 86 mg/dL (ref 70–99)
Potassium: 3.6 mmol/L (ref 3.5–5.1)
Sodium: 140 mmol/L (ref 135–145)

## 2019-10-24 LAB — MAGNESIUM: Magnesium: 1.6 mg/dL — ABNORMAL LOW (ref 1.7–2.4)

## 2019-10-24 MED ORDER — MAGNESIUM SULFATE 2 GM/50ML IV SOLN
2.0000 g | Freq: Once | INTRAVENOUS | Status: AC
  Administered 2019-10-24: 2 g via INTRAVENOUS
  Filled 2019-10-24: qty 50

## 2019-10-24 MED ORDER — SUMATRIPTAN SUCCINATE 50 MG PO TABS
25.0000 mg | ORAL_TABLET | ORAL | Status: DC | PRN
  Administered 2019-10-24 – 2019-10-28 (×2): 25 mg via ORAL
  Filled 2019-10-24 (×4): qty 1

## 2019-10-24 MED ORDER — GABAPENTIN 300 MG PO CAPS: 300.0000 mg | ORAL_CAPSULE | Freq: Two times a day (BID) | ORAL | Status: DC | PRN

## 2019-10-24 MED ORDER — HYDROCHLOROTHIAZIDE 12.5 MG PO CAPS
12.5000 mg | ORAL_CAPSULE | Freq: Every day | ORAL | Status: DC
  Administered 2019-10-24: 12.5 mg via ORAL
  Filled 2019-10-24: qty 1

## 2019-10-24 NOTE — Progress Notes (Addendum)
PROGRESS NOTE    Molly Cisneros  NOB:[id number] DOB: 10-25-1985 DOA: 10/15/2019 PCP: Glean Hess, MD   Brief Narrative:  Molly E Jonesis a 34 y.o.Caucasian femalewith medical history significant forhypertension, dyslipidemia and anxiety who presents to the emergency room after she had 2 syncopal episodes at home. Patient states that she has been having loose watery stools since April, 2020and usually has bouts where it gets worse with increased frequencyresulting in dehydration requiring hospitalization. Per patient frequency of diarrhea increased over the last 3 days and has been associated with nausea and vomitingas well as abdominal pain mostly in the periumbilical area.  6/2: Patient seen and examined.  Frequency of diarrhea has improved somewhat but still persistent.  Kidney function improving.  Followed by nephrology.  6/3: Patient seen and examined.  Still with several episodes of loose stools.  Potassium slightly increased to 5.3 this morning.  Patient feels better overall.  6/4: Patient seen and examined.  Does not feel well this morning.  Unable to specify exactly why.  Little bit more nausea and pain the yesterday.  Still loose stools.  Blood pressure remains somewhat elevated.  6/5: Patient seen and examined.  Still not quite feeling well.  Blurred vision persistent but only present when looking at back lit screens like iPad and iPhone.  Still with some loose stools.  Kidney function improving.  Blood pressure elevated.  6/6: Patient seen and examined.  Remains hemodynamically stable however not feeling well.  Concerned about headache and potential shingles recurrence.  Still with blurred vision when looking at phone or iPad.  6/7: Seen and examined.  Appears clinically improved this morning.  Headache improved but still present.  Pain from possible shingles is also improved.  Still with some blurred vision although this is also improving.  Frequency of loose stools is  decreased.   Assessment & Plan:   Active Problems:   Obesity (BMI 30-39.9)   Essential hypertension   Chronic diarrhea of unknown origin   Hypokalemia   Hypomagnesemia   Hyponatremia   Dehydration   AKI (acute kidney injury) ([place])   Depression with anxiety   Abdominal pain   Lactic acidosis  Acute on Chronic diarrhea and abdominal pain Etiology unclear Patient has had extensive work-up in the past with [facility].  Ruled out inflammatory bowel diseases, Celiac. --C diff and GI path both neg on presentation Improving over interval PLAN: No IVF needed at this time --Lomotil BID --home Bentyl and octreotide as scheduled --Trial Creon scheduled TID --Norco PRN for pain --Follow-up with GI as an outpatient  Sepsis, ruled out -- hypotension, tachycardia, leukocytosis due to dehydration and hemoconcentration, all improved after IVF. --C diff and GI path both neg  Acute kidney injury, POA, improved --Cr 2.03 on presentation.  Baseline ~1.2.  Multifactorial and secondary to ATN from hypotension related to volume depletion from diarrhea as well as concomitant use of diuretic therapy. --Creatinine slowly improving --Followed by nephrology PLAN: --Hold home hydrochlorothiazide/Lisinopril  Hypokalemia, resolved Hypomag Most likely related to GI losses from diarrhea as well as from diuretic use Supplement PO potassium and IV mag PRN  Hyponatremia, resolved Most likely hypovolemic Expect improvement in sodium levels with IV fluid resuscitation  Anxiety Continue home Xanax  Hypertension Likely exacerbated by acute illness Hydralazine 5 mg every 6 hours IV as needed Amlodipine 5mg  qD HCTZ 12.5mg  daily  History of shingles Was previous on Valtrex however had refused a few doses due to perceived resolution No obvious shingles skin findings  Given complaints of blurred vision there is concern for zoster ophthalmicus however clinical exam is inconsistent Plan: Valacyclovir  1000 mg 3 times daily x7 days (day 2) Consider MRI if continued concern for ocular involvement   DVT prophylaxis: Lovenox Code Status: Full Family Communication: Spoke to patients father via phone 6/7 Disposition Plan: Status is: Inpatient  Remains inpatient appropriate because:Inpatient level of care appropriate due to severity of illness   Dispo: The patient is from: Home              Anticipated d/c is to: Home              Anticipated d/c date is: 2 days              Patient currently is not medically stable to d/c.  Frequency of loose stools is decreasing.  Patient complaining of some headache.  Will address.  If symptoms improved anticipate discharge on 10/25/2019.  Consultants:   Nephrology  Procedures:   none  Antimicrobials:   none    Subjective: Seen and examined.  No acute events overnight.  Continues to have some diarrhea.  Starting to feel better today  Objective: Vitals:   10/23/19 1547 10/23/19 2034 10/24/19 0528 10/24/19 0650  BP: (!) 143/105 (!) 148/90 (!) 126/104 (!) 138/96  Pulse: 99 97 (!) 103   Resp: 16 16 16    Temp: 98.2 F (36.8 C) 98.1 F (36.7 C) 98.2 F (36.8 C)   TempSrc: Oral Oral Oral   SpO2: 93% 99% 94%   Weight:      Height:        Intake/Output Summary (Last 24 hours) at 10/24/2019 1422 Last data filed at 10/24/2019 1353 Gross per 24 hour  Intake 1200 ml  Output 1000 ml  Net 200 ml   Filed Weights   10/15/19 0638 10/15/19 2104  Weight: 97.7 kg 101 kg    Examination:  General exam: Appears calm and comfortable  Respiratory system: Clear to auscultation. Respiratory effort normal. Cardiovascular system: S1 & S2 heard, RRR. No JVD, murmurs, rubs, gallops or clicks. No pedal edema. Gastrointestinal system: Abdomen is nondistended, soft and nontender. No organomegaly or masses felt. Normal bowel sounds heard. Central nervous system: Alert and oriented. No focal neurological deficits. Extremities: Symmetric 5 x 5 power. Skin:  No rashes, lesions or ulcers Psychiatry: Judgement and insight appear normal. Mood & affect appropriate.     Data Reviewed: I have personally reviewed following labs and imaging studies  CBC: Recent Labs  Lab 10/18/19 0654 10/19/19 0517  WBC 9.1 9.8  HGB 10.1* 10.2*  HCT 30.1* 30.0*  MCV 85.3 84.7  PLT 345 017   Basic Metabolic Panel: Recent Labs  Lab 10/20/19 0808 10/21/19 0517 10/22/19 0553 10/23/19 0625 10/24/19 0610  NA 141 139 140 141 140  K 5.3* 4.5 4.3 4.4 3.6  CL 114* 111 111 111 108  CO2 22 23 23 24 25   GLUCOSE 115* 91 112* 107* 86  BUN 11 9 9 8 9   CREATININE 1.11* 1.14* 1.02* 1.06* 1.05*  CALCIUM 8.7* 8.4* 8.5* 8.6* 8.9  MG 1.6* 1.9 1.5* 1.7 1.6*   GFR: Estimated Creatinine Clearance: 99.5 mL/min (A) (by C-G formula based on SCr of 1.05 mg/dL (H)). Liver Function Tests: No results for input(s): AST, ALT, ALKPHOS, BILITOT, PROT, ALBUMIN in the last 168 hours. No results for input(s): LIPASE, AMYLASE in the last 168 hours. No results for input(s): AMMONIA in the last 168 hours. Coagulation Profile: No  results for input(s): INR, PROTIME in the last 168 hours. Cardiac Enzymes: No results for input(s): CKTOTAL, CKMB, CKMBINDEX, TROPONINI in the last 168 hours. BNP (last 3 results) No results for input(s): PROBNP in the last 8760 hours. HbA1C: No results for input(s): HGBA1C in the last 72 hours. CBG: No results for input(s): GLUCAP in the last 168 hours. Lipid Profile: No results for input(s): CHOL, HDL, LDLCALC, TRIG, CHOLHDL, LDLDIRECT in the last 72 hours. Thyroid Function Tests: No results for input(s): TSH, T4TOTAL, FREET4, T3FREE, THYROIDAB in the last 72 hours. Anemia Panel: No results for input(s): VITAMINB12, FOLATE, FERRITIN, TIBC, IRON, RETICCTPCT in the last 72 hours. Sepsis Labs: Recent Labs  Lab 10/18/19 1810  LATICACIDVEN 1.4    Recent Results (from the past 240 hour(s))  SARS Coronavirus 2 by RT PCR (hospital order, performed in  [facility] hospital lab) Nasopharyngeal Nasopharyngeal Swab     Status: None   Collection Time: 10/15/19  7:24 AM   Specimen: Nasopharyngeal Swab  Result Value Ref Range Status   SARS Coronavirus 2 NEGATIVE NEGATIVE Final    Comment: (NOTE) SARS-CoV-2 target nucleic acids are NOT DETECTED. The SARS-CoV-2 RNA is generally detectable in upper and lower respiratory specimens during the acute phase of infection. The lowest concentration of SARS-CoV-2 viral copies this assay can detect is 250 copies / mL. A negative result does not preclude SARS-CoV-2 infection and should not be used as the sole basis for treatment or other patient management decisions.  A negative result may occur with improper specimen collection / handling, submission of specimen other than nasopharyngeal swab, presence of viral mutation(s) within the areas targeted by this assay, and inadequate number of viral copies (<250 copies / mL). A negative result must be combined with clinical observations, patient history, and epidemiological information. Fact Sheet for Patients:   StrictlyIdeas.no Fact Sheet for Healthcare Providers: BankingDealers.co.za This test is not yet approved or cleared  by the [place] FDA and has been authorized for detection and/or diagnosis of SARS-CoV-2 by FDA under an Emergency Use Authorization (EUA).  This EUA will remain in effect (meaning this test can be used) for the duration of the COVID-19 declaration under Section 564(b)(1) of the Act, 21 U.S.C. section 360bbb-3(b)(1), unless the authorization is terminated or revoked sooner. Performed at [facility], [place], [place] [address]   C Difficile Quick Screen w PCR reflex     Status: None   Collection Time: 10/15/19  9:15 AM   Specimen: Urine, Clean Catch; Stool  Result Value Ref Range Status   C Diff antigen NEGATIVE NEGATIVE Final   C Diff toxin NEGATIVE  NEGATIVE Final   C Diff interpretation No C. difficile detected.  Final    Comment: Performed at [facility], [address] [place], [place] [address]  Gastrointestinal Panel by PCR , Stool     Status: None   Collection Time: 10/15/19  9:15 AM   Specimen: Urine, Clean Catch; Stool  Result Value Ref Range Status   Campylobacter species NOT DETECTED NOT DETECTED Final   Plesimonas shigelloides NOT DETECTED NOT DETECTED Final   Salmonella species NOT DETECTED NOT DETECTED Final   Yersinia enterocolitica NOT DETECTED NOT DETECTED Final   Vibrio species NOT DETECTED NOT DETECTED Final   Vibrio cholerae NOT DETECTED NOT DETECTED Final   Enteroaggregative E coli (EAEC) NOT DETECTED NOT DETECTED Final   Enteropathogenic E coli (EPEC) NOT DETECTED NOT DETECTED Final   Enterotoxigenic E coli (ETEC)  NOT DETECTED NOT DETECTED Final   Shiga like toxin producing E coli (STEC) NOT DETECTED NOT DETECTED Final   Shigella/Enteroinvasive E coli (EIEC) NOT DETECTED NOT DETECTED Final   Cryptosporidium NOT DETECTED NOT DETECTED Final   Cyclospora cayetanensis NOT DETECTED NOT DETECTED Final   Entamoeba histolytica NOT DETECTED NOT DETECTED Final   Giardia lamblia NOT DETECTED NOT DETECTED Final   Adenovirus F40/41 NOT DETECTED NOT DETECTED Final   Astrovirus NOT DETECTED NOT DETECTED Final   Norovirus GI/GII NOT DETECTED NOT DETECTED Final   Rotavirus A NOT DETECTED NOT DETECTED Final   Sapovirus (I, II, IV, and V) NOT DETECTED NOT DETECTED Final    Comment: Performed at [facility], [address] [place], [place] [address]         Radiology Studies: No results found.      Scheduled Meds: . amLODipine  5 mg Oral Daily  . amphetamine-dextroamphetamine  30 mg Oral Daily  . atorvastatin  20 mg Oral Daily  . B-complex with vitamin C  1 tablet Oral Daily  . cyclobenzaprine  5 mg Oral TID  . dicyclomine  20 mg Oral TID AC  . diphenoxylate-atropine  2 tablet Oral BID  .  DULoxetine  60 mg Oral Daily  . enoxaparin (LOVENOX) injection  40 mg Subcutaneous Q24H  . folic acid  1 mg Oral Daily  . hydrochlorothiazide  12.5 mg Oral Daily  . lipase/protease/amylase  24,000 Units Oral TID AC  . octreotide  100 mcg Subcutaneous Q8H  . pantoprazole  40 mg Oral BID AC  . sodium chloride flush  3 mL Intravenous Q12H  . valACYclovir  1,000 mg Oral TID  . cyanocobalamin  1,000 mcg Oral Daily   Continuous Infusions: . magnesium sulfate bolus IVPB       LOS: 9 days    Time spent: 35 minutes    Sidney Ace, MD [facility] Pager [phone]  If 7PM-7AM, please contact night-coverage 10/24/2019, 2:22 PM

## 2019-10-25 LAB — MAGNESIUM: Magnesium: 1.8 mg/dL (ref 1.7–2.4)

## 2019-10-25 MED ORDER — AMLODIPINE BESYLATE 10 MG PO TABS
10.0000 mg | ORAL_TABLET | Freq: Every day | ORAL | Status: DC
  Administered 2019-10-25 – 2019-10-28 (×4): 10 mg via ORAL
  Filled 2019-10-25 (×4): qty 1

## 2019-10-25 MED ORDER — ENSURE ENLIVE PO LIQD
237.0000 mL | Freq: Two times a day (BID) | ORAL | Status: DC
  Administered 2019-10-27: 237 mL via ORAL

## 2019-10-25 MED ORDER — VITAMIN D 25 MCG (1000 UNIT) PO TABS
1000.0000 [IU] | ORAL_TABLET | Freq: Every day | ORAL | Status: DC
  Administered 2019-10-25 – 2019-10-27 (×3): 1000 [IU] via ORAL
  Filled 2019-10-25 (×3): qty 1

## 2019-10-25 MED ORDER — LOPERAMIDE HCL 2 MG PO CAPS: 2.0000 mg | ORAL_CAPSULE | ORAL | Status: DC | PRN

## 2019-10-25 MED ORDER — BUTALBITAL-APAP-CAFFEINE 50-325-40 MG PO TABS
1.0000 | ORAL_TABLET | ORAL | Status: DC | PRN
  Administered 2019-10-25 – 2019-10-27 (×3): 1 via ORAL
  Filled 2019-10-25 (×3): qty 1

## 2019-10-25 MED ORDER — HYDROCHLOROTHIAZIDE 25 MG PO TABS
25.0000 mg | ORAL_TABLET | Freq: Every day | ORAL | Status: DC
  Administered 2019-10-25: 25 mg via ORAL
  Filled 2019-10-25: qty 1

## 2019-10-25 NOTE — Progress Notes (Signed)
Patient stated that she woke up and had "aspirated" and was coughing and had a severe headache.  Strong cough, crackers given per request along with Norco for pain; Hydralazine to be given for elevated BP: 145/106. Will continue to monitor BP and H/A. Barbaraann Faster, RN 5:08 AM 10/25/2019

## 2019-10-25 NOTE — Progress Notes (Signed)
PROGRESS NOTE    Molly Cisneros  WUX:[id number] DOB: 09/01/1985 DOA: 10/15/2019 PCP: Glean Hess, MD   Brief Narrative:  Molly E Jonesis a 34 y.o.Caucasian femalewith medical history significant forhypertension, dyslipidemia and anxiety who presents to the emergency room after she had 2 syncopal episodes at home. Patient states that she has been having loose watery stools since April, 2020and usually has bouts where it gets worse with increased frequencyresulting in dehydration requiring hospitalization. Per patient frequency of diarrhea increased over the last 3 days and has been associated with nausea and vomitingas well as abdominal pain mostly in the periumbilical area.  6/2: Patient seen and examined.  Frequency of diarrhea has improved somewhat but still persistent.  Kidney function improving.  Followed by nephrology.  6/3: Patient seen and examined.  Still with several episodes of loose stools.  Potassium slightly increased to 5.3 this morning.  Patient feels better overall.  6/4: Patient seen and examined.  Does not feel well this morning.  Unable to specify exactly why.  Little bit more nausea and pain the yesterday.  Still loose stools.  Blood pressure remains somewhat elevated.  6/5: Patient seen and examined.  Still not quite feeling well.  Blurred vision persistent but only present when looking at back lit screens like iPad and iPhone.  Still with some loose stools.  Kidney function improving.  Blood pressure elevated.  6/6: Patient seen and examined.  Remains hemodynamically stable however not feeling well.  Concerned about headache and potential shingles recurrence.  Still with blurred vision when looking at phone or iPad.  6/7: Seen and examined.  Appears clinically improved this morning.  Headache improved but still present.  Pain from possible shingles is also improved.  Still with some blurred vision although this is also improving.  Frequency of loose stools is  decreased.  6/8: Patient seen and examined.  Patient states her diarrhea is improving.  2 loose bowel movements reported over last 24 hours.  Still with significant headache and some photosensitivity.  Limited response to Imitrex.   Assessment & Plan:   Active Problems:   Obesity (BMI 30-39.9)   Essential hypertension   Chronic diarrhea of unknown origin   Hypokalemia   Hypomagnesemia   Hyponatremia   Dehydration   AKI (acute kidney injury) ([place])   Depression with anxiety   Abdominal pain   Lactic acidosis  Acute on Chronic diarrhea and abdominal pain Etiology unclear Patient has had extensive work-up in the past with [facility].  Ruled out inflammatory bowel diseases, Celiac. --C diff and GI path both neg on presentation Improving over interval PLAN: No IVF needed at this time --DC Lomotil for now as it may be contributing to headache --As needed Imodium added for loose stool --home Bentyl and octreotide scheduled --Trial Creon scheduled TID --Norco PRN for pain --Follow-up with GI as an outpatient  Headache Seems more like tension headache however has had limited response to Toradol and Phenergan Also limited response to Imitrex Suspect medication may be driving force DC scheduled Lomotil for now Add as needed Imodium Also as needed Fioricet If headache resolved can likely discharge within 24 hours  Sepsis, ruled out -- hypotension, tachycardia, leukocytosis due to dehydration and hemoconcentration, all improved after IVF. --C diff and GI path both neg  Acute kidney injury, POA, improved --Cr 2.03 on presentation.  Baseline ~1.2.  Multifactorial and secondary to ATN from hypotension related to volume depletion from diarrhea as well as concomitant use of diuretic therapy. --  Creatinine slowly improving --Followed by nephrology PLAN: --Hold home hydrochlorothiazide/Lisinopril  Hypokalemia, resolved Hypomag Most likely related to GI losses from diarrhea as well as  from diuretic use Supplement PO potassium and IV mag PRN  Hyponatremia, resolved Most likely hypovolemic Expect improvement in sodium levels with IV fluid resuscitation  Anxiety Continue home Xanax  Hypertension Likely exacerbated by acute illness Hydralazine 5 mg every 6 hours IV as needed Amlodipine 5mg  qD HCTZ 12.5mg  daily  History of shingles Was previous on Valtrex however had refused a few doses due to perceived resolution No obvious shingles skin findings Given complaints of blurred vision there is concern for zoster ophthalmicus however clinical exam is inconsistent Plan: Valacyclovir 1000 mg 3 times daily x7 days (day 2) Consider MRI if continued concern for ocular involvement   DVT prophylaxis: Lovenox Code Status: Full Family Communication: Spoke to patients father via phone 6/7 Disposition Plan: Status is: Inpatient  Remains inpatient appropriate because:Inpatient level of care appropriate due to severity of illness   Dispo: The patient is from: Home              Anticipated d/c is to: Home              Anticipated d/c date is: 1 day              Patient currently is not medically stable to d/c.  Only 2 episodes of loose bowels yesterday.  Stool becoming more formed.  Patient with significant and persistent headache associated with photosensitivity.  Possible medication effect.  Discontinuing scheduled Lomotil.  As needed Fioricet added.  If symptoms improve can consider discharge on 10/26/2019.  Consultants:   Nephrology  Procedures:   none  Antimicrobials:   none    Subjective: Seen and examined.  No acute events overnight.  Continues to have some diarrhea.  Starting to feel better today  Objective: Vitals:   10/25/19 0838 10/25/19 0840 10/25/19 1205 10/25/19 1211  BP: (!) 162/122 (!) 160/89 (!) 133/115 (!) 136/99  Pulse: 94 95 (!) 104 (!) 106  Resp:   15   Temp:   98.3 F (36.8 C)   TempSrc:   Oral   SpO2:   99% 97%  Weight:       Height:        Intake/Output Summary (Last 24 hours) at 10/25/2019 1441 Last data filed at 10/25/2019 1300 Gross per 24 hour  Intake 240 ml  Output --  Net 240 ml   Filed Weights   10/15/19 0638 10/15/19 2104  Weight: 97.7 kg 101 kg    Examination:  General exam: Appears calm and comfortable  Respiratory system: Clear to auscultation. Respiratory effort normal. Cardiovascular system: S1 & S2 heard, RRR. No JVD, murmurs, rubs, gallops or clicks. No pedal edema. Gastrointestinal system: Abdomen is nondistended, soft and nontender. No organomegaly or masses felt. Normal bowel sounds heard. Central nervous system: Alert and oriented. No focal neurological deficits. Extremities: Symmetric 5 x 5 power. Skin: No rashes, lesions or ulcers Psychiatry: Judgement and insight appear normal. Mood & affect appropriate.     Data Reviewed: I have personally reviewed following labs and imaging studies  CBC: Recent Labs  Lab 10/19/19 0517  WBC 9.8  HGB 10.2*  HCT 30.0*  MCV 84.7  PLT 706   Basic Metabolic Panel: Recent Labs  Lab 10/20/19 0808 10/20/19 0808 10/21/19 0517 10/22/19 0553 10/23/19 0625 10/24/19 0610 10/25/19 0456  NA 141  --  139 140 141 140  --  K 5.3*  --  4.5 4.3 4.4 3.6  --   CL 114*  --  111 111 111 108  --   CO2 22  --  23 23 24 25   --   GLUCOSE 115*  --  91 112* 107* 86  --   BUN 11  --  9 9 8 9   --   CREATININE 1.11*  --  1.14* 1.02* 1.06* 1.05*  --   CALCIUM 8.7*  --  8.4* 8.5* 8.6* 8.9  --   MG 1.6*   < > 1.9 1.5* 1.7 1.6* 1.8   < > = values in this interval not displayed.   GFR: Estimated Creatinine Clearance: 99.5 mL/min (A) (by C-G formula based on SCr of 1.05 mg/dL (H)). Liver Function Tests: No results for input(s): AST, ALT, ALKPHOS, BILITOT, PROT, ALBUMIN in the last 168 hours. No results for input(s): LIPASE, AMYLASE in the last 168 hours. No results for input(s): AMMONIA in the last 168 hours. Coagulation Profile: No results for  input(s): INR, PROTIME in the last 168 hours. Cardiac Enzymes: No results for input(s): CKTOTAL, CKMB, CKMBINDEX, TROPONINI in the last 168 hours. BNP (last 3 results) No results for input(s): PROBNP in the last 8760 hours. HbA1C: No results for input(s): HGBA1C in the last 72 hours. CBG: No results for input(s): GLUCAP in the last 168 hours. Lipid Profile: No results for input(s): CHOL, HDL, LDLCALC, TRIG, CHOLHDL, LDLDIRECT in the last 72 hours. Thyroid Function Tests: No results for input(s): TSH, T4TOTAL, FREET4, T3FREE, THYROIDAB in the last 72 hours. Anemia Panel: No results for input(s): VITAMINB12, FOLATE, FERRITIN, TIBC, IRON, RETICCTPCT in the last 72 hours. Sepsis Labs: Recent Labs  Lab 10/18/19 1810  LATICACIDVEN 1.4    No results found for this or any previous visit (from the past 240 hour(s)).       Radiology Studies: No results found.      Scheduled Meds: . amLODipine  10 mg Oral Daily  . amphetamine-dextroamphetamine  30 mg Oral Daily  . atorvastatin  20 mg Oral Daily  . B-complex with vitamin C  1 tablet Oral Daily  . cholecalciferol  1,000 Units Oral Daily  . cyclobenzaprine  5 mg Oral TID  . dicyclomine  20 mg Oral TID AC  . DULoxetine  60 mg Oral Daily  . enoxaparin (LOVENOX) injection  40 mg Subcutaneous Q24H  . feeding supplement (ENSURE ENLIVE)  237 mL Oral BID BM  . folic acid  1 mg Oral Daily  . hydrochlorothiazide  25 mg Oral Daily  . lipase/protease/amylase  24,000 Units Oral TID AC  . octreotide  100 mcg Subcutaneous Q8H  . pantoprazole  40 mg Oral BID AC  . sodium chloride flush  3 mL Intravenous Q12H  . valACYclovir  1,000 mg Oral TID  . cyanocobalamin  1,000 mcg Oral Daily   Continuous Infusions:    LOS: 10 days    Time spent: 35 minutes    Sidney Ace, MD [facility] Pager [phone]  If 7PM-7AM, please contact night-coverage 10/25/2019, 2:41 PM

## 2019-10-25 NOTE — Progress Notes (Addendum)
Initial Nutrition Assessment  DOCUMENTATION CODES:   Not applicable  INTERVENTION:   Ensure Enlive po BID, each supplement provides 350 kcal and 20 grams of protein  Vitamin D3 1000 units po daily   NUTRITION DIAGNOSIS:   Increased nutrient needs related to chronic illness(chronic diarrhea and malabsorption) as evidenced by other (comment)(per chart review).  GOAL:   Patient will meet greater than or equal to 90% of their needs  MONITOR:   PO intake, Supplement acceptance, Labs, Weight trends, Skin, I & O's  REASON FOR ASSESSMENT:   LOS    ASSESSMENT:   34 y.o. Caucasian female with medical history significant for hypertension, dyslipidemia, chronic diarrhea and anxiety who presents to the emergency room after she had 2 syncopal episodes at home.  Spoke with pt today. Pt is known to this RD from a recent previous admit. Pt reports intermittent poor appetite and oral intake at home but reports that she drinks supplements at times when her appetite is not good. Pt noted to have some vitamin deficiencies on her last admit but reports that she has been taking her vitamins at home. Pt continues to have diarrhea but reports that this has improved some with the initiation of creon. Per chart, pt's weight has remained stable since her last admit. RD will add supplements to help pt meet her estimated needs; pt enjoys vanilla flavors. RD will also add vitamin D as this was significantly low during her last admit and vitamin D deficiency can lead to diarrhea and weakness. Of note, pt drinks lactaid milk and does not eat red meat; RD will make a note of this in health touch. Pt currently eating 85-100% of meals in hospital.   Medications reviewed and include: B-Complex with C, lomotil, folic acid, creon, octreotide, B12  Labs reviewed: K 3.6 wnl, creat 1.05(H), Mg 1.8 wnl Folate 5.2(L)- 1/27 Vitamin D 8.43(L)- 2/18 Hgb 10.2(L), Hct 30.0(L)  NUTRITION - FOCUSED PHYSICAL EXAM:    Most  Recent Value  Orbital Region  No depletion  Upper Arm Region  No depletion  Thoracic and Lumbar Region  No depletion  Buccal Region  No depletion  Temple Region  No depletion  Clavicle Bone Region  No depletion  Clavicle and Acromion Bone Region  No depletion  Scapular Bone Region  No depletion  Dorsal Hand  No depletion  Patellar Region  No depletion  Anterior Thigh Region  No depletion  Posterior Calf Region  No depletion  Edema (RD Assessment)  None  Hair  Reviewed  Eyes  Reviewed  Mouth  Reviewed  Skin  Reviewed  Nails  Reviewed     Diet Order:   Diet Order            Diet regular Room service appropriate? Yes; Fluid consistency: Thin  Diet effective now             EDUCATION NEEDS:   Education needs have been addressed  Skin:  Skin Assessment: Reviewed RN Assessment(ecchymosis)  Last BM:  6/7 - type 6  Height:   Ht Readings from Last 1 Encounters:  10/15/19 6' (1.829 m)    Weight:   Wt Readings from Last 1 Encounters:  10/15/19 101 kg    Ideal Body Weight:  72.7 kg  BMI:  Body mass index is 30.2 kg/m.  Estimated Nutritional Needs:   Kcal:  2000-2300kcal/day  Protein:  100-115g/day  Fluid:  >2.2L/day  Koleen Distance MS, RD, LDN Please refer to [facility] for RD and/or RD  on-call/weekend/after hours pager

## 2019-10-25 NOTE — Progress Notes (Signed)
Sleeping at intervals; BP rechecked at 147/117, pulse 101; will report to oncoming RN and continue to monitor BP. Barbaraann Faster, RN 6/8/20216:49 AM

## 2019-10-26 ENCOUNTER — Inpatient Hospital Stay: Payer: PRIVATE HEALTH INSURANCE

## 2019-10-26 DIAGNOSIS — E876 Hypokalemia: Secondary | ICD-10-CM

## 2019-10-26 DIAGNOSIS — G500 Trigeminal neuralgia: Secondary | ICD-10-CM

## 2019-10-26 DIAGNOSIS — H538 Other visual disturbances: Secondary | ICD-10-CM

## 2019-10-26 DIAGNOSIS — R197 Diarrhea, unspecified: Secondary | ICD-10-CM

## 2019-10-26 DIAGNOSIS — R519 Headache, unspecified: Secondary | ICD-10-CM

## 2019-10-26 LAB — TSH: TSH: 2.019 u[IU]/mL (ref 0.350–4.500)

## 2019-10-26 LAB — SEDIMENTATION RATE: Sed Rate: 19 mm/hr (ref 0–20)

## 2019-10-26 LAB — MAGNESIUM: Magnesium: 1.8 mg/dL (ref 1.7–2.4)

## 2019-10-26 MED ORDER — LORAZEPAM 2 MG/ML IJ SOLN
0.5000 mg | Freq: Once | INTRAMUSCULAR | Status: AC | PRN
  Administered 2019-10-26: 0.5 mg via INTRAVENOUS
  Filled 2019-10-26: qty 1

## 2019-10-26 MED ORDER — DEXAMETHASONE SODIUM PHOSPHATE 10 MG/ML IJ SOLN
10.0000 mg | Freq: Once | INTRAMUSCULAR | Status: AC
  Administered 2019-10-26: 10 mg via INTRAVENOUS
  Filled 2019-10-26: qty 1

## 2019-10-26 MED ORDER — MAGNESIUM SULFATE 2 GM/50ML IV SOLN
2.0000 g | Freq: Once | INTRAVENOUS | Status: AC
  Administered 2019-10-26: 2 g via INTRAVENOUS
  Filled 2019-10-26: qty 50

## 2019-10-26 MED ORDER — VALPROATE SODIUM 500 MG/5ML IV SOLN
750.0000 mg | Freq: Once | INTRAVENOUS | Status: AC
  Administered 2019-10-26: 750 mg via INTRAVENOUS
  Filled 2019-10-26: qty 7.5

## 2019-10-26 MED ORDER — GABAPENTIN 300 MG PO CAPS
300.0000 mg | ORAL_CAPSULE | Freq: Two times a day (BID) | ORAL | Status: DC
  Administered 2019-10-26 – 2019-10-28 (×4): 300 mg via ORAL
  Filled 2019-10-26 (×4): qty 1

## 2019-10-26 MED ORDER — CHOLESTYRAMINE 4 G PO PACK
4.0000 g | PACK | Freq: Three times a day (TID) | ORAL | Status: DC
  Administered 2019-10-26 – 2019-10-27 (×6): 4 g via ORAL
  Filled 2019-10-26 (×9): qty 1

## 2019-10-26 MED ORDER — PANCRELIPASE (LIP-PROT-AMYL) 12000-38000 UNITS PO CPEP
72000.0000 [IU] | ORAL_CAPSULE | Freq: Three times a day (TID) | ORAL | Status: DC
  Administered 2019-10-26 – 2019-10-28 (×5): 72000 [IU] via ORAL
  Filled 2019-10-26 (×7): qty 6

## 2019-10-26 MED ORDER — KETOROLAC TROMETHAMINE 15 MG/ML IJ SOLN
15.0000 mg | Freq: Once | INTRAMUSCULAR | Status: AC
  Administered 2019-10-26: 15 mg via INTRAVENOUS
  Filled 2019-10-26: qty 1

## 2019-10-26 MED ORDER — CARBAMAZEPINE 200 MG PO TABS
200.0000 mg | ORAL_TABLET | Freq: Two times a day (BID) | ORAL | Status: DC
  Administered 2019-10-26 – 2019-10-28 (×4): 200 mg via ORAL
  Filled 2019-10-26 (×5): qty 1

## 2019-10-26 NOTE — Consult Note (Signed)
Reason for Consult:Facial pain Requesting Physician: Leslye Peer  CC: facial pain  I have been asked by Dr. Leslye Peer to see this patient in consultation for headache.  HPI: Molly Cisneros is an 34 y.o. female with medical history significant for hypertension, dyslipidemia and anxiety who presents to the emergency room after she had 2 syncopal episodes at home.  Patient states that she has been having loose watery stools since April, 2020.  Reports that on Sunday she developed right facial pain.  Has had three bouts of shingles in the past and now has intermittent right facial pain.  Noted a cold sore coming as well and started her Valacyclovir.  The right facial pain subsided but today she has started to have left facial pain.  She has never had left facial pain before.  Describes the pain as lancinating and sharp.  Occurs every 1-2 hours.  Left face is sensitive to the touch.  Has had no the most relief from Toradol and Phenergan but this is short-lived.  Had no relief from Depakote or Magnesium.   Also reports that on Sunday she developed double vision.  This has been persistent.    Past Medical History:  Diagnosis Date  . Acute appendicitis with localized peritonitis 11/06/2018  . Anxiety   . C. difficile diarrhea 02/05/2019  . Clostridium difficile diarrhea   . High cholesterol   . Hypertension   . Hypokalemia 02/05/2019  . Loss of weight   . Severe sepsis ([place]) 02/05/2019  . Shingles    reoccuring shingles on face    Past Surgical History:  Procedure Laterality Date  . COLONOSCOPY WITH PROPOFOL N/A 12/23/2018   Procedure: COLONOSCOPY WITH PROPOFOL;  Surgeon: Lin Landsman, MD;  Location: [facility] ENDOSCOPY;  Service: Gastroenterology;  Laterality: N/A;  . ESOPHAGOGASTRODUODENOSCOPY (EGD) WITH PROPOFOL N/A 12/23/2018   Procedure: ESOPHAGOGASTRODUODENOSCOPY (EGD) WITH PROPOFOL;  Surgeon: Lin Landsman, MD;  Location: [facility] ENDOSCOPY;  Service: Gastroenterology;  Laterality: N/A;  . GIVENS  CAPSULE STUDY N/A 06/14/2019   Procedure: GIVENS CAPSULE STUDY;  Surgeon: Lin Landsman, MD;  Location: [facility] ENDOSCOPY;  Service: Gastroenterology;  Laterality: N/A;  . GIVENS CAPSULE STUDY N/A 06/16/2019   Procedure: GIVENS CAPSULE STUDY;  Surgeon: Lin Landsman, MD;  Location: [facility] ENDOSCOPY;  Service: Gastroenterology;  Laterality: N/A;  . LAPAROSCOPIC APPENDECTOMY N/A 11/06/2018   Procedure: APPENDECTOMY LAPAROSCOPIC;  Surgeon: Herbert Pun, MD;  Location: [facility] ORS;  Service: General;  Laterality: N/A;    Family History  Problem Relation Age of Onset  . Non-Hodgkin's lymphoma Father 89       Basil Cell  . Pancreatic cancer Maternal Grandmother 36  . Thyroid cancer Maternal Grandmother 10  . Throat cancer Paternal Grandfather 54    Social History:  reports that she has never smoked. She has never used smokeless tobacco. She reports current alcohol use of about 2.0 standard drinks of alcohol per week. She reports that she does not use drugs.  Allergies  Allergen Reactions  . Shellfish Allergy Anaphylaxis  . Aloe Vera Dermatitis    Medications:  I have reviewed the patient's current medications. Prior to Admission:  Medications Prior to Admission  Medication Sig Dispense Refill Last Dose  . ALPRAZolam (XANAX) 1 MG tablet Take 1 mg by mouth 3 (three) times daily as needed for anxiety.    Unknown at PRN  . amphetamine-dextroamphetamine (ADDERALL XR) 30 MG 24 hr capsule Take 30 mg by mouth daily.    10/14/2019 at Unknown time  .  amphetamine-dextroamphetamine (ADDERALL) 20 MG tablet Take 20 mg by mouth 2 (two) times daily.   Past Week at Unknown time  . atorvastatin (LIPITOR) 20 MG tablet TAKE (1) TABLET BY MOUTH EVERY DAY (Patient taking differently: Take 20 mg by mouth daily. ) 90 tablet 1 10/14/2019 at Unknown time  . B Complex-C (B-COMPLEX WITH VITAMIN C) tablet Take 1 tablet by mouth daily. 30 tablet 1   . cyclobenzaprine (FLEXERIL) 5 MG tablet Take 1 tablet (5 mg  total) by mouth 3 (three) times daily. 60 tablet 0 Unknown at PRN  . dicyclomine (BENTYL) 20 MG tablet TAKE (1) TABLET BY MOUTH THREE TIMES A DAY BEFORE MEALS (Patient taking differently: Take 20 mg by mouth 3 (three) times daily before meals. ) 90 tablet 3 10/14/2019 at Unknown time  . diphenoxylate-atropine (LOMOTIL) 2.5-0.025 MG tablet Take 2 tablets by mouth 4 (four) times daily as needed for diarrhea or loose stools. 30 tablet 0 Unknown at PRN  . DULoxetine (CYMBALTA) 60 MG capsule Take 60 mg by mouth daily.    10/14/2019 at Unknown time  . etonogestrel-ethinyl estradiol (NUVARING) 0.12-0.015 MG/24HR vaginal ring USE AS DIRECTED 3 each 3   . folic acid (FOLVITE) 1 MG tablet Take 1 tablet (1 mg total) by mouth daily. 30 tablet 0 10/14/2019 at Unknown time  . gabapentin (NEURONTIN) 300 MG capsule TAKE (1) CAPSULE BY MOUTH TWICE DAILY (Patient taking differently: Take 300 mg by mouth 2 (two) times daily as needed (pain). ) 60 capsule 2 Unknown at PRN  . hydrOXYzine (VISTARIL) 25 MG capsule Take 1-2 capsules (25-50 mg total) by mouth 4 (four) times daily as needed for anxiety or itching. 60 capsule 2 Unknown at PRN  . Octreotide Acetate 200 MCG/ML SOLN Inject 0.5 mLs (100 mcg total) as directed every 8 (eight) hours as needed (diarrhea). 45 mL 0 Unknown at PRN  . ondansetron (ZOFRAN-ODT) 8 MG disintegrating tablet TAKE 1 TABLET BY MOUTH EVERY 8 HOURS AS NEEDED FOR NAUSEA OR VOMITING. (Patient taking differently: Take 8 mg by mouth every 8 (eight) hours as needed for nausea or vomiting. ) 20 tablet 0 Unknown at PRN  . pantoprazole (PROTONIX) 40 MG tablet TAKE ONE TABLET BY MOUTH TWICE DAILY. 30MINUTES PRIOR TO MEALS (Patient taking differently: Take 40 mg by mouth 2 (two) times daily before a meal. ) 60 tablet 12 10/14/2019 at Unknown time  . potassium chloride 20 MEQ/15ML (10%) SOLN Take 15 mLs (20 mEq total) by mouth 2 (two) times daily. 473 mL 0   . promethazine (PHENERGAN) 25 MG tablet Take 1 tablet (25  mg total) by mouth every 8 (eight) hours as needed for nausea or vomiting. 20 tablet 1 Unknown at PRN  . traMADol (ULTRAM) 50 MG tablet Take 1 tablet (50 mg total) by mouth every 6 (six) hours as needed. 20 tablet 0 Unknown at PRN  . TUBERCULIN SYR 1CC/27GX1/2" 27G X 1/2" 1 ML MISC Use for octreotide injection every 8 hours as needed for diarrhea 90 each 0   . valACYclovir (VALTREX) 1000 MG tablet Take 1,000 mg by mouth 2 (two) times daily as needed.   Unknown at PRN  . vitamin B-12 1000 MCG tablet Take 1 tablet (1,000 mcg total) by mouth daily. 30 tablet 0   . feeding supplement, ENSURE ENLIVE, (ENSURE ENLIVE) LIQD Take 237 mLs by mouth 2 (two) times daily between meals. 237 mL 12    Scheduled: . amLODipine  10 mg Oral Daily  . amphetamine-dextroamphetamine  30  mg Oral Daily  . atorvastatin  20 mg Oral Daily  . B-complex with vitamin C  1 tablet Oral Daily  . carbamazepine  200 mg Oral BID  . cholecalciferol  1,000 Units Oral Daily  . cholestyramine  4 g Oral TID  . cyclobenzaprine  5 mg Oral TID  . dicyclomine  20 mg Oral TID AC  . DULoxetine  60 mg Oral Daily  . enoxaparin (LOVENOX) injection  40 mg Subcutaneous Q24H  . feeding supplement (ENSURE ENLIVE)  237 mL Oral BID BM  . folic acid  1 mg Oral Daily  . gabapentin  300 mg Oral BID  . lipase/protease/amylase  72,000 Units Oral TID AC  . octreotide  100 mcg Subcutaneous Q8H  . pantoprazole  40 mg Oral BID AC  . sodium chloride flush  3 mL Intravenous Q12H  . valACYclovir  1,000 mg Oral TID  . cyanocobalamin  1,000 mcg Oral Daily    ROS: History obtained from the patient  General ROS: negative for - chills, fatigue, fever, night sweats, weight gain or weight loss Psychological ROS: negative for - behavioral disorder, hallucinations, memory difficulties, mood swings or suicidal ideation Ophthalmic ROS: diplopia ENT ROS: negative for - epistaxis, nasal discharge, oral lesions, sore throat, tinnitus or vertigo Allergy and  Immunology ROS: negative for - hives or itchy/watery eyes Hematological and Lymphatic ROS: negative for - bleeding problems, bruising or swollen lymph nodes Endocrine ROS: negative for - galactorrhea, hair pattern changes, polydipsia/polyuria or temperature intolerance Respiratory ROS: negative for - cough, hemoptysis, shortness of breath or wheezing Cardiovascular ROS: negative for - chest pain, dyspnea on exertion, edema or irregular heartbeat Gastrointestinal ROS: abdominal pain, diarrhea, nausea/vomiting Genito-Urinary ROS: negative for - dysuria, hematuria, incontinence or urinary frequency/urgency Musculoskeletal ROS: negative for - joint swelling or muscular weakness Neurological ROS: as noted in HPI Dermatological ROS: negative for rash and skin lesion changes  Physical Examination: Blood pressure (!) 134/98, pulse 100, temperature 97.9 F (36.6 C), temperature source Oral, resp. rate 16, height 6' (1.829 m), weight 101 kg, SpO2 96 %.  HEENT-  Normocephalic, no lesions, without obvious abnormality.  Normal external eye and conjunctiva.  Normal TM's bilaterally.  Normal auditory canals and external ears. Normal external nose, mucus membranes and septum.  Normal pharynx. Cardiovascular- S1, S2 normal, pulses palpable throughout   Lungs- chest clear, no wheezing, rales, normal symmetric air entry, Heart exam - S1, S2 normal, no murmur, no gallop, rate regular Abdomen- soft, non-tender; bowel sounds normal; no masses,  no organomegaly Extremities- no edema Lymph-no adenopathy palpable Musculoskeletal-no joint tenderness, deformity or swelling Skin-warm and dry, no hyperpigmentation, vitiligo, or suspicious lesions  Neurological Examination   Mental Status: Alert, oriented, thought content appropriate.  Speech fluent without evidence of aphasia.  Able to follow 3 step commands without difficulty. Cranial Nerves: II: Discs flat bilaterally; Visual fields grossly normal, pupils dilated,  equal, round, reactive to light and accommodation III,IV, VI: ptosis not present, weak left lateral rectus with esotropia of left eye and nystagmus on left lateral gaze V,VII: smile symmetric, facial light touch sensation hyperesthetic VIII: hearing normal bilaterally IX,X: gag reflex present XI: bilateral shoulder shrug XII: midline tongue extension Motor: Right : Upper extremity   5/5    Left:     Upper extremity   5/5  Lower extremity   5/5     Lower extremity   5/5 Tone and bulk:normal tone throughout; no atrophy noted Sensory: Pinprick and light touch intact throughout, bilaterally  Deep Tendon Reflexes: Symmetric throughout Plantars: Right: mute   Left: mute Cerebellar: Normal finger-to-nose and normal heel-to-shin testing bilaterally Gait: not tested due to pain    Laboratory Studies:   Basic Metabolic Panel: Recent Labs  Lab 10/20/19 0808 10/20/19 0808 10/21/19 0517 10/21/19 0517 10/22/19 0553 10/23/19 0625 10/24/19 0610 10/25/19 0456 10/26/19 0633  NA 141  --  139  --  140 141 140  --   --   K 5.3*  --  4.5  --  4.3 4.4 3.6  --   --   CL 114*  --  111  --  111 111 108  --   --   CO2 22  --  23  --  _0 --   --   GLUCOSE 115*  --  91  --  112* 107* 86  --   --   BUN 11  --  9  --  _1 --   --   CREATININE 1.11*  --  1.14*  --  1.02* 1.06* 1.05*  --   --   CALCIUM 8.7*   < > 8.4*   < > 8.5* 8.6* 8.9  --   --   MG 1.6*   < > 1.9   < > 1.5* 1.7 1.6* 1.8 1.8   < > = values in this interval not displayed.    Liver Function Tests: No results for input(s): AST, ALT, ALKPHOS, BILITOT, PROT, ALBUMIN in the last 168 hours. No results for input(s): LIPASE, AMYLASE in the last 168 hours. No results for input(s): AMMONIA in the last 168 hours.  CBC: No results for input(s): WBC, NEUTROABS, HGB, HCT, MCV, PLT in the last 168 hours.  Cardiac Enzymes: No results for input(s): CKTOTAL, CKMB, CKMBINDEX, TROPONINI in the last 168 hours.  BNP: Invalid input(s):  POCBNP  CBG: No results for input(s): GLUCAP in the last 168 hours.  Microbiology: Results for orders placed or performed during the hospital encounter of 10/15/19  SARS Coronavirus 2 by RT PCR (hospital order, performed in [facility] hospital lab) Nasopharyngeal Nasopharyngeal Swab     Status: None   Collection Time: 10/15/19  7:24 AM   Specimen: Nasopharyngeal Swab  Result Value Ref Range Status   SARS Coronavirus 2 NEGATIVE NEGATIVE Final    Comment: (NOTE) SARS-CoV-2 target nucleic acids are NOT DETECTED. The SARS-CoV-2 RNA is generally detectable in upper and lower respiratory specimens during the acute phase of infection. The lowest concentration of SARS-CoV-2 viral copies this assay can detect is 250 copies / mL. A negative result does not preclude SARS-CoV-2 infection and should not be used as the sole basis for treatment or other patient management decisions.  A negative result may occur with improper specimen collection / handling, submission of specimen other than nasopharyngeal swab, presence of viral mutation(s) within the areas targeted by this assay, and inadequate number of viral copies (<250 copies / mL). A negative result must be combined with clinical observations, patient history, and epidemiological information. Fact Sheet for Patients:   StrictlyIdeas.no Fact Sheet for Healthcare Providers: BankingDealers.co.za This test is not yet approved or cleared  by the [place] FDA and has been authorized for detection and/or diagnosis of SARS-CoV-2 by FDA under an Emergency Use Authorization (EUA).  This EUA will remain in effect (meaning this test can be used) for the duration of the COVID-19 declaration under Section 564(b)(1) of the Act, 21 U.S.C. section 360bbb-3(b)(1), unless the authorization  is terminated or revoked sooner. Performed at [facility], [place], [place] [address]   C  Difficile Quick Screen w PCR reflex     Status: None   Collection Time: 10/15/19  9:15 AM   Specimen: Urine, Clean Catch; Stool  Result Value Ref Range Status   C Diff antigen NEGATIVE NEGATIVE Final   C Diff toxin NEGATIVE NEGATIVE Final   C Diff interpretation No C. difficile detected.  Final    Comment: Performed at [facility], [address] [place], [place] [address]  Gastrointestinal Panel by PCR , Stool     Status: None   Collection Time: 10/15/19  9:15 AM   Specimen: Urine, Clean Catch; Stool  Result Value Ref Range Status   Campylobacter species NOT DETECTED NOT DETECTED Final   Plesimonas shigelloides NOT DETECTED NOT DETECTED Final   Salmonella species NOT DETECTED NOT DETECTED Final   Yersinia enterocolitica NOT DETECTED NOT DETECTED Final   Vibrio species NOT DETECTED NOT DETECTED Final   Vibrio cholerae NOT DETECTED NOT DETECTED Final   Enteroaggregative E coli (EAEC) NOT DETECTED NOT DETECTED Final   Enteropathogenic E coli (EPEC) NOT DETECTED NOT DETECTED Final   Enterotoxigenic E coli (ETEC) NOT DETECTED NOT DETECTED Final   Shiga like toxin producing E coli (STEC) NOT DETECTED NOT DETECTED Final   Shigella/Enteroinvasive E coli (EIEC) NOT DETECTED NOT DETECTED Final   Cryptosporidium NOT DETECTED NOT DETECTED Final   Cyclospora cayetanensis NOT DETECTED NOT DETECTED Final   Entamoeba histolytica NOT DETECTED NOT DETECTED Final   Giardia lamblia NOT DETECTED NOT DETECTED Final   Adenovirus F40/41 NOT DETECTED NOT DETECTED Final   Astrovirus NOT DETECTED NOT DETECTED Final   Norovirus GI/GII NOT DETECTED NOT DETECTED Final   Rotavirus A NOT DETECTED NOT DETECTED Final   Sapovirus (I, II, IV, and V) NOT DETECTED NOT DETECTED Final    Comment: Performed at [facility], [address] [place], [place] [address]    Coagulation Studies: No results for input(s): LABPROT, INR in the last 72 hours.  Urinalysis: No results for input(s):  COLORURINE, LABSPEC, PHURINE, GLUCOSEU, HGBUR, BILIRUBINUR, KETONESUR, PROTEINUR, UROBILINOGEN, NITRITE, LEUKOCYTESUR in the last 168 hours.  Invalid input(s): APPERANCEUR  Lipid Panel:     Component Value Date/Time   CHOL 266 (H) 05/26/2017 1116   TRIG 394 (H) 05/26/2017 1116   HDL 40 (L) 05/26/2017 1116   CHOLHDL 6.7 05/26/2017 1116   VLDL 79 (H) 05/26/2017 1116   LDLCALC 147 (H) 05/26/2017 1116    HgbA1C:  Lab Results  Component Value Date   HGBA1C 5.4 06/11/2019    Urine Drug Screen:  No results found for: LABOPIA, COCAINSCRNUR, LABBENZ, AMPHETMU, THCU, LABBARB  Alcohol Level: No results for input(s): ETH in the last 168 hours.  Other results: EKG: sinus rhythm at 98 bpm.  Imaging: MR BRAIN WO CONTRAST  Result Date: 10/26/2019 CLINICAL DATA:  Headache, acute. EXAM: MRI HEAD WITHOUT CONTRAST TECHNIQUE: Multiplanar, multiecho pulse sequences of the brain and surrounding structures were obtained without intravenous contrast. COMPARISON:  Head CT October 21, 2019 FINDINGS: Most of the study is severely degraded by motion. Brain: No acute infarction, hemorrhage, hydrocephalus, extra-axial collection or mass lesion. Single focus of increased T2 signal seen in the white matter of the anterior right temporal (series 10, image 9). Vascular: Normal flow voids. Skull and upper cervical spine: Normal marrow signal. Sinuses/Orbits: Negative. Other: None. IMPRESSION: 1. Most of the study is severely degraded  by motion artifact. 2. No acute intracranial abnormality. 3. Single focus of increased T2 signal in the white matter of the anterior right temporal lobe. This is a nonspecific finding, but may be seen in the setting of migraine headaches, post inflammatory/infectious processes and others. Electronically Signed   By: Pedro Earls M.D.   On: 10/26/2019 14:54     Assessment/Plan: 34 year old female with a history of hypertension, dyslipidemia and anxiety who was admitted with  diarrhea.  Has now developed left facial pain and diplopia due to left sixth nerve palsy noted on examination.  Patient has had a MRI of the brain that was personally reviewed and show a small T2 anterior right temporal lobe focus but no other abnormalities.  This is not the cause of the patient's complaints complaints and is likely incidental.  Facial pain very characteristic of trigeminal neuralgia which it seems she has had intermittently on the right in the past.  Now present on the left.  Patient with a history of multiple episode of shingles and suspect this is related to the same.  Diplopia may very well be secondary to head injury.  Patient describes head injury prior to admission.  Has had multiple head injuries/conussions in the past.  Will rule out other possibilities as well.  Recommendations: 1. Would change Neurontin from prn dosing to 360m BID.  First dose tonight. 2. Start Tegretol 2020mBID.  First dose tonight. 3. CRP, ESR, B1, TSH, ACE, lyme titer, myasthenia panel, A1c  LeAlexis GoodellMD Neurology [id number]/01/2020, 6:56 PM

## 2019-10-26 NOTE — Progress Notes (Signed)
Patient ID: Molly Cisneros, female   DOB: Feb 03, 1986, 34 y.o.   MRN: [id number] [facility] PROGRESS NOTE  Molly Cisneros [id number]:[id number] DOB: 1985-07-14 DOA: 10/15/2019 PCP: Glean Hess, MD  HPI/Subjective: Patient complains of headache around her left eye with some blurred vision.  This headache has been going on the entire time she has been in the hospital.  Also having diarrhea but that seems to have slowed down a little bit.  Objective: Vitals:   10/25/19 2033 10/26/19 0538  BP: (!) 138/95 (!) 134/98  Pulse: (!) 110 100  Resp: 16 16  Temp: 98.3 F (36.8 C) 97.9 F (36.6 C)  SpO2: 97% 96%    Intake/Output Summary (Last 24 hours) at 10/26/2019 1520 Last data filed at 10/26/2019 1431 Gross per 24 hour  Intake 480 ml  Output --  Net 480 ml   Filed Weights   10/15/19 0638 10/15/19 2104  Weight: 97.7 kg 101 kg    ROS: Review of Systems  Constitutional: Negative for fever.  Eyes: Negative for blurred vision.  Respiratory: Negative for cough and shortness of breath.   Cardiovascular: Negative for chest pain.  Gastrointestinal: Negative for abdominal pain, nausea and vomiting.  Genitourinary: Negative for dysuria.  Musculoskeletal: Negative for joint pain.  Neurological: Positive for headaches.   Exam: Physical Exam  Constitutional: She is oriented to person, place, and time.  HENT:  Nose: No mucosal edema.  Mouth/Throat: No oropharyngeal exudate or posterior oropharyngeal edema.  Eyes: Conjunctivae and lids are normal.  Neck: Carotid bruit is not present.  Cardiovascular: S1 normal and S2 normal. Exam reveals no gallop.  No murmur heard. Respiratory: No respiratory distress. She has no wheezes. She has no rhonchi. She has no rales.  GI: Soft. Bowel sounds are normal. There is abdominal tenderness.  Musculoskeletal:     Right ankle: No swelling.     Left ankle: No swelling.  Lymphadenopathy:    She has no cervical adenopathy.  Neurological: She is alert and  oriented to person, place, and time. No cranial nerve deficit.  Skin: Skin is warm. No rash noted. Nails show no clubbing.  Psychiatric: She has a normal mood and affect.      Data Reviewed: Basic Metabolic Panel: Recent Labs  Lab 10/20/19 0808 10/20/19 0174 10/21/19 0517 10/21/19 0517 10/22/19 0553 10/23/19 0625 10/24/19 0610 10/25/19 0456 10/26/19 0633  NA 141  --  139  --  140 141 140  --   --   K 5.3*  --  4.5  --  4.3 4.4 3.6  --   --   CL 114*  --  111  --  111 111 108  --   --   CO2 22  --  23  --  23 24 25   --   --   GLUCOSE 115*  --  91  --  112* 107* 86  --   --   BUN 11  --  9  --  9 8 9   --   --   CREATININE 1.11*  --  1.14*  --  1.02* 1.06* 1.05*  --   --   CALCIUM 8.7*  --  8.4*  --  8.5* 8.6* 8.9  --   --   MG 1.6*   < > 1.9   < > 1.5* 1.7 1.6* 1.8 1.8   < > = values in this interval not displayed.     Studies: MR BRAIN WO CONTRAST  Result Date:  10/26/2019 CLINICAL DATA:  Headache, acute. EXAM: MRI HEAD WITHOUT CONTRAST TECHNIQUE: Multiplanar, multiecho pulse sequences of the brain and surrounding structures were obtained without intravenous contrast. COMPARISON:  Head CT October 21, 2019 FINDINGS: Most of the study is severely degraded by motion. Brain: No acute infarction, hemorrhage, hydrocephalus, extra-axial collection or mass lesion. Single focus of increased T2 signal seen in the white matter of the anterior right temporal (series 10, image 9). Vascular: Normal flow voids. Skull and upper cervical spine: Normal marrow signal. Sinuses/Orbits: Negative. Other: None. IMPRESSION: 1. Most of the study is severely degraded by motion artifact. 2. No acute intracranial abnormality. 3. Single focus of increased T2 signal in the white matter of the anterior right temporal lobe. This is a nonspecific finding, but may be seen in the setting of migraine headaches, post inflammatory/infectious processes and others. Electronically Signed   By: Pedro Earls M.D.    On: 10/26/2019 14:54    Scheduled Meds: . amLODipine  10 mg Oral Daily  . amphetamine-dextroamphetamine  30 mg Oral Daily  . atorvastatin  20 mg Oral Daily  . B-complex with vitamin C  1 tablet Oral Daily  . cholecalciferol  1,000 Units Oral Daily  . cholestyramine  4 g Oral TID  . cyclobenzaprine  5 mg Oral TID  . dicyclomine  20 mg Oral TID AC  . DULoxetine  60 mg Oral Daily  . enoxaparin (LOVENOX) injection  40 mg Subcutaneous Q24H  . feeding supplement (ENSURE ENLIVE)  237 mL Oral BID BM  . folic acid  1 mg Oral Daily  . lipase/protease/amylase  24,000 Units Oral TID AC  . octreotide  100 mcg Subcutaneous Q8H  . pantoprazole  40 mg Oral BID AC  . sodium chloride flush  3 mL Intravenous Q12H  . valACYclovir  1,000 mg Oral TID  . cyanocobalamin  1,000 mcg Oral Daily   Assessment/Plan:  1. Headache with left eye blurred vision.  This has been going on since the entire hospital stay.  Blurred vision left eye.  I tried magnesium, Decadron, Depakote and Toradol today without much response.  Will get neurology consultation.  MRI did show increased T2 signal in the white matter anterior right temporal lobe which is not where her headache is. 2. Acute on chronic diarrhea with abdominal pain.  As needed Imodium.  Patient on Bentyl and octreotide already.  Previous physician started a trial of Creon to see if that helps.  Will start cholestyramine. 3. Sepsis ruled out 4. Acute kidney injury.  This has improved. 5. Hypokalemia and hypomagnesemia have improved    Code Status:     Code Status Orders  (From admission, onward)         Start     Ordered   10/15/19 0932  Full code  Continuous     10/15/19 0935        Code Status History    Date Active Date Inactive Code Status Order ID Comments User Context   07/01/2019 1836 07/11/2019 1738 Full Code [id number]  Ivor Costa, MD Inpatient   06/11/2019 1431 06/16/2019 2206 Full Code [id number]  Para Skeans, MD ED   02/13/2019 0034  02/18/2019 1741 Full Code [id number]  Mansy, Arvella Merles, MD ED   02/05/2019 2230 02/09/2019 1747 Full Code [id number]  Lance Coon, MD Inpatient   11/06/2018 1742 11/06/2018 2253 Full Code [id number]  Herbert Pun, MD ED   Advance Care Planning Activity     Disposition Plan: Status  is: Inpatient  Dispo: The patient is from: Home              Anticipated d/c is to: Home              Anticipated d/c date is: Evaluate on a daily basis on when to go home.  Would like to have headache decreased prior to disposition.              Patient currently still having headache throughout the entire hospital course.  Unresponsive to magnesium, Toradol, Decadron and Depakote today.  We will get neurology consultation.  Consultants:  Neurology  Time spent: 28 minutes  [place]

## 2019-10-27 DIAGNOSIS — R55 Syncope and collapse: Secondary | ICD-10-CM

## 2019-10-27 LAB — BASIC METABOLIC PANEL
Anion gap: 8 (ref 5–15)
BUN: 19 mg/dL (ref 6–20)
CO2: 24 mmol/L (ref 22–32)
Calcium: 9.2 mg/dL (ref 8.9–10.3)
Chloride: 107 mmol/L (ref 98–111)
Creatinine, Ser: 0.97 mg/dL (ref 0.44–1.00)
GFR calc Af Amer: >60 mL/min (ref >60)
GFR calc non Af Amer: >60 mL/min (ref >60)
Glucose, Bld: 141 mg/dL — ABNORMAL HIGH (ref 70–99)
Potassium: 4.4 mmol/L (ref 3.5–5.1)
Sodium: 139 mmol/L (ref 135–145)

## 2019-10-27 LAB — HEMOGLOBIN A1C
Hgb A1c MFr Bld: 5.7 % — ABNORMAL HIGH (ref 4.8–5.6)
Mean Plasma Glucose: 116.89 mg/dL

## 2019-10-27 LAB — MAGNESIUM: Magnesium: 2 mg/dL (ref 1.7–2.4)

## 2019-10-27 NOTE — Progress Notes (Signed)
Subjective: Patient reports headache improved this morning.  Rates pain at a 6/10.  Shooting pain episodes have decreased.  Only had 4 overnight as opposed to hourly.  Able to touch face.  Continued diplopia.    Objective: Current vital signs: BP (!) 132/103 (BP Location: Right Arm)   Pulse (!) 105   Temp 98.8 F (37.1 C) (Oral)   Resp 17   Ht 6' (1.829 m)   Wt 101 kg   SpO2 98%   BMI 30.20 kg/m  Vital signs in last 24 hours: Temp:  [98.1 F (36.7 C)-98.8 F (37.1 C)] 98.8 F (37.1 C) (06/10 1035) Pulse Rate:  [105-120] 105 (06/10 1035) Resp:  [14-17] 17 (06/10 1035) BP: (113-138)/(76-103) 132/103 (06/10 1035) SpO2:  [95 %-98 %] 98 % (06/10 1035)  Intake/Output from previous day: 06/09 0701 - 06/10 0700 In: 840 [P.O.:840] Out: -  Intake/Output this shift: Total I/O In: 240 [P.O.:240] Out: -  Nutritional status:  Diet Order            Diet regular Room service appropriate? Yes; Fluid consistency: Thin  Diet effective now                 Neurologic Exam: Mental Status: Alert, oriented, thought content appropriate.  Speech fluent without evidence of aphasia.  Able to follow 3 step commands without difficulty. Cranial Nerves: II: Discs flat bilaterally; Visual fields grossly normal, pupils dilated, equal, round, reactive to light and accommodation III,IV, VI: ptosis not present, weak left lateral rectus with esotropia of left eye and nystagmus on left lateral gaze V,VII: smile symmetric, facial light touch sensation hyperesthetic VIII: hearing normal bilaterally IX,X: gag reflex present XI: bilateral shoulder shrug XII: midline tongue extension Motor: Right :  Upper extremity   5/5                                      Left:     Upper extremity   5/5             Lower extremity   5/5                                                  Lower extremity   5/5 Tone and bulk:normal tone throughout; no atrophy noted Sensory: Pinprick and light touch intact throughout,  bilaterally  Lab Results: Basic Metabolic Panel: Recent Labs  Lab 10/21/19 0517 10/21/19 0517 10/22/19 0553 10/22/19 0553 10/23/19 0625 10/24/19 0610 10/25/19 0456 10/26/19 0633 10/27/19 0606  NA 139  --  140  --  141 140  --   --  139  K 4.5  --  4.3  --  4.4 3.6  --   --  4.4  CL 111  --  111  --  111 108  --   --  107  CO2 23  --  23  --  24 25  --   --  24  GLUCOSE 91  --  112*  --  107* 86  --   --  141*  BUN 9  --  9  --  8 9  --   --  19  CREATININE 1.14*  --  1.02*  --  1.06* 1.05*  --   --  0.97  CALCIUM 8.4*   < > 8.5*   < > 8.6* 8.9  --   --  9.2  MG 1.9   < > 1.5*   < > 1.7 1.6* 1.8 1.8 2.0   < > = values in this interval not displayed.    Liver Function Tests: No results for input(s): AST, ALT, ALKPHOS, BILITOT, PROT, ALBUMIN in the last 168 hours. No results for input(s): LIPASE, AMYLASE in the last 168 hours. No results for input(s): AMMONIA in the last 168 hours.  CBC: No results for input(s): WBC, NEUTROABS, HGB, HCT, MCV, PLT in the last 168 hours.  Cardiac Enzymes: No results for input(s): CKTOTAL, CKMB, CKMBINDEX, TROPONINI in the last 168 hours.  Lipid Panel: No results for input(s): CHOL, TRIG, HDL, CHOLHDL, VLDL, LDLCALC in the last 168 hours.  CBG: No results for input(s): GLUCAP in the last 168 hours.  Microbiology: Results for orders placed or performed during the hospital encounter of 10/15/19  SARS Coronavirus 2 by RT PCR (hospital order, performed in [facility] hospital lab) Nasopharyngeal Nasopharyngeal Swab     Status: None   Collection Time: 10/15/19  7:24 AM   Specimen: Nasopharyngeal Swab  Result Value Ref Range Status   SARS Coronavirus 2 NEGATIVE NEGATIVE Final    Comment: (NOTE) SARS-CoV-2 target nucleic acids are NOT DETECTED. The SARS-CoV-2 RNA is generally detectable in upper and lower respiratory specimens during the acute phase of infection. The lowest concentration of SARS-CoV-2 viral copies this assay can detect is  250 copies / mL. A negative result does not preclude SARS-CoV-2 infection and should not be used as the sole basis for treatment or other patient management decisions.  A negative result may occur with improper specimen collection / handling, submission of specimen other than nasopharyngeal swab, presence of viral mutation(s) within the areas targeted by this assay, and inadequate number of viral copies (<250 copies / mL). A negative result must be combined with clinical observations, patient history, and epidemiological information. Fact Sheet for Patients:   StrictlyIdeas.no Fact Sheet for Healthcare Providers: BankingDealers.co.za This test is not yet approved or cleared  by the [place] FDA and has been authorized for detection and/or diagnosis of SARS-CoV-2 by FDA under an Emergency Use Authorization (EUA).  This EUA will remain in effect (meaning this test can be used) for the duration of the COVID-19 declaration under Section 564(b)(1) of the Act, 21 U.S.C. section 360bbb-3(b)(1), unless the authorization is terminated or revoked sooner. Performed at [facility], [place], [place] [address]   C Difficile Quick Screen w PCR reflex     Status: None   Collection Time: 10/15/19  9:15 AM   Specimen: Urine, Clean Catch; Stool  Result Value Ref Range Status   C Diff antigen NEGATIVE NEGATIVE Final   C Diff toxin NEGATIVE NEGATIVE Final   C Diff interpretation No C. difficile detected.  Final    Comment: Performed at [facility], [address] [place], [place] [address]  Gastrointestinal Panel by PCR , Stool     Status: None   Collection Time: 10/15/19  9:15 AM   Specimen: Urine, Clean Catch; Stool  Result Value Ref Range Status   Campylobacter species NOT DETECTED NOT DETECTED Final   Plesimonas shigelloides NOT DETECTED NOT DETECTED Final   Salmonella species NOT DETECTED NOT DETECTED Final    Yersinia enterocolitica NOT DETECTED NOT DETECTED Final   Vibrio species NOT DETECTED NOT DETECTED Final  Vibrio cholerae NOT DETECTED NOT DETECTED Final   Enteroaggregative E coli (EAEC) NOT DETECTED NOT DETECTED Final   Enteropathogenic E coli (EPEC) NOT DETECTED NOT DETECTED Final   Enterotoxigenic E coli (ETEC) NOT DETECTED NOT DETECTED Final   Shiga like toxin producing E coli (STEC) NOT DETECTED NOT DETECTED Final   Shigella/Enteroinvasive E coli (EIEC) NOT DETECTED NOT DETECTED Final   Cryptosporidium NOT DETECTED NOT DETECTED Final   Cyclospora cayetanensis NOT DETECTED NOT DETECTED Final   Entamoeba histolytica NOT DETECTED NOT DETECTED Final   Giardia lamblia NOT DETECTED NOT DETECTED Final   Adenovirus F40/41 NOT DETECTED NOT DETECTED Final   Astrovirus NOT DETECTED NOT DETECTED Final   Norovirus GI/GII NOT DETECTED NOT DETECTED Final   Rotavirus A NOT DETECTED NOT DETECTED Final   Sapovirus (I, II, IV, and V) NOT DETECTED NOT DETECTED Final    Comment: Performed at [facility], [address] [place], [place] [address]    Coagulation Studies: No results for input(s): LABPROT, INR in the last 72 hours.  Imaging: MR BRAIN WO CONTRAST  Result Date: 10/26/2019 CLINICAL DATA:  Headache, acute. EXAM: MRI HEAD WITHOUT CONTRAST TECHNIQUE: Multiplanar, multiecho pulse sequences of the brain and surrounding structures were obtained without intravenous contrast. COMPARISON:  Head CT October 21, 2019 FINDINGS: Most of the study is severely degraded by motion. Brain: No acute infarction, hemorrhage, hydrocephalus, extra-axial collection or mass lesion. Single focus of increased T2 signal seen in the white matter of the anterior right temporal (series 10, image 9). Vascular: Normal flow voids. Skull and upper cervical spine: Normal marrow signal. Sinuses/Orbits: Negative. Other: None. IMPRESSION: 1. Most of the study is severely degraded by motion artifact. 2. No acute intracranial  abnormality. 3. Single focus of increased T2 signal in the white matter of the anterior right temporal lobe. This is a nonspecific finding, but may be seen in the setting of migraine headaches, post inflammatory/infectious processes and others. Electronically Signed   By: Pedro Earls M.D.   On: 10/26/2019 14:54    Medications:  I have reviewed the patient's current medications. Scheduled: . amLODipine  10 mg Oral Daily  . amphetamine-dextroamphetamine  30 mg Oral Daily  . atorvastatin  20 mg Oral Daily  . B-complex with vitamin C  1 tablet Oral Daily  . carbamazepine  200 mg Oral BID  . cholecalciferol  1,000 Units Oral Daily  . cholestyramine  4 g Oral TID  . cyclobenzaprine  5 mg Oral TID  . dicyclomine  20 mg Oral TID AC  . DULoxetine  60 mg Oral Daily  . enoxaparin (LOVENOX) injection  40 mg Subcutaneous Q24H  . feeding supplement (ENSURE ENLIVE)  237 mL Oral BID BM  . folic acid  1 mg Oral Daily  . gabapentin  300 mg Oral BID  . lipase/protease/amylase  72,000 Units Oral TID AC  . octreotide  100 mcg Subcutaneous Q8H  . pantoprazole  40 mg Oral BID AC  . sodium chloride flush  3 mL Intravenous Q12H  . valACYclovir  1,000 mg Oral TID  . cyanocobalamin  1,000 mcg Oral Daily    Assessment/Plan: 34 year old female with a history of hypertension, dyslipidemia and anxiety who was admitted with diarrhea.  Has now developed left facial pain and diplopia due to left sixth nerve palsy noted on examination.  Patient has had a MRI of the brain that was personally reviewed and show a small T2 anterior right temporal lobe focus  but no other abnormalities.  This is not the cause of the patient's complaints complaints and is likely incidental.  Facial pain very characteristic of trigeminal neuralgia which it seems she has had intermittently on the right in the past.  Now present on the left.  Patient with a history of multiple episode of shingles and suspect this is related to the  same.  Diplopia may very well be secondary to head injury.  Patient describes head injury prior to admission.  Has had multiple head injuries/conussions in the past.  Pain improving.  Diplopia continues.  Tolerating medications without incident.   TSH, ESR and A1c are normal.  Remaining bloodwork is pending.     Recommendations: 1. Continue Neurontin and Tegretol at current doses.     LOS: 12 days   Molly Goodell, MD Neurology [phone] 10/27/2019  12:28 PM

## 2019-10-27 NOTE — Progress Notes (Signed)
Patient ID: Molly Cisneros, female   DOB: 1986/03/18, 34 y.o.   MRN: [id number]  [facility] PROGRESS NOTE  Molly Cisneros LZJ:[id number] DOB: 06-Aug-1985 DOA: 10/15/2019 PCP: Glean Hess, MD  HPI/Subjective: Patient feeling little bit better.  Had 2 episodes of diarrhea last night.  Still having headache 5 out of 10 in intensity.  Objective: Vitals:   10/27/19 1421 10/27/19 1509  BP: 118/83   Pulse: (!) 127 (!) 110  Resp: 18   Temp: 97.8 F (36.6 C) 98 F (36.7 C)  SpO2: 96% 96%    Intake/Output Summary (Last 24 hours) at 10/27/2019 1540 Last data filed at 10/27/2019 1411 Gross per 24 hour  Intake 840 ml  Output --  Net 840 ml   Filed Weights   10/15/19 0638 10/15/19 2104  Weight: 97.7 kg 101 kg    ROS: Review of Systems  Constitutional: Negative for fever.  Eyes: Negative for blurred vision.  Respiratory: Negative for cough and shortness of breath.   Cardiovascular: Negative for chest pain.  Gastrointestinal: Positive for diarrhea. Negative for abdominal pain, nausea and vomiting.  Genitourinary: Negative for dysuria.  Musculoskeletal: Negative for joint pain.  Neurological: Positive for headaches.   Exam: Physical Exam  Constitutional: She is oriented to person, place, and time.  HENT:  Nose: No mucosal edema.  Mouth/Throat: No oropharyngeal exudate.  Eyes: Conjunctivae and lids are normal.  Neck: Carotid bruit is not present.  Cardiovascular: S1 normal and S2 normal. Exam reveals no gallop.  No murmur heard. Respiratory: No respiratory distress. She has no wheezes. She has no rhonchi. She has no rales.  GI: Soft. There is no abdominal tenderness.  Musculoskeletal:     Right ankle: No swelling.     Left ankle: No swelling.  Lymphadenopathy:    She has no cervical adenopathy.  Neurological: She is alert and oriented to person, place, and time. No cranial nerve deficit.  Skin: Skin is warm. No rash noted. Nails show no clubbing.      Data  Reviewed: Basic Metabolic Panel: Recent Labs  Lab 10/21/19 0517 10/21/19 0517 10/22/19 0553 10/22/19 0553 10/23/19 0625 10/24/19 0610 10/25/19 0456 10/26/19 0633 10/27/19 0606  NA 139  --  140  --  141 140  --   --  139  K 4.5  --  4.3  --  4.4 3.6  --   --  4.4  CL 111  --  111  --  111 108  --   --  107  CO2 23  --  23  --  24 25  --   --  24  GLUCOSE 91  --  112*  --  107* 86  --   --  141*  BUN 9  --  9  --  8 9  --   --  19  CREATININE 1.14*  --  1.02*  --  1.06* 1.05*  --   --  0.97  CALCIUM 8.4*  --  8.5*  --  8.6* 8.9  --   --  9.2  MG 1.9   < > 1.5*   < > 1.7 1.6* 1.8 1.8 2.0   < > = values in this interval not displayed.     Studies: MR BRAIN WO CONTRAST  Result Date: 10/26/2019 CLINICAL DATA:  Headache, acute. EXAM: MRI HEAD WITHOUT CONTRAST TECHNIQUE: Multiplanar, multiecho pulse sequences of the brain and surrounding structures were obtained without intravenous contrast. COMPARISON:  Head CT October 21, 2019 FINDINGS: Most of the study is severely degraded by motion. Brain: No acute infarction, hemorrhage, hydrocephalus, extra-axial collection or mass lesion. Single focus of increased T2 signal seen in the white matter of the anterior right temporal (series 10, image 9). Vascular: Normal flow voids. Skull and upper cervical spine: Normal marrow signal. Sinuses/Orbits: Negative. Other: None. IMPRESSION: 1. Most of the study is severely degraded by motion artifact. 2. No acute intracranial abnormality. 3. Single focus of increased T2 signal in the white matter of the anterior right temporal lobe. This is a nonspecific finding, but may be seen in the setting of migraine headaches, post inflammatory/infectious processes and others. Electronically Signed   By: Pedro Earls M.D.   On: 10/26/2019 14:54    Scheduled Meds: . amLODipine  10 mg Oral Daily  . amphetamine-dextroamphetamine  30 mg Oral Daily  . atorvastatin  20 mg Oral Daily  . B-complex with vitamin C   1 tablet Oral Daily  . carbamazepine  200 mg Oral BID  . cholecalciferol  1,000 Units Oral Daily  . cholestyramine  4 g Oral TID  . cyclobenzaprine  5 mg Oral TID  . dicyclomine  20 mg Oral TID AC  . DULoxetine  60 mg Oral Daily  . enoxaparin (LOVENOX) injection  40 mg Subcutaneous Q24H  . feeding supplement (ENSURE ENLIVE)  237 mL Oral BID BM  . folic acid  1 mg Oral Daily  . gabapentin  300 mg Oral BID  . lipase/protease/amylase  72,000 Units Oral TID AC  . octreotide  100 mcg Subcutaneous Q8H  . pantoprazole  40 mg Oral BID AC  . sodium chloride flush  3 mL Intravenous Q12H  . valACYclovir  1,000 mg Oral TID  . cyanocobalamin  1,000 mcg Oral Daily   Assessment/Plan:  1. Headache with left eye blurred vision.  Patient also has some pain in the left TMJ joint.  Patient started on Tegretol 200 mg twice daily and Neurontin standing dose twice daily.  Neurology wanted to watch overnight on these medications and potential disposition tomorrow. 2. Acute on chronic diarrhea with abdominal pain.  As needed Imodium.  Patient on Bentyl and octreotide already.  Previous physician started a trial of Creon to see if that helps.  Continue cholestyramine. 3. Sepsis ruled out 4. Acute kidney injury.  This has improved. 5. Hypokalemia and hypomagnesemia have improved    Code Status:     Code Status Orders  (From admission, onward)         Start     Ordered   10/15/19 0932  Full code  Continuous     10/15/19 0935        Code Status History    Date Active Date Inactive Code Status Order ID Comments User Context   07/01/2019 1836 07/11/2019 1738 Full Code [id number]  Ivor Costa, MD Inpatient   06/11/2019 1431 06/16/2019 2206 Full Code [id number]  Para Skeans, MD ED   02/13/2019 0034 02/18/2019 1741 Full Code [id number]  Mansy, Arvella Merles, MD ED   02/05/2019 2230 02/09/2019 1747 Full Code [id number]  Lance Coon, MD Inpatient   11/06/2018 1742 11/06/2018 2253 Full Code [id number]  Herbert Pun,  MD ED   Advance Care Planning Activity     Disposition Plan: Status is: Inpatient  Dispo: The patient is from: Home              Anticipated d/c is to: Home  Anticipated d/c date is: 10/28/2019              Patient currently still having headache throughout the entire hospital course.  Neurology wanted to watch another day in the hospital on newly started Tegretol and standing dose of Neurontin.  Consultants:  Neurology  Time spent: 27 minutes.  Case discussed with neurology.  [place]  [facility] [employer]

## 2019-10-28 LAB — B. BURGDORFI ANTIBODIES: B burgdorferi Ab IgG+IgM: <0.91 {ISR} (ref 0.00–0.90)

## 2019-10-28 LAB — ANGIOTENSIN CONVERTING ENZYME: Angiotensin-Converting Enzyme: 16 U/L (ref 14–82)

## 2019-10-28 MED ORDER — AMLODIPINE BESYLATE 10 MG PO TABS: 10.0000 mg | ORAL_TABLET | Freq: Every day | ORAL | 0 refills | Status: DC

## 2019-10-28 MED ORDER — PROMETHAZINE HCL 25 MG PO TABS: 25.0000 mg | ORAL_TABLET | Freq: Three times a day (TID) | ORAL | 0 refills | Status: DC | PRN

## 2019-10-28 MED ORDER — CARBAMAZEPINE 200 MG PO TABS: 200.0000 mg | ORAL_TABLET | Freq: Two times a day (BID) | ORAL | 0 refills | Status: DC

## 2019-10-28 MED ORDER — GABAPENTIN 300 MG PO CAPS: 300.0000 mg | ORAL_CAPSULE | Freq: Two times a day (BID) | ORAL | 0 refills | Status: DC

## 2019-10-28 MED ORDER — CHOLESTYRAMINE 4 G PO PACK: 4.0000 g | PACK | Freq: Three times a day (TID) | ORAL | 0 refills | Status: DC

## 2019-10-28 MED ORDER — LOPERAMIDE HCL 2 MG PO CAPS: 2.0000 mg | ORAL_CAPSULE | ORAL | 0 refills | Status: DC | PRN

## 2019-10-28 MED ORDER — BUTALBITAL-APAP-CAFFEINE 50-325-40 MG PO TABS: 1.0000 | ORAL_TABLET | Freq: Four times a day (QID) | ORAL | 0 refills | Status: DC | PRN

## 2019-10-28 MED ORDER — PANCRELIPASE (LIP-PROT-AMYL) 36000-114000 UNITS PO CPEP: 72000.0000 [IU] | ORAL_CAPSULE | Freq: Three times a day (TID) | ORAL | 0 refills | Status: DC

## 2019-10-28 NOTE — TOC Transition Note (Signed)
Transition of Care [facility]) - CM/SW Discharge Note   Patient Details  Name: SINDEE STUCKER MRN: [id number] Date of Birth: September 15, 1985  Transition of Care [facility]) CM/SW Contact:  Candie Chroman, LCSW Phone Number: 10/28/2019, 9:12 AM   Clinical Narrative: Patient has orders to discharge home today. No further concerns. CSW signing off.    Final next level of care: Home/Self Care Barriers to Discharge: Barriers Resolved   Patient Goals and CMS Choice     Choice offered to / list presented to : NA  Discharge Placement                    Patient and family notified of of transfer: 10/28/19  Discharge Plan and Services     Post Acute Care Choice: NA                               Social Determinants of Health (SDOH) Interventions     Readmission Risk Interventions Readmission Risk Prevention Plan 10/18/2019 07/05/2019 06/16/2019  Transportation Screening Complete Complete Complete  PCP or Specialist Appt within 3-5 Days Complete - Complete  [place]RI or [place] - - (No Data)  Social Work Consult for [place] Planning/Counseling Complete - -  Palliative Care Screening Not Applicable - Not Applicable  Medication Review Press photographer) Complete Complete Complete  PCP or Specialist appointment within 3-5 days of discharge - Complete -  [place] or [place] - Complete -  [place] - Not Applicable -  Some recent data might be hidden

## 2019-10-28 NOTE — Progress Notes (Signed)
Molly Cisneros  A and O x 4. VSS. Pt tolerating diet well. No complaints of pain or nausea. IV removed intact, prescriptions given. Pt voiced understanding of discharge instructions with no further questions. Pt discharged home.     Allergies as of 10/28/2019      Reactions   Shellfish Allergy Anaphylaxis   Aloe Vera Dermatitis      Medication List    STOP taking these medications   diphenoxylate-atropine 2.5-0.025 MG tablet Commonly known as: LOMOTIL   hydrOXYzine 25 MG capsule Commonly known as: VISTARIL   ondansetron 8 MG disintegrating tablet Commonly known as: ZOFRAN-ODT   traMADol 50 MG tablet Commonly known as: ULTRAM     TAKE these medications   ALPRAZolam 1 MG tablet Commonly known as: XANAX Take 1 mg by mouth 3 (three) times daily as needed for anxiety.   amLODipine 10 MG tablet Commonly known as: NORVASC Take 1 tablet (10 mg total) by mouth daily. Start taking on: October 29, 2019   amphetamine-dextroamphetamine 30 MG 24 hr capsule Commonly known as: ADDERALL XR Take 30 mg by mouth daily. What changed: Another medication with the same name was removed. Continue taking this medication, and follow the directions you see here.   atorvastatin 20 MG tablet Commonly known as: LIPITOR TAKE (1) TABLET BY MOUTH EVERY DAY What changed: See the new instructions.   B-complex with vitamin C tablet Take 1 tablet by mouth daily.   butalbital-acetaminophen-caffeine 50-325-40 MG tablet Commonly known as: FIORICET Take 1 tablet by mouth every 6 (six) hours as needed for headache or migraine.   carbamazepine 200 MG tablet Commonly known as: TEGRETOL Take 1 tablet (200 mg total) by mouth 2 (two) times daily.   cholestyramine 4 g packet Commonly known as: QUESTRAN Take 1 packet (4 g total) by mouth 3 (three) times daily.   cyanocobalamin 1000 MCG tablet Take 1 tablet (1,000 mcg total) by mouth daily.   cyclobenzaprine 5 MG tablet Commonly known as: FLEXERIL Take 1  tablet (5 mg total) by mouth 3 (three) times daily.   dicyclomine 20 MG tablet Commonly known as: BENTYL TAKE (1) TABLET BY MOUTH THREE TIMES A DAY BEFORE MEALS What changed: See the new instructions.   DULoxetine 60 MG capsule Commonly known as: CYMBALTA Take 60 mg by mouth daily.   etonogestrel-ethinyl estradiol 0.12-0.015 MG/24HR vaginal ring Commonly known as: NuvaRing USE AS DIRECTED   feeding supplement (ENSURE ENLIVE) Liqd Take 237 mLs by mouth 2 (two) times daily between meals.   folic acid 1 MG tablet Commonly known as: FOLVITE Take 1 tablet (1 mg total) by mouth daily.   gabapentin 300 MG capsule Commonly known as: NEURONTIN Take 1 capsule (300 mg total) by mouth 2 (two) times daily. What changed: See the new instructions.   lipase/protease/amylase 36000 UNITS Cpep capsule Commonly known as: CREON Take 2 capsules (72,000 Units total) by mouth 3 (three) times daily before meals.   loperamide 2 MG capsule Commonly known as: IMODIUM Take 1 capsule (2 mg total) by mouth as needed for diarrhea or loose stools.   Octreotide Acetate 200 MCG/ML Soln Inject 0.5 mLs (100 mcg total) as directed every 8 (eight) hours as needed (diarrhea).   pantoprazole 40 MG tablet Commonly known as: PROTONIX TAKE ONE TABLET BY MOUTH TWICE DAILY. 30MINUTES PRIOR TO MEALS What changed: See the new instructions.   potassium chloride 20 MEQ/15ML (10%) Soln Take 15 mLs (20 mEq total) by mouth 2 (two) times daily.  promethazine 25 MG tablet Commonly known as: PHENERGAN Take 1 tablet (25 mg total) by mouth every 8 (eight) hours as needed for nausea or vomiting.   TUBERCULIN SYR 1CC/27GX1/2" 27G X 1/2" 1 ML Misc Use for octreotide injection every 8 hours as needed for diarrhea   valACYclovir 1000 MG tablet Commonly known as: VALTREX Take 1,000 mg by mouth 2 (two) times daily as needed.       Vitals:   10/27/19 2006 10/28/19 0504  BP: 120/87 (!) 130/99  Pulse: (!) 105 97  Resp:     Temp: 98.7 F (37.1 C) 98.3 F (36.8 C)  SpO2: 94% 93%    Francesco Sor

## 2019-10-28 NOTE — Progress Notes (Signed)
Subjective: Patient reports continued improvement.  Continues to have some tingling on the left side of the face.  Diplopia improving.    Objective: Current vital signs: BP (!) 130/99 (BP Location: Right Arm)   Pulse 97   Temp 98.3 F (36.8 C) (Oral)   Resp 18   Ht 6' (1.829 m)   Wt 101 kg   SpO2 93%   BMI 30.20 kg/m  Vital signs in last 24 hours: Temp:  [97.8 F (36.6 C)-98.8 F (37.1 C)] 98.3 F (36.8 C) (06/11 0504) Pulse Rate:  [97-127] 97 (06/11 0504) Resp:  [17-18] 18 (06/10 1421) BP: (118-132)/(83-103) 130/99 (06/11 0504) SpO2:  [93 %-98 %] 93 % (06/11 0504)  Intake/Output from previous day: 06/10 0701 - 06/11 0700 In: 600 [P.O.:600] Out: -  Intake/Output this shift: No intake/output data recorded. Nutritional status:  Diet Order            Diet regular Room service appropriate? Yes; Fluid consistency: Thin  Diet effective now                 Neurologic Exam: Mental Status: Alert, oriented, thought content appropriate. Speech fluent without evidence of aphasia. Able to follow 3 step commands without difficulty. Cranial Nerves: II: Discs flat bilaterally; Visual fields grossly normal, pupilsdilated,equal, round, reactive to light and accommodation III,IV, VI: ptosis not present,improved EOMs V,VII: smile symmetric, facial light touch sensationhyperesthetic VIII: hearing normal bilaterally IX,X: gag reflex present XI: bilateral shoulder shrug XII: midline tongue extension Motor: Right :Upper extremity 5/5Left: Upper extremity 5/5 Lower extremity 5/5Lower extremity 5/5 Tone and bulk:normal tone throughout; no atrophy noted Sensory: Pinprick and light touch intact throughout, bilaterally  Lab Results: Basic Metabolic Panel: Recent Labs  Lab 10/22/19 0553 10/22/19 0553 10/23/19 0625 10/24/19 0610 10/25/19 0456 10/26/19 0633  10/27/19 0606  NA 140  --  141 140  --   --  139  K 4.3  --  4.4 3.6  --   --  4.4  CL 111  --  111 108  --   --  107  CO2 23  --  24 25  --   --  24  GLUCOSE 112*  --  107* 86  --   --  141*  BUN 9  --  8 9  --   --  19  CREATININE 1.02*  --  1.06* 1.05*  --   --  0.97  CALCIUM 8.5*   < > 8.6* 8.9  --   --  9.2  MG 1.5*   < > 1.7 1.6* 1.8 1.8 2.0   < > = values in this interval not displayed.    Liver Function Tests: No results for input(s): AST, ALT, ALKPHOS, BILITOT, PROT, ALBUMIN in the last 168 hours. No results for input(s): LIPASE, AMYLASE in the last 168 hours. No results for input(s): AMMONIA in the last 168 hours.  CBC: No results for input(s): WBC, NEUTROABS, HGB, HCT, MCV, PLT in the last 168 hours.  Cardiac Enzymes: No results for input(s): CKTOTAL, CKMB, CKMBINDEX, TROPONINI in the last 168 hours.  Lipid Panel: No results for input(s): CHOL, TRIG, HDL, CHOLHDL, VLDL, LDLCALC in the last 168 hours.  CBG: No results for input(s): GLUCAP in the last 168 hours.  Microbiology: Results for orders placed or performed during the hospital encounter of 10/15/19  SARS Coronavirus 2 by RT PCR (hospital order, performed in [facility] hospital lab) Nasopharyngeal Nasopharyngeal Swab     Status: None  Collection Time: 10/15/19  7:24 AM   Specimen: Nasopharyngeal Swab  Result Value Ref Range Status   SARS Coronavirus 2 NEGATIVE NEGATIVE Final    Comment: (NOTE) SARS-CoV-2 target nucleic acids are NOT DETECTED. The SARS-CoV-2 RNA is generally detectable in upper and lower respiratory specimens during the acute phase of infection. The lowest concentration of SARS-CoV-2 viral copies this assay can detect is 250 copies / mL. A negative result does not preclude SARS-CoV-2 infection and should not be used as the sole basis for treatment or other patient management decisions.  A negative result may occur with improper specimen collection / handling, submission of specimen  other than nasopharyngeal swab, presence of viral mutation(s) within the areas targeted by this assay, and inadequate number of viral copies (<250 copies / mL). A negative result must be combined with clinical observations, patient history, and epidemiological information. Fact Sheet for Patients:   StrictlyIdeas.no Fact Sheet for Healthcare Providers: BankingDealers.co.za This test is not yet approved or cleared  by the [place] FDA and has been authorized for detection and/or diagnosis of SARS-CoV-2 by FDA under an Emergency Use Authorization (EUA).  This EUA will remain in effect (meaning this test can be used) for the duration of the COVID-19 declaration under Section 564(b)(1) of the Act, 21 U.S.C. section 360bbb-3(b)(1), unless the authorization is terminated or revoked sooner. Performed at [facility], [place], [place] [address]   C Difficile Quick Screen w PCR reflex     Status: None   Collection Time: 10/15/19  9:15 AM   Specimen: Urine, Clean Catch; Stool  Result Value Ref Range Status   C Diff antigen NEGATIVE NEGATIVE Final   C Diff toxin NEGATIVE NEGATIVE Final   C Diff interpretation No C. difficile detected.  Final    Comment: Performed at [facility], [address] [place], [place] [address]  Gastrointestinal Panel by PCR , Stool     Status: None   Collection Time: 10/15/19  9:15 AM   Specimen: Urine, Clean Catch; Stool  Result Value Ref Range Status   Campylobacter species NOT DETECTED NOT DETECTED Final   Plesimonas shigelloides NOT DETECTED NOT DETECTED Final   Salmonella species NOT DETECTED NOT DETECTED Final   Yersinia enterocolitica NOT DETECTED NOT DETECTED Final   Vibrio species NOT DETECTED NOT DETECTED Final   Vibrio cholerae NOT DETECTED NOT DETECTED Final   Enteroaggregative E coli (EAEC) NOT DETECTED NOT DETECTED Final   Enteropathogenic E coli (EPEC) NOT  DETECTED NOT DETECTED Final   Enterotoxigenic E coli (ETEC) NOT DETECTED NOT DETECTED Final   Shiga like toxin producing E coli (STEC) NOT DETECTED NOT DETECTED Final   Shigella/Enteroinvasive E coli (EIEC) NOT DETECTED NOT DETECTED Final   Cryptosporidium NOT DETECTED NOT DETECTED Final   Cyclospora cayetanensis NOT DETECTED NOT DETECTED Final   Entamoeba histolytica NOT DETECTED NOT DETECTED Final   Giardia lamblia NOT DETECTED NOT DETECTED Final   Adenovirus F40/41 NOT DETECTED NOT DETECTED Final   Astrovirus NOT DETECTED NOT DETECTED Final   Norovirus GI/GII NOT DETECTED NOT DETECTED Final   Rotavirus A NOT DETECTED NOT DETECTED Final   Sapovirus (I, II, IV, and V) NOT DETECTED NOT DETECTED Final    Comment: Performed at [facility], [address] [place],  [address]    Coagulation Studies: No results for input(s): LABPROT, INR in the last 72 hours.  Imaging: MR BRAIN WO CONTRAST  Result Date: 10/26/2019 CLINICAL DATA:  Headache,  acute. EXAM: MRI HEAD WITHOUT CONTRAST TECHNIQUE: Multiplanar, multiecho pulse sequences of the brain and surrounding structures were obtained without intravenous contrast. COMPARISON:  Head CT October 21, 2019 FINDINGS: Most of the study is severely degraded by motion. Brain: No acute infarction, hemorrhage, hydrocephalus, extra-axial collection or mass lesion. Single focus of increased T2 signal seen in the white matter of the anterior right temporal (series 10, image 9). Vascular: Normal flow voids. Skull and upper cervical spine: Normal marrow signal. Sinuses/Orbits: Negative. Other: None. IMPRESSION: 1. Most of the study is severely degraded by motion artifact. 2. No acute intracranial abnormality. 3. Single focus of increased T2 signal in the white matter of the anterior right temporal lobe. This is a nonspecific finding, but may be seen in the setting of migraine headaches, post inflammatory/infectious processes and others. Electronically  Signed   By: Pedro Earls M.D.   On: 10/26/2019 14:54    Medications:  I have reviewed the patient's current medications. Scheduled: . amLODipine  10 mg Oral Daily  . amphetamine-dextroamphetamine  30 mg Oral Daily  . atorvastatin  20 mg Oral Daily  . B-complex with vitamin C  1 tablet Oral Daily  . carbamazepine  200 mg Oral BID  . cholecalciferol  1,000 Units Oral Daily  . cholestyramine  4 g Oral TID  . cyclobenzaprine  5 mg Oral TID  . dicyclomine  20 mg Oral TID AC  . DULoxetine  60 mg Oral Daily  . enoxaparin (LOVENOX) injection  40 mg Subcutaneous Q24H  . feeding supplement (ENSURE ENLIVE)  237 mL Oral BID BM  . folic acid  1 mg Oral Daily  . gabapentin  300 mg Oral BID  . lipase/protease/amylase  72,000 Units Oral TID AC  . octreotide  100 mcg Subcutaneous Q8H  . pantoprazole  40 mg Oral BID AC  . sodium chloride flush  3 mL Intravenous Q12H  . valACYclovir  1,000 mg Oral TID  . cyanocobalamin  1,000 mcg Oral Daily    Assessment/Plan: 34 year old female with a history ofhypertension, dyslipidemia and anxiety whowas admitted with diarrhea. Has now developed left facial pain and diplopia due to left sixth nerve palsy noted on examination. Patient has had a MRI of the brain that was personally reviewed and show a small T2 anterior right temporal lobe focus but no other abnormalities. This is not the cause of the patient's complaints and is likely incidental. Facial pain very characteristic of trigeminal neuralgia which it seems she has had intermittently on the right in the past. Now present on the left. Improved.  Diplopia may very well be secondary to head injury and is improving as well.   Tolerating medications without incident.   TSH, ACE, ESR and A1c are normal.  Remaining bloodwork is pending.     Recommendations: 1. Continue Neurontin and Tegretol at current doses.   2. Patient to continue follow up on an outpatient basis   LOS: 13 days    Alexis Goodell, MD Neurology [phone] 10/28/2019  9:58 AM

## 2019-10-28 NOTE — Discharge Summary (Signed)
[facility] [place] NAME: Molly Cisneros    MR#:  [id number]  [place]:  [birth year]  DATE OF ADMISSION:  10/15/2019 ADMITTING PHYSICIAN: Collier Bullock, MD  DATE OF DISCHARGE: 10/28/2019 11:58 AM  PRIMARY CARE PHYSICIAN: Glean Hess, MD    ADMISSION DIAGNOSIS:  AKI (acute kidney injury) ([place]) [N17.9] Acute kidney injury ([place]) [N17.9] Hypotension, unspecified hypotension type [I95.9] Syncope, unspecified syncope type [R55] Diarrhea, unspecified type [R19.7]  DISCHARGE DIAGNOSIS:  Active Problems:   Obesity (BMI 30-39.9)   Essential hypertension   Chronic diarrhea of unknown origin   Hypokalemia   Hypomagnesemia   Hyponatremia   Dehydration   Acute kidney injury ([place])   Depression with anxiety   Abdominal pain   Lactic acidosis   Acute intractable headache   Blurred vision, left eye   Diarrhea   SECONDARY DIAGNOSIS:   Past Medical History:  Diagnosis Date  . Acute appendicitis with localized peritonitis 11/06/2018  . Anxiety   . C. difficile diarrhea 02/05/2019  . Clostridium difficile diarrhea   . High cholesterol   . Hypertension   . Hypokalemia 02/05/2019  . Loss of weight   . Severe sepsis ([place]) 02/05/2019  . Shingles    reoccuring shingles on face    HOSPITAL COURSE:   1.  Headache with left eye blurred vision.  Patient also has some pain in the left TMJ joint.  Neurology giving medications for trigeminal neuralgia.  I prescribed Tegretol 200 mg twice a day and Neurontin 300 mg twice daily.  Headache is better but not gone completely.  As needed Fioricet, only few pills prescribed.  Follow-up with neurology and ophthalmology as outpatient. 2.  Acute on chronic diarrhea with abdominal pain.  As needed Imodium.  Patient on Bentyl and octreotide already.  Creon trial.  I also started cholestyramine.  Last night was the first night that she had and had diarrhea. 3.  Sepsis ruled out 4.  Acute kidney injury.  This  has improved.  Pain was as high as 1.36 and came down to 0.97. 5.  Hypokalemia and hypomagnesemia.  These has improved with replacement during the hospital course and eating and less losses through the GI tract. 6.  Essential hypertension on Norvasc.  Side effect of this medication is constipation.  DISCHARGE CONDITIONS:   Satisfactory  CONSULTS OBTAINED:  Treatment Team:  Alexis Goodell, MD  DRUG ALLERGIES:   Allergies  Allergen Reactions  . Shellfish Allergy Anaphylaxis  . Aloe Vera Dermatitis    DISCHARGE MEDICATIONS:   Allergies as of 10/28/2019      Reactions   Shellfish Allergy Anaphylaxis   Aloe Vera Dermatitis      Medication List    STOP taking these medications   diphenoxylate-atropine 2.5-0.025 MG tablet Commonly known as: LOMOTIL   hydrOXYzine 25 MG capsule Commonly known as: VISTARIL   ondansetron 8 MG disintegrating tablet Commonly known as: ZOFRAN-ODT   traMADol 50 MG tablet Commonly known as: ULTRAM     TAKE these medications   ALPRAZolam 1 MG tablet Commonly known as: XANAX Take 1 mg by mouth 3 (three) times daily as needed for anxiety.   amLODipine 10 MG tablet Commonly known as: NORVASC Take 1 tablet (10 mg total) by mouth daily. Start taking on: October 29, 2019   amphetamine-dextroamphetamine 30 MG 24 hr capsule Commonly known as: ADDERALL XR Take 30 mg by mouth daily. What changed: Another medication with the same name was removed.  Continue taking this medication, and follow the directions you see here.   atorvastatin 20 MG tablet Commonly known as: LIPITOR TAKE (1) TABLET BY MOUTH EVERY DAY What changed: See the new instructions.   B-complex with vitamin C tablet Take 1 tablet by mouth daily.   butalbital-acetaminophen-caffeine 50-325-40 MG tablet Commonly known as: FIORICET Take 1 tablet by mouth every 6 (six) hours as needed for headache or migraine.   carbamazepine 200 MG tablet Commonly known as: TEGRETOL Take 1 tablet  (200 mg total) by mouth 2 (two) times daily.   cholestyramine 4 g packet Commonly known as: QUESTRAN Take 1 packet (4 g total) by mouth 3 (three) times daily.   cyanocobalamin 1000 MCG tablet Take 1 tablet (1,000 mcg total) by mouth daily.   cyclobenzaprine 5 MG tablet Commonly known as: FLEXERIL Take 1 tablet (5 mg total) by mouth 3 (three) times daily.   dicyclomine 20 MG tablet Commonly known as: BENTYL TAKE (1) TABLET BY MOUTH THREE TIMES A DAY BEFORE MEALS What changed: See the new instructions.   DULoxetine 60 MG capsule Commonly known as: CYMBALTA Take 60 mg by mouth daily.   etonogestrel-ethinyl estradiol 0.12-0.015 MG/24HR vaginal ring Commonly known as: NuvaRing USE AS DIRECTED   feeding supplement (ENSURE ENLIVE) Liqd Take 237 mLs by mouth 2 (two) times daily between meals.   folic acid 1 MG tablet Commonly known as: FOLVITE Take 1 tablet (1 mg total) by mouth daily.   gabapentin 300 MG capsule Commonly known as: NEURONTIN Take 1 capsule (300 mg total) by mouth 2 (two) times daily. What changed: See the new instructions.   lipase/protease/amylase 36000 UNITS Cpep capsule Commonly known as: CREON Take 2 capsules (72,000 Units total) by mouth 3 (three) times daily before meals.   loperamide 2 MG capsule Commonly known as: IMODIUM Take 1 capsule (2 mg total) by mouth as needed for diarrhea or loose stools.   Octreotide Acetate 200 MCG/ML Soln Inject 0.5 mLs (100 mcg total) as directed every 8 (eight) hours as needed (diarrhea).   pantoprazole 40 MG tablet Commonly known as: PROTONIX TAKE ONE TABLET BY MOUTH TWICE DAILY. 30MINUTES PRIOR TO MEALS What changed: See the new instructions.   potassium chloride 20 MEQ/15ML (10%) Soln Take 15 mLs (20 mEq total) by mouth 2 (two) times daily.   promethazine 25 MG tablet Commonly known as: PHENERGAN Take 1 tablet (25 mg total) by mouth every 8 (eight) hours as needed for nausea or vomiting.   TUBERCULIN SYR  1CC/27GX1/2" 27G X 1/2" 1 ML Misc Use for octreotide injection every 8 hours as needed for diarrhea   valACYclovir 1000 MG tablet Commonly known as: VALTREX Take 1,000 mg by mouth 2 (two) times daily as needed.        DISCHARGE INSTRUCTIONS:   Follow-up PMD 5 days Follow-up ophthalmology Follow-up neurology as outpatient  If you experience worsening of your admission symptoms, develop shortness of breath, life threatening emergency, suicidal or homicidal thoughts you must seek medical attention immediately by calling 911 or calling your MD immediately  if symptoms less severe.  You Must read complete instructions/literature along with all the possible adverse reactions/side effects for all the Medicines you take and that have been prescribed to you. Take any new Medicines after you have completely understood and accept all the possible adverse reactions/side effects.   Please note  You were cared for by a hospitalist during your hospital stay. If you have any questions about your discharge medications or the  care you received while you were in the hospital after you are discharged, you can call the unit and asked to speak with the hospitalist on call if the hospitalist that took care of you is not available. Once you are discharged, your primary care physician will handle any further medical issues. Please note that NO REFILLS for any discharge medications will be authorized once you are discharged, as it is imperative that you return to your primary care physician (or establish a relationship with a primary care physician if you do not have one) for your aftercare needs so that they can reassess your need for medications and monitor your lab values.    Today   CHIEF COMPLAINT:   Chief Complaint  Patient presents with  . Loss of Consciousness    HISTORY OF PRESENT ILLNESS:  Molly Cisneros  is a 34 y.o. female came in after passing out   VITAL SIGNS:  Blood pressure (!) 130/99,  pulse 97, temperature 98.3 F (36.8 C), temperature source Oral, resp. rate 18, height 6' (1.829 m), weight 101 kg, SpO2 93 %.  I/O:    Intake/Output Summary (Last 24 hours) at 10/28/2019 1557 Last data filed at 10/28/2019 1004 Gross per 24 hour  Intake 600 ml  Output 400 ml  Net 200 ml    PHYSICAL EXAMINATION:  GENERAL:  34 y.o.-year-old patient lying in the bed with no acute distress.  EYES: Pupils equal, round, reactive to light and accommodation. No scleral icterus. HEENT: Head atraumatic, normocephalic. Oropharynx and nasopharynx clear.  LUNGS: Normal breath sounds bilaterally, no wheezing, rales,rhonchi or crepitation. No use of accessory muscles of respiration.  CARDIOVASCULAR: S1, S2 normal. No murmurs, rubs, or gallops.  ABDOMEN: Soft, non-tender, non-distended. Bowel sounds present. No organomegaly or mass.  EXTREMITIES: No pedal edema, cyanosis, or clubbing.  NEUROLOGIC: Cranial nerves II through XII are intact. PSYCHIATRIC: The patient is alert and oriented x 3.  SKIN: No obvious rash, lesion, or ulcer.   DATA REVIEW:    Chemistries  Recent Labs  Lab 10/27/19 0606  NA 139  K 4.4  CL 107  CO2 24  GLUCOSE 141*  BUN 19  CREATININE 0.97  CALCIUM 9.2  MG 2.0     Microbiology Results  Results for orders placed or performed during the hospital encounter of 10/15/19  SARS Coronavirus 2 by RT PCR (hospital order, performed in [facility] hospital lab) Nasopharyngeal Nasopharyngeal Swab     Status: None   Collection Time: 10/15/19  7:24 AM   Specimen: Nasopharyngeal Swab  Result Value Ref Range Status   SARS Coronavirus 2 NEGATIVE NEGATIVE Final    Comment: (NOTE) SARS-CoV-2 target nucleic acids are NOT DETECTED. The SARS-CoV-2 RNA is generally detectable in upper and lower respiratory specimens during the acute phase of infection. The lowest concentration of SARS-CoV-2 viral copies this assay can detect is 250 copies / mL. A negative result does not preclude  SARS-CoV-2 infection and should not be used as the sole basis for treatment or other patient management decisions.  A negative result may occur with improper specimen collection / handling, submission of specimen other than nasopharyngeal swab, presence of viral mutation(s) within the areas targeted by this assay, and inadequate number of viral copies (<250 copies / mL). A negative result must be combined with clinical observations, patient history, and epidemiological information. Fact Sheet for Patients:   StrictlyIdeas.no Fact Sheet for Healthcare Providers: BankingDealers.co.za This test is not yet approved or cleared  by the [place]  FDA and has been authorized for detection and/or diagnosis of SARS-CoV-2 by FDA under an Emergency Use Authorization (EUA).  This EUA will remain in effect (meaning this test can be used) for the duration of the COVID-19 declaration under Section 564(b)(1) of the Act, 21 U.S.C. section 360bbb-3(b)(1), unless the authorization is terminated or revoked sooner. Performed at [facility], [place], [place] [address]   C Difficile Quick Screen w PCR reflex     Status: None   Collection Time: 10/15/19  9:15 AM   Specimen: Urine, Clean Catch; Stool  Result Value Ref Range Status   C Diff antigen NEGATIVE NEGATIVE Final   C Diff toxin NEGATIVE NEGATIVE Final   C Diff interpretation No C. difficile detected.  Final    Comment: Performed at [facility], [address] [place], [place] [address]  Gastrointestinal Panel by PCR , Stool     Status: None   Collection Time: 10/15/19  9:15 AM   Specimen: Urine, Clean Catch; Stool  Result Value Ref Range Status   Campylobacter species NOT DETECTED NOT DETECTED Final   Plesimonas shigelloides NOT DETECTED NOT DETECTED Final   Salmonella species NOT DETECTED NOT DETECTED Final   Yersinia enterocolitica NOT DETECTED NOT DETECTED  Final   Vibrio species NOT DETECTED NOT DETECTED Final   Vibrio cholerae NOT DETECTED NOT DETECTED Final   Enteroaggregative E coli (EAEC) NOT DETECTED NOT DETECTED Final   Enteropathogenic E coli (EPEC) NOT DETECTED NOT DETECTED Final   Enterotoxigenic E coli (ETEC) NOT DETECTED NOT DETECTED Final   Shiga like toxin producing E coli (STEC) NOT DETECTED NOT DETECTED Final   Shigella/Enteroinvasive E coli (EIEC) NOT DETECTED NOT DETECTED Final   Cryptosporidium NOT DETECTED NOT DETECTED Final   Cyclospora cayetanensis NOT DETECTED NOT DETECTED Final   Entamoeba histolytica NOT DETECTED NOT DETECTED Final   Giardia lamblia NOT DETECTED NOT DETECTED Final   Adenovirus F40/41 NOT DETECTED NOT DETECTED Final   Astrovirus NOT DETECTED NOT DETECTED Final   Norovirus GI/GII NOT DETECTED NOT DETECTED Final   Rotavirus A NOT DETECTED NOT DETECTED Final   Sapovirus (I, II, IV, and V) NOT DETECTED NOT DETECTED Final    Comment: Performed at [facility], [address] [place], [place] [address]     Management plans discussed with the patient, and she is in agreement.  CODE STATUS:     Code Status Orders  (From admission, onward)         Start     Ordered   10/15/19 0932  Full code  Continuous        10/15/19 0935        Code Status History    Date Active Date Inactive Code Status Order ID Comments User Context   07/01/2019 1836 07/11/2019 1738 Full Code [id number]  Ivor Costa, MD Inpatient   06/11/2019 1431 06/16/2019 2206 Full Code [id number]  Para Skeans, MD ED   02/13/2019 0034 02/18/2019 1741 Full Code [id number]  Mansy, Arvella Merles, MD ED   02/05/2019 2230 02/09/2019 1747 Full Code [id number]  Lance Coon, MD Inpatient   11/06/2018 1742 11/06/2018 2253 Full Code [id number]  Herbert Pun, MD ED   Advance Care Planning Activity      TOTAL TIME TAKING CARE OF THIS PATIENT: 32 minutes.    Loletha Grayer M.D on 10/28/2019 at 3:57 PM  Between 7am to 6pm - Pager -  [phone]  After 6pm go to [email or web address] - password  [email or web address]  [facility]  CC: Primary care physician; Glean Hess, MD

## 2019-10-30 LAB — VITAMIN B1: Vitamin B1 (Thiamine): 223.1 nmol/L — ABNORMAL HIGH (ref 66.5–200.0)

## 2019-11-02 ENCOUNTER — Encounter: Payer: Self-pay | Admitting: Internal Medicine

## 2019-11-02 ENCOUNTER — Ambulatory Visit (INDEPENDENT_AMBULATORY_CARE_PROVIDER_SITE_OTHER): Payer: PRIVATE HEALTH INSURANCE | Admitting: Internal Medicine

## 2019-11-02 ENCOUNTER — Other Ambulatory Visit: Payer: Self-pay

## 2019-11-02 VITALS — BP 132/92 | HR 115 | Temp 98.4°F | Ht 72.0 in | Wt 233.0 lb

## 2019-11-02 DIAGNOSIS — I10 Essential (primary) hypertension: Secondary | ICD-10-CM

## 2019-11-02 DIAGNOSIS — G44311 Acute post-traumatic headache, intractable: Secondary | ICD-10-CM | POA: Diagnosis not present

## 2019-11-02 DIAGNOSIS — R197 Diarrhea, unspecified: Secondary | ICD-10-CM | POA: Diagnosis not present

## 2019-11-02 DIAGNOSIS — G500 Trigeminal neuralgia: Secondary | ICD-10-CM

## 2019-11-02 DIAGNOSIS — M50322 Other cervical disc degeneration at C5-C6 level: Secondary | ICD-10-CM

## 2019-11-02 LAB — ACETYLCHOLINE RECEPTOR AB, ALL
Acety choline binding ab: <0.03 nmol/L (ref 0.00–0.24)
Acetylchol Block Ab: 18 % (ref 0–25)
Acetylcholine Modulat Ab: <12 % (ref 0–20)

## 2019-11-02 MED ORDER — BUTALBITAL-APAP-CAFFEINE 50-325-40 MG PO TABS: 1.0000 | ORAL_TABLET | Freq: Four times a day (QID) | ORAL | 0 refills | Status: DC | PRN

## 2019-11-02 MED ORDER — CYCLOBENZAPRINE HCL 5 MG PO TABS: 5.0000 mg | ORAL_TABLET | Freq: Three times a day (TID) | ORAL | 0 refills | Status: DC

## 2019-11-02 NOTE — Progress Notes (Signed)
Date:  11/02/2019   Name:  Molly Cisneros   DOB:  04/11/1986   MRN:  [id number]   Chief Complaint: Hospitalization Follow-up (tdap ) Hospitalized at [facility] for diarrhea, electrolyte abnormality.  10/15/19 to 10/28/19.   Has GI appt on 11/25/19 at [facility] but wants to see Dr. Marius Ditch again. No neurology appt yet.  HOSPITAL COURSE:   1.  Headache with left eye blurred vision.  Patient also has some pain in the left TMJ joint.  Neurology giving medications for trigeminal neuralgia.  I prescribed Tegretol 200 mg twice a day and Neurontin 300 mg twice daily.  Headache is better but not gone completely.  As needed Fioricet, only few pills prescribed.  Follow-up with neurology and ophthalmology as outpatient. 2.  Acute on chronic diarrhea with abdominal pain.  As needed Imodium.  Patient on Bentyl and octreotide already.  Creon trial.  I also started cholestyramine.  Last night was the first night that she had and had diarrhea. 3.  Sepsis ruled out 4.  Acute kidney injury.  This has improved.  Pain was as high as 1.36 and came down to 0.97. 5.  Hypokalemia and hypomagnesemia.  These has improved with replacement during the hospital course and eating and less losses through the GI tract. 6.  Essential hypertension on Norvasc.  Side effect of this medication is constipation.  Hypertension This is a chronic problem. The problem is controlled (was high in the hospital so medication was changes). Associated symptoms include headaches (and left facial pain). Pertinent negatives include no chest pain, palpitations or shortness of breath. Past treatments include calcium channel blockers. The current treatment provides significant improvement. There are no compliance problems.   Diarrhea  This is a chronic (multiple hospitalizations) problem. The problem has been gradually improving (actually had a formed stool yesterday). Associated symptoms include headaches (and left facial pain). Pertinent negatives  include no abdominal pain, arthralgias, chills, fever or myalgias. Treatments tried: now on Octreotide twice a day - wants to try to get the once monthly injection. Her past medical history is significant for a recent abdominal surgery.  Headache  This is a new problem. The problem occurs intermittently (every other day or so). The pain is located in the frontal region. The pain radiates to the face. The quality of the pain is described as sharp and stabbing. The pain is moderate (seen by Neurology - felt to have post traumatic head ache and trigeminal neuralgia). Associated symptoms include back pain. Pertinent negatives include no abdominal pain, dizziness or fever. Treatments tried: started on gabapentin, tegretol and fioricet (prn) The treatment provided moderate relief. Her past medical history is significant for hypertension.  Facial pain - felt to be trigeminal neuralgia by neurology.  MRI brain was unremarkable except for one small focus of increased T2 signal in the white matter of the anterior right temporal lobe.  This was not felt to be significant.  Of note, acetylcholine Ab panel was negative, as was Lyme serology and ACE levels.   Lab Results  Component Value Date   CREATININE 0.97 10/27/2019   BUN 19 10/27/2019   NA 139 10/27/2019   K 4.4 10/27/2019   CL 107 10/27/2019   CO2 24 10/27/2019   Lab Results  Component Value Date   CHOL 266 (H) 05/26/2017   HDL 40 (L) 05/26/2017   LDLCALC 147 (H) 05/26/2017   TRIG 394 (H) 05/26/2017   CHOLHDL 6.7 05/26/2017   Lab Results  Component Value Date   TSH 2.019 10/26/2019   Lab Results  Component Value Date   HGBA1C 5.7 (H) 10/26/2019   Lab Results  Component Value Date   WBC 9.8 10/19/2019   HGB 10.2 (L) 10/19/2019   HCT 30.0 (L) 10/19/2019   MCV 84.7 10/19/2019   PLT 367 10/19/2019   Lab Results  Component Value Date   ALT 40 10/15/2019   AST 34 10/15/2019   ALKPHOS 70 10/15/2019   BILITOT 0.6 10/15/2019      Review of Systems  Constitutional: Negative for chills, fatigue, fever and unexpected weight change.  Respiratory: Negative for chest tightness and shortness of breath.   Cardiovascular: Negative for chest pain, palpitations and leg swelling.  Gastrointestinal: Positive for diarrhea. Negative for abdominal pain and blood in stool.  Musculoskeletal: Positive for back pain. Negative for arthralgias, gait problem and myalgias.  Neurological: Positive for headaches (and left facial pain). Negative for dizziness, tremors, facial asymmetry and speech difficulty.  Psychiatric/Behavioral: Negative for dysphoric mood and sleep disturbance. The patient is not nervous/anxious.     Patient Active Problem List   Diagnosis Date Noted  . Acute intractable headache   . Blurred vision, left eye   . Diarrhea   . Abdominal pain 10/18/2019  . Lactic acidosis 10/18/2019  . Vitamin D deficiency 07/11/2019  . Acute kidney injury ([place]) 07/01/2019  . GERD (gastroesophageal reflux disease) 07/01/2019  . Depression with anxiety 07/01/2019  . Dehydration   . Folate deficiency 06/30/2019  . B12 deficiency 06/30/2019  . Orthostasis 06/21/2019  . Hypochloremia   . Hyponatremia 02/13/2019  . Hypokalemia 02/05/2019  . Hypomagnesemia 02/05/2019  . Chronic diarrhea of unknown origin   . Essential hypertension 11/03/2018  . Hyperlipidemia, mixed 11/03/2018  . Degeneration of C5-C6 intervertebral disc 08/31/2018  . Herniation of left side of L4-L5 intervertebral disc 06/05/2017  . Obesity (BMI 30-39.9) 06/05/2017  . Chronic left-sided low back pain without sciatica 07/23/2015    Allergies  Allergen Reactions  . Shellfish Allergy Anaphylaxis  . Aloe Vera Dermatitis    Past Surgical History:  Procedure Laterality Date  . COLONOSCOPY WITH PROPOFOL N/A 12/23/2018   Procedure: COLONOSCOPY WITH PROPOFOL;  Surgeon: Lin Landsman, MD;  Location: [facility] ENDOSCOPY;  Service: Gastroenterology;  Laterality:  N/A;  . ESOPHAGOGASTRODUODENOSCOPY (EGD) WITH PROPOFOL N/A 12/23/2018   Procedure: ESOPHAGOGASTRODUODENOSCOPY (EGD) WITH PROPOFOL;  Surgeon: Lin Landsman, MD;  Location: [facility] ENDOSCOPY;  Service: Gastroenterology;  Laterality: N/A;  . GIVENS CAPSULE STUDY N/A 06/14/2019   Procedure: GIVENS CAPSULE STUDY;  Surgeon: Lin Landsman, MD;  Location: [facility] ENDOSCOPY;  Service: Gastroenterology;  Laterality: N/A;  . GIVENS CAPSULE STUDY N/A 06/16/2019   Procedure: GIVENS CAPSULE STUDY;  Surgeon: Lin Landsman, MD;  Location: [facility] ENDOSCOPY;  Service: Gastroenterology;  Laterality: N/A;  . LAPAROSCOPIC APPENDECTOMY N/A 11/06/2018   Procedure: APPENDECTOMY LAPAROSCOPIC;  Surgeon: Herbert Pun, MD;  Location: [facility] ORS;  Service: General;  Laterality: N/A;    Social History   Tobacco Use  . Smoking status: Never Smoker  . Smokeless tobacco: Never Used  Vaping Use  . Vaping Use: Never used  Substance Use Topics  . Alcohol use: Yes    Alcohol/week: 2.0 standard drinks    Types: 2 Cans of beer per week  . Drug use: No     Medication list has been reviewed and updated.  Current Meds  Medication Sig  . ALPRAZolam (XANAX) 1 MG tablet Take 1 mg by mouth 3 (three)  times daily as needed for anxiety.   Marland Kitchen amLODipine (NORVASC) 10 MG tablet Take 1 tablet (10 mg total) by mouth daily.  Marland Kitchen atorvastatin (LIPITOR) 20 MG tablet TAKE (1) TABLET BY MOUTH EVERY DAY (Patient taking differently: Take 20 mg by mouth daily. )  . B Complex-C (B-COMPLEX WITH VITAMIN C) tablet Take 1 tablet by mouth daily.  . butalbital-acetaminophen-caffeine (FIORICET) 50-325-40 MG tablet Take 1 tablet by mouth every 6 (six) hours as needed for headache or migraine.  . carbamazepine (TEGRETOL) 200 MG tablet Take 1 tablet (200 mg total) by mouth 2 (two) times daily.  . cyclobenzaprine (FLEXERIL) 5 MG tablet Take 1 tablet (5 mg total) by mouth 3 (three) times daily.  Marland Kitchen dicyclomine (BENTYL) 20 MG tablet TAKE (1)  TABLET BY MOUTH THREE TIMES A DAY BEFORE MEALS (Patient taking differently: Take 20 mg by mouth 3 (three) times daily before meals. )  . DULoxetine (CYMBALTA) 60 MG capsule Take 60 mg by mouth daily.   Marland Kitchen etonogestrel-ethinyl estradiol (NUVARING) 0.12-0.015 MG/24HR vaginal ring USE AS DIRECTED  . folic acid (FOLVITE) 1 MG tablet Take 1 tablet (1 mg total) by mouth daily.  Marland Kitchen gabapentin (NEURONTIN) 300 MG capsule Take 1 capsule (300 mg total) by mouth 2 (two) times daily.  . lipase/protease/amylase (CREON) 36000 UNITS CPEP capsule Take 2 capsules (72,000 Units total) by mouth 3 (three) times daily before meals.  Marland Kitchen loperamide (IMODIUM) 2 MG capsule Take 1 capsule (2 mg total) by mouth as needed for diarrhea or loose stools.  . nystatin cream (MYCOSTATIN)   . Octreotide Acetate 200 MCG/ML SOLN Inject 0.5 mLs (100 mcg total) as directed every 8 (eight) hours as needed (diarrhea).  . pantoprazole (PROTONIX) 40 MG tablet TAKE ONE TABLET BY MOUTH TWICE DAILY. 30MINUTES PRIOR TO MEALS (Patient taking differently: Take 40 mg by mouth 2 (two) times daily before a meal. )  . potassium chloride 20 MEQ/15ML (10%) SOLN Take 15 mLs (20 mEq total) by mouth 2 (two) times daily.  . promethazine (PHENERGAN) 25 MG tablet Take 1 tablet (25 mg total) by mouth every 8 (eight) hours as needed for nausea or vomiting.  . TUBERCULIN SYR 1CC/27GX1/2" 27G X 1/2" 1 ML MISC Use for octreotide injection every 8 hours as needed for diarrhea  . valACYclovir (VALTREX) 1000 MG tablet Take 1,000 mg by mouth 2 (two) times daily as needed.  . vitamin B-12 1000 MCG tablet Take 1 tablet (1,000 mcg total) by mouth daily.    PHQ 2/9 Scores 11/02/2019 06/02/2019 08/31/2018 05/18/2018  PHQ - 2 Score 0 2 1 0  PHQ- 9 Score 1 11 - -    GAD 7 : Generalized Anxiety Score 11/02/2019  Nervous, Anxious, on Edge 0  Control/stop worrying 0  Worry too much - different things 0  Trouble relaxing 0  Restless 0  Easily annoyed or irritable 1  Afraid -  awful might happen 0  Total GAD 7 Score 1  Anxiety Difficulty Not difficult at all    BP Readings from Last 3 Encounters:  11/02/19 (!) 132/92  10/28/19 (!) 130/99  07/11/19 117/80    Physical Exam Eyes:     Extraocular Movements: Extraocular movements intact.     Pupils: Pupils are equal, round, and reactive to light.  Neck:     Vascular: No carotid bruit.  Cardiovascular:     Rate and Rhythm: Regular rhythm. Tachycardia present.     Pulses: Normal pulses.     Heart sounds: No murmur  heard.   Pulmonary:     Effort: Pulmonary effort is normal.     Breath sounds: Normal breath sounds. No wheezing or rhonchi.  Musculoskeletal:     Cervical back: Normal range of motion.     Right lower leg: No edema.     Left lower leg: No edema.  Lymphadenopathy:     Cervical: No cervical adenopathy.  Skin:    General: Skin is warm and dry.     Capillary Refill: Capillary refill takes less than 2 seconds.  Neurological:     Mental Status: She is alert and oriented to person, place, and time.     Gait: Gait normal.  Psychiatric:        Mood and Affect: Mood normal.     Wt Readings from Last 3 Encounters:  11/02/19 233 lb (105.7 kg)  10/15/19 222 lb 10.6 oz (101 kg)  07/01/19 215 lb 6.2 oz (97.7 kg)    BP (!) 132/92   Pulse (!) 115   Temp 98.4 F (36.9 C) (Oral)   Ht 6' (1.829 m)   Wt 233 lb (105.7 kg)   LMP 10/24/2019 (Approximate)   BMI 31.60 kg/m   Assessment and Plan: 1. Essential hypertension Clinically stable exam with well controlled BP on amlodipine. Tolerating medications without side effects at this time.  2. Intractable acute post-traumatic headache MRI done - can continue fioricet as needed - Ambulatory referral to Neurology - butalbital-acetaminophen-caffeine (FIORICET) 50-325-40 MG tablet; Take 1 tablet by mouth every 6 (six) hours as needed for headache or migraine.  Dispense: 30 tablet; Refill: 0  3. Trigeminal neuralgia Gabapentin and Tegretol causing  some sedation Continue to avoid driving until stablized Refer to Neurology for further medication management - Ambulatory referral to Neurology  4. Diarrhea, unspecified type Much improved on octreotide - will continue  Refer to GI for further discussion, medication adjustment, etc Check electrolytes - Ambulatory referral to Gastroenterology - Basic metabolic panel  5. Degeneration of C5-C6 intervertebral disc - cyclobenzaprine (FLEXERIL) 5 MG tablet; Take 1 tablet (5 mg total) by mouth 3 (three) times daily.  Dispense: 60 tablet; Refill: 0   Partially dictated using Editor, commissioning. Any errors are unintentional.  Halina Maidens, MD [facility]  11/02/2019

## 2019-11-03 LAB — BASIC METABOLIC PANEL
BUN/Creatinine Ratio: 23 (ref 9–23)
BUN: 19 mg/dL (ref 6–20)
CO2: 23 mmol/L (ref 20–29)
Calcium: 9.9 mg/dL (ref 8.7–10.2)
Chloride: 100 mmol/L (ref 96–106)
Creatinine, Ser: 0.82 mg/dL (ref 0.57–1.00)
GFR calc Af Amer: 107 mL/min/{1.73_m2} (ref >59)
GFR calc non Af Amer: 93 mL/min/{1.73_m2} (ref >59)
Glucose: 76 mg/dL (ref 65–99)
Potassium: 4.6 mmol/L (ref 3.5–5.2)
Sodium: 139 mmol/L (ref 134–144)

## 2019-11-10 ENCOUNTER — Other Ambulatory Visit: Payer: Self-pay | Admitting: Gastroenterology

## 2019-11-10 DIAGNOSIS — K529 Noninfective gastroenteritis and colitis, unspecified: Secondary | ICD-10-CM

## 2019-11-10 MED ORDER — AMITRIPTYLINE HCL 25 MG PO TABS: ORAL_TABLET | ORAL | 1 refills | Status: DC

## 2019-11-10 NOTE — Telephone Encounter (Signed)
We can not refill Octreotide Acetate 262mcg/ml that comes from Dr. Mike Gip.  Last refill Amitriptyline 25mg  10/13/2018 1 refills  Last office visit Patient saw [facility] on 08/23/2019 Has follow up appointment with them on 11/25/2019.  Called patient and she states that [facility] did not find out anything with her symptoms and wants to come back to Dr. Marius Ditch. She states she is going to cancel the follow up appointment with [facility] and will see dr. Marius Ditch on 01/20/2020

## 2019-11-10 NOTE — Telephone Encounter (Signed)
Octreotide Acetate 200 MCG/ML SOLN  Amitriptyline 25 mg  [employer]

## 2019-11-10 NOTE — Telephone Encounter (Signed)
Yes, agree with the above. She has to contact Dr Mike Gip for octreotide  RV

## 2019-11-14 ENCOUNTER — Telehealth: Payer: Self-pay

## 2019-11-14 ENCOUNTER — Other Ambulatory Visit: Payer: Self-pay | Admitting: Internal Medicine

## 2019-11-14 DIAGNOSIS — K529 Noninfective gastroenteritis and colitis, unspecified: Secondary | ICD-10-CM

## 2019-11-14 MED ORDER — OCTREOTIDE ACETATE 200 MCG/ML IJ SOLN: 100.0000 ug | Freq: Three times a day (TID) | INTRAMUSCULAR | 3 refills | Status: DC | PRN

## 2019-11-14 NOTE — Telephone Encounter (Signed)
I think maybe the sausage fingers is fluid retention from the high dose of Amlodipine.  She needs to go back to 10 mg per day.  I can add losartan 50 mg without the diuretic since she has issues with electrolyte imbalance.  I would like to see her for her BP in one month.  I also sent in the Octreotide to [facility].

## 2019-11-14 NOTE — Telephone Encounter (Signed)
Pt called saying she ran out of amlodipine because she has been accidentally taking 20 mg daily. 1 in the morning and 1 in the evening.   She said her hands are swollen and she has "sausage fingers." She wants to know if she can try to switch back to lisinopril/hctz instead of amlodipine because she felt more controlled. Her diastolic BP is always over 100 now- even on 20 mg of amlodipine daily.  Please advise.   CM

## 2019-11-15 ENCOUNTER — Other Ambulatory Visit: Payer: Self-pay

## 2019-11-15 MED ORDER — AMLODIPINE BESYLATE 10 MG PO TABS: 10.0000 mg | ORAL_TABLET | Freq: Every day | ORAL | 0 refills | Status: DC

## 2019-11-15 MED ORDER — LOSARTAN POTASSIUM 50 MG PO TABS
50.0000 mg | ORAL_TABLET | Freq: Every day | ORAL | 0 refills | Status: DC
Start: 2019-11-15 — End: 2019-12-27

## 2019-11-15 NOTE — Telephone Encounter (Signed)
Pt informed. Sent in losartan 50 mg, and she will be sure to take 1 tablet of amlodipine daily.  6 week follow up scheduled for BP check.   CM

## 2019-11-21 ENCOUNTER — Other Ambulatory Visit: Payer: Self-pay | Admitting: Internal Medicine

## 2019-11-21 DIAGNOSIS — M50322 Other cervical disc degeneration at C5-C6 level: Secondary | ICD-10-CM

## 2019-11-24 DIAGNOSIS — R519 Headache, unspecified: Secondary | ICD-10-CM | POA: Insufficient documentation

## 2019-12-12 ENCOUNTER — Other Ambulatory Visit: Payer: Self-pay | Admitting: Internal Medicine

## 2019-12-12 DIAGNOSIS — M50322 Other cervical disc degeneration at C5-C6 level: Secondary | ICD-10-CM

## 2019-12-12 NOTE — Telephone Encounter (Signed)
Requested medication (s) are due for refill today: yes  Requested medication (s) are on the active medication list: yes  Last refill:  11/21/19  Future visit scheduled: yes  Notes to clinic:  med not delegated to NT to RF   Requested Prescriptions  Pending Prescriptions Disp Refills   cyclobenzaprine (FLEXERIL) 5 MG tablet [Pharmacy Med Name: CYCLOBENZAPRINE HCL 5 MG TAB] 60 tablet 0    Sig: TAKE (1) TABLET BY MOUTH THREE TIMES A DAY      Not Delegated - Analgesics:  Muscle Relaxants Failed - 12/12/2019  1:51 PM      Failed - This refill cannot be delegated      Passed - Valid encounter within last 6 months    Recent Outpatient Visits           1 month ago Essential hypertension   [facility] Glean Hess, MD   6 months ago Syncope, unspecified syncope type   [facility] Glean Hess, MD   6 months ago Chronic diarrhea   [facility] Glean Hess, MD   1 year ago Post-concussion headache   [facility] Glean Hess, MD   1 year ago Acute non-recurrent maxillary sinusitis   [facility] Glean Hess, MD       Future Appointments             In 2 weeks [facility] Melia Jesse Sans, MD [facility] [place], [place]   In 1 month Vanga, Tally Due, MD [place]

## 2019-12-22 ENCOUNTER — Other Ambulatory Visit: Payer: Self-pay | Admitting: Gastroenterology

## 2019-12-27 ENCOUNTER — Ambulatory Visit (INDEPENDENT_AMBULATORY_CARE_PROVIDER_SITE_OTHER): Payer: PRIVATE HEALTH INSURANCE | Admitting: Internal Medicine

## 2019-12-27 ENCOUNTER — Encounter: Payer: Self-pay | Admitting: Internal Medicine

## 2019-12-27 ENCOUNTER — Other Ambulatory Visit: Payer: Self-pay

## 2019-12-27 VITALS — BP 118/82 | HR 96 | Ht 72.0 in | Wt 245.0 lb

## 2019-12-27 DIAGNOSIS — K529 Noninfective gastroenteritis and colitis, unspecified: Secondary | ICD-10-CM

## 2019-12-27 DIAGNOSIS — I10 Essential (primary) hypertension: Secondary | ICD-10-CM

## 2019-12-27 DIAGNOSIS — R519 Headache, unspecified: Secondary | ICD-10-CM | POA: Diagnosis not present

## 2019-12-27 MED ORDER — LOSARTAN POTASSIUM 50 MG PO TABS: 50.0000 mg | ORAL_TABLET | Freq: Two times a day (BID) | ORAL | 1 refills | Status: DC

## 2019-12-27 MED ORDER — AMLODIPINE BESYLATE 10 MG PO TABS: 10.0000 mg | ORAL_TABLET | Freq: Every day | ORAL | 1 refills | Status: DC

## 2019-12-27 NOTE — Progress Notes (Signed)
Date:  12/27/2019   Name:  Molly Cisneros   DOB:  [birth year]   MRN:  [id number]   Chief Complaint: Hypertension  Hypertension This is a chronic problem. The problem has been gradually improving since onset. The problem is controlled. Associated symptoms include headaches (improved) and palpitations (mild increase in heart rate is chronic). Pertinent negatives include no chest pain or shortness of breath. Past treatments include angiotensin blockers and calcium channel blockers (losartan added 6 weeks ago). The current treatment provides significant improvement. There are no compliance problems.  There is no history of kidney disease.  Diarrhea  This is a chronic problem. The problem has been gradually improving. Associated symptoms include headaches (improved). Pertinent negatives include no abdominal pain, arthralgias, chills, coughing or fever. Improvement on treatment: she has actually had no stool for three days but is not uncomfortable. complete workup with no cause found; did respond to octreotide which she has continued.  Headache  This is a recurrent (felt to be post concussive and possibly trigeminal neuralgia) problem. The problem has been gradually improving. The pain is located in the frontal region. Radiates to: to the left face. The pain quality is similar to prior headaches. Pertinent negatives include no abdominal pain, coughing, dizziness or fever. Treatments tried: now on gabapentin and tegretol daily with benefit. The treatment provided significant relief. Her past medical history is significant for hypertension. (Complete workup with no cause found; did respond to octreotide which she has continued.)    Lab Results  Component Value Date   CREATININE 0.82 11/02/2019   BUN 19 11/02/2019   NA 139 11/02/2019   K 4.6 11/02/2019   CL 100 11/02/2019   CO2 23 11/02/2019   Lab Results  Component Value Date   CHOL 266 (H) 05/26/2017   HDL 40 (L) 05/26/2017   LDLCALC 147 (H)  05/26/2017   TRIG 394 (H) 05/26/2017   CHOLHDL 6.7 05/26/2017   Lab Results  Component Value Date   TSH 2.019 10/26/2019   Lab Results  Component Value Date   HGBA1C 5.7 (H) 10/26/2019   Lab Results  Component Value Date   WBC 9.8 10/19/2019   HGB 10.2 (L) 10/19/2019   HCT 30.0 (L) 10/19/2019   MCV 84.7 10/19/2019   PLT 367 10/19/2019   Lab Results  Component Value Date   ALT 40 10/15/2019   AST 34 10/15/2019   GGT 32 06/11/2019   ALKPHOS 70 10/15/2019   BILITOT 0.6 10/15/2019     Review of Systems  Constitutional: Negative for chills, fatigue and fever.  HENT: Negative for trouble swallowing.   Respiratory: Negative for cough and shortness of breath.   Cardiovascular: Positive for palpitations (mild increase in heart rate is chronic). Negative for chest pain and leg swelling.  Gastrointestinal: Negative for abdominal pain, blood in stool and diarrhea.  Musculoskeletal: Negative for arthralgias.  Skin: Negative for rash.  Neurological: Positive for headaches (improved). Negative for dizziness and light-headedness.  Psychiatric/Behavioral: Negative for dysphoric mood and sleep disturbance. The patient is not nervous/anxious.     Patient Active Problem List   Diagnosis Date Noted  . Headache disorder 11/24/2019  . Blurred vision, left eye   . History of Clostridium difficile colitis 07/16/2019  . Vitamin D deficiency 07/11/2019  . GERD (gastroesophageal reflux disease) 07/01/2019  . Depression with anxiety 07/01/2019  . Folate deficiency 06/30/2019  . B12 deficiency 06/30/2019  . Orthostasis 06/21/2019  . Hypochloremia   . Hyponatremia 02/13/2019  .  Hypokalemia 02/05/2019  . Chronic diarrhea of unknown origin   . Essential hypertension 11/03/2018  . Hyperlipidemia, mixed 11/03/2018  . Degeneration of C5-C6 intervertebral disc 08/31/2018  . Herniation of left side of L4-L5 intervertebral disc 06/05/2017  . Obesity (BMI 30-39.9) 06/05/2017    Allergies   Allergen Reactions  . Shellfish Allergy Anaphylaxis  . Aloe Vera Dermatitis  . Latex     Other reaction(s): Unknown    Past Surgical History:  Procedure Laterality Date  . COLONOSCOPY WITH PROPOFOL N/A 12/23/2018   Procedure: COLONOSCOPY WITH PROPOFOL;  Surgeon: Lin Landsman, MD;  Location: [facility] ENDOSCOPY;  Service: Gastroenterology;  Laterality: N/A;  . ESOPHAGOGASTRODUODENOSCOPY (EGD) WITH PROPOFOL N/A 12/23/2018   Procedure: ESOPHAGOGASTRODUODENOSCOPY (EGD) WITH PROPOFOL;  Surgeon: Lin Landsman, MD;  Location: [facility] ENDOSCOPY;  Service: Gastroenterology;  Laterality: N/A;  . GIVENS CAPSULE STUDY N/A 06/14/2019   Procedure: GIVENS CAPSULE STUDY;  Surgeon: Lin Landsman, MD;  Location: [facility] ENDOSCOPY;  Service: Gastroenterology;  Laterality: N/A;  . GIVENS CAPSULE STUDY N/A 06/16/2019   Procedure: GIVENS CAPSULE STUDY;  Surgeon: Lin Landsman, MD;  Location: [facility] ENDOSCOPY;  Service: Gastroenterology;  Laterality: N/A;  . LAPAROSCOPIC APPENDECTOMY N/A 11/06/2018   Procedure: APPENDECTOMY LAPAROSCOPIC;  Surgeon: Herbert Pun, MD;  Location: [facility] ORS;  Service: General;  Laterality: N/A;    Social History   Tobacco Use  . Smoking status: Never Smoker  . Smokeless tobacco: Never Used  Vaping Use  . Vaping Use: Never used  Substance Use Topics  . Alcohol use: Yes    Alcohol/week: 2.0 standard drinks    Types: 2 Cans of beer per week  . Drug use: No     Medication list has been reviewed and updated.  Current Meds  Medication Sig  . ALPRAZolam (XANAX) 1 MG tablet Take 1 mg by mouth 3 (three) times daily as needed for anxiety.   Marland Kitchen amitriptyline (ELAVIL) 25 MG tablet TAKE (1) TABLET BY MOUTH DAILY AT BEDTIME  . amLODipine (NORVASC) 10 MG tablet Take 1 tablet (10 mg total) by mouth daily.  Marland Kitchen amphetamine-dextroamphetamine (ADDERALL) 20 MG tablet Take 20 mg by mouth 2 (two) times daily.  Marland Kitchen atorvastatin (LIPITOR) 20 MG tablet TAKE (1) TABLET BY MOUTH  EVERY DAY (Patient taking differently: Take 20 mg by mouth daily. )  . B Complex-C (B-COMPLEX WITH VITAMIN C) tablet Take 1 tablet by mouth daily.  . butalbital-acetaminophen-caffeine (FIORICET) 50-325-40 MG tablet Take 1 tablet by mouth every 6 (six) hours as needed for headache or migraine.  . carbamazepine (TEGRETOL) 200 MG tablet Take 1 tablet (200 mg total) by mouth 2 (two) times daily.  . cyclobenzaprine (FLEXERIL) 5 MG tablet TAKE (1) TABLET BY MOUTH THREE TIMES A DAY  . dicyclomine (BENTYL) 20 MG tablet TAKE (1) TABLET BY MOUTH THREE TIMES A DAY BEFORE MEALS  . DULoxetine (CYMBALTA) 60 MG capsule Take 60 mg by mouth daily.   Marland Kitchen etonogestrel-ethinyl estradiol (NUVARING) 0.12-0.015 MG/24HR vaginal ring USE AS DIRECTED  . gabapentin (NEURONTIN) 300 MG capsule Take 1 capsule (300 mg total) by mouth 2 (two) times daily.  Marland Kitchen loperamide (IMODIUM) 2 MG capsule Take 1 capsule (2 mg total) by mouth as needed for diarrhea or loose stools.  Marland Kitchen losartan (COZAAR) 50 MG tablet Take 1 tablet (50 mg total) by mouth 2 (two) times daily.  Marland Kitchen nystatin cream (MYCOSTATIN)   . Octreotide Acetate 200 MCG/ML SOLN Inject 0.5 mLs (100 mcg total) as directed every 8 (eight) hours as  needed (diarrhea).  . pantoprazole (PROTONIX) 40 MG tablet TAKE ONE TABLET BY MOUTH TWICE DAILY. 30MINUTES PRIOR TO MEALS (Patient taking differently: Take 40 mg by mouth 2 (two) times daily before a meal. )  . potassium chloride 20 MEQ/15ML (10%) SOLN Take 15 mLs (20 mEq total) by mouth 2 (two) times daily.  . promethazine (PHENERGAN) 25 MG tablet Take 1 tablet (25 mg total) by mouth every 8 (eight) hours as needed for nausea or vomiting.  . valACYclovir (VALTREX) 1000 MG tablet Take 1,000 mg by mouth 2 (two) times daily as needed.  . vitamin B-12 1000 MCG tablet Take 1 tablet (1,000 mcg total) by mouth daily.  . [DISCONTINUED] amLODipine (NORVASC) 10 MG tablet Take 1 tablet (10 mg total) by mouth daily.  . [DISCONTINUED] cholestyramine  (QUESTRAN) 4 g packet Take 1 packet (4 g total) by mouth 3 (three) times daily.  . [DISCONTINUED] losartan (COZAAR) 50 MG tablet Take 1 tablet (50 mg total) by mouth daily. (Patient taking differently: Take 200 mg by mouth daily. )    PHQ 2/9 Scores 12/27/2019 11/02/2019 06/02/2019 08/31/2018  PHQ - 2 Score 0 0 2 1  PHQ- 9 Score 0 1 11 -    GAD 7 : Generalized Anxiety Score 12/27/2019 11/02/2019  Nervous, Anxious, on Edge 0 0  Control/stop worrying 0 0  Worry too much - different things 0 0  Trouble relaxing 0 0  Restless 0 0  Easily annoyed or irritable 0 1  Afraid - awful might happen 0 0  Total GAD 7 Score 0 1  Anxiety Difficulty Not difficult at all Not difficult at all    BP Readings from Last 3 Encounters:  12/27/19 118/82  11/02/19 (!) 132/92  10/28/19 (!) 130/99    Physical Exam Vitals and nursing note reviewed.  Constitutional:      General: She is not in acute distress.    Appearance: Normal appearance. She is well-developed.  HENT:     Head: Normocephalic and atraumatic.  Cardiovascular:     Rate and Rhythm: Normal rate and regular rhythm.     Heart sounds: No murmur heard.   Pulmonary:     Effort: Pulmonary effort is normal. No respiratory distress.     Breath sounds: No wheezing or rhonchi.  Musculoskeletal:        General: Normal range of motion.     Cervical back: Normal range of motion.     Right lower leg: No edema.     Left lower leg: No edema.  Lymphadenopathy:     Cervical: No cervical adenopathy.  Skin:    General: Skin is warm and dry.     Capillary Refill: Capillary refill takes less than 2 seconds.     Findings: No rash.  Neurological:     General: No focal deficit present.     Mental Status: She is alert and oriented to person, place, and time.  Psychiatric:        Mood and Affect: Mood normal.        Behavior: Behavior normal.     Wt Readings from Last 3 Encounters:  12/27/19 245 lb (111.1 kg)  11/02/19 233 lb (105.7 kg)  10/15/19  222 lb 10.6 oz (101 kg)    BP 118/82   Pulse 96   Ht 6' (1.829 m)   Wt 245 lb (111.1 kg)   SpO2 95%   BMI 33.23 kg/m   Assessment and Plan: 1. Essential hypertension Improved control but on  losartan 200 mg a day Continue amlodipine 10 mg and reduce losartan to 50 mg bid Continue to monitor at home - amLODipine (NORVASC) 10 MG tablet; Take 1 tablet (10 mg total) by mouth daily.  Dispense: 90 tablet; Refill: 1 - losartan (COZAAR) 50 MG tablet; Take 1 tablet (50 mg total) by mouth 2 (two) times daily.  Dispense: 180 tablet; Refill: 1  2. Chronic diarrhea of unknown origin Much improved on Octreotide daily; actually mild constipation which can be treated with stool softener; recommend avoiding stimulant laxatives Seeing GI next week  3. Headache disorder Seen by Neurology felt to be a combination of post concussive and TGN On gabapentin and tegretol with marked improvement - no headache in the past week. Will continue same medications until seen by Neurology   Partially dictated using [place]. Any errors are unintentional.  Halina Maidens, MD [facility]  12/27/2019

## 2019-12-28 ENCOUNTER — Telehealth: Payer: Self-pay

## 2019-12-28 ENCOUNTER — Other Ambulatory Visit: Payer: Self-pay | Admitting: Internal Medicine

## 2019-12-28 DIAGNOSIS — M50322 Other cervical disc degeneration at C5-C6 level: Secondary | ICD-10-CM

## 2019-12-28 NOTE — Telephone Encounter (Signed)
Completed PA in covermymeds. Waiting on insurance approval.  Key: [id number] - Rx #: [id number]  KP

## 2019-12-30 NOTE — Telephone Encounter (Signed)
Insurance approved only 90 tablets for 90 days. Denied 180 tablets.  KP

## 2020-01-04 ENCOUNTER — Other Ambulatory Visit: Payer: Self-pay | Admitting: Gastroenterology

## 2020-01-04 DIAGNOSIS — K529 Noninfective gastroenteritis and colitis, unspecified: Secondary | ICD-10-CM

## 2020-01-20 ENCOUNTER — Ambulatory Visit: Payer: PRIVATE HEALTH INSURANCE | Admitting: Gastroenterology

## 2020-01-30 ENCOUNTER — Other Ambulatory Visit: Payer: Self-pay

## 2020-01-30 DIAGNOSIS — R110 Nausea: Secondary | ICD-10-CM

## 2020-01-30 MED ORDER — PROMETHAZINE HCL 25 MG PO TABS: 25.0000 mg | ORAL_TABLET | Freq: Three times a day (TID) | ORAL | 0 refills | Status: DC | PRN

## 2020-02-06 ENCOUNTER — Other Ambulatory Visit: Payer: Self-pay | Admitting: Gastroenterology

## 2020-02-06 DIAGNOSIS — K529 Noninfective gastroenteritis and colitis, unspecified: Secondary | ICD-10-CM

## 2020-02-06 NOTE — Telephone Encounter (Signed)
Last office visit 06/06/2019 Diarrhea  Last refill 01/04/2020 0 refills  Has appointment 03/02/2020

## 2020-02-20 ENCOUNTER — Other Ambulatory Visit: Payer: Self-pay | Admitting: Gastroenterology

## 2020-02-20 NOTE — Telephone Encounter (Signed)
Last office visit 06/06/2019 Last refill 12/22/2019 1 refills

## 2020-03-02 ENCOUNTER — Ambulatory Visit: Payer: PRIVATE HEALTH INSURANCE | Admitting: Gastroenterology

## 2020-03-06 ENCOUNTER — Other Ambulatory Visit: Payer: Self-pay | Admitting: Gastroenterology

## 2020-03-06 DIAGNOSIS — K529 Noninfective gastroenteritis and colitis, unspecified: Secondary | ICD-10-CM

## 2020-03-20 ENCOUNTER — Other Ambulatory Visit: Payer: Self-pay

## 2020-03-20 MED ORDER — HYDROXYZINE PAMOATE 25 MG PO CAPS: ORAL_CAPSULE | ORAL | 2 refills | Status: DC

## 2020-03-20 MED ORDER — GABAPENTIN 300 MG PO CAPS: 300.0000 mg | ORAL_CAPSULE | Freq: Two times a day (BID) | ORAL | 0 refills | Status: DC

## 2020-03-26 ENCOUNTER — Other Ambulatory Visit: Payer: Self-pay

## 2020-03-26 ENCOUNTER — Other Ambulatory Visit: Payer: Self-pay | Admitting: Internal Medicine

## 2020-03-26 ENCOUNTER — Ambulatory Visit (INDEPENDENT_AMBULATORY_CARE_PROVIDER_SITE_OTHER): Payer: PRIVATE HEALTH INSURANCE

## 2020-03-26 ENCOUNTER — Other Ambulatory Visit: Payer: Self-pay | Admitting: Gastroenterology

## 2020-03-26 DIAGNOSIS — M50322 Other cervical disc degeneration at C5-C6 level: Secondary | ICD-10-CM

## 2020-03-26 DIAGNOSIS — Z23 Encounter for immunization: Secondary | ICD-10-CM

## 2020-03-26 MED ORDER — CYCLOBENZAPRINE HCL 5 MG PO TABS: ORAL_TABLET | ORAL | 2 refills | Status: DC

## 2020-03-26 NOTE — Telephone Encounter (Signed)
Requested medication (s) are due for refill today:  Yes  Requested medication (s) are on the active medication list:  Yes  Future visit scheduled:  No  Last Refill: 12/28/19; #90; RF x 2  Notes to clinic: not delegated.  Requested Prescriptions  Pending Prescriptions Disp Refills   cyclobenzaprine (FLEXERIL) 5 MG tablet [Pharmacy Med Name: CYCLOBENZAPRINE HCL 5 MG TAB] 90 tablet 2    Sig: TAKE (1) TABLET BY MOUTH THREE TIMES A DAY      Not Delegated - Analgesics:  Muscle Relaxants Failed - 03/26/2020 12:36 PM      Failed - This refill cannot be delegated      Passed - Valid encounter within last 6 months    Recent Outpatient Visits           3 months ago Essential hypertension   [facility] Glean Hess, MD   4 months ago Essential hypertension   [facility] Glean Hess, MD   9 months ago Syncope, unspecified syncope type   [facility] Glean Hess, MD   9 months ago Chronic diarrhea   [facility] Glean Hess, MD   1 year ago Post-concussion headache   [facility] Glean Hess, MD       Future Appointments             In 1 month Vanga, Tally Due, MD [place]

## 2020-03-27 ENCOUNTER — Other Ambulatory Visit: Payer: Self-pay

## 2020-03-27 MED ORDER — ATORVASTATIN CALCIUM 20 MG PO TABS: ORAL_TABLET | ORAL | 1 refills | Status: DC

## 2020-03-27 NOTE — Telephone Encounter (Signed)
You filled this yesterday? Please take out

## 2020-04-03 ENCOUNTER — Other Ambulatory Visit: Payer: Self-pay | Admitting: Internal Medicine

## 2020-04-03 MED ORDER — ETONOGESTREL-ETHINYL ESTRADIOL 0.12-0.015 MG/24HR VA RING: VAGINAL_RING | VAGINAL | 3 refills | Status: DC

## 2020-04-23 ENCOUNTER — Other Ambulatory Visit: Payer: Self-pay | Admitting: Gastroenterology

## 2020-04-24 ENCOUNTER — Other Ambulatory Visit: Payer: Self-pay

## 2020-04-24 DIAGNOSIS — I10 Essential (primary) hypertension: Secondary | ICD-10-CM

## 2020-04-24 MED ORDER — LOSARTAN POTASSIUM 50 MG PO TABS: 50.0000 mg | ORAL_TABLET | Freq: Two times a day (BID) | ORAL | 1 refills | Status: DC

## 2020-04-25 ENCOUNTER — Telehealth: Payer: Self-pay

## 2020-04-25 NOTE — Telephone Encounter (Signed)
PA was completed waiting for approval.  Key: [id number]  KP

## 2020-04-30 NOTE — Telephone Encounter (Signed)
Called pt to see if she is taking losartan 50 MG twice a day? Pt insurance approved PA for medication but only for 30 tablets per 30 days.  Will route result note to [facility] Nurse Triage for follow up when patient returns call to clinic. Nurse may give results to patient if they return call. CRM created for this message.   KP

## 2020-04-30 NOTE — Telephone Encounter (Signed)
PA was approved for 30 tablets per 30 days. 04/27/2020-04/27/2021.  KP

## 2020-05-01 ENCOUNTER — Other Ambulatory Visit: Payer: Self-pay | Admitting: Internal Medicine

## 2020-05-01 DIAGNOSIS — I10 Essential (primary) hypertension: Secondary | ICD-10-CM

## 2020-05-01 MED ORDER — LOSARTAN POTASSIUM 100 MG PO TABS: 100.0000 mg | ORAL_TABLET | Freq: Every day | ORAL | 1 refills | Status: DC

## 2020-05-01 NOTE — Telephone Encounter (Signed)
Called pt and LM on VM to call back to clarify how she is taking her Losartan. Call back number provided.

## 2020-05-07 ENCOUNTER — Other Ambulatory Visit: Payer: Self-pay

## 2020-05-10 ENCOUNTER — Ambulatory Visit: Payer: PRIVATE HEALTH INSURANCE | Admitting: Gastroenterology

## 2020-05-21 ENCOUNTER — Other Ambulatory Visit: Payer: Self-pay | Admitting: Internal Medicine

## 2020-05-21 ENCOUNTER — Other Ambulatory Visit: Payer: Self-pay | Admitting: Gastroenterology

## 2020-06-20 ENCOUNTER — Encounter: Payer: Self-pay | Admitting: Internal Medicine

## 2020-06-25 ENCOUNTER — Other Ambulatory Visit: Payer: Self-pay

## 2020-06-25 DIAGNOSIS — I10 Essential (primary) hypertension: Secondary | ICD-10-CM

## 2020-06-25 MED ORDER — AMLODIPINE BESYLATE 10 MG PO TABS: 10.0000 mg | ORAL_TABLET | Freq: Every day | ORAL | 1 refills | Status: DC

## 2020-06-25 MED ORDER — ATORVASTATIN CALCIUM 20 MG PO TABS: ORAL_TABLET | ORAL | 1 refills | Status: DC

## 2020-07-18 ENCOUNTER — Other Ambulatory Visit: Payer: Self-pay | Admitting: Internal Medicine

## 2020-07-18 ENCOUNTER — Telehealth: Payer: Self-pay

## 2020-07-18 DIAGNOSIS — K602 Anal fissure, unspecified: Secondary | ICD-10-CM | POA: Insufficient documentation

## 2020-07-18 MED ORDER — LIDOCAINE-PRILOCAINE 2.5-2.5 % EX CREA
1.0000 | TOPICAL_CREAM | CUTANEOUS | 0 refills | Status: DC | PRN
Start: 2020-07-18 — End: 2024-02-24

## 2020-07-18 NOTE — Telephone Encounter (Signed)
PA complete waiting for insurance approval.   Key: [id number]  KP

## 2020-07-23 NOTE — Telephone Encounter (Signed)
Insurance denied coverage.  KP

## 2020-07-24 ENCOUNTER — Other Ambulatory Visit: Payer: Self-pay

## 2020-07-24 ENCOUNTER — Other Ambulatory Visit: Admission: RE | Admit: 2020-07-24 | Payer: 59 | Source: Home / Self Care

## 2020-07-24 ENCOUNTER — Other Ambulatory Visit
Admission: RE | Admit: 2020-07-24 | Discharge: 2020-07-24 | Disposition: A | Discharge status: Home / Self Care | Payer: 59 | Attending: Internal Medicine | Admitting: Internal Medicine

## 2020-07-24 DIAGNOSIS — R197 Diarrhea, unspecified: Secondary | ICD-10-CM | POA: Insufficient documentation

## 2020-07-24 LAB — RENAL FUNCTION PANEL
Albumin: 4.2 g/dL (ref 3.5–5.0)
Anion gap: 10 (ref 5–15)
BUN: 17 mg/dL (ref 6–20)
CO2: 25 mmol/L (ref 22–32)
Calcium: 9.5 mg/dL (ref 8.9–10.3)
Chloride: 104 mmol/L (ref 98–111)
Creatinine, Ser: 0.86 mg/dL (ref 0.44–1.00)
GFR, Estimated: >60 mL/min (ref >60)
Glucose, Bld: 112 mg/dL — ABNORMAL HIGH (ref 70–99)
Phosphorus: 3.5 mg/dL (ref 2.5–4.6)
Potassium: 4.3 mmol/L (ref 3.5–5.1)
Sodium: 139 mmol/L (ref 135–145)

## 2020-08-07 ENCOUNTER — Other Ambulatory Visit
Admission: RE | Admit: 2020-08-07 | Discharge: 2020-08-07 | Disposition: A | Discharge status: Home / Self Care | Payer: 59 | Attending: Internal Medicine | Admitting: Internal Medicine

## 2020-08-07 ENCOUNTER — Encounter: Payer: Self-pay | Admitting: Internal Medicine

## 2020-08-07 ENCOUNTER — Ambulatory Visit (INDEPENDENT_AMBULATORY_CARE_PROVIDER_SITE_OTHER): Payer: 59 | Admitting: Internal Medicine

## 2020-08-07 ENCOUNTER — Other Ambulatory Visit: Payer: Self-pay

## 2020-08-07 VITALS — BP 144/98 | HR 119 | Temp 98.1°F | Ht 72.0 in | Wt 241.0 lb

## 2020-08-07 DIAGNOSIS — K219 Gastro-esophageal reflux disease without esophagitis: Secondary | ICD-10-CM

## 2020-08-07 DIAGNOSIS — I10 Essential (primary) hypertension: Secondary | ICD-10-CM | POA: Diagnosis present

## 2020-08-07 DIAGNOSIS — G4730 Sleep apnea, unspecified: Secondary | ICD-10-CM | POA: Diagnosis not present

## 2020-08-07 LAB — CBC WITH DIFFERENTIAL/PLATELET
Abs Immature Granulocytes: 0.05 10*3/uL (ref 0.00–0.07)
Basophils Absolute: 0.1 10*3/uL (ref 0.0–0.1)
Basophils Relative: 1 %
Eosinophils Absolute: 0.1 10*3/uL (ref 0.0–0.5)
Eosinophils Relative: 1 %
HCT: 37 % (ref 36.0–46.0)
Hemoglobin: 12.5 g/dL (ref 12.0–15.0)
Immature Granulocytes: 1 %
Lymphocytes Relative: 21 %
Lymphs Abs: 2 10*3/uL (ref 0.7–4.0)
MCH: 27.4 pg (ref 26.0–34.0)
MCHC: 33.8 g/dL (ref 30.0–36.0)
MCV: 81.1 fL (ref 80.0–100.0)
Monocytes Absolute: 0.6 10*3/uL (ref 0.1–1.0)
Monocytes Relative: 6 %
Neutro Abs: 6.6 10*3/uL (ref 1.7–7.7)
Neutrophils Relative %: 70 %
Platelets: 406 10*3/uL — ABNORMAL HIGH (ref 150–400)
RBC: 4.56 MIL/uL (ref 3.87–5.11)
RDW: 14.8 % (ref 11.5–15.5)
WBC: 9.4 10*3/uL (ref 4.0–10.5)
nRBC: 0 % (ref 0.0–0.2)

## 2020-08-07 LAB — COMPREHENSIVE METABOLIC PANEL
ALT: 23 U/L (ref 0–44)
AST: 19 U/L (ref 15–41)
Albumin: 4.3 g/dL (ref 3.5–5.0)
Alkaline Phosphatase: 79 U/L (ref 38–126)
Anion gap: 10 (ref 5–15)
BUN: 17 mg/dL (ref 6–20)
CO2: 23 mmol/L (ref 22–32)
Calcium: 9.1 mg/dL (ref 8.9–10.3)
Chloride: 102 mmol/L (ref 98–111)
Creatinine, Ser: 0.81 mg/dL (ref 0.44–1.00)
GFR, Estimated: >60 mL/min (ref >60)
Glucose, Bld: 122 mg/dL — ABNORMAL HIGH (ref 70–99)
Potassium: 3.9 mmol/L (ref 3.5–5.1)
Sodium: 135 mmol/L (ref 135–145)
Total Bilirubin: 0.5 mg/dL (ref 0.3–1.2)
Total Protein: 7.8 g/dL (ref 6.5–8.1)

## 2020-08-07 LAB — TSH: TSH: 4.967 u[IU]/mL — ABNORMAL HIGH (ref 0.350–4.500)

## 2020-08-07 MED ORDER — OLMESARTAN MEDOXOMIL 40 MG PO TABS: 40.0000 mg | ORAL_TABLET | Freq: Every day | ORAL | 0 refills | Status: DC

## 2020-08-07 MED ORDER — PANTOPRAZOLE SODIUM 40 MG PO TBEC: 40.0000 mg | DELAYED_RELEASE_TABLET | Freq: Every day | ORAL | 1 refills | Status: DC

## 2020-08-07 NOTE — Progress Notes (Signed)
Date:  08/07/2020   Name:  Molly Cisneros   DOB:  [birth year]   MRN:  [id number]   Chief Complaint: Insomnia (Has been bothering her for X2 weeks /) and Hypertension  Hypertension This is a chronic problem. The problem has been gradually worsening since onset. The problem is uncontrolled. Associated symptoms include chest pain (with stressful situations) and shortness of breath. Past treatments include angiotensin blockers and calcium channel blockers (out of losartan one day for unclear reason - may be recalled). Compliance problems include diet and exercise.  There is no history of kidney disease, CAD/MI or CVA.  Gastroesophageal Reflux She complains of chest pain (with stressful situations) and heartburn. She reports no choking, no coughing or no wheezing. This is a recurrent problem. Associated symptoms include fatigue. She has tried a PPI (insurance only covers once a day rather than twice a day) for the symptoms. Past procedures include an abdominal ultrasound and an EGD.  Sleep disturbance - she finds herself waking up gasping - has to sit up on the side of the bed. Then falls asleep sitting up and has fallen a few times.  She has more vivid dreams.  More irritable but seeing psychiatrist later this month.   Results of the [place] flowsheet 08/07/2020  Sitting and reading 3  Watching TV 2  Sitting, inactive in a public place (e.g. a theatre or a meeting) 3  As a passenger in a car for an hour without a break 0  Lying down to rest in the afternoon when circumstances permit 3  Sitting and talking to someone 0  Sitting quietly after a lunch without alcohol 1  In a car, while stopped for a few minutes in traffic 0  Total score 12     Lab Results  Component Value Date   CREATININE 0.86 07/24/2020   BUN 17 07/24/2020   NA 139 07/24/2020   K 4.3 07/24/2020   CL 104 07/24/2020   CO2 25 07/24/2020   Lab Results  Component Value Date   CHOL 266 (H) 05/26/2017   HDL 40 (L) 05/26/2017    LDLCALC 147 (H) 05/26/2017   TRIG 394 (H) 05/26/2017   CHOLHDL 6.7 05/26/2017   Lab Results  Component Value Date   TSH 2.019 10/26/2019   Lab Results  Component Value Date   HGBA1C 5.7 (H) 10/26/2019   Lab Results  Component Value Date   WBC 9.8 10/19/2019   HGB 10.2 (L) 10/19/2019   HCT 30.0 (L) 10/19/2019   MCV 84.7 10/19/2019   PLT 367 10/19/2019   Lab Results  Component Value Date   ALT 40 10/15/2019   AST 34 10/15/2019   GGT 32 06/11/2019   ALKPHOS 70 10/15/2019   BILITOT 0.6 10/15/2019     Review of Systems  Constitutional: Positive for fatigue. Negative for chills, fever and unexpected weight change.  Respiratory: Positive for shortness of breath. Negative for cough, choking, chest tightness and wheezing.   Cardiovascular: Positive for chest pain (with stressful situations). Negative for leg swelling.  Gastrointestinal: Positive for heartburn. Negative for diarrhea and vomiting.  Neurological: Negative for dizziness, seizures and light-headedness.  Psychiatric/Behavioral: Positive for dysphoric mood (more irritable at times) and sleep disturbance. Negative for suicidal ideas. The patient is not nervous/anxious.     Patient Active Problem List   Diagnosis Date Noted  . Rectal fissure 07/18/2020  . Headache disorder 11/24/2019  . Blurred vision, left eye   . History of Clostridium difficile  colitis 07/16/2019  . Vitamin D deficiency 07/11/2019  . GERD (gastroesophageal reflux disease) 07/01/2019  . Depression with anxiety 07/01/2019  . Folate deficiency 06/30/2019  . B12 deficiency 06/30/2019  . Orthostasis 06/21/2019  . Hypochloremia   . Hyponatremia 02/13/2019  . Hypokalemia 02/05/2019  . Chronic diarrhea of unknown origin   . Essential hypertension 11/03/2018  . Hyperlipidemia, mixed 11/03/2018  . Degeneration of C5-C6 intervertebral disc 08/31/2018  . Herniation of left side of L4-L5 intervertebral disc 06/05/2017  . Obesity (BMI 30-39.9)  06/05/2017    Allergies  Allergen Reactions  . Shellfish Allergy Anaphylaxis  . Aloe Vera Dermatitis  . Latex     Other reaction(s): Unknown    Past Surgical History:  Procedure Laterality Date  . COLONOSCOPY WITH PROPOFOL N/A 12/23/2018   Procedure: COLONOSCOPY WITH PROPOFOL;  Surgeon: Lin Landsman, MD;  Location: [facility] ENDOSCOPY;  Service: Gastroenterology;  Laterality: N/A;  . ESOPHAGOGASTRODUODENOSCOPY (EGD) WITH PROPOFOL N/A 12/23/2018   Procedure: ESOPHAGOGASTRODUODENOSCOPY (EGD) WITH PROPOFOL;  Surgeon: Lin Landsman, MD;  Location: [facility] ENDOSCOPY;  Service: Gastroenterology;  Laterality: N/A;  . GIVENS CAPSULE STUDY N/A 06/14/2019   Procedure: GIVENS CAPSULE STUDY;  Surgeon: Lin Landsman, MD;  Location: [facility] ENDOSCOPY;  Service: Gastroenterology;  Laterality: N/A;  . GIVENS CAPSULE STUDY N/A 06/16/2019   Procedure: GIVENS CAPSULE STUDY;  Surgeon: Lin Landsman, MD;  Location: [facility] ENDOSCOPY;  Service: Gastroenterology;  Laterality: N/A;  . LAPAROSCOPIC APPENDECTOMY N/A 11/06/2018   Procedure: APPENDECTOMY LAPAROSCOPIC;  Surgeon: Herbert Pun, MD;  Location: [facility] ORS;  Service: General;  Laterality: N/A;    Social History   Tobacco Use  . Smoking status: Never Smoker  . Smokeless tobacco: Never Used  Vaping Use  . Vaping Use: Never used  Substance Use Topics  . Alcohol use: Yes    Alcohol/week: 2.0 standard drinks    Types: 2 Cans of beer per week  . Drug use: No     Medication list has been reviewed and updated.  Current Meds  Medication Sig  . ALPRAZolam (XANAX) 1 MG tablet Take 1 mg by mouth 3 (three) times daily as needed for anxiety.   Marland Kitchen amLODipine (NORVASC) 10 MG tablet Take 1 tablet (10 mg total) by mouth daily.  Marland Kitchen amphetamine-dextroamphetamine (ADDERALL XR) 30 MG 24 hr capsule Take 30 mg by mouth as needed.  Marland Kitchen atorvastatin (LIPITOR) 20 MG tablet TAKE (1) TABLET BY MOUTH EVERY DAY  . butalbital-acetaminophen-caffeine (FIORICET)  50-325-40 MG tablet Take 1 tablet by mouth every 6 (six) hours as needed for headache or migraine.  . cyclobenzaprine (FLEXERIL) 5 MG tablet TAKE (1) TABLET BY MOUTH THREE TIMES A DAY (Patient taking differently: as needed.)  . DULoxetine (CYMBALTA) 60 MG capsule Take 60 mg by mouth daily.   Marland Kitchen etonogestrel-ethinyl estradiol (NUVARING) 0.12-0.015 MG/24HR vaginal ring USE AS DIRECTED  . gabapentin (NEURONTIN) 300 MG capsule Take 1 capsule (300 mg total) by mouth 2 (two) times daily. (Patient taking differently: Take 300 mg by mouth as needed.)  . hydrOXYzine (VISTARIL) 25 MG capsule TAKE 1 TO 2 CAPSULES BY MOUTH EVERY 6 HOURS (Patient taking differently: as needed. TAKE 1 TO 2 CAPSULES BY MOUTH EVERY 6 HOURS)  . lidocaine-prilocaine (EMLA) cream Apply 1 application topically as needed.  . loperamide (IMODIUM) 2 MG capsule Take 1 capsule (2 mg total) by mouth as needed for diarrhea or loose stools.  . Octreotide Acetate 200 MCG/ML SOLN Inject 0.5 mLs (100 mcg total) as directed every 8 (eight)  hours as needed (diarrhea).  . pantoprazole (PROTONIX) 40 MG tablet Take 1 tablet (40 mg total) by mouth 2 (two) times daily before a meal.  . promethazine (PHENERGAN) 25 MG tablet Take 1 tablet (25 mg total) by mouth every 8 (eight) hours as needed for nausea or vomiting.  . valACYclovir (VALTREX) 1000 MG tablet Take 1,000 mg by mouth 2 (two) times daily as needed.  . [DISCONTINUED] losartan (COZAAR) 100 MG tablet Take 1 tablet (100 mg total) by mouth daily.    PHQ 2/9 Scores 08/07/2020 12/27/2019 11/02/2019 06/02/2019  PHQ - 2 Score 0 0 0 2  PHQ- 9 Score 3 0 1 11    GAD 7 : Generalized Anxiety Score 08/07/2020 12/27/2019 11/02/2019  Nervous, Anxious, on Edge 0 0 0  Control/stop worrying 0 0 0  Worry too much - different things 0 0 0  Trouble relaxing 0 0 0  Restless 0 0 0  Easily annoyed or irritable 0 0 1  Afraid - awful might happen 0 0 0  Total GAD 7 Score 0 0 1  Anxiety Difficulty - Not difficult at  all Not difficult at all    BP Readings from Last 3 Encounters:  08/07/20 (!) 144/98  12/27/19 118/82  11/02/19 (!) 132/92    Physical Exam Vitals and nursing note reviewed.  Constitutional:      General: She is not in acute distress.    Appearance: Normal appearance. She is well-developed.  HENT:     Head: Normocephalic and atraumatic.  Neck:     Vascular: No carotid bruit or JVD.  Cardiovascular:     Rate and Rhythm: Normal rate and regular rhythm.     Pulses: Normal pulses.     Heart sounds: No murmur heard.   Pulmonary:     Effort: Pulmonary effort is normal. No respiratory distress.     Breath sounds: No wheezing or rhonchi.  Musculoskeletal:        General: Normal range of motion.     Cervical back: Normal range of motion.     Right lower leg: No edema.     Left lower leg: No edema.  Lymphadenopathy:     Cervical: No cervical adenopathy.  Skin:    General: Skin is warm and dry.     Findings: No rash.  Neurological:     Mental Status: She is alert and oriented to person, place, and time.  Psychiatric:        Attention and Perception: Attention normal.        Mood and Affect: Mood normal.     Wt Readings from Last 3 Encounters:  08/07/20 241 lb (109.3 kg)  12/27/19 245 lb (111.1 kg)  11/02/19 233 lb (105.7 kg)    BP (!) 144/98   Pulse (!) 119   Temp 98.1 F (36.7 C) (Oral)   Ht 6' (1.829 m)   Wt 241 lb (109.3 kg)   SpO2 96%   BMI 32.69 kg/m   Assessment and Plan: 1. Essential hypertension Not controlled, likely due to untreated sleep apnea. Continue amlodipine and change to olmesartan. - olmesartan (BENICAR) 40 MG tablet; Take 1 tablet (40 mg total) by mouth daily.  Dispense: 30 tablet; Refill: 0  2. Sleep-disordered breathing - Ambulatory referral to Sleep Studies  3. Gastroesophageal reflux disease without esophagitis Symptoms well controlled on daily PPI - prefers bid but not covered No red flag signs such as weight loss, n/v,  melena Will continue pantoprazole once a day. -  pantoprazole (PROTONIX) 40 MG tablet; Take 1 tablet (40 mg total) by mouth daily.  Dispense: 90 tablet; Refill: 1   Partially dictated using [employer], [place]. Any errors are unintentional.  Halina Maidens, MD [facility]  08/07/2020

## 2020-09-03 ENCOUNTER — Other Ambulatory Visit: Payer: Self-pay | Admitting: Internal Medicine

## 2020-09-03 DIAGNOSIS — I10 Essential (primary) hypertension: Secondary | ICD-10-CM

## 2020-09-04 ENCOUNTER — Ambulatory Visit: Payer: 59 | Attending: Neurology

## 2020-09-04 DIAGNOSIS — G4733 Obstructive sleep apnea (adult) (pediatric): Secondary | ICD-10-CM | POA: Insufficient documentation

## 2020-09-05 ENCOUNTER — Other Ambulatory Visit: Payer: Self-pay

## 2020-09-10 ENCOUNTER — Other Ambulatory Visit: Payer: Self-pay | Admitting: Internal Medicine

## 2020-09-11 ENCOUNTER — Telehealth: Payer: Self-pay

## 2020-09-11 NOTE — Telephone Encounter (Signed)
Called [facility] to request the results for pt. Left VM to call back as well as fax number to fax the results.  KP

## 2020-09-17 ENCOUNTER — Telehealth: Payer: Self-pay

## 2020-09-17 ENCOUNTER — Other Ambulatory Visit: Payer: Self-pay | Admitting: Internal Medicine

## 2020-09-17 DIAGNOSIS — N3000 Acute cystitis without hematuria: Secondary | ICD-10-CM

## 2020-09-17 DIAGNOSIS — G4733 Obstructive sleep apnea (adult) (pediatric): Secondary | ICD-10-CM | POA: Insufficient documentation

## 2020-09-17 MED ORDER — CIPROFLOXACIN HCL 250 MG PO TABS: 250.0000 mg | ORAL_TABLET | Freq: Two times a day (BID) | ORAL | 0 refills | Status: AC

## 2020-09-17 NOTE — Telephone Encounter (Signed)
Called patient and informed of Sleep  Study Results.  Patient has Severe OSA. Added this to patients problem list. Informed patient Sleep Med recommendations below:  Return to sleep lab for sleep titrations, work on obtaining ideal body weight, avoid bedtime alcohol or sedatives, avoid driving while feeling drowsy, and avoid sleeping in supine position.   Called sleep med and left a VM for Lovena Le to call the office back about getting titration order sent to [place] so we can sign it and fax it back.   Patient informed of all this information.

## 2020-10-03 ENCOUNTER — Other Ambulatory Visit: Payer: Self-pay | Admitting: Internal Medicine

## 2020-10-03 DIAGNOSIS — I10 Essential (primary) hypertension: Secondary | ICD-10-CM

## 2020-10-04 ENCOUNTER — Ambulatory Visit: Payer: 59 | Attending: Neurology

## 2020-10-04 DIAGNOSIS — G4733 Obstructive sleep apnea (adult) (pediatric): Secondary | ICD-10-CM | POA: Insufficient documentation

## 2020-10-04 IMAGING — US US ABDOMEN LIMITED
1 series · 14 of 25 positions shown · non-contrast
Comparison: CT abdomen and pelvis July 10, 2004

CLINICAL DATA: Upper abdominal pain

EXAM:
ULTRASOUND ABDOMEN LIMITED RIGHT UPPER QUADRANT

[Series 1: us abdomen limited · 14 of 79 slices shown]
[im 1/79]
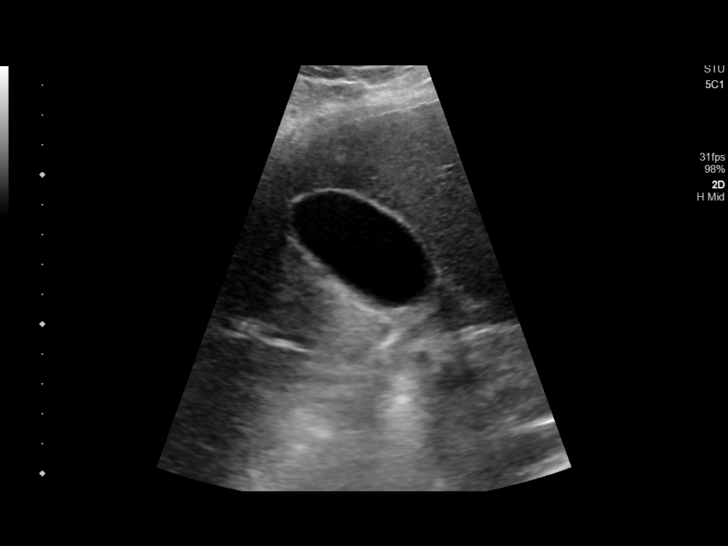
[im 7/79]
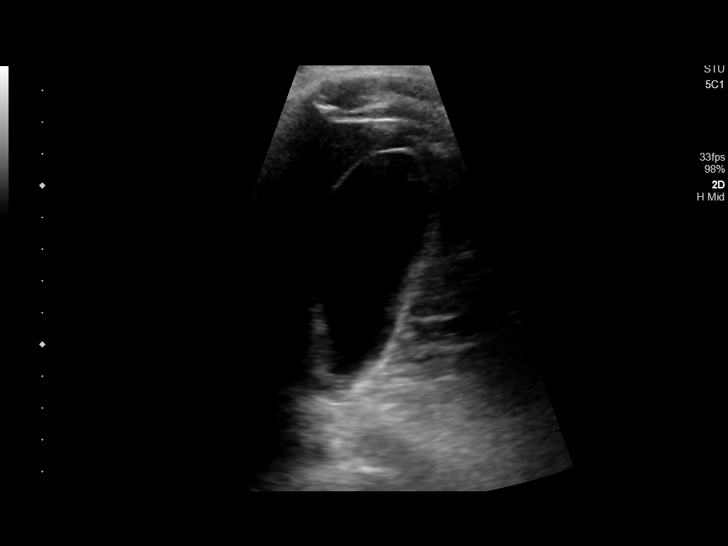
[im 14/79]
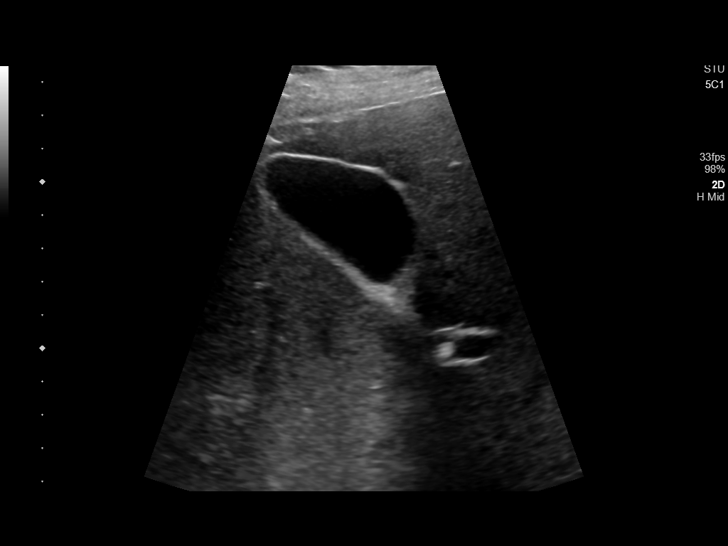
[im 20/79]
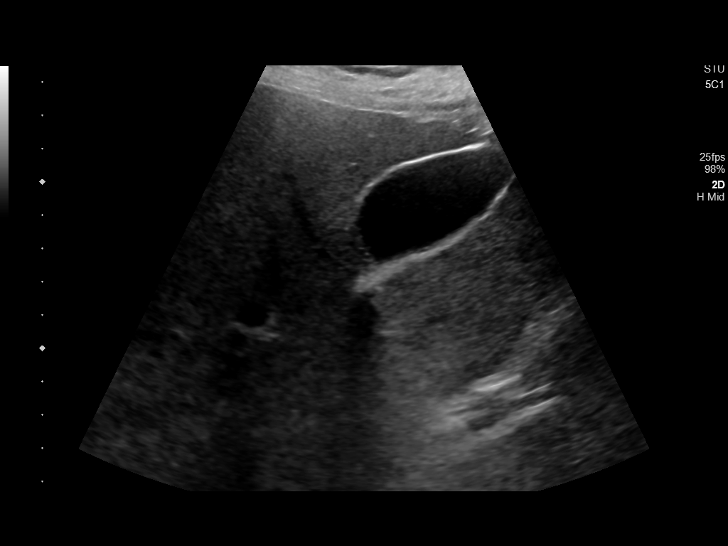
[im 27/79]
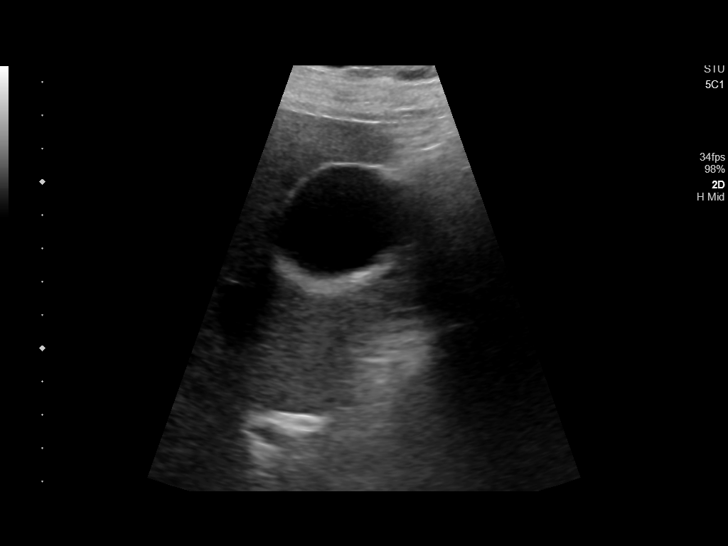
[im 30/79]
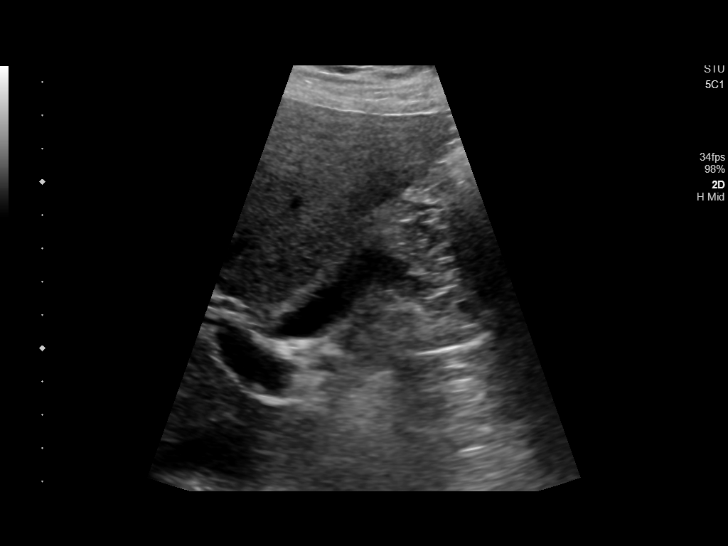
[im 36/79]
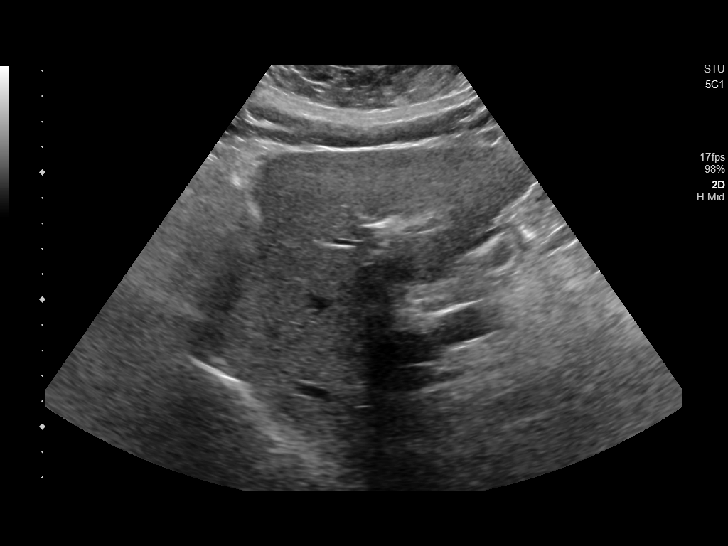
[im 43/79]
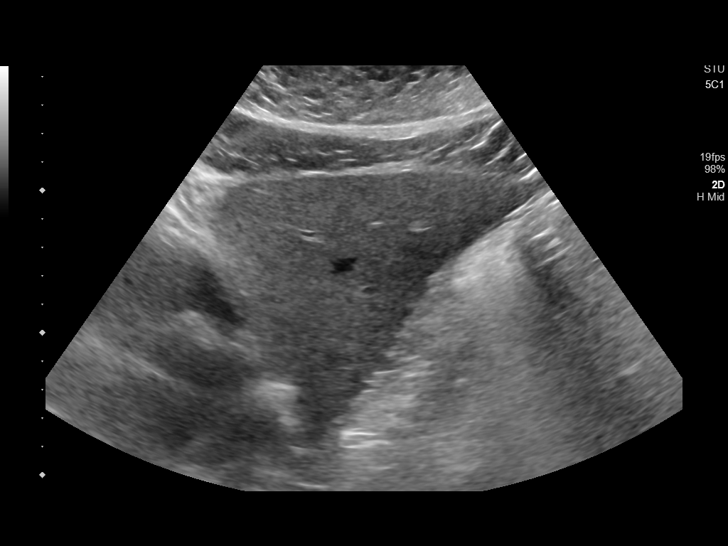
[im 49/79]
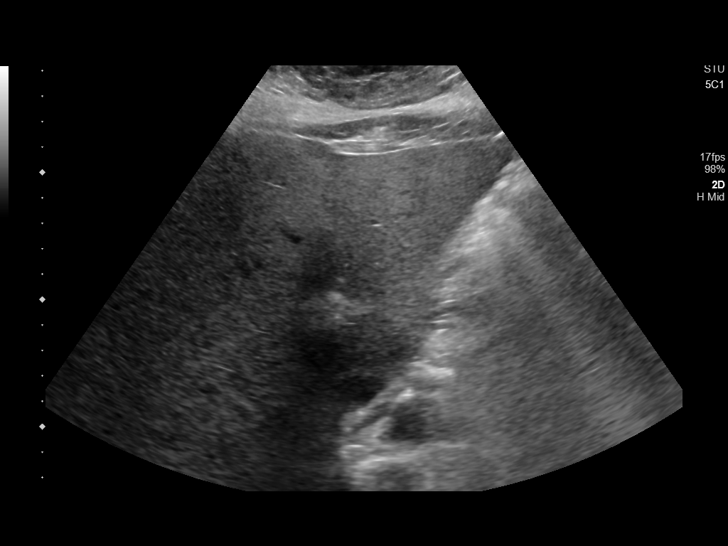
[im 53/79]
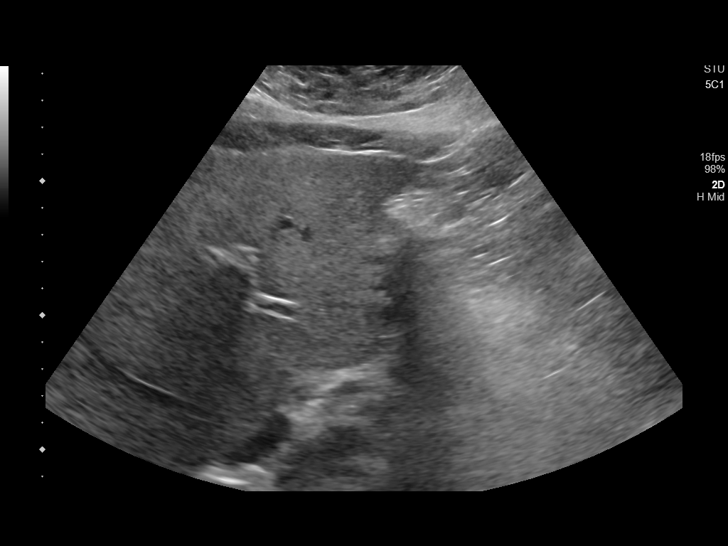
[im 59/79]
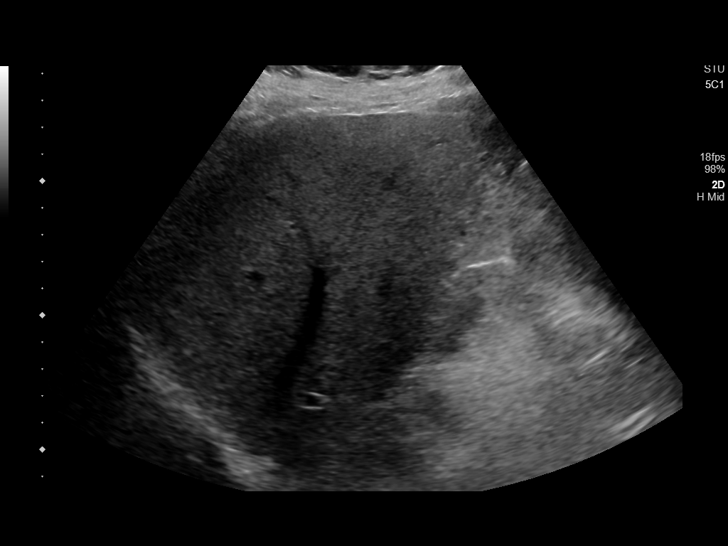
[im 66/79]
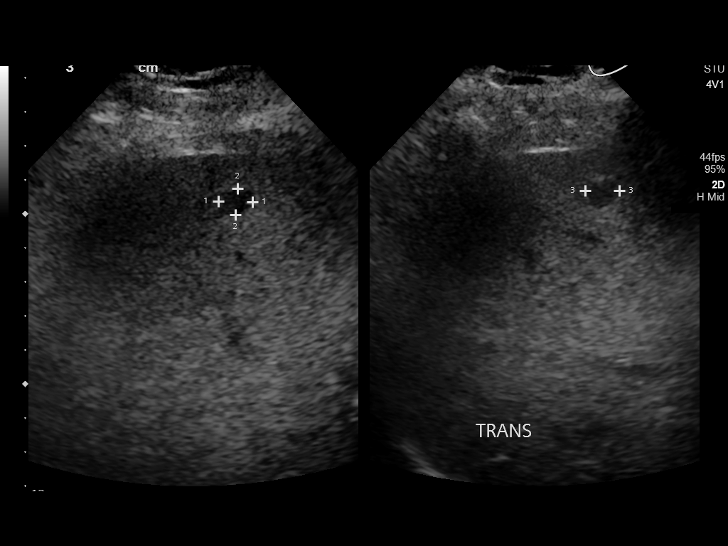
[im 72/79]
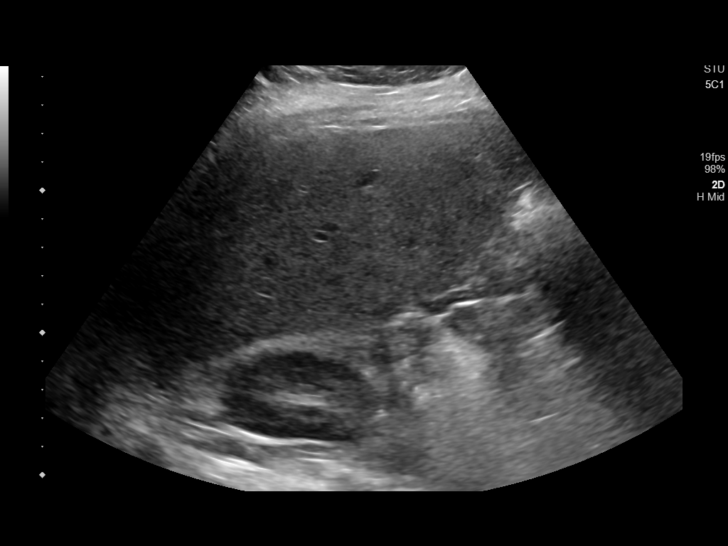
[im 79/79]
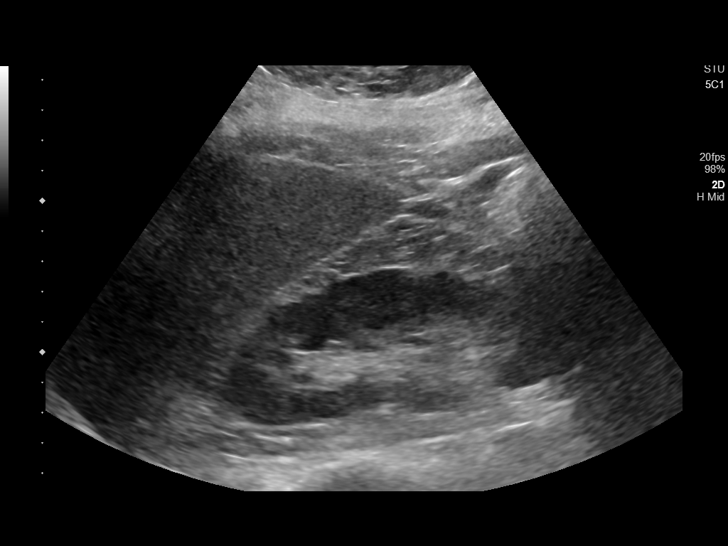

[14 of 25 positions shown; findings below may reference images not displayed]

FINDINGS: Gallbladder:

No gallstones or wall thickening visualized. There is no
pericholecystic fluid. No sonographic Murphy sign noted by
sonographer.

Common bile duct:

Diameter: 3 mm. No intrahepatic or extrahepatic biliary duct
dilatation.

Liver:

There is a cyst in the anterior segment of the right lobe of the
liver measuring 1.0 x 0.8 x 1.0 cm. The overall liver echogenicity
is increased. Portal vein is patent on color Doppler imaging with
normal direction of blood flow towards the liver.
IMPRESSION: Small cyst in the right lobe of the liver. No other focal liver
lesions are evident. There is increase in liver echogenicity, a
finding felt to be indicative of hepatic steatosis. It should be
noted that the sensitivity of ultrasound for detection of noncystic
lesions is diminished given underlying hepatic steatosis.

Study otherwise unremarkable.

## 2020-10-05 ENCOUNTER — Other Ambulatory Visit: Payer: Self-pay

## 2020-10-25 ENCOUNTER — Telehealth: Payer: Self-pay

## 2020-10-25 NOTE — Telephone Encounter (Signed)
Copied from [place] [phone]. Topic: General - Inquiry >> Oct 25, 2020  9:41 AM Loma Boston wrote: Foster Simpson  calling with [facility] and wanting info on pt sleep study. States has requested result of the diagnostic sleep study prior to the titration, no feed-back as of yet [phone]  ext [phone] Must have prior to CPAP machine

## 2020-10-25 NOTE — Telephone Encounter (Signed)
Sleep study printed and fax to [facility]. [phone] Fax #

## 2020-11-06 ENCOUNTER — Other Ambulatory Visit: Payer: Self-pay | Admitting: Internal Medicine

## 2020-11-06 DIAGNOSIS — I10 Essential (primary) hypertension: Secondary | ICD-10-CM

## 2020-11-12 ENCOUNTER — Telehealth: Payer: Self-pay

## 2020-11-12 NOTE — Telephone Encounter (Signed)
Faxed over patients insurance information to given number.

## 2020-11-12 NOTE — Telephone Encounter (Signed)
Copied from [place] [phone]. Topic: General - Other >> Nov 12, 2020  3:57 PM Pawlus, Brayton Layman A wrote: Reason for CRM: [employer] was calling in regard to the Pts CPAP machine, caller requested the pts insurance information to be faxed over if possible. FAX [phone]

## 2020-11-13 ENCOUNTER — Encounter: Payer: Self-pay | Admitting: Internal Medicine

## 2020-11-13 ENCOUNTER — Telehealth: Payer: Self-pay

## 2020-11-13 ENCOUNTER — Telehealth (INDEPENDENT_AMBULATORY_CARE_PROVIDER_SITE_OTHER): Payer: 59 | Admitting: Internal Medicine

## 2020-11-13 VITALS — Ht 72.0 in

## 2020-11-13 DIAGNOSIS — U071 COVID-19: Secondary | ICD-10-CM | POA: Diagnosis not present

## 2020-11-13 MED ORDER — HYDROCODONE BIT-HOMATROP MBR 5-1.5 MG/5ML PO SOLN: 5.0000 mL | Freq: Four times a day (QID) | ORAL | 0 refills | Status: AC | PRN

## 2020-11-13 MED ORDER — ALBUTEROL SULFATE HFA 108 (90 BASE) MCG/ACT IN AERS: 2.0000 | INHALATION_SPRAY | Freq: Four times a day (QID) | RESPIRATORY_TRACT | 0 refills | Status: DC | PRN

## 2020-11-13 MED ORDER — MOLNUPIRAVIR EUA 200MG CAPSULE: 4.0000 | ORAL_CAPSULE | Freq: Two times a day (BID) | ORAL | 0 refills | Status: AC

## 2020-11-13 NOTE — Telephone Encounter (Signed)
This visit type is being conducted due to national recommendations for restrictions regarding the COVID- 19 Pandemic (e.g. social distancing) in effort to limit this patients exposure and mitigate transmission in our community. This visit type is felt to be most appropriate for this patient at this time.  ° °I connected with the patient today and received telephone consent from the patient and patient understand this consent will be good for 1 year. Patient did not have any questions or concerns during this consent.  ° °KP °

## 2020-11-13 NOTE — [place] Notes (Signed)
Date:  11/13/2020   Name:  Molly Cisneros   DOB:  28-Feb-1986   MRN:  [id number]  This encounter was conducted via video encounter due to the need for social distancing in light of the Covid-19 pandemic.  The patient was correctly identified.  I advised that I am conducting the visit from a secure room in my office at [facility] clinic.  The patient is located at home. The limitations of this form of encounter were discussed with the patient and he/she agreed to proceed.  Some vital signs will be absent.  Video feed was interrupted so visit was converted to phone only.  Chief Complaint: Covid Positive (2 positive test today, cough when taking deep breaths, sore throat, body aches, headache, no apetite, chest tightness when breathing, SOB)  Cough This is a new problem. The current episode started yesterday. The problem occurs every few minutes. The cough is Non-productive. Associated symptoms include a fever (to 101.1), myalgias, a sore throat and shortness of breath (with stairs). Pertinent negatives include no wheezing. positive Covid test today   Lab Results  Component Value Date   CREATININE 0.81 08/07/2020   BUN 17 08/07/2020   NA 135 08/07/2020   K 3.9 08/07/2020   CL 102 08/07/2020   CO2 23 08/07/2020   Lab Results  Component Value Date   CHOL 266 (H) 05/26/2017   HDL 40 (L) 05/26/2017   LDLCALC 147 (H) 05/26/2017   TRIG 394 (H) 05/26/2017   CHOLHDL 6.7 05/26/2017   Lab Results  Component Value Date   TSH 4.967 (H) 08/07/2020   Lab Results  Component Value Date   HGBA1C 5.7 (H) 10/26/2019   Lab Results  Component Value Date   WBC 9.4 08/07/2020   HGB 12.5 08/07/2020   HCT 37.0 08/07/2020   MCV 81.1 08/07/2020   PLT 406 (H) 08/07/2020   Lab Results  Component Value Date   ALT 23 08/07/2020   AST 19 08/07/2020   GGT 32 06/11/2019   ALKPHOS 79 08/07/2020   BILITOT 0.5 08/07/2020     Review of Systems  Constitutional:  Positive for fever (to 101.1).  HENT:   Positive for sore throat.   Respiratory:  Positive for cough and shortness of breath (with stairs). Negative for wheezing.   Musculoskeletal:  Positive for myalgias.   Patient Active Problem List   Diagnosis Date Noted   OSA (obstructive sleep apnea) 09/17/2020   Rectal fissure 07/18/2020   Headache disorder 11/24/2019   Blurred vision, left eye    History of Clostridium difficile colitis 07/16/2019   Vitamin D deficiency 07/11/2019   GERD (gastroesophageal reflux disease) 07/01/2019   Depression with anxiety 07/01/2019   Folate deficiency 06/30/2019   B12 deficiency 06/30/2019   Orthostasis 06/21/2019   Hypochloremia    Hyponatremia 02/13/2019   Hypokalemia 02/05/2019   Chronic diarrhea of unknown origin    Essential hypertension 11/03/2018   Hyperlipidemia, mixed 11/03/2018   Degeneration of C5-C6 intervertebral disc 08/31/2018   Herniation of left side of L4-L5 intervertebral disc 06/05/2017   Obesity (BMI 30-39.9) 06/05/2017    Allergies  Allergen Reactions   Shellfish Allergy Anaphylaxis   Aloe Vera Dermatitis   Latex     Other reaction(s): Unknown    Past Surgical History:  Procedure Laterality Date   COLONOSCOPY WITH PROPOFOL N/A 12/23/2018   Procedure: COLONOSCOPY WITH PROPOFOL;  Surgeon: Lin Landsman, MD;  Location: [facility] ENDOSCOPY;  Service: Gastroenterology;  Laterality: N/A;  ESOPHAGOGASTRODUODENOSCOPY (EGD) WITH PROPOFOL N/A 12/23/2018   Procedure: ESOPHAGOGASTRODUODENOSCOPY (EGD) WITH PROPOFOL;  Surgeon: Lin Landsman, MD;  Location: [place];  Service: Gastroenterology;  Laterality: N/A;   GIVENS CAPSULE STUDY N/A 06/14/2019   Procedure: GIVENS CAPSULE STUDY;  Surgeon: Lin Landsman, MD;  Location: [facility] ENDOSCOPY;  Service: Gastroenterology;  Laterality: N/A;   GIVENS CAPSULE STUDY N/A 06/16/2019   Procedure: GIVENS CAPSULE STUDY;  Surgeon: Lin Landsman, MD;  Location: [facility] ENDOSCOPY;  Service: Gastroenterology;  Laterality: N/A;    LAPAROSCOPIC APPENDECTOMY N/A 11/06/2018   Procedure: APPENDECTOMY LAPAROSCOPIC;  Surgeon: Herbert Pun, MD;  Location: [facility] ORS;  Service: General;  Laterality: N/A;    Social History   Tobacco Use   Smoking status: Never   Smokeless tobacco: Never  Vaping Use   Vaping Use: Never used  Substance Use Topics   Alcohol use: Yes    Alcohol/week: 2.0 standard drinks    Types: 2 Cans of beer per week   Drug use: No     Medication list has been reviewed and updated.  Current Meds  Medication Sig   ALPRAZolam (XANAX) 1 MG tablet Take 1 mg by mouth 3 (three) times daily as needed for anxiety.    amLODipine (NORVASC) 10 MG tablet Take 1 tablet (10 mg total) by mouth daily.   amphetamine-dextroamphetamine (ADDERALL XR) 30 MG 24 hr capsule Take 30 mg by mouth as needed.   atorvastatin (LIPITOR) 20 MG tablet TAKE (1) TABLET BY MOUTH EVERY DAY   butalbital-acetaminophen-caffeine (FIORICET) 50-325-40 MG tablet Take 1 tablet by mouth every 6 (six) hours as needed for headache or migraine.   cyclobenzaprine (FLEXERIL) 5 MG tablet TAKE (1) TABLET BY MOUTH THREE TIMES A DAY (Patient taking differently: as needed.)   DULoxetine (CYMBALTA) 60 MG capsule Take 60 mg by mouth daily.    etonogestrel-ethinyl estradiol (NUVARING) 0.12-0.015 MG/24HR vaginal ring USE AS DIRECTED   gabapentin (NEURONTIN) 300 MG capsule Take 1 capsule (300 mg total) by mouth 2 (two) times daily. (Patient taking differently: Take 300 mg by mouth as needed.)   hydrOXYzine (VISTARIL) 25 MG capsule Take 1 capsule (25 mg total) by mouth as needed. TAKE 1 TO 2 CAPSULES BY MOUTH EVERY 6 HOURS   lidocaine-prilocaine (EMLA) cream Apply 1 application topically as needed.   loperamide (IMODIUM) 2 MG capsule Take 1 capsule (2 mg total) by mouth as needed for diarrhea or loose stools.   Octreotide Acetate 200 MCG/ML SOLN Inject 0.5 mLs (100 mcg total) as directed every 8 (eight) hours as needed (diarrhea).   olmesartan (BENICAR)  40 MG tablet TAKE (1) TABLET BY MOUTH EVERY DAY   pantoprazole (PROTONIX) 40 MG tablet Take 1 tablet (40 mg total) by mouth daily.   promethazine (PHENERGAN) 25 MG tablet Take 1 tablet (25 mg total) by mouth every 8 (eight) hours as needed for nausea or vomiting.   valACYclovir (VALTREX) 1000 MG tablet Take 1,000 mg by mouth 2 (two) times daily as needed.    PHQ 2/9 Scores 11/13/2020 08/07/2020 12/27/2019 11/02/2019  PHQ - 2 Score 0 0 0 0  PHQ- 9 Score 7 3 0 1    GAD 7 : Generalized Anxiety Score 11/13/2020 08/07/2020 12/27/2019 11/02/2019  Nervous, Anxious, on Edge 0 0 0 0  Control/stop worrying 0 0 0 0  Worry too much - different things 0 0 0 0  Trouble relaxing 0 0 0 0  Restless 0 0 0 0  Easily annoyed or irritable 0 0 0  1  Afraid - awful might happen 0 0 0 0  Total GAD 7 Score 0 0 0 1  Anxiety Difficulty - - Not difficult at all Not difficult at all    BP Readings from Last 3 Encounters:  08/07/20 (!) 144/98  12/27/19 118/82  11/02/19 (!) 132/92    Physical Exam Pulmonary:     Comments: Dry cough noted frequently during the call. Neurological:     Mental Status: She is alert.  Psychiatric:        Attention and Perception: Attention normal.        Mood and Affect: Mood normal.        Speech: Speech normal.        Cognition and Memory: Cognition normal.    Wt Readings from Last 3 Encounters:  08/07/20 241 lb (109.3 kg)  12/27/19 245 lb (111.1 kg)  11/02/19 233 lb (105.7 kg)    Ht 6' (1.829 m)   BMI 32.69 kg/m   Assessment and Plan: 1. COVID-19 virus infection Cautioned regarding worsening SOB or productive cough Take Tylenol around the clock for fever and body aches - albuterol (VENTOLIN HFA) 108 (90 Base) MCG/ACT inhaler; Inhale 2 puffs into the lungs every 6 (six) hours as needed for wheezing or shortness of breath.  Dispense: 1 each; Refill: 0 - molnupiravir EUA 200 mg CAPS; Take 4 capsules (800 mg total) by mouth 2 (two) times daily for 5 days.  Dispense: 40  capsule; Refill: 0 - HYDROcodone bit-homatropine (HYCODAN) 5-1.5 MG/5ML syrup; Take 5 mLs by mouth every 6 (six) hours as needed for up to 10 days for cough.  Dispense: 120 mL; Refill: 0  I spent 7 minutes on this encounter. Partially dictated using Editor, commissioning. Any errors are unintentional.  Halina Maidens, MD [facility]  11/13/2020

## 2020-11-16 ENCOUNTER — Telehealth: Payer: Self-pay

## 2020-11-16 NOTE — Telephone Encounter (Signed)
Patient said she has Covid. She feels her chest is very congested and she needs a good expectorant prescribed for her.  Please advise?

## 2020-11-16 NOTE — Telephone Encounter (Signed)
Patient informed. 

## 2020-11-29 ENCOUNTER — Other Ambulatory Visit: Payer: Self-pay

## 2020-11-29 ENCOUNTER — Telehealth: Payer: Self-pay

## 2020-11-29 DIAGNOSIS — Z3044 Encounter for surveillance of vaginal ring hormonal contraceptive device: Secondary | ICD-10-CM

## 2020-11-29 MED ORDER — ETONOGESTREL-ETHINYL ESTRADIOL 0.12-0.015 MG/24HR VA RING: VAGINAL_RING | VAGINAL | 1 refills | Status: DC

## 2020-11-29 NOTE — Telephone Encounter (Signed)
Pt requested nuvaring to be refilled- sent in with 1 refill. Chassidy out of office

## 2020-12-02 ENCOUNTER — Other Ambulatory Visit: Payer: Self-pay

## 2020-12-02 ENCOUNTER — Emergency Department
Admission: EM | Admit: 2020-12-02 | Discharge: 2020-12-02 | Disposition: A | Discharge status: Home / Self Care | Payer: 59 | Attending: Emergency Medicine | Admitting: Emergency Medicine

## 2020-12-02 ENCOUNTER — Ambulatory Visit
Admission: EM | Admit: 2020-12-02 | Discharge: 2020-12-02 | Disposition: A | Discharge status: Home / Self Care | Payer: No Typology Code available for payment source | Source: Ambulatory Visit | Attending: Emergency Medicine | Admitting: Emergency Medicine

## 2020-12-02 ENCOUNTER — Encounter: Payer: Self-pay | Admitting: Hematology and Oncology

## 2020-12-02 DIAGNOSIS — Z7289 Other problems related to lifestyle: Secondary | ICD-10-CM

## 2020-12-02 DIAGNOSIS — T7421XA Adult sexual abuse, confirmed, initial encounter: Secondary | ICD-10-CM | POA: Diagnosis not present

## 2020-12-02 DIAGNOSIS — Z79899 Other long term (current) drug therapy: Secondary | ICD-10-CM | POA: Diagnosis not present

## 2020-12-02 DIAGNOSIS — Z91040 Latex allergy status: Secondary | ICD-10-CM | POA: Diagnosis not present

## 2020-12-02 DIAGNOSIS — Z0441 Encounter for examination and observation following alleged adult rape: Secondary | ICD-10-CM | POA: Insufficient documentation

## 2020-12-02 DIAGNOSIS — I10 Essential (primary) hypertension: Secondary | ICD-10-CM | POA: Diagnosis not present

## 2020-12-02 DIAGNOSIS — X789XXA Intentional self-harm by unspecified sharp object, initial encounter: Secondary | ICD-10-CM | POA: Diagnosis not present

## 2020-12-02 DIAGNOSIS — S71111A Laceration without foreign body, right thigh, initial encounter: Secondary | ICD-10-CM | POA: Diagnosis not present

## 2020-12-02 DIAGNOSIS — S79921A Unspecified injury of right thigh, initial encounter: Secondary | ICD-10-CM | POA: Diagnosis present

## 2020-12-02 MED ORDER — ALPRAZOLAM 0.5 MG PO TABS
1.0000 mg | ORAL_TABLET | Freq: Once | ORAL | Status: AC
  Administered 2020-12-02: 1 mg via ORAL
  Filled 2020-12-02: qty 2

## 2020-12-02 MED ORDER — BACITRACIN ZINC 500 UNIT/GM EX OINT
TOPICAL_OINTMENT | Freq: Once | CUTANEOUS | Status: AC
  Administered 2020-12-02: 3 via TOPICAL
  Filled 2020-12-02: qty 2.7

## 2020-12-02 NOTE — ED Notes (Signed)
Pt reports that she is feeling much better and is ready for the SANE RN to start her exam

## 2020-12-02 NOTE — ED Notes (Signed)
Washed abrasions with sterile water and surgical soap then applied bacitracin to upper and lower thigh.
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
## 2020-12-02 NOTE — SANE Note (Signed)
At approximately 11:00 am, the SANE/FNE Naval architect) consult has been completed.  The primary RN, Georgina Peer has been notified, Please contact the SANE/FNE nurse on call (listed in [place]) with any further concerns.

## 2020-12-02 NOTE — ED Notes (Addendum)
Pt reports " I am a cutter" pt reports she cut her right leg with a razor, pt has superficial cuts to right thigh and right lower leg no bleeding noted. Pt denies any SI or HI.
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
## 2020-12-02 NOTE — ED Notes (Signed)
SANE RN has been discussing what her exam will entail, she has moved to the SANE room at this time

## 2020-12-12 ENCOUNTER — Other Ambulatory Visit: Payer: Self-pay

## 2020-12-12 DIAGNOSIS — I10 Essential (primary) hypertension: Secondary | ICD-10-CM

## 2020-12-12 MED ORDER — OLMESARTAN MEDOXOMIL 40 MG PO TABS: ORAL_TABLET | ORAL | 1 refills | Status: DC

## 2020-12-13 ENCOUNTER — Telehealth: Payer: Self-pay

## 2020-12-13 NOTE — Telephone Encounter (Signed)
They will refax these orders over to [place] to request records for patients insurance company. They need to verify that her problems are matching up with diagnosis codes, before they can pay for record claims. She said they are requesting records from 05/2019 to current.

## 2020-12-13 NOTE — Telephone Encounter (Signed)
Copied from [place] [id number]. Topic: General - Other >> Dec 13, 2020 10:16 AM Yvette Rack wrote: Reason for CRM: Eddie Dibbles with [employer] [employer] called for update on medical records request that was sent last week. Cb# [phone] for following: Outreach ID# [id number] and Outreach ID# [id number]

## 2021-01-07 ENCOUNTER — Other Ambulatory Visit: Payer: Self-pay

## 2021-01-07 ENCOUNTER — Other Ambulatory Visit: Payer: Self-pay | Admitting: Internal Medicine

## 2021-01-07 DIAGNOSIS — I10 Essential (primary) hypertension: Secondary | ICD-10-CM

## 2021-01-07 MED ORDER — AMLODIPINE BESYLATE 10 MG PO TABS: 10.0000 mg | ORAL_TABLET | Freq: Every day | ORAL | 1 refills | Status: DC

## 2021-01-07 MED ORDER — VALACYCLOVIR HCL 1 G PO TABS: 1000.0000 mg | ORAL_TABLET | Freq: Two times a day (BID) | ORAL | 5 refills | Status: DC | PRN

## 2021-02-12 ENCOUNTER — Encounter: Payer: Self-pay | Admitting: Internal Medicine

## 2021-02-12 ENCOUNTER — Other Ambulatory Visit: Payer: Self-pay

## 2021-02-12 ENCOUNTER — Ambulatory Visit (INDEPENDENT_AMBULATORY_CARE_PROVIDER_SITE_OTHER): Payer: 59 | Admitting: Internal Medicine

## 2021-02-12 ENCOUNTER — Telehealth: Payer: Self-pay

## 2021-02-12 VITALS — BP 136/74 | HR 122 | Ht 72.0 in | Wt 233.0 lb

## 2021-02-12 DIAGNOSIS — I10 Essential (primary) hypertension: Secondary | ICD-10-CM | POA: Diagnosis not present

## 2021-02-12 DIAGNOSIS — N920 Excessive and frequent menstruation with regular cycle: Secondary | ICD-10-CM

## 2021-02-12 DIAGNOSIS — R631 Polydipsia: Secondary | ICD-10-CM

## 2021-02-12 DIAGNOSIS — R000 Tachycardia, unspecified: Secondary | ICD-10-CM | POA: Insufficient documentation

## 2021-02-12 DIAGNOSIS — G4733 Obstructive sleep apnea (adult) (pediatric): Secondary | ICD-10-CM

## 2021-02-12 HISTORY — DX: Tachycardia, unspecified: R00.0

## 2021-02-12 MED ORDER — OLMESARTAN MEDOXOMIL 40 MG PO TABS: ORAL_TABLET | ORAL | 1 refills | Status: DC

## 2021-02-12 MED ORDER — HYDROXYZINE PAMOATE 25 MG PO CAPS: 25.0000 mg | ORAL_CAPSULE | ORAL | 2 refills | Status: DC | PRN

## 2021-02-12 NOTE — Progress Notes (Signed)
Date:  02/12/2021   Name:  Molly Cisneros   DOB:  [birth year]   MRN:  [id number]   Chief Complaint: Hypertension  Hypertension This is a chronic problem. The problem is controlled. Associated symptoms include palpitations. Pertinent negatives include no chest pain, headaches or shortness of breath. Past treatments include angiotensin blockers and calcium channel blockers. The current treatment provides significant improvement. There are no compliance problems.  There is no history of kidney disease, CAD/MI or CVA.  Palpitations  This is a chronic problem. The problem occurs constantly. Nothing aggravates the symptoms. Pertinent negatives include no anxiety, chest pain, coughing, dizziness, shortness of breath or weakness. She has tried nothing for the symptoms.  Vaginal Bleeding The patient's primary symptoms include vaginal bleeding (heaving bleeding). This is a new problem. The current episode started more than 1 month ago. The problem has been unchanged. The pain is moderate. Pertinent negatives include no abdominal pain, chills, constipation, diarrhea, dysuria, frequency or headaches.  OSA - unable to get insurance to cover a machine.  She has purchased one and has the mask and tubing but has not set it up yet.  Still falling asleep on the toilet during the night.  Lab Results  Component Value Date   CREATININE 0.81 08/07/2020   BUN 17 08/07/2020   NA 135 08/07/2020   K 3.9 08/07/2020   CL 102 08/07/2020   CO2 23 08/07/2020   Lab Results  Component Value Date   CHOL 266 (H) 05/26/2017   HDL 40 (L) 05/26/2017   LDLCALC 147 (H) 05/26/2017   TRIG 394 (H) 05/26/2017   CHOLHDL 6.7 05/26/2017   Lab Results  Component Value Date   TSH 4.967 (H) 08/07/2020   Lab Results  Component Value Date   HGBA1C 5.7 (H) 10/26/2019   Lab Results  Component Value Date   WBC 9.4 08/07/2020   HGB 12.5 08/07/2020   HCT 37.0 08/07/2020   MCV 81.1 08/07/2020   PLT 406 (H) 08/07/2020   Lab  Results  Component Value Date   ALT 23 08/07/2020   AST 19 08/07/2020   GGT 32 06/11/2019   ALKPHOS 79 08/07/2020   BILITOT 0.5 08/07/2020     Review of Systems  Constitutional:  Negative for chills, fatigue and unexpected weight change.  HENT:  Negative for nosebleeds.   Eyes:  Negative for visual disturbance.  Respiratory:  Negative for cough, chest tightness, shortness of breath and wheezing.   Cardiovascular:  Positive for palpitations. Negative for chest pain and leg swelling.  Gastrointestinal:  Negative for abdominal pain, constipation and diarrhea.  Endocrine: Positive for polydipsia and polyuria.  Genitourinary:  Positive for vaginal bleeding. Negative for dysuria and frequency.  Neurological:  Negative for dizziness, weakness, light-headedness and headaches.  Psychiatric/Behavioral:  Negative for dysphoric mood and sleep disturbance. The patient is not nervous/anxious.    Patient Active Problem List   Diagnosis Date Noted   OSA (obstructive sleep apnea) 09/17/2020   Rectal fissure 07/18/2020   Headache disorder 11/24/2019   Blurred vision, left eye    History of Clostridium difficile colitis 07/16/2019   Vitamin D deficiency 07/11/2019   GERD (gastroesophageal reflux disease) 07/01/2019   Depression with anxiety 07/01/2019   Folate deficiency 06/30/2019   B12 deficiency 06/30/2019   Orthostasis 06/21/2019   Hypochloremia    Hyponatremia 02/13/2019   Hypokalemia 02/05/2019   Chronic diarrhea of unknown origin    Essential hypertension 11/03/2018   Hyperlipidemia, mixed 11/03/2018  Degeneration of C5-C6 intervertebral disc 08/31/2018   Herniation of left side of L4-L5 intervertebral disc 06/05/2017   Obesity (BMI 30-39.9) 06/05/2017    Allergies  Allergen Reactions   Shellfish Allergy Anaphylaxis   Aloe Vera Dermatitis   Latex     Other reaction(s): Unknown    Past Surgical History:  Procedure Laterality Date   COLONOSCOPY WITH PROPOFOL N/A 12/23/2018    Procedure: COLONOSCOPY WITH PROPOFOL;  Surgeon: Lin Landsman, MD;  Location: [facility] ENDOSCOPY;  Service: Gastroenterology;  Laterality: N/A;   ESOPHAGOGASTRODUODENOSCOPY (EGD) WITH PROPOFOL N/A 12/23/2018   Procedure: ESOPHAGOGASTRODUODENOSCOPY (EGD) WITH PROPOFOL;  Surgeon: Lin Landsman, MD;  Location: [place];  Service: Gastroenterology;  Laterality: N/A;   GIVENS CAPSULE STUDY N/A 06/14/2019   Procedure: GIVENS CAPSULE STUDY;  Surgeon: Lin Landsman, MD;  Location: [facility] ENDOSCOPY;  Service: Gastroenterology;  Laterality: N/A;   GIVENS CAPSULE STUDY N/A 06/16/2019   Procedure: GIVENS CAPSULE STUDY;  Surgeon: Lin Landsman, MD;  Location: [facility] ENDOSCOPY;  Service: Gastroenterology;  Laterality: N/A;   LAPAROSCOPIC APPENDECTOMY N/A 11/06/2018   Procedure: APPENDECTOMY LAPAROSCOPIC;  Surgeon: Herbert Pun, MD;  Location: [facility] ORS;  Service: General;  Laterality: N/A;    Social History   Tobacco Use   Smoking status: Never   Smokeless tobacco: Never  Vaping Use   Vaping Use: Never used  Substance Use Topics   Alcohol use: Yes    Alcohol/week: 2.0 standard drinks    Types: 2 Cans of beer per week   Drug use: No     Medication list has been reviewed and updated.  Current Meds  Medication Sig   albuterol (VENTOLIN HFA) 108 (90 Base) MCG/ACT inhaler Inhale 2 puffs into the lungs every 6 (six) hours as needed for wheezing or shortness of breath.   ALPRAZolam (XANAX) 1 MG tablet Take 1 mg by mouth 3 (three) times daily as needed for anxiety.    amLODipine (NORVASC) 10 MG tablet Take 1 tablet (10 mg total) by mouth daily.   amphetamine-dextroamphetamine (ADDERALL XR) 30 MG 24 hr capsule Take 30 mg by mouth as needed.   atorvastatin (LIPITOR) 20 MG tablet TAKE (1) TABLET BY MOUTH EVERY DAY   butalbital-acetaminophen-caffeine (FIORICET) 50-325-40 MG tablet Take 1 tablet by mouth every 6 (six) hours as needed for headache or migraine.   cyclobenzaprine  (FLEXERIL) 5 MG tablet TAKE (1) TABLET BY MOUTH THREE TIMES A DAY (Patient taking differently: as needed.)   DULoxetine (CYMBALTA) 60 MG capsule Take 60 mg by mouth daily.    etonogestrel-ethinyl estradiol (NUVARING) 0.12-0.015 MG/24HR vaginal ring USE AS DIRECTED   gabapentin (NEURONTIN) 300 MG capsule Take 1 capsule (300 mg total) by mouth 2 (two) times daily. (Patient taking differently: Take 300 mg by mouth as needed.)   lidocaine-prilocaine (EMLA) cream Apply 1 application topically as needed.   loperamide (IMODIUM) 2 MG capsule Take 1 capsule (2 mg total) by mouth as needed for diarrhea or loose stools.   Octreotide Acetate 200 MCG/ML SOLN Inject 0.5 mLs (100 mcg total) as directed every 8 (eight) hours as needed (diarrhea).   pantoprazole (PROTONIX) 40 MG tablet Take 1 tablet (40 mg total) by mouth daily.   promethazine (PHENERGAN) 25 MG tablet Take 1 tablet (25 mg total) by mouth every 8 (eight) hours as needed for nausea or vomiting.   valACYclovir (VALTREX) 1000 MG tablet Take 1 tablet (1,000 mg total) by mouth 2 (two) times daily as needed.   [DISCONTINUED] hydrOXYzine (VISTARIL) 25 MG  capsule Take 1 capsule (25 mg total) by mouth as needed. TAKE 1 TO 2 CAPSULES BY MOUTH EVERY 6 HOURS   [DISCONTINUED] olmesartan (BENICAR) 40 MG tablet TAKE (1) TABLET BY MOUTH EVERY DAY    PHQ 2/9 Scores 02/12/2021 11/13/2020 08/07/2020 12/27/2019  PHQ - 2 Score 0 0 0 0  PHQ- 9 Score 6 7 3  0    GAD 7 : Generalized Anxiety Score 02/12/2021 11/13/2020 08/07/2020 12/27/2019  Nervous, Anxious, on Edge 3 0 0 0  Control/stop worrying 3 0 0 0  Worry too much - different things 0 0 0 0  Trouble relaxing 3 0 0 0  Restless 0 0 0 0  Easily annoyed or irritable 3 0 0 0  Afraid - awful might happen 0 0 0 0  Total GAD 7 Score 12 0 0 0  Anxiety Difficulty Very difficult - - Not difficult at all    BP Readings from Last 3 Encounters:  02/12/21 136/74  08/07/20 (!) 144/98  12/27/19 118/82    Physical  Exam Vitals and nursing note reviewed.  Constitutional:      General: She is not in acute distress.    Appearance: Normal appearance. She is well-developed.  HENT:     Head: Normocephalic and atraumatic.  Cardiovascular:     Rate and Rhythm: Normal rate and regular rhythm.     Pulses: Normal pulses.     Heart sounds: No murmur heard. Pulmonary:     Effort: Pulmonary effort is normal. No respiratory distress.     Breath sounds: No wheezing or rhonchi.  Musculoskeletal:     Cervical back: Normal range of motion.     Right lower leg: No edema.     Left lower leg: No edema.  Lymphadenopathy:     Cervical: No cervical adenopathy.  Skin:    General: Skin is warm and dry.     Findings: No rash.  Neurological:     General: No focal deficit present.     Mental Status: She is alert and oriented to person, place, and time.  Psychiatric:        Mood and Affect: Mood normal.        Behavior: Behavior normal.    Wt Readings from Last 3 Encounters:  02/12/21 233 lb (105.7 kg)  08/07/20 241 lb (109.3 kg)  12/27/19 245 lb (111.1 kg)    BP 136/74   Pulse (!) 122   Ht 6' (1.829 m)   Wt 233 lb (105.7 kg)   SpO2 94%   BMI 31.60 kg/m   Assessment and Plan: 1. Essential hypertension Clinically stable exam with well controlled BP. Tolerating medications without side effects at this time. Pt to continue current regimen and low sodium diet; benefits of regular exercise as able discussed. - CBC with Differential/Platelet - olmesartan (BENICAR) 40 MG tablet; TAKE (1) TABLET BY MOUTH EVERY DAY  Dispense: 90 tablet; Refill: 1  2. Polydipsia Check labs, rule out electrolyte abnormality, DM - Comprehensive metabolic panel - Hemoglobin A1c  3. Tachycardia Check labs and refer to cardiology - Ambulatory referral to Cardiology - TSH + free T4  4. OSA (obstructive sleep apnea) Begin CPAP use asap  5. Menorrhagia with regular cycle Check CBC - Ambulatory referral to Obstetrics /  Gynecology   Partially dictated using Dragon software. Any errors are unintentional.  Halina Maidens, MD [facility]  02/12/2021

## 2021-02-12 NOTE — Telephone Encounter (Signed)
[facility] referring for Menorrhagia with regular cycle. Phone on file not taking calls no voicemail.

## 2021-02-13 ENCOUNTER — Telehealth: Payer: Self-pay | Admitting: Internal Medicine

## 2021-02-13 LAB — COMPREHENSIVE METABOLIC PANEL
ALT: 21 IU/L (ref 0–32)
AST: 15 IU/L (ref 0–40)
Albumin/Globulin Ratio: 1.6 (ref 1.2–2.2)
Albumin: 4.8 g/dL (ref 3.8–4.8)
Alkaline Phosphatase: 100 IU/L (ref 44–121)
BUN/Creatinine Ratio: 13 (ref 9–23)
BUN: 13 mg/dL (ref 6–20)
Bilirubin Total: 0.4 mg/dL (ref 0.0–1.2)
CO2: 19 mmol/L — ABNORMAL LOW (ref 20–29)
Calcium: 10.2 mg/dL (ref 8.7–10.2)
Chloride: 100 mmol/L (ref 96–106)
Creatinine, Ser: 1 mg/dL (ref 0.57–1.00)
Globulin, Total: 3 g/dL (ref 1.5–4.5)
Glucose: 106 mg/dL — ABNORMAL HIGH (ref 70–99)
Potassium: 5 mmol/L (ref 3.5–5.2)
Sodium: 143 mmol/L (ref 134–144)
Total Protein: 7.8 g/dL (ref 6.0–8.5)
eGFR: 75 mL/min/{1.73_m2} (ref >59)

## 2021-02-13 LAB — CBC WITH DIFFERENTIAL/PLATELET
Basophils Absolute: 0.1 10*3/uL (ref 0.0–0.2)
Basos: 1 %
EOS (ABSOLUTE): 0.1 10*3/uL (ref 0.0–0.4)
Eos: 1 %
Hematocrit: 43.4 % (ref 34.0–46.6)
Hemoglobin: 13.8 g/dL (ref 11.1–15.9)
Immature Grans (Abs): 0 10*3/uL (ref 0.0–0.1)
Immature Granulocytes: 0 %
Lymphocytes Absolute: 3.1 10*3/uL (ref 0.7–3.1)
Lymphs: 32 %
MCH: 27.9 pg (ref 26.6–33.0)
MCHC: 31.8 g/dL (ref 31.5–35.7)
MCV: 88 fL (ref 79–97)
Monocytes Absolute: 0.6 10*3/uL (ref 0.1–0.9)
Monocytes: 6 %
Neutrophils Absolute: 5.9 10*3/uL (ref 1.4–7.0)
Neutrophils: 60 %
Platelets: 543 10*3/uL — ABNORMAL HIGH (ref 150–450)
RBC: 4.94 x10E6/uL (ref 3.77–5.28)
RDW: 13.4 % (ref 11.7–15.4)
WBC: 9.8 10*3/uL (ref 3.4–10.8)

## 2021-02-13 LAB — TSH+FREE T4
Free T4: 1.08 ng/dL (ref 0.82–1.77)
TSH: 4.34 u[IU]/mL (ref 0.450–4.500)

## 2021-02-13 LAB — HEMOGLOBIN A1C
Est. average glucose Bld gHb Est-mCnc: 111 mg/dL
Hgb A1c MFr Bld: 5.5 % (ref 4.8–5.6)

## 2021-02-13 NOTE — Telephone Encounter (Signed)
Called and left voicemail for patient to call back to be scheduled. 

## 2021-02-13 NOTE — Telephone Encounter (Signed)
Butch Penny, from Dr. Estill Bamberg office calling stating that they do not accept pts insurance, [employer]. She recommended [facility]. Please advise.    [phone]

## 2021-02-14 NOTE — Telephone Encounter (Signed)
Called and left voicemail for patient to call back to be scheduled. 

## 2021-02-19 ENCOUNTER — Encounter: Payer: Self-pay | Admitting: Hematology and Oncology

## 2021-02-19 NOTE — Telephone Encounter (Signed)
Called and left voicemail for patient to call back to be scheduled. 

## 2021-02-19 NOTE — Telephone Encounter (Signed)
Attempts to reach patient were unsuccessful. Contacting referring providers.

## 2021-03-21 ENCOUNTER — Encounter: Payer: BLUE CROSS/BLUE SHIELD | Admitting: Obstetrics and Gynecology

## 2021-04-23 ENCOUNTER — Other Ambulatory Visit: Payer: Self-pay

## 2021-04-23 MED ORDER — ATORVASTATIN CALCIUM 20 MG PO TABS: ORAL_TABLET | ORAL | 1 refills | Status: DC

## 2021-05-28 IMAGING — CT CT ABDOMEN AND PELVIS WITH CONTRAST
2 of 4 series · 15 of 46 positions shown, 17 images · IV contrast (APPLIED)
Comparison: None.

CLINICAL DATA: Periumbilical and right lower quadrant abdominal
pain today.

EXAM:
CT ABDOMEN AND PELVIS WITH CONTRAST
TECHNIQUE: Multidetector CT imaging of the abdomen and pelvis was performed
using the standard protocol following bolus administration of
intravenous contrast.
CONTRAST:  100mL OMNIPAQUE IOHEXOL 300 MG/ML  SOLN

[Series 2: routine abd/pel with · axial · 0.91mm/px · z∈[-571,-66]mm · 12 of 111 slices shown, 14 images]
[im 5/111  soft-tissue]
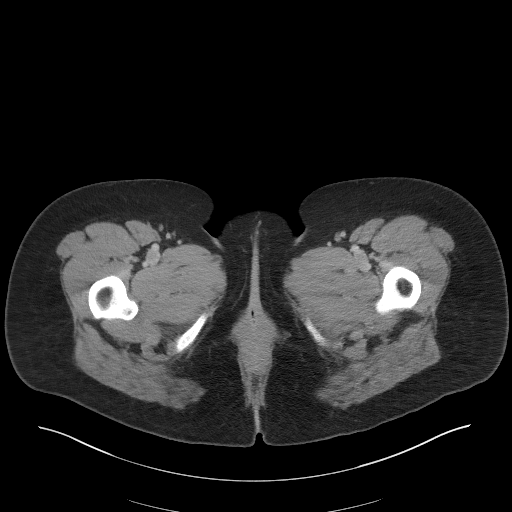
[im 5/111  bone]
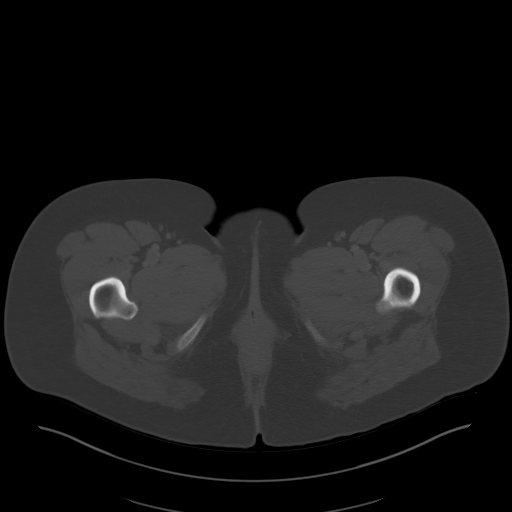
[im 15/111  soft-tissue]
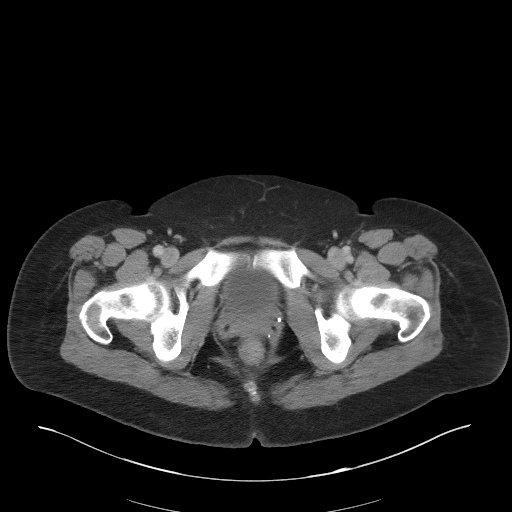
[im 24/111  soft-tissue]
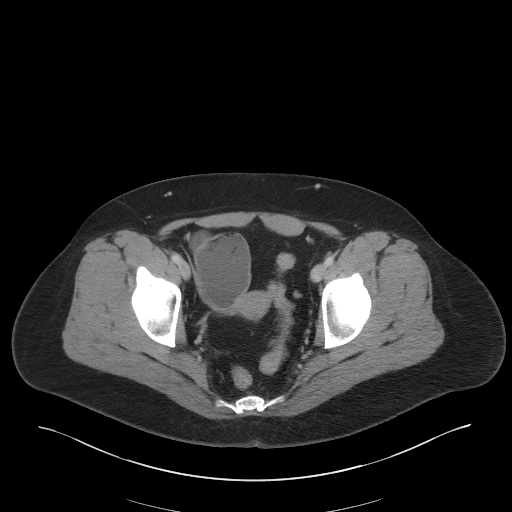
[im 34/111  soft-tissue]
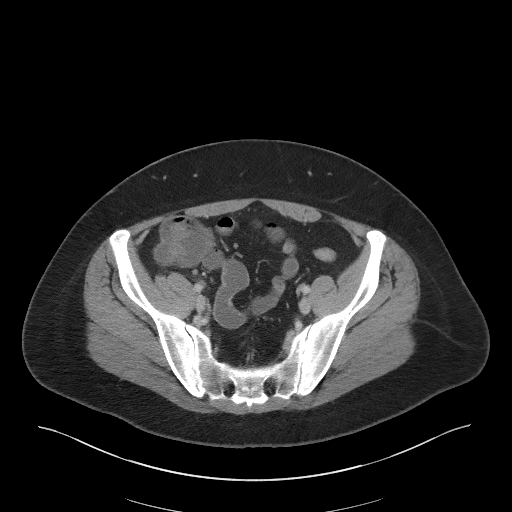
[im 44/111  soft-tissue]
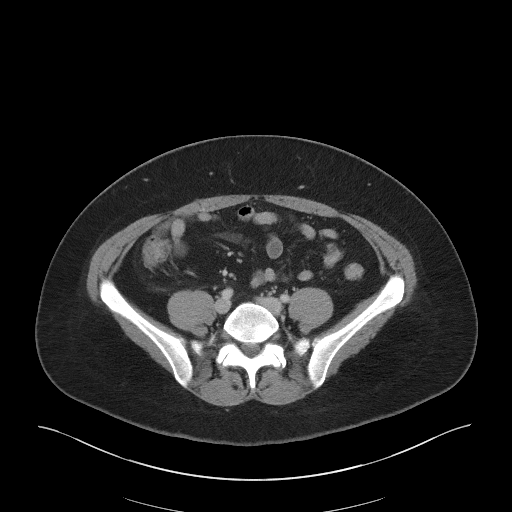
[im 53/111  soft-tissue]
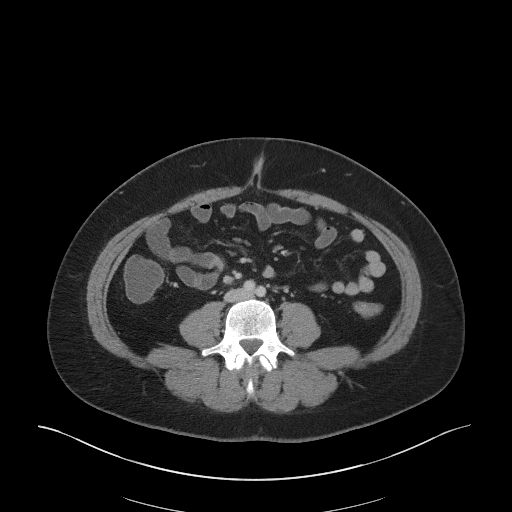
[im 58/111  soft-tissue]
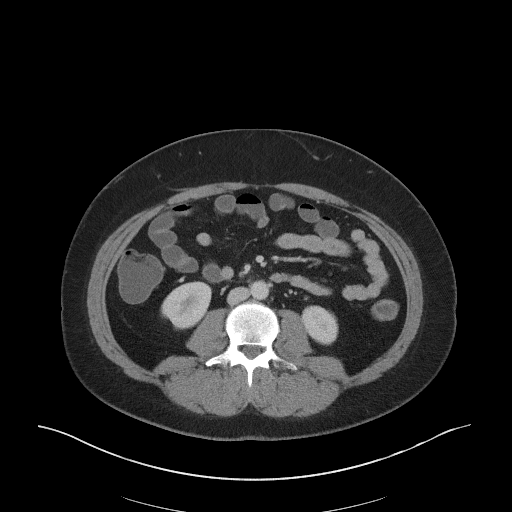
[im 67/111  soft-tissue]
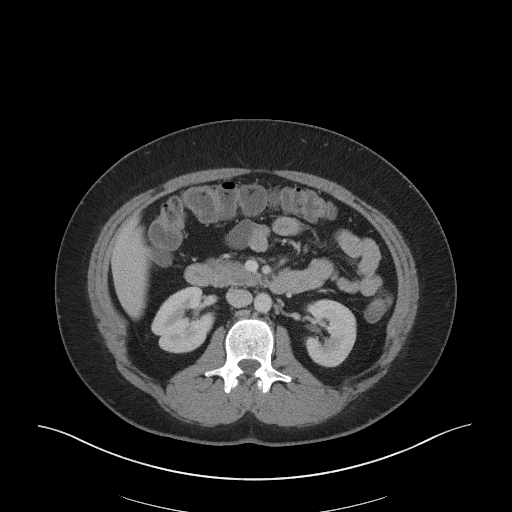
[im 77/111  soft-tissue]
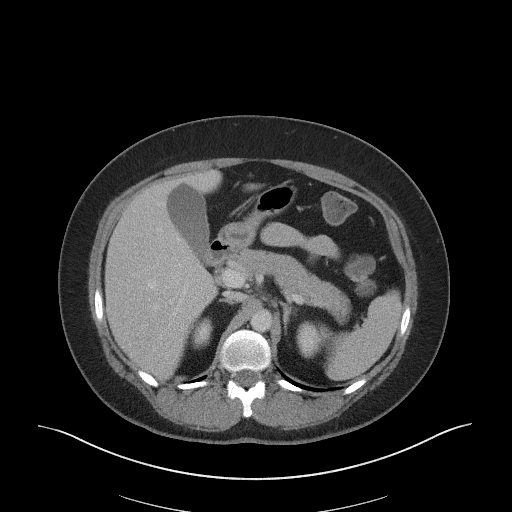
[im 77/111  bone]
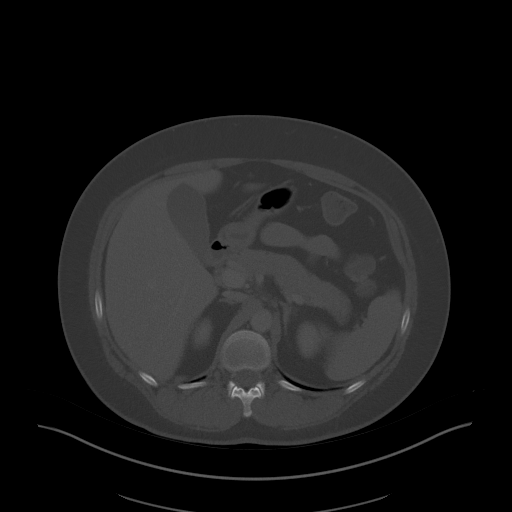
[im 87/111  soft-tissue]
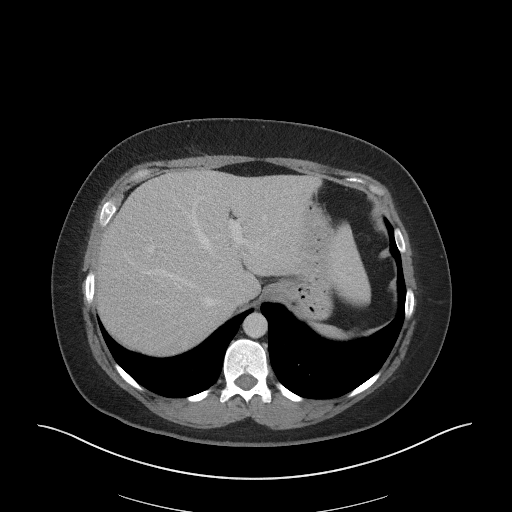
[im 96/111  soft-tissue]
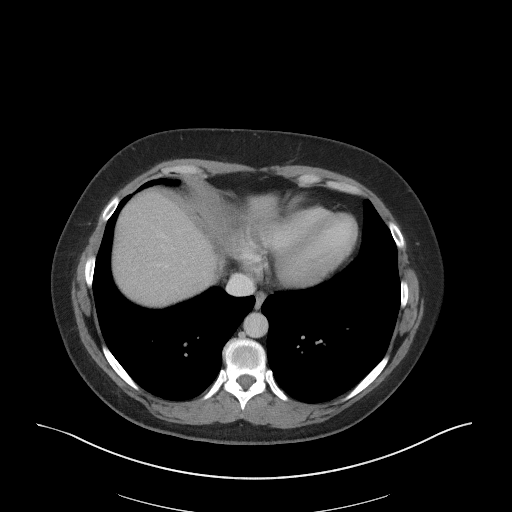
[im 106/111  soft-tissue]
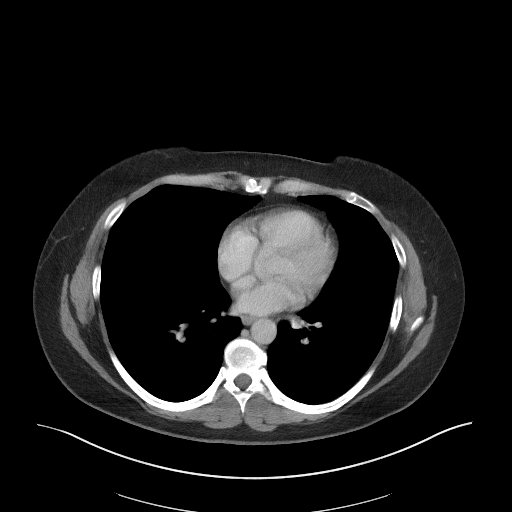

[Series 5: coronal st · coronal · 0.76mm/px · 3 of 88 slices shown]
[im 30/88  soft-tissue]
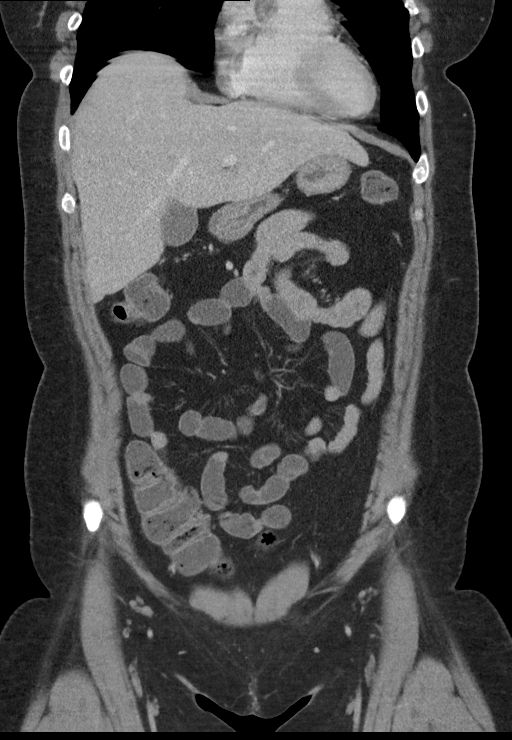
[im 39/88  soft-tissue]
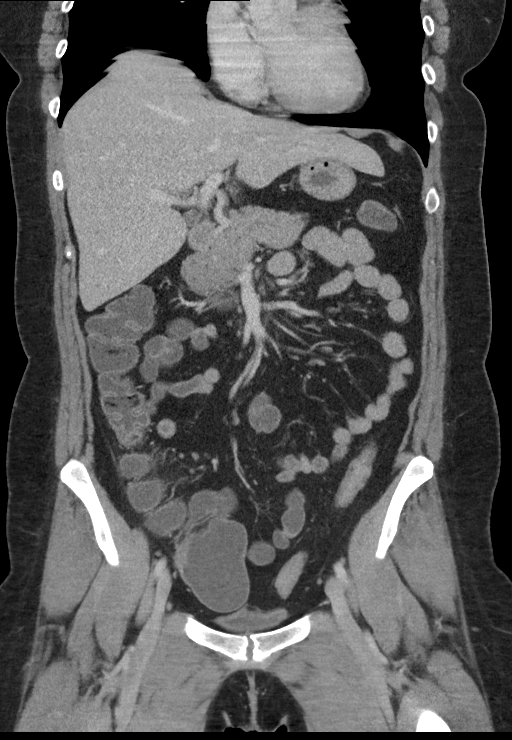
[im 49/88  soft-tissue]
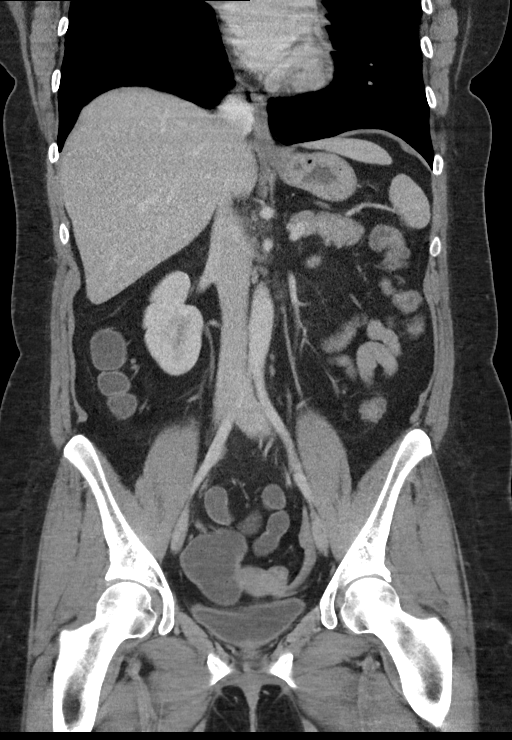

[15 of 46 positions shown; findings below may reference images not displayed]

FINDINGS: Lower chest: The lung bases are clear of acute process. No pleural
effusion or pulmonary lesions. The heart is normal in size. No
pericardial effusion. The distal esophagus and aorta are
unremarkable.

Hepatobiliary: No focal hepatic lesions or intrahepatic biliary
dilatation. The gallbladder is normal. No common bile duct
dilatation.

Pancreas: No mass, inflammation or ductal dilatation.

Spleen: Normal size.  No focal lesions.

Adrenals/Urinary Tract: The adrenal glands and kidneys are normal.
The bladder is normal.

Stomach/Bowel: The stomach, duodenum, small bowel and terminal ileum
are unremarkable.

There is moderate fluid in the colon which can be seen with
diarrhea. No wall thickening, mass or obstruction. No diverticular
disease.

The mid distal aspect of the appendix appears dilated and inflamed
with fairly extensive mucosal and serosal enhancement and
periappendiceal interstitial changes. No appendicoliths and the
appendix is not fluid-filled.

Vascular/Lymphatic: The aorta is normal in caliber. No dissection.
The branch vessels are patent. The major venous structures are
patent. No mesenteric or retroperitoneal mass or adenopathy. Small
scattered lymph nodes are noted.

Reproductive: The uterus and ovaries are unremarkable.

Other: No pelvic mass or adenopathy. No free pelvic fluid
collections. No inguinal mass or adenopathy. No abdominal wall
hernia or subcutaneous lesions.

Musculoskeletal: No significant bony findings.
IMPRESSION: 1. Enlarged and inflamed appendix with periappendiceal inflammatory
changes also. No appendicoliths.
2. Moderate fluid in the colon.
3. No other significant abdominal/pelvic findings.

## 2021-05-29 ENCOUNTER — Other Ambulatory Visit: Payer: Self-pay | Admitting: Internal Medicine

## 2021-05-29 ENCOUNTER — Other Ambulatory Visit: Payer: Self-pay

## 2021-05-29 DIAGNOSIS — Z3044 Encounter for surveillance of vaginal ring hormonal contraceptive device: Secondary | ICD-10-CM

## 2021-05-29 DIAGNOSIS — K219 Gastro-esophageal reflux disease without esophagitis: Secondary | ICD-10-CM

## 2021-05-29 MED ORDER — PANTOPRAZOLE SODIUM 40 MG PO TBEC: 40.0000 mg | DELAYED_RELEASE_TABLET | Freq: Every day | ORAL | 1 refills | Status: DC

## 2021-05-29 MED ORDER — ETONOGESTREL-ETHINYL ESTRADIOL 0.12-0.015 MG/24HR VA RING: VAGINAL_RING | VAGINAL | 1 refills | Status: DC

## 2021-05-29 NOTE — Telephone Encounter (Signed)
Requested Prescriptions  Pending Prescriptions Disp Refills   pantoprazole (PROTONIX) 40 MG tablet [Pharmacy Med Name: PANTOPRAZOLE SODIUM 40 MG DR TAB] 90 tablet 1    Sig: TAKE (1) TABLET BY MOUTH EVERY DAY     Gastroenterology: Proton Pump Inhibitors Passed - 05/29/2021 11:25 AM      Passed - Valid encounter within last 12 months    Recent Outpatient Visits          3 months ago Essential hypertension   [facility], [facility]   6 months ago COVID-19 virus infection   [facility] Glean Hess, MD   9 months ago Sleep-disordered breathing   [facility] Glean Hess, MD   1 year ago Essential hypertension   [facility] Glean Hess, MD   1 year ago Essential hypertension   [facility] Glean Hess, MD

## 2021-06-19 ENCOUNTER — Other Ambulatory Visit: Payer: Self-pay | Admitting: Internal Medicine

## 2021-06-19 DIAGNOSIS — J029 Acute pharyngitis, unspecified: Secondary | ICD-10-CM

## 2021-06-19 MED ORDER — AZITHROMYCIN 250 MG PO TABS: ORAL_TABLET | ORAL | 0 refills | Status: AC

## 2021-07-22 ENCOUNTER — Telehealth: Payer: Self-pay | Admitting: Internal Medicine

## 2021-07-22 DIAGNOSIS — U071 COVID-19: Secondary | ICD-10-CM

## 2021-07-22 MED ORDER — MOLNUPIRAVIR EUA 200MG CAPSULE: 4.0000 | ORAL_CAPSULE | Freq: Two times a day (BID) | ORAL | 0 refills | Status: AC

## 2021-07-22 MED ORDER — PROMETHAZINE-DM 6.25-15 MG/5ML PO SYRP: 5.0000 mL | ORAL_SOLUTION | Freq: Four times a day (QID) | ORAL | 0 refills | Status: DC | PRN

## 2021-07-22 NOTE — Telephone Encounter (Signed)
Became ill last evening with fatigue and body aches.  This AM with severe nonproductive cough and fever 101.  Home test positive for Covid. ?

## 2021-07-23 ENCOUNTER — Other Ambulatory Visit: Payer: Self-pay

## 2021-07-23 DIAGNOSIS — I10 Essential (primary) hypertension: Secondary | ICD-10-CM

## 2021-07-23 MED ORDER — AMLODIPINE BESYLATE 10 MG PO TABS: 10.0000 mg | ORAL_TABLET | Freq: Every day | ORAL | 0 refills | Status: DC

## 2021-09-17 ENCOUNTER — Other Ambulatory Visit: Payer: Self-pay | Admitting: Internal Medicine

## 2021-09-17 DIAGNOSIS — I10 Essential (primary) hypertension: Secondary | ICD-10-CM

## 2021-09-18 NOTE — Telephone Encounter (Signed)
Requested medication (s) are due for refill today: yes ? ?Requested medication (s) are on the active medication list: yes ? ?Last refill:  07/23/21 #30 ? ?Future visit scheduled: no ? ?Notes to clinic:  Called pt to schedule appt for refills LM on VM. Has already given courtesy RF ? ? ?Requested Prescriptions  ?Pending Prescriptions Disp Refills  ? amLODipine (NORVASC) 10 MG tablet [Pharmacy Med Name: AMLODIPINE BESYLATE 10 MG TAB] 30 tablet 0  ?  Sig: TAKE ONE (1) TABLET BY MOUTH ONCE DAILY  ?  ? Cardiovascular: Calcium Channel Blockers 2 Failed - 09/17/2021  1:40 PM  ?  ?  Failed - Last Heart Rate in normal range  ?  Pulse Readings from Last 1 Encounters:  ?02/12/21 (!) 122  ?  ?  ?  ?  Failed - Valid encounter within last 6 months  ?  Recent Outpatient Visits   ? ?      ? 7 months ago Essential hypertension  ? [facility] Glean Hess, MD  ? 10 months ago COVID-19 virus infection  ? [facility] Glean Hess, MD  ? 1 year ago Sleep-disordered breathing  ? [facility] Glean Hess, MD  ? 1 year ago Essential hypertension  ? [facility] Glean Hess, MD  ? 1 year ago Essential hypertension  ? [facility] Glean Hess, MD  ? ?  ?  ? ? ?  ?  ?  Passed - Last BP in normal range  ?  BP Readings from Last 1 Encounters:  ?02/12/21 136/74  ?  ?  ?  ?  ? ? ? ? ?

## 2021-09-23 ENCOUNTER — Telehealth: Payer: Self-pay

## 2021-09-23 NOTE — Telephone Encounter (Signed)
Called pt left VM to call back. ? ?Called pt to let her know that [facility] sent  a fax to [place] that there has been a recall on Pantoprazole 40 MG due to pills looking dirty or discolored. If  pills are discolored pt needs to call [phone].  ? ?[place] nurse may give results to patient if they return call to clinic, a CRM has been created. ? ?KP ?

## 2021-09-24 NOTE — Telephone Encounter (Signed)
Attempted to call patient regarding Rx information- left message to call office. ?

## 2021-09-25 NOTE — Telephone Encounter (Signed)
Patient called, left VM to return the call to the office. 

## 2021-09-30 ENCOUNTER — Encounter: Payer: Self-pay | Admitting: Hematology and Oncology

## 2021-10-01 ENCOUNTER — Ambulatory Visit (INDEPENDENT_AMBULATORY_CARE_PROVIDER_SITE_OTHER): Payer: 59 | Admitting: Internal Medicine

## 2021-10-01 ENCOUNTER — Encounter: Payer: Self-pay | Admitting: Hematology and Oncology

## 2021-10-01 ENCOUNTER — Encounter: Payer: Self-pay | Admitting: Internal Medicine

## 2021-10-01 VITALS — BP 130/78 | HR 96 | Temp 98.2°F | Ht 72.0 in | Wt 212.0 lb

## 2021-10-01 DIAGNOSIS — R252 Cramp and spasm: Secondary | ICD-10-CM

## 2021-10-01 DIAGNOSIS — I10 Essential (primary) hypertension: Secondary | ICD-10-CM

## 2021-10-01 DIAGNOSIS — E559 Vitamin D deficiency, unspecified: Secondary | ICD-10-CM

## 2021-10-01 DIAGNOSIS — R110 Nausea: Secondary | ICD-10-CM

## 2021-10-01 DIAGNOSIS — R197 Diarrhea, unspecified: Secondary | ICD-10-CM

## 2021-10-01 DIAGNOSIS — M7711 Lateral epicondylitis, right elbow: Secondary | ICD-10-CM | POA: Diagnosis not present

## 2021-10-01 DIAGNOSIS — K219 Gastro-esophageal reflux disease without esophagitis: Secondary | ICD-10-CM

## 2021-10-01 MED ORDER — PROMETHAZINE HCL 25 MG PO TABS: 25.0000 mg | ORAL_TABLET | Freq: Three times a day (TID) | ORAL | 0 refills | Status: DC | PRN

## 2021-10-01 MED ORDER — AMLODIPINE BESYLATE 10 MG PO TABS: 10.0000 mg | ORAL_TABLET | Freq: Every day | ORAL | 1 refills | Status: DC

## 2021-10-01 MED ORDER — OLMESARTAN MEDOXOMIL 40 MG PO TABS: ORAL_TABLET | ORAL | 1 refills | Status: DC

## 2021-10-01 NOTE — Progress Notes (Signed)
? ? ?Date:  10/01/2021  ? ?Name:  Molly Cisneros   DOB:  Oct 22, 1985   MRN:  [id number] ? ? ?Chief Complaint: Abdominal Pain (Pushing fluids, having diarrhea, feeling weak. Started 1 week ago. No appetite. ) and Hypertension ? ?Abdominal Pain ?This is a new problem. The current episode started in the past 7 days. The onset quality is sudden. The problem has been waxing and waning. The quality of the pain is colicky, sharp and cramping. The abdominal pain radiates to the LLQ. Associated symptoms include arthralgias (right elbow), diarrhea and myalgias. Pertinent negatives include no constipation, dysuria, headaches or hematuria.  ?Hypertension ?This is a chronic problem. The problem is controlled. Pertinent negatives include no chest pain, headaches, palpitations or shortness of breath. Past treatments include angiotensin blockers and calcium channel blockers. There is no history of kidney disease, CAD/MI or CVA.  ?Arm Pain  ?Incident onset: More than a month ago. The incident occurred at home. The pain is present in the right elbow. The quality of the pain is described as shooting (tingling, numbness). The pain is moderate. The pain has been Intermittent since the incident. Associated symptoms include muscle weakness, numbness and tingling. Pertinent negatives include no chest pain. The symptoms are aggravated by movement and lifting.  ? ?Lab Results  ?Component Value Date  ? NA 143 02/12/2021  ? K 5.0 02/12/2021  ? CO2 19 (L) 02/12/2021  ? GLUCOSE 106 (H) 02/12/2021  ? BUN 13 02/12/2021  ? CREATININE 1.00 02/12/2021  ? CALCIUM 10.2 02/12/2021  ? EGFR 75 02/12/2021  ? GFRNONAA >60 08/07/2020  ? ?Lab Results  ?Component Value Date  ? CHOL 266 (H) 05/26/2017  ? HDL 40 (L) 05/26/2017  ? LDLCALC 147 (H) 05/26/2017  ? TRIG 394 (H) 05/26/2017  ? CHOLHDL 6.7 05/26/2017  ? ?Lab Results  ?Component Value Date  ? TSH 4.340 02/12/2021  ? ?Lab Results  ?Component Value Date  ? HGBA1C 5.5 02/12/2021  ? ?Lab Results  ?Component Value  Date  ? WBC 9.8 02/12/2021  ? HGB 13.8 02/12/2021  ? HCT 43.4 02/12/2021  ? MCV 88 02/12/2021  ? PLT 543 (H) 02/12/2021  ? ?Lab Results  ?Component Value Date  ? ALT 21 02/12/2021  ? AST 15 02/12/2021  ? GGT 32 06/11/2019  ? ALKPHOS 100 02/12/2021  ? BILITOT 0.4 02/12/2021  ? ?Lab Results  ?Component Value Date  ? VD25OH 8.43 (L) 07/07/2019  ?  ? ?Review of Systems  ?Constitutional:  Negative for fatigue and unexpected weight change.  ?HENT:  Negative for nosebleeds.   ?Eyes:  Negative for visual disturbance.  ?Respiratory:  Negative for cough, chest tightness, shortness of breath and wheezing.   ?Cardiovascular:  Negative for chest pain, palpitations and leg swelling.  ?Gastrointestinal:  Positive for abdominal pain and diarrhea. Negative for blood in stool and constipation.  ?Endocrine: Negative for polydipsia and polyuria.  ?Genitourinary:  Negative for dysuria and hematuria.  ?Musculoskeletal:  Positive for arthralgias (right elbow) and myalgias. Negative for gait problem.  ?Neurological:  Positive for tingling and numbness. Negative for dizziness, weakness, light-headedness and headaches.  ?Psychiatric/Behavioral:  Negative for dysphoric mood and sleep disturbance. The patient is not nervous/anxious.   ? ?Patient Active Problem List  ? Diagnosis Date Noted  ? Menorrhagia with regular cycle 02/12/2021  ? OSA (obstructive sleep apnea) 09/17/2020  ? Rectal fissure 07/18/2020  ? Headache disorder 11/24/2019  ? Blurred vision, left eye   ? History of Clostridium difficile colitis 07/16/2019  ?  Vitamin D deficiency 07/11/2019  ? GERD (gastroesophageal reflux disease) 07/01/2019  ? Depression with anxiety 07/01/2019  ? Folate deficiency 06/30/2019  ? B12 deficiency 06/30/2019  ? Orthostasis 06/21/2019  ? Hypochloremia   ? Hyponatremia 02/13/2019  ? Hypokalemia 02/05/2019  ? Chronic diarrhea of unknown origin   ? Essential hypertension 11/03/2018  ? Hyperlipidemia, mixed 11/03/2018  ? Degeneration of C5-C6  intervertebral disc 08/31/2018  ? Herniation of left side of L4-L5 intervertebral disc 06/05/2017  ? Obesity (BMI 30-39.9) 06/05/2017  ? ? ?Allergies  ?Allergen Reactions  ? Shellfish Allergy Anaphylaxis  ? Aloe Vera Dermatitis  ? Latex   ?  Other reaction(s): Unknown  ? ? ?Past Surgical History:  ?Procedure Laterality Date  ? COLONOSCOPY WITH PROPOFOL N/A 12/23/2018  ? Procedure: COLONOSCOPY WITH PROPOFOL;  Surgeon: Lin Landsman, MD;  Location[facility] ENDOSCOPY;  Service: Gastroenterology;  Laterality: N/A;  ? ESOPHAGOGASTRODUODENOSCOPY (EGD) WITH PROPOFOL N/A 12/23/2018  ? Procedure: ESOPHAGOGASTRODUODENOSCOPY (EGD) WITH PROPOFOL;  Surgeon: Lin Landsman, MD;  Location[facility] ENDOSCOPY;  Service: Gastroenterology;  Laterality: N/A;  ? GIVENS CAPSULE STUDY N/A 06/14/2019  ? Procedure: GIVENS CAPSULE STUDY;  Surgeon: Lin Landsman, MD;  Location: [facility] ENDOSCOPY;  Service: Gastroenterology;  Laterality: N/A;  ? GIVENS CAPSULE STUDY N/A 06/16/2019  ? Procedure: GIVENS CAPSULE STUDY;  Surgeon: Lin Landsman, MD;  Location: [facility] ENDOSCOPY;  Service: Gastroenterology;  Laterality: N/A;  ? LAPAROSCOPIC APPENDECTOMY N/A 11/06/2018  ? Procedure: APPENDECTOMY LAPAROSCOPIC;  Surgeon: Herbert Pun, MD;  Location: [facility] ORS;  Service: General;  Laterality: N/A;  ? ? ?Social History  ? ?Tobacco Use  ? Smoking status: Never  ? Smokeless tobacco: Never  ?Vaping Use  ? Vaping Use: Never used  ?Substance Use Topics  ? Alcohol use: Yes  ?  Alcohol/week: 2.0 standard drinks  ?  Types: 2 Cans of beer per week  ? Drug use: No  ? ? ? ?Medication list has been reviewed and updated. ? ?Current Meds  ?Medication Sig  ? albuterol (VENTOLIN HFA) 108 (90 Base) MCG/ACT inhaler Inhale 2 puffs into the lungs every 6 (six) hours as needed for wheezing or shortness of breath.  ? ALPRAZolam (XANAX) 1 MG tablet Take 1 mg by mouth 3 (three) times daily as needed for anxiety.   ? amLODipine (NORVASC) 10 MG tablet Take 1 tablet  (10 mg total) by mouth daily.  ? amphetamine-dextroamphetamine (ADDERALL XR) 20 MG 24 hr capsule Take 20 mg by mouth every morning.  ? amphetamine-dextroamphetamine (ADDERALL) 20 MG tablet Take 20 mg by mouth 2 (two) times daily.  ? atorvastatin (LIPITOR) 20 MG tablet TAKE (1) TABLET BY MOUTH EVERY DAY  ? butalbital-acetaminophen-caffeine (FIORICET) 50-325-40 MG tablet Take 1 tablet by mouth every 6 (six) hours as needed for headache or migraine.  ? cyclobenzaprine (FLEXERIL) 5 MG tablet TAKE (1) TABLET BY MOUTH THREE TIMES A DAY (Patient taking differently: as needed.)  ? DULoxetine (CYMBALTA) 60 MG capsule Take 60 mg by mouth daily.   ? EPINEPHrine 0.3 mg/0.3 mL IJ SOAJ injection Inject into the muscle.  ? etonogestrel-ethinyl estradiol (NUVARING) 0.12-0.015 MG/24HR vaginal ring USE AS DIRECTED  ? gabapentin (NEURONTIN) 300 MG capsule Take 1 capsule (300 mg total) by mouth 2 (two) times daily. (Patient taking differently: Take 300 mg by mouth as needed.)  ? hydrOXYzine (VISTARIL) 25 MG capsule Take 1 capsule (25 mg total) by mouth as needed. TAKE 1 TO 2 CAPSULES BY MOUTH EVERY 6 HOURS  ? lidocaine-prilocaine (EMLA) cream  Apply 1 application topically as needed.  ? loperamide (IMODIUM) 2 MG capsule Take 1 capsule (2 mg total) by mouth as needed for diarrhea or loose stools.  ? Octreotide Acetate 200 MCG/ML SOLN Inject 0.5 mLs (100 mcg total) as directed every 8 (eight) hours as needed (diarrhea).  ? olmesartan (BENICAR) 40 MG tablet TAKE (1) TABLET BY MOUTH EVERY DAY  ? pantoprazole (PROTONIX) 40 MG tablet TAKE (1) TABLET BY MOUTH EVERY DAY  ? promethazine (PHENERGAN) 25 MG tablet Take 1 tablet (25 mg total) by mouth every 8 (eight) hours as needed for nausea or vomiting.  ? valACYclovir (VALTREX) 1000 MG tablet Take 1 tablet (1,000 mg total) by mouth 2 (two) times daily as needed.  ? ? ? ?  02/12/2021  ?  1:41 PM 11/13/2020  ?  4:19 PM 08/07/2020  ? 10:53 AM 12/27/2019  ?  1:37 PM  ?GAD 7 : Generalized Anxiety Score   ?Nervous, Anxious, on Edge 3 0 0 0  ?Control/stop worrying 3 0 0 0  ?Worry too much - different things 0 0 0 0  ?Trouble relaxing 3 0 0 0  ?Restless 0 0 0 0  ?Easily annoyed or irritable 3 0 0 0  ?Afraid - awful mig

## 2021-10-02 LAB — CBC WITH DIFFERENTIAL/PLATELET
Basophils Absolute: 0.1 10*3/uL (ref 0.0–0.2)
Basos: 1 %
EOS (ABSOLUTE): 0.1 10*3/uL (ref 0.0–0.4)
Eos: 1 %
Hematocrit: 36.1 % (ref 34.0–46.6)
Hemoglobin: 12.2 g/dL (ref 11.1–15.9)
Immature Grans (Abs): 0 10*3/uL (ref 0.0–0.1)
Immature Granulocytes: 0 %
Lymphocytes Absolute: 2 10*3/uL (ref 0.7–3.1)
Lymphs: 27 %
MCH: 29.8 pg (ref 26.6–33.0)
MCHC: 33.8 g/dL (ref 31.5–35.7)
MCV: 88 fL (ref 79–97)
Monocytes Absolute: 0.5 10*3/uL (ref 0.1–0.9)
Monocytes: 6 %
Neutrophils Absolute: 5 10*3/uL (ref 1.4–7.0)
Neutrophils: 65 %
Platelets: 420 10*3/uL (ref 150–450)
RBC: 4.09 x10E6/uL (ref 3.77–5.28)
RDW: 13 % (ref 11.7–15.4)
WBC: 7.7 10*3/uL (ref 3.4–10.8)

## 2021-10-02 LAB — COMPREHENSIVE METABOLIC PANEL
ALT: 29 IU/L (ref 0–32)
AST: 19 IU/L (ref 0–40)
Albumin/Globulin Ratio: 1.7 (ref 1.2–2.2)
Albumin: 4.2 g/dL (ref 3.8–4.8)
Alkaline Phosphatase: 78 IU/L (ref 44–121)
BUN/Creatinine Ratio: 11 (ref 9–23)
BUN: 9 mg/dL (ref 6–20)
Bilirubin Total: 0.3 mg/dL (ref 0.0–1.2)
CO2: 19 mmol/L — ABNORMAL LOW (ref 20–29)
Calcium: 9.3 mg/dL (ref 8.7–10.2)
Chloride: 103 mmol/L (ref 96–106)
Creatinine, Ser: 0.8 mg/dL (ref 0.57–1.00)
Globulin, Total: 2.5 g/dL (ref 1.5–4.5)
Glucose: 119 mg/dL — ABNORMAL HIGH (ref 70–99)
Potassium: 4.6 mmol/L (ref 3.5–5.2)
Sodium: 137 mmol/L (ref 134–144)
Total Protein: 6.7 g/dL (ref 6.0–8.5)
eGFR: 97 mL/min/{1.73_m2} (ref >59)

## 2021-10-02 LAB — VITAMIN D 25 HYDROXY (VIT D DEFICIENCY, FRACTURES): Vit D, 25-Hydroxy: 18.1 ng/mL — ABNORMAL LOW (ref 30.0–100.0)

## 2021-10-02 LAB — MAGNESIUM: Magnesium: 1.9 mg/dL (ref 1.6–2.3)

## 2021-10-08 ENCOUNTER — Telehealth: Payer: Self-pay

## 2021-10-08 ENCOUNTER — Other Ambulatory Visit: Payer: Self-pay

## 2021-10-08 MED ORDER — ETONOGESTREL-ETHINYL ESTRADIOL 0.12-0.015 MG/24HR VA RING: VAGINAL_RING | VAGINAL | 0 refills | Status: DC

## 2021-10-08 NOTE — Telephone Encounter (Signed)
Patient called saying that she accidentally flushed her nuvaring down the toilet. Insurance will not pay for another one until next month so she asked that I send a nuvaring to [facility] in [place] and she can get it there for Cash for $50. Sent 1 ring to pharmacy and patient informed.

## 2021-11-04 ENCOUNTER — Other Ambulatory Visit: Payer: Self-pay

## 2021-11-04 MED ORDER — VALACYCLOVIR HCL 1 G PO TABS: 1000.0000 mg | ORAL_TABLET | Freq: Two times a day (BID) | ORAL | 5 refills | Status: DC | PRN

## 2021-11-15 ENCOUNTER — Other Ambulatory Visit: Payer: Self-pay

## 2021-11-15 ENCOUNTER — Other Ambulatory Visit: Payer: Self-pay | Admitting: Internal Medicine

## 2021-11-15 MED ORDER — CEPHALEXIN 500 MG PO CAPS: 500.0000 mg | ORAL_CAPSULE | Freq: Three times a day (TID) | ORAL | 0 refills | Status: AC

## 2021-11-15 NOTE — Telephone Encounter (Signed)
Requested medication (s) are due for refill today: yes  Requested medication (s) are on the active medication list: yes  Last refill:  04/23/21 #90 with 1 RF  Future visit scheduled: no, seen 10/01/21  Notes to clinic:  Failed protocol of labs (lipids) within one year, no upcoming appt, seen recently, please assess.       Requested Prescriptions  Pending Prescriptions Disp Refills   atorvastatin (LIPITOR) 20 MG tablet [Pharmacy Med Name: ATORVASTATIN CALCIUM 20 MG TAB] 90 tablet 1    Sig: TAKE (1) TABLET BY MOUTH EVERY DAY     Cardiovascular:  Antilipid - Statins Failed - 11/15/2021 12:14 PM      Failed - Lipid Panel in normal range within the last 12 months    Cholesterol  Date Value Ref Range Status  05/26/2017 266 (H) 0 - 200 mg/dL Final   LDL Cholesterol  Date Value Ref Range Status  05/26/2017 147 (H) 0 - 99 mg/dL Final    Comment:           Total Cholesterol/HDL:CHD Risk Coronary Heart Disease Risk Table                     Men   Women  1/2 Average Risk   3.4   3.3  Average Risk       5.0   4.4  2 X Average Risk   9.6   7.1  3 X Average Risk  23.4   11.0        Use the calculated Patient Ratio above and the CHD Risk Table to determine the patient's CHD Risk.        ATP III CLASSIFICATION (LDL):  <100     mg/dL   Optimal  100-129  mg/dL   Near or Above                    Optimal  130-159  mg/dL   Borderline  160-189  mg/dL   High  >190     mg/dL   Very High Performed at [facility], [place], [place] [address]    HDL  Date Value Ref Range Status  05/26/2017 40 (L) >40 mg/dL Final   Triglycerides  Date Value Ref Range Status  05/26/2017 394 (H) <150 mg/dL Final         Passed - Patient is not pregnant      Passed - Valid encounter within last 12 months    Recent Outpatient Visits           1 month ago Essential hypertension   Ali Chukson, MD   9 months ago Essential hypertension   [facility] Glean Hess, MD   1 year ago COVID-19 virus infection   [facility] Glean Hess, MD   1 year ago Sleep-disordered breathing   [facility] Glean Hess, MD   1 year ago Essential hypertension   [facility] Glean Hess, MD

## 2022-01-16 ENCOUNTER — Other Ambulatory Visit: Payer: Self-pay | Admitting: Internal Medicine

## 2022-01-17 NOTE — Telephone Encounter (Signed)
Requested Prescriptions  Pending Prescriptions Disp Refills  . hydrOXYzine (VISTARIL) 25 MG capsule [Pharmacy Med Name: HYDROXYZINE PAMOATE 25 MG CAP] 60 capsule 0    Sig: TAKE 1 TO 2 CAPSULES BY MOUTH EVERY 6 HOURS     Ear, Nose, and Throat:  Antihistamines 2 Passed - 01/16/2022  4:25 PM      Passed - Cr in normal range and within 360 days    Creatinine, Ser  Date Value Ref Range Status  10/01/2021 0.80 0.57 - 1.00 mg/dL Final   Creatinine, Random U  Date Value Ref Range Status  05/18/2019 155.0 Not Estab. mg/dL Final   Creatinine, Urine  Date Value Ref Range Status  07/01/2019 27 mg/dL Final    Comment:    Performed at [facility], [address], [place], [place] [address]         Passed - Valid encounter within last 12 months    Recent Outpatient Visits          3 months ago Essential hypertension   [place] [facility], Jesse Sans, MD   11 months ago Essential hypertension   [place] [facility] at [facility], Jesse Sans, MD   1 year ago COVID-19 virus infection   [place] [facility] at [facility], Jesse Sans, MD   1 year ago Sleep-disordered breathing   [place] [facility] at [facility], Jesse Sans, MD   2 years ago Essential hypertension   [place] [facility] at [facility], Jesse Sans, MD

## 2022-01-17 NOTE — Telephone Encounter (Signed)
Requested medication (s) are due for refill today: no  Requested medication (s) are on the active medication list: yes  Last refill:  01/17/22  Future visit scheduled: yes  Notes to clinic:  Unable to refill per protocol, last refill by provider 3/0/09, possible duplicate.     Requested Prescriptions  Pending Prescriptions Disp Refills   hydrOXYzine (VISTARIL) 25 MG capsule [Pharmacy Med Name: HYDROXYZINE PAMOATE 25 MG CAP] 60 capsule 2    Sig: TAKE 1-2 CAPSULES BY MOUTH EVERY 6 HOURSAS NEEDED.     Ear, Nose, and Throat:  Antihistamines 2 Passed - 01/16/2022  4:40 PM      Passed - Cr in normal range and within 360 days    Creatinine, Ser  Date Value Ref Range Status  10/01/2021 0.80 0.57 - 1.00 mg/dL Final   Creatinine, Random U  Date Value Ref Range Status  05/18/2019 155.0 Not Estab. mg/dL Final   Creatinine, Urine  Date Value Ref Range Status  07/01/2019 27 mg/dL Final    Comment:    Performed at [facility], [address] [place], [place] [address]         Passed - Valid encounter within last 12 months    Recent Outpatient Visits           3 months ago Essential hypertension   [place] at [facility], Jesse Sans, MD   11 months ago Essential hypertension   [place] [facility] at [facility], Jesse Sans, MD   1 year ago COVID-19 virus infection   [place] [facility] at [facility], Jesse Sans, MD   1 year ago Sleep-disordered breathing   [place] [facility] at [facility], Jesse Sans, MD   2 years ago Essential hypertension   [place] and [facility] at [facility], Jesse Sans, MD

## 2022-02-18 ENCOUNTER — Other Ambulatory Visit: Payer: Self-pay | Admitting: Internal Medicine

## 2022-02-18 DIAGNOSIS — K219 Gastro-esophageal reflux disease without esophagitis: Secondary | ICD-10-CM

## 2022-02-18 NOTE — Telephone Encounter (Signed)
Requested Prescriptions  Pending Prescriptions Disp Refills  . pantoprazole (PROTONIX) 40 MG tablet [Pharmacy Med Name: PANTOPRAZOLE SODIUM 40 MG DR TAB] 90 tablet 2    Sig: TAKE (1) TABLET BY MOUTH EVERY DAY     Gastroenterology: Proton Pump Inhibitors Passed - 02/18/2022 11:39 AM      Passed - Valid encounter within last 12 months    Recent Outpatient Visits          4 months ago Essential hypertension   [place] [facility] at [facility], Jesse Sans, MD   1 year ago Essential hypertension   [place] [facility] at [facility], Jesse Sans, MD   1 year ago COVID-19 virus infection   [place] [facility] at [facility], Jesse Sans, MD   1 year ago Sleep-disordered breathing   [place] [facility] at [facility], Jesse Sans, MD   2 years ago Essential hypertension   [place] [facility] at [facility], Jesse Sans, MD

## 2022-03-17 ENCOUNTER — Encounter: Payer: Self-pay | Admitting: Hematology and Oncology

## 2022-03-18 ENCOUNTER — Encounter: Payer: Self-pay | Admitting: Hematology and Oncology

## 2022-04-02 ENCOUNTER — Other Ambulatory Visit: Payer: Self-pay

## 2022-04-02 DIAGNOSIS — K219 Gastro-esophageal reflux disease without esophagitis: Secondary | ICD-10-CM

## 2022-04-02 MED ORDER — PANTOPRAZOLE SODIUM 40 MG PO TBEC: DELAYED_RELEASE_TABLET | ORAL | 5 refills | Status: DC

## 2022-04-07 ENCOUNTER — Other Ambulatory Visit: Payer: Self-pay

## 2022-04-07 DIAGNOSIS — R1319 Other dysphagia: Secondary | ICD-10-CM

## 2022-06-27 ENCOUNTER — Other Ambulatory Visit: Payer: Self-pay | Admitting: Internal Medicine

## 2022-06-27 DIAGNOSIS — I10 Essential (primary) hypertension: Secondary | ICD-10-CM

## 2022-06-30 ENCOUNTER — Other Ambulatory Visit: Payer: Self-pay | Admitting: Internal Medicine

## 2022-07-01 NOTE — Telephone Encounter (Signed)
Left voice mail to set up medication refill and fasting  labs

## 2022-08-26 ENCOUNTER — Other Ambulatory Visit: Payer: Self-pay | Admitting: Internal Medicine

## 2022-08-26 MED ORDER — MUPIROCIN 2 % EX OINT: 1.0000 | TOPICAL_OINTMENT | Freq: Two times a day (BID) | CUTANEOUS | 0 refills | Status: DC

## 2022-09-23 ENCOUNTER — Other Ambulatory Visit: Payer: Self-pay | Admitting: Internal Medicine

## 2022-10-02 ENCOUNTER — Encounter: Payer: Self-pay | Admitting: Hematology and Oncology

## 2022-10-03 ENCOUNTER — Other Ambulatory Visit: Payer: Self-pay | Admitting: Internal Medicine

## 2022-10-03 ENCOUNTER — Ambulatory Visit (INDEPENDENT_AMBULATORY_CARE_PROVIDER_SITE_OTHER): Payer: 59 | Admitting: Internal Medicine

## 2022-10-03 ENCOUNTER — Encounter: Payer: Self-pay | Admitting: Internal Medicine

## 2022-10-03 VITALS — BP 134/82 | HR 109 | Ht 72.0 in | Wt 228.0 lb

## 2022-10-03 DIAGNOSIS — K219 Gastro-esophageal reflux disease without esophagitis: Secondary | ICD-10-CM

## 2022-10-03 DIAGNOSIS — E782 Mixed hyperlipidemia: Secondary | ICD-10-CM

## 2022-10-03 DIAGNOSIS — M25561 Pain in right knee: Secondary | ICD-10-CM

## 2022-10-03 DIAGNOSIS — G4733 Obstructive sleep apnea (adult) (pediatric): Secondary | ICD-10-CM | POA: Diagnosis not present

## 2022-10-03 DIAGNOSIS — I10 Essential (primary) hypertension: Secondary | ICD-10-CM | POA: Diagnosis not present

## 2022-10-03 MED ORDER — AMLODIPINE BESYLATE 10 MG PO TABS: ORAL_TABLET | ORAL | 1 refills | Status: DC

## 2022-10-03 MED ORDER — OLMESARTAN MEDOXOMIL 40 MG PO TABS
40.0000 mg | ORAL_TABLET | Freq: Every day | ORAL | 1 refills | Status: AC
Start: 2022-10-03 — End: ?

## 2022-10-03 NOTE — Assessment & Plan Note (Signed)
Tolerating statin medications.  No side effects noted. LDL was high last check with no recent panel.

## 2022-10-03 NOTE — Assessment & Plan Note (Signed)
Not currently being treated. Sleep study -

## 2022-10-03 NOTE — Assessment & Plan Note (Addendum)
Stable exam with moderately well controlled BP.  Currently taking olmesartan and amlodipine but out for the past three days. Tolerating medications without concerns or side effects. Will continue to recommend low sodium diet and resume current regimen.

## 2022-10-03 NOTE — Progress Notes (Signed)
Date:  10/03/2022   Name:  Molly Cisneros   DOB:  12-12-1985   MRN:  [id number]   Chief Complaint: Knee Pain (Rt knee. Slipped and fell on Tuesday on right knee. Painful to walk on it. Patients gait is off and she is limping.), Hypertension, Hyperlipidemia, and Sleep Apnea  Knee Pain  Incident onset: three days ago. The incident occurred in the street. The injury mechanism was a twisting injury. The pain is present in the right knee. The quality of the pain is described as aching and shooting. The pain is mild. The pain has been Fluctuating since onset. Associated symptoms include a loss of motion. Pertinent negatives include no inability to bear weight. Associated symptoms comments: And feels unstable. The symptoms are aggravated by palpation and weight bearing. She has tried NSAIDs for the symptoms. The treatment provided mild relief.  Hypertension This is a chronic problem. The problem is controlled (has not been taking meds for the last fews days waiting for refills). Pertinent negatives include no chest pain, headaches or shortness of breath. Past treatments include angiotensin blockers and calcium channel blockers. The current treatment provides significant improvement. There is no history of kidney disease, CAD/MI or CVA.  Hyperlipidemia This is a chronic problem. The problem is uncontrolled. Pertinent negatives include no chest pain or shortness of breath. Current antihyperlipidemic treatment includes statins.  OSA - CPAP ordered 2022. She never was able to tolerated the mask - she felt closed in and anxious and would remove it after a few hours of sleep.  She still has the equipment but is very interested in alternative treatment which has not been investigated.  Lab Results  Component Value Date   NA 137 10/01/2021   K 4.6 10/01/2021   CO2 19 (L) 10/01/2021   GLUCOSE 119 (H) 10/01/2021   BUN 9 10/01/2021   CREATININE 0.80 10/01/2021   CALCIUM 9.3 10/01/2021   EGFR 97 10/01/2021    GFRNONAA >60 08/07/2020   Lab Results  Component Value Date   CHOL 266 (H) 05/26/2017   HDL 40 (L) 05/26/2017   LDLCALC 147 (H) 05/26/2017   TRIG 394 (H) 05/26/2017   CHOLHDL 6.7 05/26/2017   Lab Results  Component Value Date   TSH 4.340 02/12/2021   Lab Results  Component Value Date   HGBA1C 5.5 02/12/2021   Lab Results  Component Value Date   WBC 7.7 10/01/2021   HGB 12.2 10/01/2021   HCT 36.1 10/01/2021   MCV 88 10/01/2021   PLT 420 10/01/2021   Lab Results  Component Value Date   ALT 29 10/01/2021   AST 19 10/01/2021   GGT 32 06/11/2019   ALKPHOS 78 10/01/2021   BILITOT 0.3 10/01/2021   Lab Results  Component Value Date   VD25OH 18.1 (L) 10/01/2021     Review of Systems  Constitutional:  Negative for chills, fatigue and fever.  HENT:  Negative for trouble swallowing.   Respiratory:  Negative for chest tightness and shortness of breath.   Cardiovascular:  Negative for chest pain and leg swelling.  Musculoskeletal:  Positive for arthralgias and gait problem. Negative for joint swelling.  Neurological:  Negative for dizziness and headaches.  Psychiatric/Behavioral:  Positive for sleep disturbance. Negative for dysphoric mood. The patient is not nervous/anxious.     Patient Active Problem List   Diagnosis Date Noted   Menorrhagia with regular cycle 02/12/2021   OSA (obstructive sleep apnea) 09/17/2020   Rectal fissure 07/18/2020  Headache disorder 11/24/2019   Vitamin D deficiency 07/11/2019   GERD (gastroesophageal reflux disease) 07/01/2019   Depression with anxiety 07/01/2019   Folate deficiency 06/30/2019   B12 deficiency 06/30/2019   Orthostasis 06/21/2019   Chronic diarrhea of unknown origin    Essential hypertension 11/03/2018   Hyperlipidemia, mixed 11/03/2018   Degeneration of C5-C6 intervertebral disc 08/31/2018   Herniation of left side of L4-L5 intervertebral disc 06/05/2017   Obesity (BMI 30-39.9) 06/05/2017    Allergies  Allergen  Reactions   Shellfish Allergy Anaphylaxis   Aloe Vera Dermatitis   Latex     Other reaction(s): Unknown    Past Surgical History:  Procedure Laterality Date   COLONOSCOPY WITH PROPOFOL N/A 12/23/2018   Procedure: COLONOSCOPY WITH PROPOFOL;  Surgeon: Toney Reil, MD;  Location: [facility] ENDOSCOPY;  Service: Gastroenterology;  Laterality: N/A;   ESOPHAGOGASTRODUODENOSCOPY (EGD) WITH PROPOFOL N/A 12/23/2018   Procedure: ESOPHAGOGASTRODUODENOSCOPY (EGD) WITH PROPOFOL;  Surgeon: Toney Reil, MD;  Location: [facility] ENDOSCOPY;  Service: Gastroenterology;  Laterality: N/A;   GIVENS CAPSULE STUDY N/A 06/14/2019   Procedure: GIVENS CAPSULE STUDY;  Surgeon: Toney Reil, MD;  Location: [facility] ENDOSCOPY;  Service: Gastroenterology;  Laterality: N/A;   GIVENS CAPSULE STUDY N/A 06/16/2019   Procedure: GIVENS CAPSULE STUDY;  Surgeon: Toney Reil, MD;  Location: [facility] ENDOSCOPY;  Service: Gastroenterology;  Laterality: N/A;   LAPAROSCOPIC APPENDECTOMY N/A 11/06/2018   Procedure: APPENDECTOMY LAPAROSCOPIC;  Surgeon: Carolan Shiver, MD;  Location: [facility] ORS;  Service: General;  Laterality: N/A;    Social History   Tobacco Use   Smoking status: Never   Smokeless tobacco: Never  Vaping Use   Vaping Use: Never used  Substance Use Topics   Alcohol use: Yes    Alcohol/week: 2.0 standard drinks of alcohol    Types: 2 Cans of beer per week   Drug use: No     Medication list has been reviewed and updated.  Current Meds  Medication Sig   albuterol (VENTOLIN HFA) 108 (90 Base) MCG/ACT inhaler Inhale 2 puffs into the lungs every 6 (six) hours as needed for wheezing or shortness of breath.   ALPRAZolam (XANAX) 1 MG tablet Take 1 mg by mouth 3 (three) times daily as needed for anxiety.    amphetamine-dextroamphetamine (ADDERALL) 20 MG tablet Take 20 mg by mouth 2 (two) times daily.   atorvastatin (LIPITOR) 20 MG tablet TAKE (1) TABLET BY MOUTH EVERY DAY   cyclobenzaprine (FLEXERIL) 5  MG tablet TAKE (1) TABLET BY MOUTH THREE TIMES A DAY (Patient taking differently: as needed.)   DULoxetine (CYMBALTA) 60 MG capsule Take 60 mg by mouth daily.    EPINEPHrine 0.3 mg/0.3 mL IJ SOAJ injection Inject into the muscle.   etonogestrel-ethinyl estradiol (NUVARING) 0.12-0.015 MG/24HR vaginal ring Insert vaginally and leave in place for 3 consecutive weeks, then remove for 1 week.   gabapentin (NEURONTIN) 300 MG capsule Take 1 capsule (300 mg total) by mouth 2 (two) times daily. (Patient taking differently: Take 300 mg by mouth as needed.)   hydrOXYzine (VISTARIL) 25 MG capsule TAKE 1 TO 2 CAPSULES BY MOUTH EVERY 6 HOURS   lidocaine-prilocaine (EMLA) cream Apply 1 application topically as needed.   mupirocin ointment (BACTROBAN) 2 % Apply 1 Application topically 2 (two) times daily.   pantoprazole (PROTONIX) 40 MG tablet TAKE (1) TABLET BY MOUTH EVERY DAY   promethazine (PHENERGAN) 25 MG tablet Take 1 tablet (25 mg total) by mouth every 8 (eight) hours as needed for nausea  or vomiting.   valACYclovir (VALTREX) 1000 MG tablet Take 1 tablet (1,000 mg total) by mouth 2 (two) times daily as needed.   [DISCONTINUED] amLODipine (NORVASC) 10 MG tablet TAKE ONE (1) TABLET BY MOUTH ONCE DAILY   [DISCONTINUED] amphetamine-dextroamphetamine (ADDERALL XR) 20 MG 24 hr capsule Take 20 mg by mouth every morning.   [DISCONTINUED] loperamide (IMODIUM) 2 MG capsule Take 1 capsule (2 mg total) by mouth as needed for diarrhea or loose stools.   [DISCONTINUED] olmesartan (BENICAR) 40 MG tablet TAKE (1) TABLET BY MOUTH EVERY DAY       10/03/2022   10:32 AM 02/12/2021    1:41 PM 11/13/2020    4:19 PM 08/07/2020   10:53 AM  GAD 7 : Generalized Anxiety Score  Nervous, Anxious, on Edge 1 3 0 0  Control/stop worrying 1 3 0 0  Worry too much - different things 1 0 0 0  Trouble relaxing 0 3 0 0  Restless 0 0 0 0  Easily annoyed or irritable 0 3 0 0  Afraid - awful might happen 0 0 0 0  Total GAD 7 Score 3 12 0  0  Anxiety Difficulty Somewhat difficult Very difficult         10/03/2022   10:32 AM 02/12/2021    1:40 PM 11/13/2020    4:19 PM  Depression screen PHQ 2/9  Decreased Interest 0 0 0  Down, Depressed, Hopeless 3 0 0  PHQ - 2 Score 3 0 0  Altered sleeping 3 3 3   Tired, decreased energy 0 3 2  Change in appetite 0 0 2  Feeling bad or failure about yourself  0 0 0  Trouble concentrating 0 0 0  Moving slowly or fidgety/restless 0 0 0  Suicidal thoughts 0 0 0  PHQ-9 Score 6 6 7   Difficult doing work/chores Not difficult at all Very difficult Not difficult at all    BP Readings from Last 3 Encounters:  10/03/22 134/82  10/01/21 130/78  02/12/21 136/74    Physical Exam Vitals and nursing note reviewed.  Constitutional:      General: She is not in acute distress.    Appearance: Normal appearance. She is well-developed.  HENT:     Head: Normocephalic and atraumatic.  Cardiovascular:     Rate and Rhythm: Normal rate and regular rhythm.     Heart sounds: No murmur heard. Pulmonary:     Effort: Pulmonary effort is normal. No respiratory distress.     Breath sounds: No wheezing or rhonchi.  Musculoskeletal:     Cervical back: Normal range of motion.     Right knee: No swelling, deformity or crepitus. Decreased range of motion. Tenderness present over the MCL. No MCL laxity.     Instability Tests: Anterior drawer test negative.  Lymphadenopathy:     Cervical: No cervical adenopathy.  Skin:    General: Skin is warm and dry.     Findings: No rash.  Neurological:     Mental Status: She is alert and oriented to person, place, and time.  Psychiatric:        Mood and Affect: Mood normal.        Behavior: Behavior normal.     Wt Readings from Last 3 Encounters:  10/03/22 228 lb (103.4 kg)  10/01/21 212 lb (96.2 kg)  02/12/21 233 lb (105.7 kg)    BP 134/82 (BP Location: Right Arm, Cuff Size: Large)   Pulse (!) 109   Ht 6' (1.829  m)   Wt 228 lb (103.4 kg)   SpO2 99%   BMI  30.92 kg/m   Assessment and Plan:  Problem List Items Addressed This Visit     Essential hypertension (Chronic)    Stable exam with moderately well controlled BP.  Currently taking olmesartan and amlodipine but out for the past three days. Tolerating medications without concerns or side effects. Will continue to recommend low sodium diet and resume current regimen.       Relevant Medications   olmesartan (BENICAR) 40 MG tablet   amLODipine (NORVASC) 10 MG tablet   Other Relevant Orders   CBC with Differential/Platelet   Comprehensive metabolic panel   TSH   Hyperlipidemia, mixed (Chronic)    Tolerating statin medications.  No side effects noted. LDL was high last check with no recent panel.       Relevant Medications   olmesartan (BENICAR) 40 MG tablet   amLODipine (NORVASC) 10 MG tablet   Other Relevant Orders   Comprehensive metabolic panel   Lipid panel   OSA (obstructive sleep apnea) (Chronic)    Not currently being treated. Sleep study -        Relevant Orders   Ambulatory referral to ENT   Other Visit Diagnoses     Acute pain of right knee    -  Primary   continue Ibuprofen 800 mg tid pt provided with a pull-on hinged knee brace to wear during the day follow up if no improvement in 1-2 weeks       No follow-ups on file.   Partially dictated using Dragon software, any errors are not intentional.  Reubin Milan, MD [facility] [place], [place]

## 2022-10-09 ENCOUNTER — Other Ambulatory Visit: Payer: Self-pay

## 2022-10-09 ENCOUNTER — Other Ambulatory Visit: Payer: Self-pay | Admitting: Internal Medicine

## 2022-10-09 DIAGNOSIS — E782 Mixed hyperlipidemia: Secondary | ICD-10-CM | POA: Diagnosis not present

## 2022-10-09 DIAGNOSIS — N912 Amenorrhea, unspecified: Secondary | ICD-10-CM | POA: Diagnosis not present

## 2022-10-09 DIAGNOSIS — R110 Nausea: Secondary | ICD-10-CM

## 2022-10-09 DIAGNOSIS — I10 Essential (primary) hypertension: Secondary | ICD-10-CM | POA: Diagnosis not present

## 2022-10-09 MED ORDER — PROMETHAZINE HCL 25 MG PO TABS
25.0000 mg | ORAL_TABLET | Freq: Three times a day (TID) | ORAL | 0 refills | Status: DC | PRN
Start: 2022-10-09 — End: 2023-07-10

## 2022-10-10 LAB — CBC WITH DIFFERENTIAL/PLATELET
Basophils Absolute: 0.1 10*3/uL (ref 0.0–0.2)
Basos: 0 %
EOS (ABSOLUTE): 0.3 10*3/uL (ref 0.0–0.4)
Eos: 2 %
Hematocrit: 42.3 % (ref 34.0–46.6)
Hemoglobin: 14 g/dL (ref 11.1–15.9)
Immature Grans (Abs): 0 10*3/uL (ref 0.0–0.1)
Immature Granulocytes: 0 %
Lymphocytes Absolute: 2.6 10*3/uL (ref 0.7–3.1)
Lymphs: 23 %
MCH: 31 pg (ref 26.6–33.0)
MCHC: 33.1 g/dL (ref 31.5–35.7)
MCV: 94 fL (ref 79–97)
Monocytes Absolute: 0.8 10*3/uL (ref 0.1–0.9)
Monocytes: 7 %
Neutrophils Absolute: 7.5 10*3/uL — ABNORMAL HIGH (ref 1.4–7.0)
Neutrophils: 68 %
Platelets: 517 10*3/uL — ABNORMAL HIGH (ref 150–450)
RBC: 4.51 x10E6/uL (ref 3.77–5.28)
RDW: 13.4 % (ref 11.7–15.4)
WBC: 11.3 10*3/uL — ABNORMAL HIGH (ref 3.4–10.8)

## 2022-10-10 LAB — COMPREHENSIVE METABOLIC PANEL
ALT: 23 IU/L (ref 0–32)
AST: 18 IU/L (ref 0–40)
Albumin/Globulin Ratio: 1.8 (ref 1.2–2.2)
Albumin: 4.6 g/dL (ref 3.9–4.9)
Alkaline Phosphatase: 79 IU/L (ref 44–121)
BUN/Creatinine Ratio: 9 (ref 9–23)
BUN: 7 mg/dL (ref 6–20)
Bilirubin Total: 0.5 mg/dL (ref 0.0–1.2)
CO2: 22 mmol/L (ref 20–29)
Calcium: 9.4 mg/dL (ref 8.7–10.2)
Chloride: 101 mmol/L (ref 96–106)
Creatinine, Ser: 0.75 mg/dL (ref 0.57–1.00)
Globulin, Total: 2.5 g/dL (ref 1.5–4.5)
Glucose: 82 mg/dL (ref 70–99)
Potassium: 4.3 mmol/L (ref 3.5–5.2)
Sodium: 137 mmol/L (ref 134–144)
Total Protein: 7.1 g/dL (ref 6.0–8.5)
eGFR: 104 mL/min/{1.73_m2} (ref >59)

## 2022-10-10 LAB — LIPID PANEL
Chol/HDL Ratio: 4.4 ratio (ref 0.0–4.4)
Cholesterol, Total: 170 mg/dL (ref 100–199)
HDL: 39 mg/dL — ABNORMAL LOW (ref >39)
LDL Chol Calc (NIH): 108 mg/dL — ABNORMAL HIGH (ref 0–99)
Triglycerides: 129 mg/dL (ref 0–149)
VLDL Cholesterol Cal: 23 mg/dL (ref 5–40)

## 2022-10-10 LAB — TSH: TSH: 3.6 u[IU]/mL (ref 0.450–4.500)

## 2022-10-10 LAB — BETA HCG QUANT (REF LAB): hCG Quant: 4341 m[IU]/mL

## 2022-10-27 ENCOUNTER — Other Ambulatory Visit: Payer: Self-pay

## 2022-10-27 DIAGNOSIS — Z3201 Encounter for pregnancy test, result positive: Secondary | ICD-10-CM

## 2022-10-28 LAB — BETA HCG QUANT (REF LAB): hCG Quant: 987 m[IU]/mL

## 2022-12-16 ENCOUNTER — Other Ambulatory Visit: Payer: Self-pay | Admitting: Internal Medicine

## 2023-01-03 ENCOUNTER — Other Ambulatory Visit: Payer: Self-pay | Admitting: Internal Medicine

## 2023-01-20 ENCOUNTER — Other Ambulatory Visit: Payer: Self-pay

## 2023-01-20 MED ORDER — EPINEPHRINE 0.3 MG/0.3ML IJ SOAJ: 0.3000 mg | INTRAMUSCULAR | 1 refills | Status: AC | PRN

## 2023-02-06 ENCOUNTER — Ambulatory Visit: Payer: 59 | Admitting: Internal Medicine

## 2023-02-06 ENCOUNTER — Encounter: Payer: Self-pay | Admitting: Internal Medicine

## 2023-02-06 ENCOUNTER — Telehealth (INDEPENDENT_AMBULATORY_CARE_PROVIDER_SITE_OTHER): Payer: 59 | Admitting: Internal Medicine

## 2023-02-06 DIAGNOSIS — J029 Acute pharyngitis, unspecified: Secondary | ICD-10-CM

## 2023-02-06 MED ORDER — AZITHROMYCIN 250 MG PO TABS
ORAL_TABLET | ORAL | 0 refills | Status: AC
Start: 2023-02-06 — End: 2023-02-11

## 2023-02-06 NOTE — Progress Notes (Addendum)
Date:  02/06/2023   Name:  Molly Cisneros   DOB:  1985-06-09   MRN:  [id number]  This encounter was conducted via video encounter. This platform was deemed appropriate for the issues to be addressed.  The patient was correctly identified.  I advised that I am conducting the visit from a secure room in my office at [facility] clinic.  The patient is located at work. The limitations of this form of encounter were discussed with the patient and he/she agreed to proceed.  Some vital signs will be absent.  Chief Complaint: Cough (Cough- no production, sore throat, negative covid. No fever or SOB. X 3 days. )  Cough This is a new problem. The current episode started in the past 7 days. The problem occurs every few minutes. The cough is Non-productive. Associated symptoms include a sore throat. Pertinent negatives include no chest pain, chills, ear pain, fever, headaches, postnasal drip or shortness of breath.  Sore Throat  This is a new problem. The current episode started yesterday. There has been no fever. Associated symptoms include coughing, a hoarse voice, swollen glands and trouble swallowing. Pertinent negatives include no congestion, diarrhea, ear pain, headaches or shortness of breath.    Lab Results  Component Value Date   NA 137 10/09/2022   K 4.3 10/09/2022   CO2 22 10/09/2022   GLUCOSE 82 10/09/2022   BUN 7 10/09/2022   CREATININE 0.75 10/09/2022   CALCIUM 9.4 10/09/2022   EGFR 104 10/09/2022   GFRNONAA >60 08/07/2020   Lab Results  Component Value Date   CHOL 170 10/09/2022   HDL 39 (L) 10/09/2022   LDLCALC 108 (H) 10/09/2022   TRIG 129 10/09/2022   CHOLHDL 4.4 10/09/2022   Lab Results  Component Value Date   TSH 3.600 10/09/2022   Lab Results  Component Value Date   HGBA1C 5.5 02/12/2021   Lab Results  Component Value Date   WBC 11.3 (H) 10/09/2022   HGB 14.0 10/09/2022   HCT 42.3 10/09/2022   MCV 94 10/09/2022   PLT 517 (H) 10/09/2022   Lab Results   Component Value Date   ALT 23 10/09/2022   AST 18 10/09/2022   GGT 32 06/11/2019   ALKPHOS 79 10/09/2022   BILITOT 0.5 10/09/2022   Lab Results  Component Value Date   VD25OH 18.1 (L) 10/01/2021     Review of Systems  Constitutional:  Negative for chills, fatigue and fever.  HENT:  Positive for hoarse voice, sore throat, trouble swallowing and voice change. Negative for congestion, ear pain, postnasal drip and sinus pressure.   Eyes:  Negative for visual disturbance.  Respiratory:  Positive for cough. Negative for chest tightness and shortness of breath.   Cardiovascular:  Negative for chest pain.  Gastrointestinal:  Negative for diarrhea.  Neurological:  Negative for headaches.    Patient Active Problem List   Diagnosis Date Noted   Menorrhagia with regular cycle 02/12/2021   OSA (obstructive sleep apnea) 09/17/2020   Rectal fissure 07/18/2020   Headache disorder 11/24/2019   Vitamin D deficiency 07/11/2019   GERD (gastroesophageal reflux disease) 07/01/2019   Depression with anxiety 07/01/2019   Folate deficiency 06/30/2019   B12 deficiency 06/30/2019   Orthostasis 06/21/2019   Chronic diarrhea of unknown origin    Essential hypertension 11/03/2018   Hyperlipidemia, mixed 11/03/2018   Degeneration of C5-C6 intervertebral disc 08/31/2018   Herniation of left side of L4-L5 intervertebral disc 06/05/2017   Obesity (BMI  30-39.9) 06/05/2017    Allergies  Allergen Reactions   Shellfish Allergy Anaphylaxis   Aloe Vera Dermatitis   Latex     Other reaction(s): Unknown    Past Surgical History:  Procedure Laterality Date   COLONOSCOPY WITH PROPOFOL N/A 12/23/2018   Procedure: COLONOSCOPY WITH PROPOFOL;  Surgeon: Toney Reil, MD;  Location: [facility] ENDOSCOPY;  Service: Gastroenterology;  Laterality: N/A;   ESOPHAGOGASTRODUODENOSCOPY (EGD) WITH PROPOFOL N/A 12/23/2018   Procedure: ESOPHAGOGASTRODUODENOSCOPY (EGD) WITH PROPOFOL;  Surgeon: Toney Reil, MD;   Location: [facility] ENDOSCOPY;  Service: Gastroenterology;  Laterality: N/A;   GIVENS CAPSULE STUDY N/A 06/14/2019   Procedure: GIVENS CAPSULE STUDY;  Surgeon: Toney Reil, MD;  Location: [facility] ENDOSCOPY;  Service: Gastroenterology;  Laterality: N/A;   GIVENS CAPSULE STUDY N/A 06/16/2019   Procedure: GIVENS CAPSULE STUDY;  Surgeon: Toney Reil, MD;  Location: [facility] ENDOSCOPY;  Service: Gastroenterology;  Laterality: N/A;   LAPAROSCOPIC APPENDECTOMY N/A 11/06/2018   Procedure: APPENDECTOMY LAPAROSCOPIC;  Surgeon: Carolan Shiver, MD;  Location: [facility] ORS;  Service: General;  Laterality: N/A;    Social History   Tobacco Use   Smoking status: Never   Smokeless tobacco: Never  Vaping Use   Vaping status: Never Used  Substance Use Topics   Alcohol use: Yes    Alcohol/week: 2.0 standard drinks of alcohol    Types: 2 Cans of beer per week   Drug use: No     Medication list has been reviewed and updated.  Current Meds  Medication Sig   albuterol (VENTOLIN HFA) 108 (90 Base) MCG/ACT inhaler Inhale 2 puffs into the lungs every 6 (six) hours as needed for wheezing or shortness of breath.   ALPRAZolam (XANAX) 1 MG tablet Take 1 mg by mouth 3 (three) times daily as needed for anxiety.    amLODipine (NORVASC) 10 MG tablet TAKE ONE (1) TABLET BY MOUTH ONCE DAILY   amphetamine-dextroamphetamine (ADDERALL) 20 MG tablet Take 20 mg by mouth 2 (two) times daily.   atorvastatin (LIPITOR) 20 MG tablet TAKE (1) TABLET BY MOUTH EVERY DAY   azithromycin (ZITHROMAX Z-PAK) 250 MG tablet UAD   cyclobenzaprine (FLEXERIL) 5 MG tablet TAKE (1) TABLET BY MOUTH THREE TIMES A DAY (Patient taking differently: as needed.)   DULoxetine (CYMBALTA) 60 MG capsule Take 60 mg by mouth daily.    EPINEPHrine 0.3 mg/0.3 mL IJ SOAJ injection Inject 0.3 mg into the muscle as needed for anaphylaxis.   etonogestrel-ethinyl estradiol (NUVARING) 0.12-0.015 MG/24HR vaginal ring USE AS DIRECTED   gabapentin (NEURONTIN)  300 MG capsule Take 1 capsule (300 mg total) by mouth 2 (two) times daily. (Patient taking differently: Take 300 mg by mouth as needed.)   hydrOXYzine (VISTARIL) 25 MG capsule TAKE 1 TO 2 CAPSULES BY MOUTH EVERY 6 HOURS   lidocaine-prilocaine (EMLA) cream Apply 1 application topically as needed.   mupirocin ointment (BACTROBAN) 2 % Apply 1 Application topically 2 (two) times daily.   olmesartan (BENICAR) 40 MG tablet Take 1 tablet (40 mg total) by mouth daily.   pantoprazole (PROTONIX) 40 MG tablet TAKE (1) TABLET BY MOUTH EVERY DAY   promethazine (PHENERGAN) 25 MG tablet Take 1 tablet (25 mg total) by mouth every 8 (eight) hours as needed for nausea or vomiting.   valACYclovir (VALTREX) 1000 MG tablet Take 1 tablet (1,000 mg total) by mouth 2 (two) times daily as needed.       02/06/2023   11:03 AM 10/03/2022   10:32 AM 02/12/2021  1:41 PM 11/13/2020    4:19 PM  GAD 7 : Generalized Anxiety Score  Nervous, Anxious, on Edge 0 1 3 0  Control/stop worrying 0 1 3 0  Worry too much - different things 0 1 0 0  Trouble relaxing 0 0 3 0  Restless 0 0 0 0  Easily annoyed or irritable 0 0 3 0  Afraid - awful might happen 0 0 0 0  Total GAD 7 Score 0 3 12 0  Anxiety Difficulty Not difficult at all Somewhat difficult Very difficult        02/06/2023   11:03 AM 10/03/2022   10:32 AM 02/12/2021    1:40 PM  Depression screen PHQ 2/9  Decreased Interest 0 0 0  Down, Depressed, Hopeless 0 3 0  PHQ - 2 Score 0 3 0  Altered sleeping 0 3 3  Tired, decreased energy 0 0 3  Change in appetite 0 0 0  Feeling bad or failure about yourself  0 0 0  Trouble concentrating 0 0 0  Moving slowly or fidgety/restless 0 0 0  Suicidal thoughts 0 0 0  PHQ-9 Score 0 6 6  Difficult doing work/chores Not difficult at all Not difficult at all Very difficult    BP Readings from Last 3 Encounters:  10/03/22 134/82  10/01/21 130/78  02/12/21 136/74    Physical Exam Vitals and nursing note reviewed.   Constitutional:      General: She is not in acute distress.    Appearance: She is well-developed.  HENT:     Nose:     Right Sinus: No maxillary sinus tenderness or frontal sinus tenderness.     Left Sinus: No maxillary sinus tenderness or frontal sinus tenderness.     Mouth/Throat:     Comments: Voice very hoarse Pulmonary:     Effort: Pulmonary effort is normal. No respiratory distress.  Skin:    Findings: Rash present.  Neurological:     Mental Status: She is alert and oriented to person, place, and time.  Psychiatric:        Mood and Affect: Mood normal.        Behavior: Behavior normal.     Wt Readings from Last 3 Encounters:  10/03/22 228 lb (103.4 kg)  10/01/21 212 lb (96.2 kg)  02/12/21 233 lb (105.7 kg)    There were no vitals taken for this visit.  Assessment and Plan:  Problem List Items Addressed This Visit   None Visit Diagnoses     Pharyngitis, unspecified etiology    -  Primary   suspect viral but has hx of bacterial infections/sinusitis will treat with Zpak recommend Advil/tylenol; robitussin or mucinex for cough   Relevant Medications   azithromycin (ZITHROMAX Z-PAK) 250 MG tablet      I spent 5 minutes on this encounter, 100% via video. No follow-ups on file.    Reubin Milan, MD [facility] Medicine [place]

## 2023-02-06 NOTE — Patient Instructions (Signed)
Take Advil or Tylenol as needed.  Robitussin or Mucinex twice a day might help loosen chest congestion.

## 2023-02-09 ENCOUNTER — Other Ambulatory Visit: Payer: Self-pay | Admitting: Internal Medicine

## 2023-02-09 ENCOUNTER — Telehealth: Payer: Self-pay

## 2023-02-09 DIAGNOSIS — J029 Acute pharyngitis, unspecified: Secondary | ICD-10-CM

## 2023-02-09 MED ORDER — PROMETHAZINE-DM 6.25-15 MG/5ML PO SYRP
5.0000 mL | ORAL_SOLUTION | Freq: Four times a day (QID) | ORAL | 0 refills | Status: AC | PRN
Start: 2023-02-09 — End: 2023-02-18

## 2023-02-09 MED ORDER — AMOXICILLIN-POT CLAVULANATE 875-125 MG PO TABS: 1.0000 | ORAL_TABLET | Freq: Two times a day (BID) | ORAL | 0 refills | Status: AC

## 2023-02-09 MED ORDER — PREDNISONE 10 MG PO TABS
ORAL_TABLET | ORAL | 0 refills | Status: AC
Start: 2023-02-09 — End: 2023-02-14

## 2023-02-09 NOTE — Telephone Encounter (Signed)
Patient contacted office and said she still feels very sick. Has cough, headache, sore throat, and no voice. She said she also has a little pressure in her chest when she breathes.   Please advise. It looks like patient was prescribed a Z- pack Friday.  - Avalynne Diver

## 2023-03-11 ENCOUNTER — Other Ambulatory Visit: Payer: Self-pay | Admitting: Internal Medicine

## 2023-03-20 ENCOUNTER — Other Ambulatory Visit: Payer: Self-pay | Admitting: Internal Medicine

## 2023-03-20 DIAGNOSIS — K219 Gastro-esophageal reflux disease without esophagitis: Secondary | ICD-10-CM

## 2023-04-06 ENCOUNTER — Other Ambulatory Visit: Payer: Self-pay

## 2023-04-06 DIAGNOSIS — B379 Candidiasis, unspecified: Secondary | ICD-10-CM

## 2023-04-06 MED ORDER — VALACYCLOVIR HCL 1 G PO TABS: 1000.0000 mg | ORAL_TABLET | Freq: Two times a day (BID) | ORAL | 5 refills | Status: AC | PRN

## 2023-04-06 MED ORDER — NYSTATIN 100000 UNIT/GM EX CREA: 1.0000 | TOPICAL_CREAM | Freq: Two times a day (BID) | CUTANEOUS | 0 refills | Status: DC

## 2023-04-06 MED ORDER — FLUCONAZOLE 200 MG PO TABS: 200.0000 mg | ORAL_TABLET | Freq: Every day | ORAL | 0 refills | Status: DC

## 2023-07-07 ENCOUNTER — Encounter: Payer: Self-pay | Admitting: Internal Medicine

## 2023-07-07 ENCOUNTER — Ambulatory Visit (INDEPENDENT_AMBULATORY_CARE_PROVIDER_SITE_OTHER): Payer: 59 | Admitting: Internal Medicine

## 2023-07-07 VITALS — BP 128/72 | HR 136 | Temp 98.3°F | Ht 72.0 in | Wt 227.0 lb

## 2023-07-07 DIAGNOSIS — R6889 Other general symptoms and signs: Secondary | ICD-10-CM | POA: Diagnosis not present

## 2023-07-07 LAB — POCT INFLUENZA A/B
Influenza A, POC: NEGATIVE
Influenza B, POC: NEGATIVE

## 2023-07-07 LAB — POC COVID19 BINAXNOW: SARS Coronavirus 2 Ag: NEGATIVE

## 2023-07-07 MED ORDER — AZITHROMYCIN 250 MG PO TABS: ORAL_TABLET | ORAL | 0 refills | Status: AC

## 2023-07-07 MED ORDER — PROMETHAZINE-DM 6.25-15 MG/5ML PO SYRP: 5.0000 mL | ORAL_SOLUTION | Freq: Four times a day (QID) | ORAL | 0 refills | Status: AC | PRN

## 2023-07-07 NOTE — Progress Notes (Signed)
 Date:  07/07/2023   Name:  Molly Cisneros   DOB:  1985/08/01   MRN:  [id number]   Chief Complaint: Fever (Cough, congestion, fever, body aches.)  URI  This is a new problem. The current episode started yesterday. The maximum temperature recorded prior to her arrival was 100.4 - 100.9 F. Associated symptoms include congestion, coughing and headaches. Pertinent negatives include no chest pain, nausea or vomiting.    Review of Systems  Constitutional:  Positive for chills, fatigue and fever.  HENT:  Positive for congestion.   Respiratory:  Positive for cough and chest tightness.   Cardiovascular:  Negative for chest pain and palpitations.  Gastrointestinal:  Negative for nausea and vomiting.  Neurological:  Positive for headaches.  Psychiatric/Behavioral:  Negative for dysphoric mood and sleep disturbance. The patient is not nervous/anxious.      Lab Results  Component Value Date   NA 137 10/09/2022   K 4.3 10/09/2022   CO2 22 10/09/2022   GLUCOSE 82 10/09/2022   BUN 7 10/09/2022   CREATININE 0.75 10/09/2022   CALCIUM 9.4 10/09/2022   EGFR 104 10/09/2022   GFRNONAA >60 08/07/2020   Lab Results  Component Value Date   CHOL 170 10/09/2022   HDL 39 (L) 10/09/2022   LDLCALC 108 (H) 10/09/2022   TRIG 129 10/09/2022   CHOLHDL 4.4 10/09/2022   Lab Results  Component Value Date   TSH 3.600 10/09/2022   Lab Results  Component Value Date   HGBA1C 5.5 02/12/2021   Lab Results  Component Value Date   WBC 11.3 (H) 10/09/2022   HGB 14.0 10/09/2022   HCT 42.3 10/09/2022   MCV 94 10/09/2022   PLT 517 (H) 10/09/2022   Lab Results  Component Value Date   ALT 23 10/09/2022   AST 18 10/09/2022   GGT 32 06/11/2019   ALKPHOS 79 10/09/2022   BILITOT 0.5 10/09/2022   Lab Results  Component Value Date   VD25OH 18.1 (L) 10/01/2021     Patient Active Problem List   Diagnosis Date Noted   Menorrhagia with regular cycle 02/12/2021   OSA (obstructive sleep apnea) 09/17/2020    Rectal fissure 07/18/2020   Headache disorder 11/24/2019   Vitamin D deficiency 07/11/2019   GERD (gastroesophageal reflux disease) 07/01/2019   Depression with anxiety 07/01/2019   Folate deficiency 06/30/2019   B12 deficiency 06/30/2019   Orthostasis 06/21/2019   Chronic diarrhea of unknown origin    Essential hypertension 11/03/2018   Hyperlipidemia, mixed 11/03/2018   Degeneration of C5-C6 intervertebral disc 08/31/2018   Herniation of left side of L4-L5 intervertebral disc 06/05/2017   Obesity (BMI 30-39.9) 06/05/2017    Allergies  Allergen Reactions   Shellfish Allergy Anaphylaxis   Aloe Vera Dermatitis   Latex     Other reaction(s): Unknown    Past Surgical History:  Procedure Laterality Date   COLONOSCOPY WITH PROPOFOL N/A 12/23/2018   Procedure: COLONOSCOPY WITH PROPOFOL;  Surgeon: Toney Reil, MD;  Location: [facility] ENDOSCOPY;  Service: Gastroenterology;  Laterality: N/A;   ESOPHAGOGASTRODUODENOSCOPY (EGD) WITH PROPOFOL N/A 12/23/2018   Procedure: ESOPHAGOGASTRODUODENOSCOPY (EGD) WITH PROPOFOL;  Surgeon: Toney Reil, MD;  Location: [facility] ENDOSCOPY;  Service: Gastroenterology;  Laterality: N/A;   GIVENS CAPSULE STUDY N/A 06/14/2019   Procedure: GIVENS CAPSULE STUDY;  Surgeon: Toney Reil, MD;  Location: [facility] ENDOSCOPY;  Service: Gastroenterology;  Laterality: N/A;   GIVENS CAPSULE STUDY N/A 06/16/2019   Procedure: GIVENS CAPSULE STUDY;  Surgeon: Lannette Donath  Betti Cruz, MD;  Location: [facility] ENDOSCOPY;  Service: Gastroenterology;  Laterality: N/A;   LAPAROSCOPIC APPENDECTOMY N/A 11/06/2018   Procedure: APPENDECTOMY LAPAROSCOPIC;  Surgeon: Carolan Shiver, MD;  Location: [facility] ORS;  Service: General;  Laterality: N/A;    Social History   Tobacco Use   Smoking status: Never   Smokeless tobacco: Never  Vaping Use   Vaping status: Never Used  Substance Use Topics   Alcohol use: Yes    Alcohol/week: 2.0 standard drinks of alcohol    Types: 2 Cans of  beer per week   Drug use: No     Medication list has been reviewed and updated.  Current Meds  Medication Sig   albuterol (VENTOLIN HFA) 108 (90 Base) MCG/ACT inhaler Inhale 2 puffs into the lungs every 6 (six) hours as needed for wheezing or shortness of breath.   ALPRAZolam (XANAX) 1 MG tablet Take 1 mg by mouth 3 (three) times daily as needed for anxiety.    amLODipine (NORVASC) 10 MG tablet TAKE ONE (1) TABLET BY MOUTH ONCE DAILY   amphetamine-dextroamphetamine (ADDERALL) 20 MG tablet Take 20 mg by mouth 2 (two) times daily.   atorvastatin (LIPITOR) 20 MG tablet TAKE (1) TABLET BY MOUTH EVERY DAY   azithromycin (ZITHROMAX Z-PAK) 250 MG tablet UAD   cyclobenzaprine (FLEXERIL) 5 MG tablet TAKE (1) TABLET BY MOUTH THREE TIMES A DAY (Patient taking differently: as needed.)   DULoxetine (CYMBALTA) 60 MG capsule Take 60 mg by mouth daily.    EPINEPHrine 0.3 mg/0.3 mL IJ SOAJ injection Inject 0.3 mg into the muscle as needed for anaphylaxis.   etonogestrel-ethinyl estradiol (NUVARING) 0.12-0.015 MG/24HR vaginal ring USE AS DIRECTED   fluconazole (DIFLUCAN) 200 MG tablet Take 1 tablet (200 mg total) by mouth daily.   gabapentin (NEURONTIN) 300 MG capsule Take 1 capsule (300 mg total) by mouth 2 (two) times daily. (Patient taking differently: Take 300 mg by mouth as needed.)   hydrOXYzine (VISTARIL) 25 MG capsule TAKE 1 TO 2 CAPSULES BY MOUTH EVERY 6 HOURS   lidocaine-prilocaine (EMLA) cream Apply 1 application topically as needed.   mupirocin ointment (BACTROBAN) 2 % Apply 1 Application topically 2 (two) times daily.   nystatin cream (MYCOSTATIN) Apply 1 Application topically 2 (two) times daily.   olmesartan (BENICAR) 40 MG tablet Take 1 tablet (40 mg total) by mouth daily.   pantoprazole (PROTONIX) 40 MG tablet TAKE (1) TABLET BY MOUTH EVERY DAY   promethazine (PHENERGAN) 25 MG tablet Take 1 tablet (25 mg total) by mouth every 8 (eight) hours as needed for nausea or vomiting.    promethazine-dextromethorphan (PROMETHAZINE-DM) 6.25-15 MG/5ML syrup Take 5 mLs by mouth 4 (four) times daily as needed for up to 9 days for cough.   valACYclovir (VALTREX) 1000 MG tablet Take 1 tablet (1,000 mg total) by mouth 2 (two) times daily as needed.       07/07/2023   10:41 AM 02/06/2023   11:03 AM 10/03/2022   10:32 AM 02/12/2021    1:41 PM  GAD 7 : Generalized Anxiety Score  Nervous, Anxious, on Edge 0 0 1 3  Control/stop worrying 0 0 1 3  Worry too much - different things 0 0 1 0  Trouble relaxing 0 0 0 3  Restless 0 0 0 0  Easily annoyed or irritable 0 0 0 3  Afraid - awful might happen 0 0 0 0  Total GAD 7 Score 0 0 3 12  Anxiety Difficulty Not difficult at all Not difficult  at all Somewhat difficult Very difficult       07/07/2023   10:41 AM 02/06/2023   11:03 AM 10/03/2022   10:32 AM  Depression screen PHQ 2/9  Decreased Interest 0 0 0  Down, Depressed, Hopeless 0 0 3  PHQ - 2 Score 0 0 3  Altered sleeping 0 0 3  Tired, decreased energy 0 0 0  Change in appetite 0 0 0  Feeling bad or failure about yourself  0 0 0  Trouble concentrating 0 0 0  Moving slowly or fidgety/restless 0 0 0  Suicidal thoughts 0 0 0  PHQ-9 Score 0 0 6  Difficult doing work/chores Not difficult at all Not difficult at all Not difficult at all    BP Readings from Last 3 Encounters:  07/07/23 128/72  10/03/22 134/82  10/01/21 130/78    Physical Exam Constitutional:      Appearance: She is ill-appearing.  HENT:     Right Ear: Tympanic membrane is retracted. Tympanic membrane is not erythematous.     Left Ear: Tympanic membrane is retracted. Tympanic membrane is not erythematous.     Nose:     Right Sinus: No maxillary sinus tenderness.     Left Sinus: No maxillary sinus tenderness.     Mouth/Throat:     Pharynx: Posterior oropharyngeal erythema present. No oropharyngeal exudate.  Cardiovascular:     Rate and Rhythm: Normal rate and regular rhythm.     Heart sounds: No murmur  heard. Pulmonary:     Effort: No respiratory distress.     Breath sounds: No wheezing or rhonchi.  Musculoskeletal:     Cervical back: Normal range of motion.  Lymphadenopathy:     Cervical: No cervical adenopathy.  Neurological:     Mental Status: She is alert.     Wt Readings from Last 3 Encounters:  07/07/23 227 lb (103 kg)  10/03/22 228 lb (103.4 kg)  10/01/21 212 lb (96.2 kg)    BP 128/72   Pulse (!) 136   Temp 98.3 F (36.8 C) (Oral)   Ht 6' (1.829 m)   Wt 227 lb (103 kg)   SpO2 96%   BMI 30.79 kg/m   Assessment and Plan:  Problem List Items Addressed This Visit   None Visit Diagnoses       Flu-like symptoms    -  Primary   Push fluids, take Mucinex for congestion Tylenol q6h Rx cough syrup and zpak   Relevant Medications   azithromycin (ZITHROMAX Z-PAK) 250 MG tablet   promethazine-dextromethorphan (PROMETHAZINE-DM) 6.25-15 MG/5ML syrup   Other Relevant Orders   POC COVID-19 BinaxNow   POCT Influenza A/B       No follow-ups on file.    Reubin Milan, MD [facility] [place]

## 2023-07-09 ENCOUNTER — Telehealth: Payer: Self-pay

## 2023-07-09 NOTE — Telephone Encounter (Signed)
 Pt stated the friend who was at the concert with her has tested positive for Influenza A. Wants to know if medication can be sent in (Tamiflu).

## 2023-07-10 ENCOUNTER — Other Ambulatory Visit: Payer: Self-pay | Admitting: Internal Medicine

## 2023-07-10 DIAGNOSIS — R110 Nausea: Secondary | ICD-10-CM

## 2023-07-10 DIAGNOSIS — R6889 Other general symptoms and signs: Secondary | ICD-10-CM

## 2023-07-10 MED ORDER — PROMETHAZINE HCL 25 MG PO TABS: 25.0000 mg | ORAL_TABLET | Freq: Three times a day (TID) | ORAL | 0 refills | Status: AC | PRN

## 2023-07-10 MED ORDER — OSELTAMIVIR PHOSPHATE 75 MG PO CAPS: 75.0000 mg | ORAL_CAPSULE | Freq: Two times a day (BID) | ORAL | 0 refills | Status: AC

## 2023-07-10 NOTE — Progress Notes (Unsigned)
 Date:  07/10/2023   Name:  Molly Cisneros   DOB:  [birth year]   MRN:  [id number]   Chief Complaint: No chief complaint on file.  HPI  Review of Systems   Lab Results  Component Value Date   NA 137 10/09/2022   K 4.3 10/09/2022   CO2 22 10/09/2022   GLUCOSE 82 10/09/2022   BUN 7 10/09/2022   CREATININE 0.75 10/09/2022   CALCIUM 9.4 10/09/2022   EGFR 104 10/09/2022   GFRNONAA >60 08/07/2020   Lab Results  Component Value Date   CHOL 170 10/09/2022   HDL 39 (L) 10/09/2022   LDLCALC 108 (H) 10/09/2022   TRIG 129 10/09/2022   CHOLHDL 4.4 10/09/2022   Lab Results  Component Value Date   TSH 3.600 10/09/2022   Lab Results  Component Value Date   HGBA1C 5.5 02/12/2021   Lab Results  Component Value Date   WBC 11.3 (H) 10/09/2022   HGB 14.0 10/09/2022   HCT 42.3 10/09/2022   MCV 94 10/09/2022   PLT 517 (H) 10/09/2022   Lab Results  Component Value Date   ALT 23 10/09/2022   AST 18 10/09/2022   GGT 32 06/11/2019   ALKPHOS 79 10/09/2022   BILITOT 0.5 10/09/2022   Lab Results  Component Value Date   VD25OH 18.1 (L) 10/01/2021     Patient Active Problem List   Diagnosis Date Noted   Menorrhagia with regular cycle 02/12/2021   OSA (obstructive sleep apnea) 09/17/2020   Rectal fissure 07/18/2020   Headache disorder 11/24/2019   Vitamin D deficiency 07/11/2019   GERD (gastroesophageal reflux disease) 07/01/2019   Depression with anxiety 07/01/2019   Folate deficiency 06/30/2019   B12 deficiency 06/30/2019   Orthostasis 06/21/2019   Chronic diarrhea of unknown origin    Essential hypertension 11/03/2018   Hyperlipidemia, mixed 11/03/2018   Degeneration of C5-C6 intervertebral disc 08/31/2018   Herniation of left side of L4-L5 intervertebral disc 06/05/2017   Obesity (BMI 30-39.9) 06/05/2017    Allergies  Allergen Reactions   Shellfish Allergy Anaphylaxis   Aloe Vera Dermatitis   Latex     Other reaction(s): Unknown    Past Surgical History:   Procedure Laterality Date   COLONOSCOPY WITH PROPOFOL N/A 12/23/2018   Procedure: COLONOSCOPY WITH PROPOFOL;  Surgeon: Toney Reil, MD;  Location: [facility] ENDOSCOPY;  Service: Gastroenterology;  Laterality: N/A;   ESOPHAGOGASTRODUODENOSCOPY (EGD) WITH PROPOFOL N/A 12/23/2018   Procedure: ESOPHAGOGASTRODUODENOSCOPY (EGD) WITH PROPOFOL;  Surgeon: Toney Reil, MD;  Location: [facility] ENDOSCOPY;  Service: Gastroenterology;  Laterality: N/A;   GIVENS CAPSULE STUDY N/A 06/14/2019   Procedure: GIVENS CAPSULE STUDY;  Surgeon: Toney Reil, MD;  Location: [facility] ENDOSCOPY;  Service: Gastroenterology;  Laterality: N/A;   GIVENS CAPSULE STUDY N/A 06/16/2019   Procedure: GIVENS CAPSULE STUDY;  Surgeon: Toney Reil, MD;  Location: [facility] ENDOSCOPY;  Service: Gastroenterology;  Laterality: N/A;   LAPAROSCOPIC APPENDECTOMY N/A 11/06/2018   Procedure: APPENDECTOMY LAPAROSCOPIC;  Surgeon: Carolan Shiver, MD;  Location: [facility] ORS;  Service: General;  Laterality: N/A;    Social History   Tobacco Use   Smoking status: Never   Smokeless tobacco: Never  Vaping Use   Vaping status: Never Used  Substance Use Topics   Alcohol use: Yes    Alcohol/week: 2.0 standard drinks of alcohol    Types: 2 Cans of beer per week   Drug use: No     Medication list has been reviewed and updated.  No outpatient medications have been marked as taking for the 07/10/23 encounter (Orders Only) with Reubin Milan, MD.       07/07/2023   10:41 AM 02/06/2023   11:03 AM 10/03/2022   10:32 AM 02/12/2021    1:41 PM  GAD 7 : Generalized Anxiety Score  Nervous, Anxious, on Edge 0 0 1 3  Control/stop worrying 0 0 1 3  Worry too much - different things 0 0 1 0  Trouble relaxing 0 0 0 3  Restless 0 0 0 0  Easily annoyed or irritable 0 0 0 3  Afraid - awful might happen 0 0 0 0  Total GAD 7 Score 0 0 3 12  Anxiety Difficulty Not difficult at all Not difficult at all Somewhat difficult Very difficult        07/07/2023   10:41 AM 02/06/2023   11:03 AM 10/03/2022   10:32 AM  Depression screen PHQ 2/9  Decreased Interest 0 0 0  Down, Depressed, Hopeless 0 0 3  PHQ - 2 Score 0 0 3  Altered sleeping 0 0 3  Tired, decreased energy 0 0 0  Change in appetite 0 0 0  Feeling bad or failure about yourself  0 0 0  Trouble concentrating 0 0 0  Moving slowly or fidgety/restless 0 0 0  Suicidal thoughts 0 0 0  PHQ-9 Score 0 0 6  Difficult doing work/chores Not difficult at all Not difficult at all Not difficult at all    BP Readings from Last 3 Encounters:  07/07/23 128/72  10/03/22 134/82  10/01/21 130/78    Physical Exam  Wt Readings from Last 3 Encounters:  07/07/23 227 lb (103 kg)  10/03/22 228 lb (103.4 kg)  10/01/21 212 lb (96.2 kg)    There were no vitals taken for this visit.  Assessment and Plan:  Problem List Items Addressed This Visit   None   No follow-ups on file.    Reubin Milan, MD [facility] [place]

## 2023-07-22 ENCOUNTER — Other Ambulatory Visit: Payer: Self-pay | Admitting: Internal Medicine

## 2023-07-23 NOTE — Telephone Encounter (Signed)
 Requested Prescriptions  Pending Prescriptions Disp Refills   hydrOXYzine (VISTARIL) 25 MG capsule [Pharmacy Med Name: HYDROXYZINE PAMOATE 25MG  CAPSULE] 60 capsule 0    Sig: TAKE ONE (1) TO TWO (2) CAPSULES BY MOUTH EVERY SIX (6) HOURS     Ear, Nose, and Throat:  Antihistamines 2 Passed - 07/23/2023  1:32 PM      Passed - Cr in normal range and within 360 days    Creatinine, Ser  Date Value Ref Range Status  10/09/2022 0.75 0.57 - 1.00 mg/dL Final   Creatinine, Random U  Date Value Ref Range Status  05/18/2019 155.0 Not Estab. mg/dL Final   Creatinine, Urine  Date Value Ref Range Status  07/01/2019 27 mg/dL Final    Comment:    Performed at [facility], [address], [place], [place] [address]         Passed - Valid encounter within last 12 months    Recent Outpatient Visits           5 months ago Pharyngitis, unspecified etiology   [place] [facility] at [facility], Nyoka Cowden, MD   9 months ago Acute pain of right knee   [facility] at [facility], Nyoka Cowden, MD   1 year ago Essential hypertension   [place] [facility] at [facility], Nyoka Cowden, MD   2 years ago Essential hypertension   [place] [facility] at [facility], Nyoka Cowden, MD   2 years ago COVID-19 virus infection   [facility] at [facility], Nyoka Cowden, MD

## 2023-08-07 DIAGNOSIS — K219 Gastro-esophageal reflux disease without esophagitis: Secondary | ICD-10-CM | POA: Diagnosis not present

## 2023-08-07 DIAGNOSIS — E782 Mixed hyperlipidemia: Secondary | ICD-10-CM | POA: Diagnosis not present

## 2023-08-07 DIAGNOSIS — E669 Obesity, unspecified: Secondary | ICD-10-CM | POA: Diagnosis not present

## 2023-08-07 DIAGNOSIS — R519 Headache, unspecified: Secondary | ICD-10-CM | POA: Diagnosis not present

## 2023-08-07 DIAGNOSIS — Z1331 Encounter for screening for depression: Secondary | ICD-10-CM | POA: Diagnosis not present

## 2023-08-07 DIAGNOSIS — G4733 Obstructive sleep apnea (adult) (pediatric): Secondary | ICD-10-CM | POA: Diagnosis not present

## 2023-08-07 DIAGNOSIS — F418 Other specified anxiety disorders: Secondary | ICD-10-CM | POA: Diagnosis not present

## 2023-08-07 DIAGNOSIS — Z113 Encounter for screening for infections with a predominantly sexual mode of transmission: Secondary | ICD-10-CM | POA: Diagnosis not present

## 2023-08-07 DIAGNOSIS — Z1159 Encounter for screening for other viral diseases: Secondary | ICD-10-CM | POA: Diagnosis not present

## 2023-08-07 DIAGNOSIS — Z79899 Other long term (current) drug therapy: Secondary | ICD-10-CM | POA: Diagnosis not present

## 2023-08-07 DIAGNOSIS — I10 Essential (primary) hypertension: Secondary | ICD-10-CM | POA: Diagnosis not present

## 2023-08-27 DIAGNOSIS — F900 Attention-deficit hyperactivity disorder, predominantly inattentive type: Secondary | ICD-10-CM | POA: Diagnosis not present

## 2023-08-27 DIAGNOSIS — F410 Panic disorder [episodic paroxysmal anxiety] without agoraphobia: Secondary | ICD-10-CM | POA: Diagnosis not present

## 2023-08-27 DIAGNOSIS — F3342 Major depressive disorder, recurrent, in full remission: Secondary | ICD-10-CM | POA: Diagnosis not present

## 2023-09-15 ENCOUNTER — Other Ambulatory Visit: Payer: Self-pay | Admitting: Internal Medicine

## 2023-09-15 DIAGNOSIS — I10 Essential (primary) hypertension: Secondary | ICD-10-CM

## 2023-09-17 NOTE — Telephone Encounter (Signed)
 Requested Prescriptions  Pending Prescriptions Disp Refills   amLODipine  (NORVASC ) 10 MG tablet [Pharmacy Med Name: AMLODIPINE  BESYLATE 10MG  TABLET] 90 tablet 0    Sig: TAKE (1) TABLET BY MOUTH EVERY DAY     Cardiovascular: Calcium  Channel Blockers 2 Failed - 09/17/2023  4:14 PM      Failed - Last Heart Rate in normal range    Pulse Readings from Last 1 Encounters:  07/07/23 (!) 136         Passed - Last BP in normal range    BP Readings from Last 1 Encounters:  07/07/23 128/72         Passed - Valid encounter within last 6 months    Recent Outpatient Visits           2 months ago Flu-like symptoms   [place] [facility] at [facility], Chales [place], MD

## 2023-10-15 ENCOUNTER — Other Ambulatory Visit: Payer: Self-pay | Admitting: Internal Medicine

## 2023-10-15 DIAGNOSIS — I10 Essential (primary) hypertension: Secondary | ICD-10-CM

## 2023-11-25 ENCOUNTER — Other Ambulatory Visit: Payer: Self-pay

## 2023-11-25 DIAGNOSIS — D72829 Elevated white blood cell count, unspecified: Secondary | ICD-10-CM | POA: Insufficient documentation

## 2023-11-25 DIAGNOSIS — B029 Zoster without complications: Secondary | ICD-10-CM | POA: Diagnosis not present

## 2023-11-25 DIAGNOSIS — I10 Essential (primary) hypertension: Secondary | ICD-10-CM | POA: Insufficient documentation

## 2023-11-25 DIAGNOSIS — L03811 Cellulitis of head [any part, except face]: Secondary | ICD-10-CM | POA: Insufficient documentation

## 2023-11-25 DIAGNOSIS — Z79899 Other long term (current) drug therapy: Secondary | ICD-10-CM | POA: Insufficient documentation

## 2023-11-25 DIAGNOSIS — L02811 Cutaneous abscess of head [any part, except face]: Secondary | ICD-10-CM | POA: Diagnosis present

## 2023-11-25 NOTE — ED Triage Notes (Signed)
 Pt presents via POV c/o abscess to the top of the head. Reports has had 2 doses of Augmentin . Reports pain is worse.

## 2023-11-26 ENCOUNTER — Encounter: Payer: Self-pay | Admitting: Hematology and Oncology

## 2023-11-26 ENCOUNTER — Emergency Department
Admission: EM | Admit: 2023-11-26 | Discharge: 2023-11-26 | Disposition: A | Discharge status: Home / Self Care | Attending: Emergency Medicine | Admitting: Emergency Medicine

## 2023-11-26 DIAGNOSIS — B028 Zoster with other complications: Secondary | ICD-10-CM

## 2023-11-26 DIAGNOSIS — L03811 Cellulitis of head [any part, except face]: Secondary | ICD-10-CM

## 2023-11-26 LAB — CBC WITH DIFFERENTIAL/PLATELET
Abs Immature Granulocytes: 0.07 K/uL (ref 0.00–0.07)
Basophils Absolute: 0.1 K/uL (ref 0.0–0.1)
Basophils Relative: 1 %
Eosinophils Absolute: 0.3 K/uL (ref 0.0–0.5)
Eosinophils Relative: 3 %
HCT: 38.2 % (ref 36.0–46.0)
Hemoglobin: 12.7 g/dL (ref 12.0–15.0)
Immature Granulocytes: 1 %
Lymphocytes Relative: 41 %
Lymphs Abs: 4.6 K/uL — ABNORMAL HIGH (ref 0.7–4.0)
MCH: 31.1 pg (ref 26.0–34.0)
MCHC: 33.2 g/dL (ref 30.0–36.0)
MCV: 93.4 fL (ref 80.0–100.0)
Monocytes Absolute: 1.2 K/uL — ABNORMAL HIGH (ref 0.1–1.0)
Monocytes Relative: 11 %
Neutro Abs: 4.9 K/uL (ref 1.7–7.7)
Neutrophils Relative %: 43 %
Platelets: 381 K/uL (ref 150–400)
RBC: 4.09 MIL/uL (ref 3.87–5.11)
RDW: 13.2 % (ref 11.5–15.5)
Smear Review: NORMAL
WBC: 11.2 K/uL — ABNORMAL HIGH (ref 4.0–10.5)
nRBC: 0 % (ref 0.0–0.2)

## 2023-11-26 LAB — COMPREHENSIVE METABOLIC PANEL WITH GFR
ALT: 26 U/L (ref 0–44)
AST: 21 U/L (ref 15–41)
Albumin: 3.3 g/dL — ABNORMAL LOW (ref 3.5–5.0)
Alkaline Phosphatase: 93 U/L (ref 38–126)
Anion gap: 9 (ref 5–15)
BUN: 13 mg/dL (ref 6–20)
CO2: 24 mmol/L (ref 22–32)
Calcium: 8.8 mg/dL — ABNORMAL LOW (ref 8.9–10.3)
Chloride: 101 mmol/L (ref 98–111)
Creatinine, Ser: 0.9 mg/dL (ref 0.44–1.00)
GFR, Estimated: >60 mL/min (ref >60)
Glucose, Bld: 105 mg/dL — ABNORMAL HIGH (ref 70–99)
Potassium: 4.9 mmol/L (ref 3.5–5.1)
Sodium: 134 mmol/L — ABNORMAL LOW (ref 135–145)
Total Bilirubin: 0.7 mg/dL (ref 0.0–1.2)
Total Protein: 6.9 g/dL (ref 6.5–8.1)

## 2023-11-26 LAB — LACTIC ACID, PLASMA: Lactic Acid, Venous: 1 mmol/L (ref 0.5–1.9)

## 2023-11-26 LAB — HCG, QUANTITATIVE, PREGNANCY: hCG, Beta Chain, Quant, S: <1 m[IU]/mL (ref <5)

## 2023-11-26 MED ORDER — MORPHINE SULFATE (PF) 4 MG/ML IV SOLN
4.0000 mg | Freq: Once | INTRAVENOUS | Status: AC
  Administered 2023-11-26: 4 mg via INTRAVENOUS
  Filled 2023-11-26: qty 1

## 2023-11-26 MED ORDER — LIDOCAINE HCL (PF) 1 % IJ SOLN
5.0000 mL | Freq: Once | INTRAMUSCULAR | Status: AC
  Administered 2023-11-26: 5 mL via INTRADERMAL
  Filled 2023-11-26: qty 5

## 2023-11-26 MED ORDER — DOXYCYCLINE HYCLATE 100 MG PO TABS: 100.0000 mg | ORAL_TABLET | Freq: Two times a day (BID) | ORAL | 0 refills | Status: DC

## 2023-11-26 MED ORDER — CEPHALEXIN 500 MG PO CAPS: 500.0000 mg | ORAL_CAPSULE | Freq: Four times a day (QID) | ORAL | 0 refills | Status: AC

## 2023-11-26 MED ORDER — ONDANSETRON HCL 4 MG/2ML IJ SOLN
4.0000 mg | Freq: Once | INTRAMUSCULAR | Status: AC
  Administered 2023-11-26: 4 mg via INTRAVENOUS
  Filled 2023-11-26: qty 2

## 2023-11-26 MED ORDER — OXYCODONE HCL 5 MG PO TABS: 5.0000 mg | ORAL_TABLET | Freq: Three times a day (TID) | ORAL | 0 refills | Status: DC | PRN

## 2023-11-26 MED ORDER — SODIUM CHLORIDE 0.9 % IV SOLN
2.0000 g | Freq: Once | INTRAVENOUS | Status: AC
  Administered 2023-11-26: 2 g via INTRAVENOUS
  Filled 2023-11-26: qty 20

## 2023-11-26 MED ORDER — ONDANSETRON 4 MG PO TBDP: 4.0000 mg | ORAL_TABLET | Freq: Four times a day (QID) | ORAL | 0 refills | Status: AC | PRN

## 2023-11-26 MED ORDER — LIDOCAINE-PRILOCAINE 2.5-2.5 % EX CREA
TOPICAL_CREAM | Freq: Once | CUTANEOUS | Status: AC
  Administered 2023-11-26: 1 via TOPICAL
  Filled 2023-11-26: qty 5

## 2023-11-26 MED ORDER — VANCOMYCIN HCL 2000 MG/400ML IV SOLN
2000.0000 mg | Freq: Once | INTRAVENOUS | Status: AC
  Administered 2023-11-26: 2000 mg via INTRAVENOUS
  Filled 2023-11-26: qty 400

## 2023-11-26 NOTE — ED Provider Notes (Signed)
 [facility] Provider Note    Event Date/Time   First MD Initiated Contact with Patient 11/26/23 0222     (approximate)   History   Abscess   HPI  Molly Cisneros is a 38 y.o. female with history of hyperlipidemia, hypertension who presents to the emergency department with complaints of an abscess to the top of her scalp.  Recently diagnosed with shingles and started on Valtrex  on 11/20/2023.  Has history of recurrent shingles to the face.  States that she had a lesion to the top of her scalp and then noticed it was getting painful, swollen.  She was able to drain a small amount of pus and was started on Augmentin  and has taken 2 doses.  States prior to coming in the pain increased to the top of her head and she had significant swelling throughout her face and felt like her vision was more blurry than normal.  No fevers.  Not a diabetic.                  History provided by patient, family.    Past Medical History:  Diagnosis Date   Abdominal pain 10/18/2019   Acute appendicitis with localized peritonitis 11/06/2018   Acute intractable headache    Anxiety    C. difficile diarrhea 02/05/2019   Clostridium difficile diarrhea    High cholesterol    History of Clostridium difficile colitis 07/16/2019   Hypertension    Hypokalemia 02/05/2019   Lactic acidosis 10/18/2019   Loss of weight    Severe sepsis (HCC) 02/05/2019   Shingles    reoccuring shingles on face   Tachycardia 02/12/2021    Past Surgical History:  Procedure Laterality Date   COLONOSCOPY WITH PROPOFOL  N/A 12/23/2018   Procedure: COLONOSCOPY WITH PROPOFOL ;  Surgeon: Unk Corinn Skiff, MD;  Location: [facility] ENDOSCOPY;  Service: Gastroenterology;  Laterality: N/A;   ESOPHAGOGASTRODUODENOSCOPY (EGD) WITH PROPOFOL  N/A 12/23/2018   Procedure: ESOPHAGOGASTRODUODENOSCOPY (EGD) WITH PROPOFOL ;  Surgeon: Unk Corinn Skiff, MD;  Location: [facility] ENDOSCOPY;  Service: Gastroenterology;  Laterality: N/A;    GIVENS CAPSULE STUDY N/A 06/14/2019   Procedure: GIVENS CAPSULE STUDY;  Surgeon: Unk Corinn Skiff, MD;  Location: [facility] ENDOSCOPY;  Service: Gastroenterology;  Laterality: N/A;   GIVENS CAPSULE STUDY N/A 06/16/2019   Procedure: GIVENS CAPSULE STUDY;  Surgeon: Unk Corinn Skiff, MD;  Location: [facility] ENDOSCOPY;  Service: Gastroenterology;  Laterality: N/A;   LAPAROSCOPIC APPENDECTOMY N/A 11/06/2018   Procedure: APPENDECTOMY LAPAROSCOPIC;  Surgeon: Rodolph Romano, MD;  Location: [facility] ORS;  Service: General;  Laterality: N/A;    MEDICATIONS:  Prior to Admission medications   Medication Sig Start Date End Date Taking? Authorizing Provider  albuterol  (VENTOLIN  HFA) 108 (90 Base) MCG/ACT inhaler Inhale 2 puffs into the lungs every 6 (six) hours as needed for wheezing or shortness of breath. 11/13/20   Justus Leita DEL, MD  ALPRAZolam  (XANAX ) 1 MG tablet Take 1 mg by mouth 3 (three) times daily as needed for anxiety.     [provider]  amLODipine  (NORVASC ) 10 MG tablet TAKE (1) TABLET BY MOUTH EVERY DAY 09/17/23   Justus Leita DEL, MD  amphetamine -dextroamphetamine  (ADDERALL ) 20 MG tablet Take 20 mg by mouth 2 (two) times daily. 09/10/21   [provider]  atorvastatin  (LIPITOR) 20 MG tablet TAKE (1) TABLET BY MOUTH EVERY DAY 03/11/23   Justus Leita DEL, MD  cyclobenzaprine  (FLEXERIL ) 5 MG tablet TAKE (1) TABLET BY MOUTH THREE TIMES A DAY Patient taking  differently: as needed. 03/27/20   Justus Leita DEL, MD  DULoxetine  (CYMBALTA ) 60 MG capsule Take 60 mg by mouth daily.  07/18/15   [provider]  EPINEPHrine  0.3 mg/0.3 mL IJ SOAJ injection Inject 0.3 mg into the muscle as needed for anaphylaxis. 01/20/23   Justus Leita DEL, MD  etonogestrel -ethinyl estradiol  (NUVARING) 0.12-0.015 MG/24HR vaginal ring USE AS DIRECTED 01/03/23   Justus Leita DEL, MD  fluconazole  (DIFLUCAN ) 200 MG tablet Take 1 tablet (200 mg total) by mouth daily. 04/06/23   Justus Leita DEL, MD   gabapentin  (NEURONTIN ) 300 MG capsule Take 1 capsule (300 mg total) by mouth 2 (two) times daily. Patient taking differently: Take 300 mg by mouth as needed. 03/20/20   Justus Leita DEL, MD  hydrOXYzine  (VISTARIL ) 25 MG capsule TAKE ONE (1) TO TWO (2) CAPSULES BY MOUTH EVERY SIX (6) HOURS 07/23/23   Justus Leita DEL, MD  lidocaine -prilocaine  (EMLA ) cream Apply 1 application topically as needed. 07/18/20   Berglund, Laura H, MD  mupirocin  ointment (BACTROBAN ) 2 % Apply 1 Application topically 2 (two) times daily. 08/26/22   Justus Leita DEL, MD  nystatin  cream (MYCOSTATIN ) Apply 1 Application topically 2 (two) times daily. 04/06/23   Justus Leita DEL, MD  olmesartan  (BENICAR ) 40 MG tablet Take 1 tablet (40 mg total) by mouth daily. 10/03/22   Justus Leita DEL, MD  pantoprazole  (PROTONIX ) 40 MG tablet TAKE (1) TABLET BY MOUTH EVERY DAY 03/20/23   Justus Leita DEL, MD  promethazine  (PHENERGAN ) 25 MG tablet Take 1 tablet (25 mg total) by mouth every 8 (eight) hours as needed for nausea or vomiting. 07/10/23   Justus Leita DEL, MD  valACYclovir  (VALTREX ) 1000 MG tablet Take 1 tablet (1,000 mg total) by mouth 2 (two) times daily as needed. 04/06/23   Justus Leita DEL, MD    Physical Exam   Triage Vital Signs: ED Triage Vitals [11/25/23 2341]  Encounter Vitals Group     BP 113/77     Girls Systolic BP Percentile      Girls Diastolic BP Percentile      Boys Systolic BP Percentile      Boys Diastolic BP Percentile      Pulse Rate 97     Resp 16     Temp 98.3 F (36.8 C)     Temp Source Oral     SpO2 98 %     Weight      Height      Head Circumference      Peak Flow      Pain Score 7     Pain Loc      Pain Education      Exclude from Growth Chart     Most recent vital signs: Vitals:   11/25/23 2341 11/26/23 0423  BP: 113/77 100/73  Pulse: 97 78  Resp: 16 12  Temp: 98.3 F (36.8 C) 98 F (36.7 C)  SpO2: 98% 98%    CONSTITUTIONAL: Alert, responds appropriately to questions.  Well-appearing; well-nourished HEAD: Normocephalic, atraumatic, patient has a 2 x 3 cm swollen, fluctuant, tender area to the top of the left scalp with surrounding redness and warmth EYES: Conjunctivae clear, pupils appear equal, sclera nonicteric, extraocular movements intact, no hyphema or hypopyon ENT: normal nose; moist mucous membranes, patient has dependent soft tissue swelling noted throughout her face without redness or warmth.  She has scabbed lesions noted to her forehead and 1 small vesicle noted to her left pinna. NECK: Supple, normal  ROM CARD: RRR; S1 and S2 appreciated RESP: Normal chest excursion without splinting or tachypnea; breath sounds clear and equal bilaterally; no wheezes, no rhonchi, no rales, no hypoxia or respiratory distress, speaking full sentences ABD/GI: Non-distended; soft, non-tender, no rebound, no guarding, no peritoneal signs BACK: The back appears normal EXT: Normal ROM in all joints; no deformity noted, no edema SKIN: Normal color for age and race; warm; no rash on exposed skin NEURO: Moves all extremities equally, normal speech PSYCH: The patient's mood and manner are appropriate.   ED Results / Procedures / Treatments   LABS: (all labs ordered are listed, but only abnormal results are displayed) Labs Reviewed  CBC WITH DIFFERENTIAL/PLATELET - Abnormal; Notable for the following components:      Result Value   WBC 11.2 (*)    Lymphs Abs 4.6 (*)    Monocytes Absolute 1.2 (*)    All other components within normal limits  COMPREHENSIVE METABOLIC PANEL WITH GFR - Abnormal; Notable for the following components:   Sodium 134 (*)    Glucose, Bld 105 (*)    Calcium  8.8 (*)    Albumin 3.3 (*)    All other components within normal limits  CULTURE, BLOOD (SINGLE)  AEROBIC/ANAEROBIC CULTURE W GRAM STAIN (SURGICAL/DEEP WOUND)  LACTIC ACID, PLASMA  HCG, QUANTITATIVE, PREGNANCY     EKG:   RADIOLOGY: My personal review and interpretation of imaging:     I have personally reviewed all radiology reports.   No results found.   PROCEDURES:  Critical Care performed: No   INCISION AND DRAINAGE Performed by: Josette Aneshia Jacquet Consent: Verbal consent obtained. Risks and benefits: risks, benefits and alternatives were discussed Type: abscess  Body area: Scalp  Anesthesia: local infiltration  Wound open spontaneously and did not need a stab incision.  Local anesthetic: lidocaine  1% without epinephrine   Anesthetic total: 4 ml  Complexity: complex Blunt dissection to break up loculations  Drainage: purulent  Drainage amount: Large  Packing material: None  Patient tolerance: Patient tolerated the procedure well with no immediate complications.    Procedures    IMPRESSION / MDM / ASSESSMENT AND PLAN / ED COURSE  I reviewed the triage vital signs and the nursing notes.    Patient here with abscess, cellulitis.  She does have facial swelling which I suspect is from dependent edema.  Cellulitis likely developed from active shingles which does appear to be improving.  The patient is on the cardiac monitor to evaluate for evidence of arrhythmia and/or significant heart rate changes.   DIFFERENTIAL DIAGNOSIS (includes but not limited to):   Abscess, cellulitis, shingles, doubt postseptal cellulitis   Patient's presentation is most consistent with acute presentation with potential threat to life or bodily function.   PLAN: Will obtain labs, cultures.  Will perform I&D.  Will give broad IV antibiotics, pain medication.   MEDICATIONS GIVEN IN ED: Medications  vancomycin  (VANCOREADY) IVPB 2000 mg/400 mL (2,000 mg Intravenous New Bag/Given 11/26/23 0416)  cefTRIAXone  (ROCEPHIN ) 2 g in sodium chloride  0.9 % 100 mL IVPB (0 g Intravenous Stopped 11/26/23 0356)  morphine  (PF) 4 MG/ML injection 4 mg (4 mg Intravenous Given 11/26/23 0327)  ondansetron  (ZOFRAN ) injection 4 mg (4 mg Intravenous Given 11/26/23 0327)  lidocaine -prilocaine   (EMLA ) cream (1 Application Topical Given 11/26/23 0323)  lidocaine  (PF) (XYLOCAINE ) 1 % injection 5 mL (5 mLs Intradermal Given by Other 11/26/23 0415)  morphine  (PF) 4 MG/ML injection 4 mg (4 mg Intravenous Given 11/26/23 0438)     ED  COURSE: Labs show leukocytosis of 11,000.  Normal lactic.  Wound opened spontaneously and was able to drain a large amount of pus.  Wound culture sent.  Patient feeling better.  Received Rocephin , vancomycin  for broad coverage here.  I feel she is safe for discharge on Keflex , doxycycline  and continued Valtrex .  Discussed at length return precautions with patient and family.  Family member at bedside is a physician.  Will discharge with pain medication as well.  They verbalized understanding and are comfortable with this plan.   At this time, I do not feel there is any life-threatening condition present. I reviewed all nursing notes, vitals, pertinent previous records.  All lab and urine results, EKGs, imaging ordered have been independently reviewed and interpreted by myself.  I reviewed all available radiology reports from any imaging ordered this visit.  Based on my assessment, I feel the patient is safe to be discharged home without further emergent workup and can continue workup as an outpatient as needed. Discussed all findings, treatment plan as well as usual and customary return precautions.  They verbalize understanding and are comfortable with this plan.  Outpatient follow-up has been provided as needed.  All questions have been answered.    CONSULTS: Admission considered but workup reassuring, patient feeling better.  I feel she is appropriate for outpatient management.   OUTSIDE RECORDS REVIEWED: Reviewed family medicine note from yesterday.       FINAL CLINICAL IMPRESSION(S) / ED DIAGNOSES   Final diagnoses:  Cellulitis and abscess of head  Herpes zoster with other complication     Rx / DC Orders   ED Discharge Orders          Ordered     cephALEXin  (KEFLEX ) 500 MG capsule  4 times daily        11/26/23 0432    doxycycline  (VIBRA -TABS) 100 MG tablet  2 times daily        11/26/23 0432    ondansetron  (ZOFRAN -ODT) 4 MG disintegrating tablet  Every 6 hours PRN        11/26/23 0432    oxyCODONE  (ROXICODONE ) 5 MG immediate release tablet  Every 8 hours PRN        11/26/23 0436             Note:  This document was prepared using Dragon voice recognition software and may include unintentional dictation errors.   Kiah Keay, Josette SAILOR, DO 11/26/23 [phone]

## 2023-11-26 NOTE — ED Notes (Signed)
 Labs obtained before antibiotics were started

## 2023-11-26 NOTE — ED Notes (Addendum)
 Pt reports abscess to top of her head since 7/4, was draining, pt c/o pain down the L side of her face and swelling. Pt was seen at [facility] and started on Augmentin , pt states she took 800mg  ibuprofen before leaving to come here. Pt was seen recently for shingles which she has had in the past on the R side of her face. Pt currently on valtrex  for 10 days.

## 2023-11-26 NOTE — Discharge Instructions (Addendum)
 You are being provided a prescription for opiates (also known as narcotics) for pain control.  Opiates can be addictive and should only be used when absolutely necessary for pain control when other alternatives do not work.  We recommend you only use them for the recommended amount of time and only as prescribed.  Please do not take with other sedative medications or alcohol.  Please do not drive, operate machinery, make important decisions while taking opiates.  Please note that these medications can be addictive and have high abuse potential.  Patients can become addicted to narcotics after only taking them for a few days.  Please keep these medications locked away from children, teenagers or any family members with history of substance abuse.  Narcotic pain medicine may also make you constipated.  You may use over-the-counter medications such as MiraLAX , Colace to prevent constipation.  If you become constipated, you may use over-the-counter enemas as needed.  Itching and nausea are also common side effects of narcotic pain medication.  If you develop uncontrolled vomiting or a rash, please stop these medications and seek medical care.  Please stop your Augmentin  and begin taking cephalexin  and doxycycline  until complete.  You may clean this area daily with warm water and a gentle cleanser.  You may notice that it continues to drain or bleed slightly for the next few days which is normal.  Applying warm compresses to your scalp can help keep this area open and draining.  Please also continue your Valtrex  as prescribed.

## 2023-12-01 LAB — AEROBIC/ANAEROBIC CULTURE W GRAM STAIN (SURGICAL/DEEP WOUND)

## 2023-12-01 LAB — CULTURE, BLOOD (SINGLE)
Culture: NO GROWTH
Special Requests: ADEQUATE

## 2024-02-24 ENCOUNTER — Encounter: Payer: Self-pay | Admitting: Gastroenterology

## 2024-02-24 ENCOUNTER — Other Ambulatory Visit: Payer: Self-pay | Admitting: Internal Medicine

## 2024-02-24 NOTE — Anesthesia Preprocedure Evaluation (Signed)
 Anesthesia Evaluation  Patient identified by MRN, date of birth, ID band Patient awake    Reviewed: Allergy & Precautions, H&P , NPO status , Patient's Chart, lab work & pertinent test results  Airway Mallampati: III  TM Distance: <3 FB Neck ROM: Full    Dental no notable dental hx.  Patient removed nose piercing preop:   Pulmonary neg pulmonary ROS, sleep apnea    Pulmonary exam normal breath sounds clear to auscultation       Cardiovascular hypertension, Normal cardiovascular exam Rhythm:Regular Rate:Normal     Neuro/Psych  Headaches PSYCHIATRIC DISORDERS Anxiety Depression    negative neurological ROS  negative psych ROS   GI/Hepatic negative GI ROS, Neg liver ROS,GERD  ,,  Endo/Other  negative endocrine ROS    Renal/GU negative Renal ROS  negative genitourinary   Musculoskeletal negative musculoskeletal ROS (+)    Abdominal   Peds negative pediatric ROS (+)  Hematology negative hematology ROS (+)   Anesthesia Other Findings Shingles Hypertension Anxiety  Clostridium difficile diarrhea Acute appendicitis with localized peritonitis  C. difficile diarrhea Loss of weight  Hypokalemia Severe sepsis (HCC)  High cholesterol Acute intractable headache  Abdominal pain Lactic acidosis  Tachycardia History of Clostridium difficile colitis  History of kidney stones GERD (gastroesophageal reflux disease)  ADHD (attention deficit hyperactivity disorder) Depression      Reproductive/Obstetrics negative OB ROS                              Anesthesia Physical Anesthesia Plan  ASA: 2  Anesthesia Plan: General   Post-op Pain Management:    Induction: Intravenous  PONV Risk Score and Plan:   Airway Management Planned: Natural Airway and Nasal Cannula  Additional Equipment:   Intra-op Plan:   Post-operative Plan:   Informed Consent: I have reviewed the patients History and  Physical, chart, labs and discussed the procedure including the risks, benefits and alternatives for the proposed anesthesia with the patient or authorized representative who has indicated his/her understanding and acceptance.     Dental Advisory Given  Plan Discussed with: Anesthesiologist, CRNA and Surgeon  Anesthesia Plan Comments: (Patient consented for risks of anesthesia including but not limited to:  - adverse reactions to medications - risk of airway placement if required - damage to eyes, teeth, lips or other oral mucosa - nerve damage due to positioning  - sore throat or hoarseness - Damage to heart, brain, nerves, lungs, other parts of body or loss of life  Patient voiced understanding and assent.)        Anesthesia Quick Evaluation

## 2024-02-25 ENCOUNTER — Other Ambulatory Visit: Payer: Self-pay | Admitting: Internal Medicine

## 2024-02-25 DIAGNOSIS — K219 Gastro-esophageal reflux disease without esophagitis: Secondary | ICD-10-CM

## 2024-02-25 NOTE — Telephone Encounter (Signed)
 No longer undeprovider care Requested Prescriptions  Pending Prescriptions Disp Refills   etonogestrel -ethinyl estradiol  (NUVARING) 0.12-0.015 MG/24HR vaginal ring [Pharmacy Med Name: ETONOGESTREL /ETHINYL ESTRADIOL  ETHY EST RING]  5    Sig: USE VAGINALLY AS DIRECTED     OB/GYN:  Contraceptives Passed - 02/25/2024  4:28 PM      Passed - Last BP in normal range    BP Readings from Last 1 Encounters:  11/26/23 99/68         Passed - Valid encounter within last 12 months    Recent Outpatient Visits           7 months ago Flu-like symptoms   [facility], Leita DEL, MD              Passed - Patient is not a smoker

## 2024-02-26 ENCOUNTER — Other Ambulatory Visit: Payer: Self-pay

## 2024-02-26 ENCOUNTER — Encounter: Payer: Self-pay | Admitting: Gastroenterology

## 2024-02-26 ENCOUNTER — Ambulatory Visit: Payer: Self-pay | Admitting: Anesthesiology

## 2024-02-26 ENCOUNTER — Encounter: Admission: RE | Disposition: A | Payer: Self-pay | Source: Home / Self Care | Attending: Gastroenterology

## 2024-02-26 ENCOUNTER — Ambulatory Visit
Admission: RE | Admit: 2024-02-26 | Discharge: 2024-02-26 | Disposition: A | Discharge status: Home / Self Care | Attending: Gastroenterology | Admitting: Gastroenterology

## 2024-02-26 DIAGNOSIS — F32A Depression, unspecified: Secondary | ICD-10-CM | POA: Insufficient documentation

## 2024-02-26 DIAGNOSIS — K2100 Gastro-esophageal reflux disease with esophagitis, without bleeding: Secondary | ICD-10-CM | POA: Insufficient documentation

## 2024-02-26 DIAGNOSIS — Z79899 Other long term (current) drug therapy: Secondary | ICD-10-CM | POA: Insufficient documentation

## 2024-02-26 DIAGNOSIS — K219 Gastro-esophageal reflux disease without esophagitis: Secondary | ICD-10-CM | POA: Diagnosis present

## 2024-02-26 DIAGNOSIS — R1314 Dysphagia, pharyngoesophageal phase: Secondary | ICD-10-CM | POA: Insufficient documentation

## 2024-02-26 DIAGNOSIS — I10 Essential (primary) hypertension: Secondary | ICD-10-CM | POA: Insufficient documentation

## 2024-02-26 DIAGNOSIS — R519 Headache, unspecified: Secondary | ICD-10-CM | POA: Diagnosis not present

## 2024-02-26 DIAGNOSIS — R1319 Other dysphagia: Secondary | ICD-10-CM | POA: Diagnosis present

## 2024-02-26 DIAGNOSIS — G4730 Sleep apnea, unspecified: Secondary | ICD-10-CM | POA: Insufficient documentation

## 2024-02-26 DIAGNOSIS — Q402 Other specified congenital malformations of stomach: Secondary | ICD-10-CM | POA: Insufficient documentation

## 2024-02-26 DIAGNOSIS — F419 Anxiety disorder, unspecified: Secondary | ICD-10-CM | POA: Insufficient documentation

## 2024-02-26 HISTORY — DX: Gastro-esophageal reflux disease without esophagitis: K21.9

## 2024-02-26 HISTORY — DX: Personal history of urinary calculi: Z87.442

## 2024-02-26 HISTORY — DX: Depression, unspecified: F32.A

## 2024-02-26 HISTORY — DX: Attention-deficit hyperactivity disorder, unspecified type: F90.9

## 2024-02-26 HISTORY — PX: ESOPHAGOGASTRODUODENOSCOPY: SHX5428

## 2024-02-26 LAB — POCT PREGNANCY, URINE: Preg Test, Ur: NEGATIVE

## 2024-02-26 SURGERY — EGD (ESOPHAGOGASTRODUODENOSCOPY)
Anesthesia: General | Site: Mouth

## 2024-02-26 MED ORDER — PROPOFOL 10 MG/ML IV BOLUS
INTRAVENOUS | Status: DC | PRN
  Administered 2024-02-26: 160 ug/kg/min via INTRAVENOUS
  Administered 2024-02-26: 200 mg via INTRAVENOUS

## 2024-02-26 MED ORDER — LIDOCAINE HCL (CARDIAC) PF 100 MG/5ML IV SOSY
PREFILLED_SYRINGE | INTRAVENOUS | Status: DC | PRN
  Administered 2024-02-26: 100 mg via INTRAVENOUS

## 2024-02-26 MED ORDER — LACTATED RINGERS IV SOLN: INTRAVENOUS | Status: DC

## 2024-02-26 MED ORDER — STERILE WATER FOR IRRIGATION IR SOLN
Status: DC | PRN
  Administered 2024-02-26: 1000 mL

## 2024-02-26 MED ORDER — PANTOPRAZOLE SODIUM 40 MG PO TBEC: 40.0000 mg | DELAYED_RELEASE_TABLET | Freq: Two times a day (BID) | ORAL | 2 refills | Status: AC

## 2024-02-26 MED ORDER — PROPOFOL 10 MG/ML IV BOLUS
INTRAVENOUS | Status: AC
  Filled 2024-02-26: qty 40

## 2024-02-26 SURGICAL SUPPLY — 18 items
BALLN DILATOR ESOPH 8 10 CRE (MISCELLANEOUS) IMPLANT
BALLOON DILATOR 12-15 8 (BALLOONS) IMPLANT
BALLOON DILATOR 15-18 8 (BALLOONS) IMPLANT
BALLOON DILATOR CRE 0-12 8 (BALLOONS) IMPLANT
BLOCK BITE 60FR ADLT L/F GRN (MISCELLANEOUS) ×1 IMPLANT
CLIP HMST 235XBRD CATH ROT (MISCELLANEOUS) IMPLANT
ELECTRODE REM PT RTRN 9FT ADLT (ELECTROSURGICAL) IMPLANT
FORCEPS BIOP RAD 4 LRG CAP 4 (CUTTING FORCEPS) IMPLANT
GOWN CVR UNV OPN BCK APRN NK (MISCELLANEOUS) ×2 IMPLANT
INJECTOR VARIJECT VIN23 (MISCELLANEOUS) IMPLANT
KIT DEFENDO VALVE AND CONN (KITS) IMPLANT
KIT PRC NS LF DISP ENDO (KITS) ×1 IMPLANT
MANIFOLD NEPTUNE II (INSTRUMENTS) ×1 IMPLANT
MARKER SPOT ENDO TATTOO 5ML (MISCELLANEOUS) IMPLANT
RETRIEVER NET PLAT FOOD (MISCELLANEOUS) IMPLANT
SYR INFLATION 60ML (SYRINGE) IMPLANT
WATER STERILE IRR 250ML POUR (IV SOLUTION) ×1 IMPLANT
WIRE CRE 18-20MM 8CM F G (MISCELLANEOUS) IMPLANT

## 2024-02-26 NOTE — H&P (Signed)
 Corinn JONELLE Brooklyn, MD [facility], DHIP [address]  [place], [place] [address]  Main: [phone] Fax:  [phone] Pager: [phone]   Primary Care Physician:  Don Lauraine Collar, NP Primary Gastroenterologist:  Dr. Corinn JONELLE Brooklyn  Pre-Procedure History & Physical: HPI:  Molly Cisneros is a 38 y.o. female is here for an colonoscopy.   Past Medical History:  Diagnosis Date   Abdominal pain 10/18/2019   Acute appendicitis with localized peritonitis 11/06/2018   Acute intractable headache    ADHD (attention deficit hyperactivity disorder)    Anxiety    C. difficile diarrhea 02/05/2019   Clostridium difficile diarrhea    Depression    GERD (gastroesophageal reflux disease)    High cholesterol    History of Clostridium difficile colitis 07/16/2019   History of kidney stones    Hypertension    Hypokalemia 02/05/2019   Lactic acidosis 10/18/2019   Loss of weight    Severe sepsis (HCC) 02/05/2019   Shingles    reoccuring shingles on face   Tachycardia 02/12/2021    Past Surgical History:  Procedure Laterality Date   COLONOSCOPY WITH PROPOFOL  N/A 12/23/2018   Procedure: COLONOSCOPY WITH PROPOFOL ;  Surgeon: Brooklyn Corinn Skiff, MD;  Location: [facility] ENDOSCOPY;  Service: Gastroenterology;  Laterality: N/A;   ESOPHAGOGASTRODUODENOSCOPY (EGD) WITH PROPOFOL  N/A 12/23/2018   Procedure: ESOPHAGOGASTRODUODENOSCOPY (EGD) WITH PROPOFOL ;  Surgeon: Brooklyn Corinn Skiff, MD;  Location: [facility] ENDOSCOPY;  Service: Gastroenterology;  Laterality: N/A;   GIVENS CAPSULE STUDY N/A 06/14/2019   Procedure: GIVENS CAPSULE STUDY;  Surgeon: Brooklyn Corinn Skiff, MD;  Location: [facility] ENDOSCOPY;  Service: Gastroenterology;  Laterality: N/A;   GIVENS CAPSULE STUDY N/A 06/16/2019   Procedure: GIVENS CAPSULE STUDY;  Surgeon: Brooklyn Corinn Skiff, MD;  Location: [facility] ENDOSCOPY;  Service: Gastroenterology;  Laterality: N/A;   LAPAROSCOPIC APPENDECTOMY N/A 11/06/2018   Procedure:  APPENDECTOMY LAPAROSCOPIC;  Surgeon: Rodolph Romano, MD;  Location: [facility] ORS;  Service: General;  Laterality: N/A;    Prior to Admission medications   Medication Sig Start Date End Date Taking? Authorizing Provider  ALPRAZolam  (XANAX ) 1 MG tablet Take 1 mg by mouth 3 (three) times daily as needed for anxiety.    Yes [provider]  amLODipine  (NORVASC ) 10 MG tablet TAKE (1) TABLET BY MOUTH EVERY DAY 09/17/23  Yes Justus Leita DEL, MD  amphetamine -dextroamphetamine  (ADDERALL ) 20 MG tablet Take 20 mg by mouth 2 (two) times daily. 09/10/21  Yes [provider]  atorvastatin  (LIPITOR) 20 MG tablet TAKE (1) TABLET BY MOUTH EVERY DAY 03/11/23  Yes Justus Leita DEL, MD  cyclobenzaprine  (FLEXERIL ) 5 MG tablet TAKE (1) TABLET BY MOUTH THREE TIMES A DAY Patient taking differently: as needed. 03/27/20  Yes Justus Leita DEL, MD  DULoxetine  (CYMBALTA ) 60 MG capsule Take 60 mg by mouth daily.  07/18/15  Yes [provider]  hydrOXYzine  (VISTARIL ) 25 MG capsule TAKE ONE (1) TO TWO (2) CAPSULES BY MOUTH EVERY SIX (6) HOURS 07/23/23  Yes Justus Leita DEL, MD  olmesartan  (BENICAR ) 40 MG tablet Take 1 tablet (40 mg total) by mouth daily. 10/03/22  Yes Justus Leita DEL, MD  pantoprazole  (PROTONIX ) 40 MG tablet TAKE (1) TABLET BY MOUTH EVERY DAY 03/20/23  Yes Justus Leita DEL, MD  propranolol (INDERAL) 60 MG tablet Take 60 mg by mouth at bedtime.   Yes [provider]  EPINEPHrine  0.3 mg/0.3 mL IJ SOAJ injection Inject 0.3 mg into the muscle as needed for anaphylaxis. 01/20/23   Justus Leita  H, MD  etonogestrel -ethinyl estradiol  (NUVARING) 0.12-0.015 MG/24HR vaginal ring USE AS DIRECTED 01/03/23   Justus Leita DEL, MD  ondansetron  (ZOFRAN -ODT) 4 MG disintegrating tablet Take 1 tablet (4 mg total) by mouth every 6 (six) hours as needed for nausea or vomiting. 11/26/23   Ward, Josette SAILOR, DO  promethazine  (PHENERGAN ) 25 MG tablet Take 1 tablet (25 mg total) by mouth every 8 (eight)  hours as needed for nausea or vomiting. 07/10/23   Justus Leita DEL, MD  valACYclovir  (VALTREX ) 1000 MG tablet Take 1 tablet (1,000 mg total) by mouth 2 (two) times daily as needed. 04/06/23   Justus Leita DEL, MD    Allergies as of 02/11/2024 - Review Complete 11/25/2023  Allergen Reaction Noted   Shellfish allergy Anaphylaxis 05/30/2015   Aloe vera Dermatitis 11/06/2018   Latex  06/29/2017    Family History  Problem Relation Age of Onset   Non-Hodgkin's lymphoma Father 49       Basil Cell   Pancreatic cancer Maternal Grandmother 54   Thyroid  cancer Maternal Grandmother 46   Throat cancer Paternal Grandfather 48    Social History   Socioeconomic History   Marital status: Single    Spouse name: Not on file   Number of children: Not on file   Years of education: Not on file   Highest education level: Not on file  Occupational History   Not on file  Tobacco Use   Smoking status: Never   Smokeless tobacco: Never  Vaping Use   Vaping status: Never Used  Substance and Sexual Activity   Alcohol use: Yes    Alcohol/week: 2.0 standard drinks of alcohol    Types: 2 Cans of beer per week   Drug use: Never   Sexual activity: Not Currently    Birth control/protection: Inserts  Other Topics Concern   Not on file  Social History Narrative   Not on file   Social Drivers of Health   Financial Resource Strain: Low Risk  (08/07/2023)   Received from [facility]   Overall Financial Resource Strain (CARDIA)    Difficulty of Paying Living Expenses: Not hard at all  Food Insecurity: No Food Insecurity (08/07/2023)   Received from [facility]   Hunger Vital Sign    Within the past 12 months, you worried that your food would run out before you got the money to buy more.: Never true    Within the past 12 months, the food you bought just didn't last and you didn't have money to get more.: Never true  Transportation Needs: No Transportation Needs  (08/07/2023)   Received from [facility] - Transportation    In the past 12 months, has lack of transportation kept you from medical appointments or from getting medications?: No    Lack of Transportation (Non-Medical): No  Physical Activity: Not on file  Stress: Not on file  Social Connections: Not on file  Intimate Partner Violence: Not on file    Review of Systems: See HPI, otherwise negative ROS  Physical Exam: BP 118/80   Pulse 81   Temp (!) 97 F (36.1 C)   Resp 16   Ht 6' (1.829 m)   Wt 104.3 kg   LMP 02/20/2024 (Exact Date)   SpO2 99%   BMI 31.19 kg/m  General:   Alert,  pleasant and cooperative in NAD Head:  Normocephalic and atraumatic. Neck:  Supple; no masses or thyromegaly. Lungs:  Clear throughout to auscultation.    Heart:  Regular rate and rhythm. Abdomen:  Soft, nontender and nondistended. Normal bowel sounds, without guarding, and without rebound.   Neurologic:  Alert and  oriented x4;  grossly normal neurologically.  Impression/Plan: Molly Cisneros is here for an colonoscopy to be performed for dysphagia, chronic GERD   Risks, benefits, limitations, and alternatives regarding  endoscopy have been reviewed with the patient.  Questions have been answered.  All parties agreeable.   Corinn Brooklyn, MD  02/26/2024, 7:47 AM

## 2024-02-26 NOTE — Transfer of Care (Signed)
 Immediate Anesthesia Transfer of Care Note  Patient: Molly Cisneros  Procedure(s) Performed: EGD (ESOPHAGOGASTRODUODENOSCOPY)  Patient Location: PACU  Anesthesia Type: General  Level of Consciousness: awake, alert  and patient cooperative  Airway and Oxygen Therapy: Patient Spontanous Breathing   Post-op Assessment: Post-op Vital signs reviewed, Patient's Cardiovascular Status Stable, Respiratory Function Stable, Patent Airway and No signs of Nausea or vomiting  Post-op Vital Signs: Reviewed and stable  Complications: No notable events documented.

## 2024-02-26 NOTE — Op Note (Signed)
 [facility] Patient Name: Molly Cisneros Procedure Date: 02/26/2024 8:22 AM MRN: [id number] Account #: [id number] Date of Birth: 1985/09/03 Admit Type: Outpatient Age: 38 Room: [facility] OR ROOM 01 Gender: Female Note Status: Finalized Instrument Name: Endoscope [id number] Procedure:             Upper GI endoscopy Indications:           Esophageal dysphagia Providers:             Corinn Jess Brooklyn MD, MD Referring MD:          Lauraine LOIS Leak (Referring MD) Medicines:             General Anesthesia Complications:         No immediate complications. Estimated blood loss: None. Procedure:             Pre-Anesthesia Assessment:                        - Prior to the procedure, a History and Physical was                         performed, and patient medications and allergies were                         reviewed. The patient is competent. The risks and                         benefits of the procedure and the sedation options and                         risks were discussed with the patient. All questions                         were answered and informed consent was obtained.                         Patient identification and proposed procedure were                         verified by the physician, the nurse, the                         anesthesiologist, the anesthetist and the technician                         in the pre-procedure area in the procedure room in the                         endoscopy suite. Mental Status Examination: alert and                         oriented. Airway Examination: normal oropharyngeal                         airway and neck mobility. Respiratory Examination:                         clear to auscultation. CV Examination: normal.  Prophylactic Antibiotics: The patient does not require                         prophylactic antibiotics. Prior Anticoagulants: The                         patient has taken no  anticoagulant or antiplatelet                         agents. ASA Grade Assessment: II - A patient with mild                         systemic disease. After reviewing the risks and                         benefits, the patient was deemed in satisfactory                         condition to undergo the procedure. The anesthesia                         plan was to use general anesthesia. Immediately prior                         to administration of medications, the patient was                         re-assessed for adequacy to receive sedatives. The                         heart rate, respiratory rate, oxygen saturations,                         blood pressure, adequacy of pulmonary ventilation, and                         response to care were monitored throughout the                         procedure. The physical status of the patient was                         re-assessed after the procedure.                        After obtaining informed consent, the endoscope was                         passed under direct vision. Throughout the procedure,                         the patient's blood pressure, pulse, and oxygen                         saturations were monitored continuously. The Endoscope                         was introduced through the mouth, and advanced to the  second part of duodenum. The upper GI endoscopy was                         accomplished without difficulty. The patient tolerated                         the procedure well. Findings:      The duodenal bulb and second portion of the duodenum were normal.      The entire examined stomach was normal.      The cardia and gastric fundus were normal on retroflexion.      Esophagogastric landmarks were identified: the gastroesophageal junction       was found at 40 cm from the incisors.      LA Grade A (one or more mucosal breaks less than 5 mm, not extending       between tops of 2 mucosal folds)  esophagitis with no bleeding was found       at the gastroesophageal junction.      The examined esophagus was normal. Biopsies were taken with a cold       forceps for histology.      A single area of ectopic gastric mucosa was found in the upper third of       the esophagus. Impression:            - Normal duodenal bulb and second portion of the                         duodenum.                        - Normal stomach.                        - Esophagogastric landmarks identified.                        - LA Grade A reflux esophagitis with no bleeding.                        - Normal esophagus. Biopsied.                        - Ectopic gastric mucosa in the upper third of the                         esophagus. Recommendation:        - Await pathology results.                        - Discharge patient to home (with escort).                        - Resume previous diet today.                        - Continue present medications.                        - Follow an antireflux regimen.                        - Use  a proton pump inhibitor PO BID for 3 months. Procedure Code(s):     --- Professional ---                        [id number], Esophagogastroduodenoscopy, flexible,                         transoral; with biopsy, single or multiple Diagnosis Code(s):     --- Professional ---                        K21.00, Gastro-esophageal reflux disease with                         esophagitis, without bleeding                        Q40.2, Other specified congenital malformations of                         stomach                        R13.14, Dysphagia, pharyngoesophageal phase CPT copyright 2022 [employer]. All rights reserved. The codes documented in this report are preliminary and upon coder review may  be revised to meet current compliance requirements. Dr. Corinn Brooklyn Corinn Jess Brooklyn MD, MD 02/26/2024 8:41:37 AM This report has been signed electronically. Number  of Addenda: 0 Note Initiated On: 02/26/2024 8:22 AM Total Procedure Duration: 0 hours 4 minutes 34 seconds  Estimated Blood Loss:  Estimated blood loss: none. Estimated blood loss: none.      [facility]

## 2024-02-26 NOTE — Anesthesia Postprocedure Evaluation (Signed)
 Anesthesia Post Note  Patient: Kiyoko E Gouveia  Procedure(s) Performed: EGD (ESOPHAGOGASTRODUODENOSCOPY) (Mouth)  Patient location during evaluation: PACU Anesthesia Type: General Level of consciousness: awake and alert Pain management: pain level controlled Vital Signs Assessment: post-procedure vital signs reviewed and stable Respiratory status: spontaneous breathing, nonlabored ventilation, respiratory function stable and patient connected to nasal cannula oxygen Cardiovascular status: blood pressure returned to baseline and stable Postop Assessment: no apparent nausea or vomiting Anesthetic complications: no   No notable events documented.   Last Vitals:  Vitals:   02/26/24 0841 02/26/24 0845  BP: 112/86 (!) 135/101  Pulse: 77 74  Resp: (!) 22 18  Temp: (!) 36.2 C   SpO2: 95% 96%    Last Pain:  Vitals:   02/26/24 0845  PainSc: 0-No pain                 Didi Ganaway C Tamitha Norell

## 2024-02-29 NOTE — Telephone Encounter (Signed)
 Duplicate request, too son for refill.  Requested Prescriptions  Pending Prescriptions Disp Refills   pantoprazole  (PROTONIX ) 40 MG tablet [Pharmacy Med Name: PANTOPRAZOLE  SODIUM 40MG  TABLET DR] 30 tablet 5    Sig: TAKE (1) TABLET BY MOUTH EVERY DAY     Gastroenterology: Proton Pump Inhibitors Passed - 02/29/2024 12:11 PM      Passed - Valid encounter within last 12 months    Recent Outpatient Visits           7 months ago Flu-like symptoms   [facility], Leita DEL, MD

## 2024-03-01 LAB — SURGICAL PATHOLOGY

## 2024-03-02 ENCOUNTER — Ambulatory Visit: Payer: Self-pay | Admitting: Gastroenterology
